# Patient Record
Sex: Male | Born: 1945
Health system: Southern US, Community
[De-identification: ages and names within clinical notes are randomized; demographics above are authoritative.]

## PROBLEM LIST (undated history)

## (undated) DIAGNOSIS — J189 Pneumonia, unspecified organism: Secondary | ICD-10-CM

## (undated) DIAGNOSIS — I219 Acute myocardial infarction, unspecified: Secondary | ICD-10-CM

## (undated) DIAGNOSIS — E785 Hyperlipidemia, unspecified: Secondary | ICD-10-CM

## (undated) DIAGNOSIS — I2510 Atherosclerotic heart disease of native coronary artery without angina pectoris: Secondary | ICD-10-CM

## (undated) DIAGNOSIS — I509 Heart failure, unspecified: Secondary | ICD-10-CM

## (undated) DIAGNOSIS — I255 Ischemic cardiomyopathy: Secondary | ICD-10-CM

## (undated) DIAGNOSIS — M12811 Other specific arthropathies, not elsewhere classified, right shoulder: Secondary | ICD-10-CM

## (undated) DIAGNOSIS — I10 Essential (primary) hypertension: Secondary | ICD-10-CM

## (undated) DIAGNOSIS — M75101 Unspecified rotator cuff tear or rupture of right shoulder, not specified as traumatic: Secondary | ICD-10-CM

## (undated) DIAGNOSIS — M1990 Unspecified osteoarthritis, unspecified site: Secondary | ICD-10-CM

## (undated) DIAGNOSIS — M109 Gout, unspecified: Secondary | ICD-10-CM

## (undated) DIAGNOSIS — R0602 Shortness of breath: Secondary | ICD-10-CM

## (undated) HISTORY — PX: CORONARY STENT PLACEMENT: SHX1402

## (undated) HISTORY — PX: NO PAST SURGERIES: SHX2092

---

## 1967-01-20 HISTORY — PX: CYSTECTOMY: SUR359

## 2000-01-20 HISTORY — PX: OTHER SURGICAL HISTORY: SHX169

## 2000-09-14 ENCOUNTER — Ambulatory Visit (HOSPITAL_COMMUNITY): Admission: RE | Admit: 2000-09-14 | Discharge: 2000-09-14 | Payer: Self-pay | Admitting: Internal Medicine

## 2001-06-30 ENCOUNTER — Ambulatory Visit (HOSPITAL_COMMUNITY): Admission: RE | Admit: 2001-06-30 | Discharge: 2001-06-30 | Payer: Self-pay | Admitting: Orthopaedic Surgery

## 2001-07-14 ENCOUNTER — Ambulatory Visit (HOSPITAL_COMMUNITY): Admission: RE | Admit: 2001-07-14 | Discharge: 2001-07-14 | Payer: Self-pay | Admitting: Orthopaedic Surgery

## 2004-10-27 ENCOUNTER — Ambulatory Visit (HOSPITAL_COMMUNITY): Admission: RE | Admit: 2004-10-27 | Discharge: 2004-10-27 | Payer: Self-pay | Admitting: Pulmonary Disease

## 2004-10-28 ENCOUNTER — Ambulatory Visit (HOSPITAL_COMMUNITY): Admission: RE | Admit: 2004-10-28 | Discharge: 2004-10-28 | Payer: Self-pay | Admitting: Pulmonary Disease

## 2005-07-31 ENCOUNTER — Encounter: Admission: RE | Admit: 2005-07-31 | Discharge: 2005-07-31 | Payer: Self-pay | Admitting: Orthopedic Surgery

## 2010-12-31 ENCOUNTER — Other Ambulatory Visit (HOSPITAL_COMMUNITY): Payer: Self-pay | Admitting: Pulmonary Disease

## 2010-12-31 DIAGNOSIS — I729 Aneurysm of unspecified site: Secondary | ICD-10-CM

## 2011-01-05 ENCOUNTER — Other Ambulatory Visit (HOSPITAL_COMMUNITY): Payer: Self-pay | Admitting: Pulmonary Disease

## 2011-01-05 ENCOUNTER — Ambulatory Visit (HOSPITAL_COMMUNITY)
Admission: RE | Admit: 2011-01-05 | Discharge: 2011-01-05 | Disposition: A | Discharge status: Home / Self Care | Payer: BC Managed Care – PPO | Source: Ambulatory Visit | Attending: Pulmonary Disease | Admitting: Pulmonary Disease

## 2011-01-05 DIAGNOSIS — I714 Abdominal aortic aneurysm, without rupture, unspecified: Secondary | ICD-10-CM | POA: Insufficient documentation

## 2011-01-05 DIAGNOSIS — I729 Aneurysm of unspecified site: Secondary | ICD-10-CM

## 2011-08-20 DIAGNOSIS — I219 Acute myocardial infarction, unspecified: Secondary | ICD-10-CM

## 2011-08-20 HISTORY — DX: Acute myocardial infarction, unspecified: I21.9

## 2011-08-23 ENCOUNTER — Encounter (HOSPITAL_COMMUNITY)
Admission: EM | Disposition: A | Discharge status: Home / Self Care | Payer: Self-pay | Source: Home / Self Care | Attending: Cardiovascular Disease

## 2011-08-23 ENCOUNTER — Encounter (HOSPITAL_COMMUNITY): Payer: Self-pay | Admitting: *Deleted

## 2011-08-23 ENCOUNTER — Other Ambulatory Visit: Payer: Self-pay

## 2011-08-23 ENCOUNTER — Inpatient Hospital Stay (HOSPITAL_COMMUNITY)
Admission: EM | Admit: 2011-08-23 | Discharge: 2011-08-27 | DRG: 550 | Disposition: A | Discharge status: Home / Self Care | Payer: BC Managed Care – PPO | Attending: Cardiovascular Disease | Admitting: Cardiovascular Disease

## 2011-08-23 ENCOUNTER — Emergency Department (HOSPITAL_COMMUNITY): Payer: BC Managed Care – PPO

## 2011-08-23 ENCOUNTER — Encounter (HOSPITAL_COMMUNITY): Payer: Self-pay

## 2011-08-23 ENCOUNTER — Ambulatory Visit (HOSPITAL_COMMUNITY): Admit: 2011-08-23 | Payer: Self-pay | Admitting: Cardiovascular Disease

## 2011-08-23 DIAGNOSIS — I2119 ST elevation (STEMI) myocardial infarction involving other coronary artery of inferior wall: Secondary | ICD-10-CM

## 2011-08-23 DIAGNOSIS — I10 Essential (primary) hypertension: Secondary | ICD-10-CM | POA: Diagnosis present

## 2011-08-23 DIAGNOSIS — I2589 Other forms of chronic ischemic heart disease: Secondary | ICD-10-CM | POA: Diagnosis present

## 2011-08-23 DIAGNOSIS — Z7982 Long term (current) use of aspirin: Secondary | ICD-10-CM

## 2011-08-23 DIAGNOSIS — I2510 Atherosclerotic heart disease of native coronary artery without angina pectoris: Secondary | ICD-10-CM

## 2011-08-23 DIAGNOSIS — I213 ST elevation (STEMI) myocardial infarction of unspecified site: Secondary | ICD-10-CM

## 2011-08-23 DIAGNOSIS — Z87891 Personal history of nicotine dependence: Secondary | ICD-10-CM

## 2011-08-23 DIAGNOSIS — M109 Gout, unspecified: Secondary | ICD-10-CM | POA: Diagnosis present

## 2011-08-23 DIAGNOSIS — Z955 Presence of coronary angioplasty implant and graft: Secondary | ICD-10-CM

## 2011-08-23 DIAGNOSIS — I252 Old myocardial infarction: Secondary | ICD-10-CM | POA: Diagnosis present

## 2011-08-23 DIAGNOSIS — I2109 ST elevation (STEMI) myocardial infarction involving other coronary artery of anterior wall: Principal | ICD-10-CM | POA: Diagnosis present

## 2011-08-23 DIAGNOSIS — Z79899 Other long term (current) drug therapy: Secondary | ICD-10-CM

## 2011-08-23 DIAGNOSIS — E785 Hyperlipidemia, unspecified: Secondary | ICD-10-CM | POA: Diagnosis present

## 2011-08-23 DIAGNOSIS — J189 Pneumonia, unspecified organism: Secondary | ICD-10-CM | POA: Diagnosis not present

## 2011-08-23 HISTORY — DX: Hyperlipidemia, unspecified: E78.5

## 2011-08-23 HISTORY — DX: Gout, unspecified: M10.9

## 2011-08-23 HISTORY — DX: Ischemic cardiomyopathy: I25.5

## 2011-08-23 HISTORY — PX: PERCUTANEOUS CORONARY STENT INTERVENTION (PCI-S): SHX5485

## 2011-08-23 HISTORY — DX: Atherosclerotic heart disease of native coronary artery without angina pectoris: I25.10

## 2011-08-23 HISTORY — DX: Essential (primary) hypertension: I10

## 2011-08-23 HISTORY — PX: LEFT HEART CATH: SHX5478

## 2011-08-23 HISTORY — DX: Pneumonia, unspecified organism: J18.9

## 2011-08-23 LAB — CARDIAC PANEL(CRET KIN+CKTOT+MB+TROPI)
CK, MB: 250 ng/mL (ref 0.3–4.0)
CK, MB: 244.2 ng/mL (ref 0.3–4.0)
Relative Index: 5.1 — ABNORMAL HIGH (ref 0.0–2.5)
Total CK: 5484 U/L — ABNORMAL HIGH (ref 7–232)
Total CK: 4829 U/L — ABNORMAL HIGH (ref 7–232)
Troponin I: >20 ng/mL

## 2011-08-23 LAB — CBC WITH DIFFERENTIAL/PLATELET
Basophils Absolute: 0 10*3/uL (ref 0.0–0.1)
Eosinophils Absolute: 0 10*3/uL (ref 0.0–0.7)
HCT: 45.2 % (ref 39.0–52.0)
Lymphs Abs: 0.9 10*3/uL (ref 0.7–4.0)
MCH: 32.1 pg (ref 26.0–34.0)
MCHC: 35.2 g/dL (ref 30.0–36.0)
MCV: 91.1 fL (ref 78.0–100.0)
Monocytes Absolute: 0.9 10*3/uL (ref 0.1–1.0)
Monocytes Relative: 7 % (ref 3–12)
Neutro Abs: 11.3 10*3/uL — ABNORMAL HIGH (ref 1.7–7.7)
Platelets: 143 10*3/uL — ABNORMAL LOW (ref 150–400)
RDW: 13.2 % (ref 11.5–15.5)
WBC: 13.1 10*3/uL — ABNORMAL HIGH (ref 4.0–10.5)

## 2011-08-23 LAB — COMPREHENSIVE METABOLIC PANEL WITH GFR
ALT: 88 U/L — ABNORMAL HIGH (ref 0–53)
AST: 354 U/L — ABNORMAL HIGH (ref 0–37)
Albumin: 3.7 g/dL (ref 3.5–5.2)
Alkaline Phosphatase: 77 U/L (ref 39–117)
BUN: 17 mg/dL (ref 6–23)
CO2: 23 meq/L (ref 19–32)
Calcium: 8.2 mg/dL — ABNORMAL LOW (ref 8.4–10.5)
Chloride: 104 meq/L (ref 96–112)
Creatinine, Ser: 0.87 mg/dL (ref 0.50–1.35)
GFR calc Af Amer: >90 mL/min
GFR calc non Af Amer: 89 mL/min — ABNORMAL LOW
Glucose, Bld: 171 mg/dL — ABNORMAL HIGH (ref 70–99)
Potassium: 3.9 meq/L (ref 3.5–5.1)
Sodium: 138 meq/L (ref 135–145)
Total Bilirubin: 0.7 mg/dL (ref 0.3–1.2)
Total Protein: 6.4 g/dL (ref 6.0–8.3)

## 2011-08-23 LAB — BASIC METABOLIC PANEL
BUN: 18 mg/dL (ref 6–23)
CO2: 25 mEq/L (ref 19–32)
Calcium: 9 mg/dL (ref 8.4–10.5)
Creatinine, Ser: 1.06 mg/dL (ref 0.50–1.35)
GFR calc non Af Amer: 72 mL/min — ABNORMAL LOW (ref >90)
Glucose, Bld: 123 mg/dL — ABNORMAL HIGH (ref 70–99)
Potassium: 3.7 mEq/L (ref 3.5–5.1)
Sodium: 138 mEq/L (ref 135–145)

## 2011-08-23 LAB — PROTIME-INR
INR: 1.32 (ref 0.00–1.49)
Prothrombin Time: 16.6 s — ABNORMAL HIGH (ref 11.6–15.2)

## 2011-08-23 LAB — CBC
HCT: 46 % (ref 39.0–52.0)
Hemoglobin: 16.5 g/dL (ref 13.0–17.0)
MCH: 32.8 pg (ref 26.0–34.0)
MCHC: 35.9 g/dL (ref 30.0–36.0)
MCV: 91.5 fL (ref 78.0–100.0)
Platelets: 153 10*3/uL (ref 150–400)
RBC: 5.03 MIL/uL (ref 4.22–5.81)
RDW: 13.2 % (ref 11.5–15.5)
WBC: 8 10*3/uL (ref 4.0–10.5)

## 2011-08-23 LAB — MRSA PCR SCREENING: MRSA by PCR: POSITIVE — AB

## 2011-08-23 LAB — HEMOGLOBIN A1C
Hgb A1c MFr Bld: 5.4 % (ref <5.7)
Mean Plasma Glucose: 108 mg/dL (ref <117)

## 2011-08-23 LAB — POCT I-STAT TROPONIN I

## 2011-08-23 LAB — TSH: TSH: 1.103 u[IU]/mL (ref 0.350–4.500)

## 2011-08-23 SURGERY — LEFT HEART CATH
Anesthesia: LOCAL

## 2011-08-23 MED ORDER — METOPROLOL TARTRATE 50 MG PO TABS
25.0000 mg | ORAL_TABLET | Freq: Once | ORAL | Status: AC
  Administered 2011-08-23: 25 mg via ORAL
  Filled 2011-08-23: qty 1

## 2011-08-23 MED ORDER — FENTANYL CITRATE 0.05 MG/ML IJ SOLN
INTRAMUSCULAR | Status: AC
  Filled 2011-08-23: qty 2

## 2011-08-23 MED ORDER — ATORVASTATIN CALCIUM 40 MG PO TABS: 40.0000 mg | ORAL_TABLET | Freq: Every day | ORAL | Status: DC

## 2011-08-23 MED ORDER — ALLOPURINOL 300 MG PO TABS
300.0000 mg | ORAL_TABLET | Freq: Every day | ORAL | Status: DC
  Administered 2011-08-23 – 2011-08-27 (×5): 300 mg via ORAL
  Filled 2011-08-23 (×5): qty 1

## 2011-08-23 MED ORDER — HEPARIN (PORCINE) IN NACL 2-0.9 UNIT/ML-% IJ SOLN
INTRAMUSCULAR | Status: AC
  Filled 2011-08-23: qty 2000

## 2011-08-23 MED ORDER — MIDAZOLAM HCL 2 MG/2ML IJ SOLN
INTRAMUSCULAR | Status: AC
  Filled 2011-08-23: qty 2

## 2011-08-23 MED ORDER — ONDANSETRON HCL 4 MG/2ML IJ SOLN: 4.0000 mg | Freq: Four times a day (QID) | INTRAMUSCULAR | Status: DC | PRN

## 2011-08-23 MED ORDER — LISINOPRIL 10 MG PO TABS
10.0000 mg | ORAL_TABLET | Freq: Every day | ORAL | Status: DC
  Administered 2011-08-23 – 2011-08-27 (×5): 10 mg via ORAL
  Filled 2011-08-23 (×5): qty 1

## 2011-08-23 MED ORDER — CARVEDILOL 3.125 MG PO TABS
3.1250 mg | ORAL_TABLET | Freq: Two times a day (BID) | ORAL | Status: DC
  Administered 2011-08-23 – 2011-08-27 (×8): 3.125 mg via ORAL
  Filled 2011-08-23 (×10): qty 1

## 2011-08-23 MED ORDER — MORPHINE SULFATE 2 MG/ML IJ SOLN
2.0000 mg | Freq: Once | INTRAMUSCULAR | Status: AC
  Administered 2011-08-23: 2 mg via INTRAVENOUS

## 2011-08-23 MED ORDER — CHLORHEXIDINE GLUCONATE CLOTH 2 % EX PADS
6.0000 | MEDICATED_PAD | Freq: Every day | CUTANEOUS | Status: DC
  Administered 2011-08-24 – 2011-08-27 (×4): 6 via TOPICAL

## 2011-08-23 MED ORDER — ASPIRIN 325 MG PO TABS: 325.0000 mg | ORAL_TABLET | Freq: Every day | ORAL | Status: DC

## 2011-08-23 MED ORDER — NITROGLYCERIN IN D5W 200-5 MCG/ML-% IV SOLN
5.0000 ug/min | Freq: Once | INTRAVENOUS | Status: AC
  Administered 2011-08-23: 05:00:00 via INTRAVENOUS

## 2011-08-23 MED ORDER — ACETAMINOPHEN 325 MG PO TABS: 650.0000 mg | ORAL_TABLET | ORAL | Status: DC | PRN

## 2011-08-23 MED ORDER — MORPHINE SULFATE 2 MG/ML IJ SOLN
INTRAMUSCULAR | Status: AC
  Filled 2011-08-23: qty 1

## 2011-08-23 MED ORDER — HEPARIN BOLUS VIA INFUSION
4000.0000 [IU] | Freq: Once | INTRAVENOUS | Status: AC
  Administered 2011-08-23: 4000 [IU] via INTRAVENOUS

## 2011-08-23 MED ORDER — ONDANSETRON HCL 4 MG/2ML IJ SOLN
INTRAMUSCULAR | Status: AC
  Administered 2011-08-23: 4 mg
  Filled 2011-08-23: qty 2

## 2011-08-23 MED ORDER — NITROGLYCERIN 0.4 MG SL SUBL
0.4000 mg | SUBLINGUAL_TABLET | SUBLINGUAL | Status: DC | PRN
  Administered 2011-08-23 (×2): 0.4 mg via SUBLINGUAL
  Filled 2011-08-23: qty 25

## 2011-08-23 MED ORDER — PRASUGREL HCL 10 MG PO TABS
10.0000 mg | ORAL_TABLET | Freq: Every day | ORAL | Status: DC
  Administered 2011-08-23 – 2011-08-27 (×5): 10 mg via ORAL
  Filled 2011-08-23 (×5): qty 1

## 2011-08-23 MED ORDER — SODIUM CHLORIDE 0.45 % IV SOLN
INTRAVENOUS | Status: AC
  Administered 2011-08-23: 12:00:00 via INTRAVENOUS

## 2011-08-23 MED ORDER — ASPIRIN 81 MG PO CHEW
324.0000 mg | CHEWABLE_TABLET | Freq: Once | ORAL | Status: AC
  Administered 2011-08-23: 324 mg via ORAL
  Filled 2011-08-23: qty 4

## 2011-08-23 MED ORDER — MORPHINE SULFATE 2 MG/ML IJ SOLN
INTRAMUSCULAR | Status: AC
  Administered 2011-08-23: 2 mg via INTRAVENOUS
  Filled 2011-08-23: qty 1

## 2011-08-23 MED ORDER — MUPIROCIN 2 % EX OINT
1.0000 "application " | TOPICAL_OINTMENT | Freq: Two times a day (BID) | CUTANEOUS | Status: DC
  Administered 2011-08-24 – 2011-08-26 (×7): 1 via NASAL
  Filled 2011-08-23: qty 22

## 2011-08-23 MED ORDER — PRASUGREL HCL 10 MG PO TABS
ORAL_TABLET | ORAL | Status: AC
  Administered 2011-08-23: 10 mg via ORAL
  Filled 2011-08-23: qty 6

## 2011-08-23 MED ORDER — LIDOCAINE HCL (PF) 1 % IJ SOLN
INTRAMUSCULAR | Status: AC
  Filled 2011-08-23: qty 30

## 2011-08-23 MED ORDER — ONDANSETRON HCL 4 MG/2ML IJ SOLN
INTRAMUSCULAR | Status: AC
  Administered 2011-08-23: 4 mg via INTRAVENOUS
  Filled 2011-08-23: qty 2

## 2011-08-23 MED ORDER — NITROGLYCERIN 0.4 MG SL SUBL: 0.4000 mg | SUBLINGUAL_TABLET | SUBLINGUAL | Status: DC | PRN

## 2011-08-23 MED ORDER — ACETAMINOPHEN 325 MG PO TABS
650.0000 mg | ORAL_TABLET | ORAL | Status: DC | PRN
  Administered 2011-08-23 – 2011-08-26 (×5): 650 mg via ORAL
  Filled 2011-08-23 (×5): qty 2

## 2011-08-23 MED ORDER — NITROGLYCERIN 0.2 MG/ML ON CALL CATH LAB
INTRAVENOUS | Status: AC
  Filled 2011-08-23: qty 1

## 2011-08-23 MED ORDER — NITROGLYCERIN IN D5W 200-5 MCG/ML-% IV SOLN
INTRAVENOUS | Status: AC
  Filled 2011-08-23: qty 250

## 2011-08-23 MED ORDER — NITROGLYCERIN 0.4 MG SL SUBL
SUBLINGUAL_TABLET | SUBLINGUAL | Status: AC
  Administered 2011-08-23: 0.4 mg via SUBLINGUAL
  Filled 2011-08-23: qty 25

## 2011-08-23 MED ORDER — CLOPIDOGREL BISULFATE 300 MG PO TABS
300.0000 mg | ORAL_TABLET | Freq: Once | ORAL | Status: AC
  Administered 2011-08-23: 300 mg via ORAL
  Filled 2011-08-23: qty 1

## 2011-08-23 MED ORDER — ATORVASTATIN CALCIUM 80 MG PO TABS
80.0000 mg | ORAL_TABLET | Freq: Every day | ORAL | Status: DC
Start: 2011-08-23 — End: 2011-08-24
  Administered 2011-08-23: 80 mg via ORAL
  Filled 2011-08-23 (×2): qty 1

## 2011-08-23 MED ORDER — HEPARIN (PORCINE) IN NACL 100-0.45 UNIT/ML-% IJ SOLN
10.0000 [IU]/kg/h | Freq: Once | INTRAMUSCULAR | Status: AC
  Administered 2011-08-23: 10 [IU]/kg/h via INTRAVENOUS
  Filled 2011-08-23: qty 250

## 2011-08-23 MED ORDER — BIVALIRUDIN 250 MG IV SOLR
INTRAVENOUS | Status: AC
  Filled 2011-08-23: qty 250

## 2011-08-23 MED ORDER — ASPIRIN 81 MG PO CHEW
81.0000 mg | CHEWABLE_TABLET | Freq: Every day | ORAL | Status: DC
  Administered 2011-08-23 – 2011-08-27 (×5): 81 mg via ORAL
  Filled 2011-08-23 (×4): qty 1

## 2011-08-23 MED ORDER — ONDANSETRON HCL 4 MG/2ML IJ SOLN
4.0000 mg | Freq: Four times a day (QID) | INTRAMUSCULAR | Status: DC | PRN
  Administered 2011-08-23: 4 mg via INTRAVENOUS
  Filled 2011-08-23: qty 2

## 2011-08-23 NOTE — ED Provider Notes (Addendum)
History     CSN: [id number]  Arrival date & time 08/23/11  0445   First MD Initiated Contact with Patient 08/23/11 [phone]      Chief Complaint  Patient presents with  . Chest Pain    (Consider location/radiation/quality/duration/timing/severity/associated sxs/prior treatment) HPI Austin Bright is a 66 y.o. male who presents to the Emergency Department complaining of chest pain that radiates to his neck then to his head that has been present for a month with exertion. With rest it would subside. In the last two weeks pain with minimal exertion. Tonight went to bed at 2300. Every two hours pain would come, upper chest with radiation to neck and head. Each time seemed more severe. Last ASA taken yesterday morning.   PCP Dr. Juanetta Gosling   Past Medical History  Diagnosis Date  . Gout     History reviewed. No pertinent past surgical history.  History reviewed. No pertinent family history.  History  Substance Use Topics  . Smoking status: Former Games developer  . Smokeless tobacco: Not on file  . Alcohol Use: Yes     occasional      Review of Systems  Constitutional: Negative for fever.       10 Systems reviewed and are negative for acute change except as noted in the HPI.  HENT: Negative for congestion.   Eyes: Negative for discharge and redness.  Respiratory: Negative for cough and shortness of breath.   Cardiovascular: Positive for chest pain.  Gastrointestinal: Negative for vomiting and abdominal pain.  Musculoskeletal: Negative for back pain.  Skin: Negative for rash.  Neurological: Negative for syncope, numbness and headaches.  Psychiatric/Behavioral:       No behavior change.    Allergies  Review of patient's allergies indicates no known allergies.  Home Medications   Current Outpatient Rx  Name Route Sig Dispense Refill  . ALLOPURINOL 300 MG PO TABS Oral Take 300 mg by mouth daily.    . ASPIRIN 325 MG PO TABS Oral Take 325 mg by mouth daily.      BP 165/107   Temp 98 F (36.7 C) (Oral)  Resp 18  Ht 5\' 10"  (1.778 m)  Wt 220 lb (99.791 kg)  BMI 31.57 kg/m2  SpO2 97%  Physical Exam  Nursing note and vitals reviewed. Constitutional: He is oriented to person, place, and time. He appears well-developed and well-nourished. No distress.       Awake, alert, nontoxic appearance.  HENT:  Head: Normocephalic and atraumatic.  Right Ear: External ear normal.  Left Ear: External ear normal.  Mouth/Throat: Oropharynx is clear and moist.  Eyes: Conjunctivae and EOM are normal. Pupils are equal, round, and reactive to light. Right eye exhibits no discharge. Left eye exhibits no discharge.  Neck: Normal range of motion. Neck supple. No JVD present.  Cardiovascular: Normal rate and normal heart sounds.   Pulmonary/Chest: Effort normal and breath sounds normal. He exhibits no tenderness.  Abdominal: Soft. Bowel sounds are normal. There is no tenderness. There is no rebound.  Musculoskeletal: Normal range of motion. He exhibits no tenderness.       Baseline ROM, no obvious new focal weakness.  Neurological: He is alert and oriented to person, place, and time.       Mental status and motor strength appears baseline for patient and situation.  Skin: No rash noted.  Psychiatric: He has a normal mood and affect.    ED Course  Procedures (including critical care time)  Results for  orders placed during the hospital encounter of 08/23/11  CBC      Component Value Range   WBC 8.0  4.0 - 10.5 K/uL   RBC 5.03  4.22 - 5.81 MIL/uL   Hemoglobin 16.5  13.0 - 17.0 g/dL   HCT 16.1  09.6 - 04.5 %   MCV 91.5  78.0 - 100.0 fL   MCH 32.8  26.0 - 34.0 pg   MCHC 35.9  30.0 - 36.0 g/dL   RDW 40.9  81.1 - 91.4 %   Platelets 153  150 - 400 K/uL  BASIC METABOLIC PANEL      Component Value Range   Sodium 138  135 - 145 mEq/L   Potassium 3.7  3.5 - 5.1 mEq/L   Chloride 101  96 - 112 mEq/L   CO2 25  19 - 32 mEq/L   Glucose, Bld 123 (*) 70 - 99 mg/dL   BUN 18  6 - 23  mg/dL   Creatinine, Ser 7.82  0.50 - 1.35 mg/dL   Calcium 9.0  8.4 - 95.6 mg/dL   GFR calc non Af Amer 72 (*) >90 mL/min   GFR calc Af Amer 84 (*) >90 mL/min  TROPONIN I      Component Value Range   Troponin I <0.30  <0.30 ng/mL  POCT I-STAT TROPONIN I      Component Value Range   Troponin i, poc 0.07  0.00 - 0.08 ng/mL   Comment 3             Chest Portable 1 View  08/23/2011  *RADIOLOGY REPORT*  Clinical Data: Upper chest pain radiating into the head.  PORTABLE CHEST - 1 VIEW  Comparison: None.  Findings: Mild cardiac enlargement.  Pulmonary vascularity is normal but there is interstitial change throughout the lungs which could represent fibrosis or edema.  No focal consolidation.  No blunting of costophrenic angles.  No pneumothorax.  Calcification of the aorta.  IMPRESSION: Diffuse interstitial process suggesting either chronic fibrosis or acute edema.  Mild cardiac enlargement.  Original Report Authenticated By: Marlon Pel, M.D.   Date: 08/23/2011 0447  Rate: 72  Rhythm: normal sinus rhythm  QRS Axis: normal  Intervals: normal  ST/T Wave abnormalities: ST elevations anteriorly  Conduction Disutrbances:left anterior fascicular block  Narrative Interpretation:   Old EKG Reviewed: none available  0504 Code STEMI called 0510  T/C toDr. Kirke Corin, cardiologist, case discussed, including:  HPI, pertinent PM/SHx, VS/PE, dx testing, ED course and treatment.  Agreeable to transfer directly to the cath lab.Marland Kitchen    MDM  Patient with exertional angina for a month developed at rest angina tonight. EKG with ST elevated in anterior leads. Initiated Code STEMI protocol. IV NTG, Heparin ordered, ASA given, metoprolol and plaxix ordered, morphine for pain. Spoke with Dr. Kirke Corin, cardiologist who will accept the patient in transfer to [facility].Pt stable in ED with no significant deterioration in condition. The patient appears reasonably stabilized for transfer considering the current resources, flow, and  capabilities available in the ED at this time, and I doubt any other [facility] requiring further screening and/or treatment in the ED prior to transfer.  MDM Reviewed: nursing note and vitals Interpretation: ECG and x-ray Total time providing critical care: 30 minutes. Consults: cardiology           Nicoletta Dress. Colon Branch, MD 08/23/11 2130  Nicoletta Dress. Colon Branch, MD 08/23/11 [phone]

## 2011-08-23 NOTE — ED Notes (Signed)
Report given to [facility]  

## 2011-08-23 NOTE — ED Notes (Signed)
Pt reporting pain in upper mid portion of chest into head.  Denies nausea or vomiting. Reports symptoms have been intermittent for about 1 week, worse tonight.

## 2011-08-23 NOTE — H&P (Signed)
Admit date: 08/23/2011 Referring Physician  Dr. Colon Branch Primary Cardiologist  None Chief complaint/reason for admission: Chest pain  HPI:  Austin Bright is a 66 y.o. male who presented to the Emergency Department complaining of chest pain that radiates to his neck then to his head that has been occuring intermittently for a month with exertion. With rest it would subside. In the last two weeks he has had  pain with minimal exertion. Tonight he went to bed at 2300. Every two hours pain would come, upper chest with radiation to neck and head. Each time seemed more severe.  The pain then became unremitting around 4am awakening him from sleep.   In the ER he was noted to have anterior ST elevation and is now transferred to [facility] for emergent cath.   PMH:    Past Medical History  Diagnosis Date   Hypertension   . Gout      PSH:   History reviewed. No pertinent past surgical history.  ALLERGIES:   Review of patient's allergies indicates no known allergies.  Prior to Admit Meds:   Prescriptions prior to admission  Medication Sig Dispense Refill  . allopurinol (ZYLOPRIM) 300 MG tablet Take 300 mg by mouth daily.      Marland Kitchen aspirin 325 MG tablet Take 325 mg by mouth daily.       Family HX:   History reviewed. No pertinent family history. Social HX:    History   Social History  . Marital Status: Divorced    Spouse Name: N/A    Number of Children: N/A  . Years of Education: N/A   Occupational History  . Not on file.   Social History Main Topics  . Smoking status: Former Games developer  . Smokeless tobacco: Not on file  . Alcohol Use: Yes     occasional  . Drug Use: No  . Sexually Active:    Other Topics Concern  . Not on file   Social History Narrative  . No narrative on file     ROS:  All 11 ROS were addressed and are negative except what is stated in the HPI  PHYSICAL EXAM Filed Vitals:   08/23/11 0544  BP: 146/97  Temp:   Resp:    General: Well developed, well nourished, in no  acute distress Head: Eyes PERRLA, No xanthomas.   Normal cephalic and atramatic  Lungs:   Clear bilaterally to auscultation and percussion. Heart:   HRRR S1 S2 Pulses are 2+ & equal.            No carotid bruit. No JVD.  No abdominal bruits. No femoral bruits. Abdomen: Bowel sounds are positive, abdomen soft and non-tender without masses  Extremities:   No clubbing, cyanosis or edema.  DP +1 Neuro: Alert and oriented X 3. Psych:  Good affect, responds appropriately   Labs:   Lab Results  Component Value Date   WBC 8.0 08/23/2011   HGB 16.5 08/23/2011   HCT 46.0 08/23/2011   MCV 91.5 08/23/2011   PLT 153 08/23/2011    Lab 08/23/11 0458  NA 138  K 3.7  CL 101  CO2 25  BUN 18  CREATININE 1.06  CALCIUM 9.0  PROT --  BILITOT --  ALKPHOS --  ALT --  AST --  GLUCOSE 123*   Lab Results  Component Value Date   TROPONINI <0.30 08/23/2011     Radiology:  *RADIOLOGY REPORT*  Clinical Data: Upper chest pain radiating into the head.  PORTABLE CHEST - 1 VIEW  Comparison: None.  Findings: Mild cardiac enlargement. Pulmonary vascularity is  normal but there is interstitial change throughout the lungs which  could represent fibrosis or edema. No focal consolidation. No  blunting of costophrenic angles. No pneumothorax. Calcification  of the aorta.  IMPRESSION:  Diffuse interstitial process suggesting either chronic fibrosis or  acute edema. Mild cardiac enlargement.  Original Report Authenticated By: Marlon Pel, M.D.   EKG:  NSR with 3mm ST elevation in V2-V4 and 1mm ST elevation in I and aVL   ASSESSMENT:  1.  Acute STEMI - anterior 2.  HTN 3.  Gout  PLAN:   1.  Admit to CCU 2.  Emergent cardiac Cath by Dr. Chinita Pester, MD  08/23/2011  6:21 AM

## 2011-08-23 NOTE — ED Notes (Signed)
[facility] to department to transport pt.  

## 2011-08-23 NOTE — CV Procedure (Signed)
Cardiac Catheterization Procedure Note  Name: Austin Bright MRN: [id number] DOB: 03-08-1945  Procedure: Left Heart Cath, Selective Coronary Angiography, LV angiography,  thrombectomy /PTCA/Stent of mid LAD with a drug-eluting stent.  Indication: Inferior ST elevation myocardial infarction. This is a 66 year old male with no previous cardiac history. He presented with chest pain that woke him up from sleep at 4:00 this morning. He was noted to have anterior ST elevation on his ECG and was transferred for emergent cardiac catheterization. He reports exertional chest discomfort over the last 10 days.   Medications:  Sedation:  1 mg IV Versed, 125 mcg IV Fentanyl  Contrast:  220 ml Omnipaque  Diagnostic Procedure Details: The right groin was prepped, draped, and anesthetized with 1% lidocaine. Using the modified Seldinger technique, a 6 French sheath was introduced into the right femoral artery. I elected to start with interventional procedure on started with the guiding catheter. After the PCI, right coronary angiography was performed with a JR 4 catheter and left ventricular angiography was performed with a pigtail catheter.  Catheter exchanges were performed over a wire.    PCI Procedure Note:   Weight-based bivalirudin was given for anticoagulation. Effient 60 mg was given. A 6 French XB 3.5 guide catheter was inserted.  A intuition coronary guidewire was used to cross the lesion.  Due to large thrombus noted, I started with thrombectomy with 2 passes performed. Large amount of thrombus was retrieved but there was still TIMI 0 flow. The lesion was predilated with a 2.5 x 12 balloon.  The lesion was then stented with a 3.5 x 15 Xience expedition stent.  The stent was postdilated with a  4.0 noncompliant balloon.  Following PCI, there was 0% residual stenosis and TIMI-2 flow due to slow flow which was suspected to be due to distal embolization. Thus, large amount of intracoronary adenosine was  given and repeated doses first through the guide. I then elected to use an over the wire balloon which was placed in the mid to distal LAD. Adenosine was given then through the balloon and repeated doses until the final flow improved. The patient continued to have residual anterior ST elevation which gradually improved.. Final angiography confirmed an excellent result but still with slightly sluggish flow. Femoral hemostasis was achieved with Perclose device.  The patient tolerated the PCI procedure well. There were no immediate procedural complications.  The patient was transferred to the post catheterization recovery area for further monitoring.   Procedural Findings:  Hemodynamics: AO:  124/82   mmHg LV:  123/19    mmHg LVEDP: 30  mmHg  Coronary angiography: Coronary dominance: Right   Left Main:  Normal in size with no significant disease.  Left Anterior Descending (LAD):  Large in size with 100% thrombotic occlusion in the midsegment before the first diagonal. Large thrombus is noted with TIMI 0 flow. The rest of the LAD has only minor irregularities.  1st diagonal (D1):  Normal in size and free of significant disease.  2nd diagonal (D2):  Medium size 50% ostial disease.  3rd diagonal (D3):  Very small in size.  Circumflex (LCx):  Large in size and nondominant. The vessel has minor irregularities.  1st obtuse marginal:  Medium in size without significant disease.  2nd obtuse marginal:  Normal in size with minor irregularities.  3rd obtuse marginal:  Large in size without significant disease.   Right Coronary Artery: Normal in size and dominant. There is 10% proximal stenosis. The rest of  the vessel has minor irregularities.  posterior descending artery: Normal in size and free of significant disease.  posterior lateral branch:  Overall small in size branches.  RV marginal is relatively large and free of significant disease.  Left ventriculography: Left ventricular systolic  function is moderately reduced , LVEF is estimated at 35 %, there is no significant mitral regurgitation . Severe mid to distal anterior wall, apical and distal inferior wall hypokinesis.   PCI Data: Vessel - mid LAD/Segment - 13 Percent Stenosis (pre)  100% TIMI-flow 1 Stent 3.5 x 15 Xience drug-eluting stent postdilated with a 4.0 noncompliant balloon Percent Stenosis (post) 0% TIMI-flow (post) 3  Final Conclusions:  1. Anterior ST elevation myocardial infarction due to thrombotic occlusion of the mid LAD with a large thrombus burden. No significant CAD otherwise. 2. Moderately reduced LV systolic function with moderately elevated LVEDP. 3. Successful thrombectomy, angioplasty and drug-eluting stent placement to the mid LAD. 4. Slow flow in the LAD likely due to distal embolization which was treated successfully with intracoronary adenosine. There was still residual ST elevation at the end of the case. However, the patient was chest pain-free at this point.  Recommendations:  Recommend dual antiplatelet therapy for at least one year. Initiate treatment for CAD and cardiomyopathy. Aggressive treatment of risk factors is recommended.  Lorine Bears MD, [facility] 08/23/2011, 7:44 AM

## 2011-08-24 ENCOUNTER — Other Ambulatory Visit: Payer: Self-pay

## 2011-08-24 ENCOUNTER — Inpatient Hospital Stay (HOSPITAL_COMMUNITY): Payer: BC Managed Care – PPO

## 2011-08-24 DIAGNOSIS — I219 Acute myocardial infarction, unspecified: Secondary | ICD-10-CM

## 2011-08-24 DIAGNOSIS — I252 Old myocardial infarction: Secondary | ICD-10-CM | POA: Diagnosis present

## 2011-08-24 DIAGNOSIS — E785 Hyperlipidemia, unspecified: Secondary | ICD-10-CM

## 2011-08-24 DIAGNOSIS — I2510 Atherosclerotic heart disease of native coronary artery without angina pectoris: Secondary | ICD-10-CM

## 2011-08-24 LAB — BASIC METABOLIC PANEL
BUN: 17 mg/dL (ref 6–23)
BUN: 17 mg/dL (ref 6–23)
Calcium: 8.5 mg/dL (ref 8.4–10.5)
Chloride: 104 mEq/L (ref 96–112)
Creatinine, Ser: 0.96 mg/dL (ref 0.50–1.35)
GFR calc Af Amer: 90 mL/min — ABNORMAL LOW (ref >90)
GFR calc non Af Amer: 86 mL/min — ABNORMAL LOW (ref >90)
Glucose, Bld: 126 mg/dL — ABNORMAL HIGH (ref 70–99)
Potassium: 3.9 mEq/L (ref 3.5–5.1)
Potassium: 4.7 mEq/L (ref 3.5–5.1)

## 2011-08-24 LAB — CBC
HCT: 43.4 % (ref 39.0–52.0)
Hemoglobin: 15.1 g/dL (ref 13.0–17.0)
Hemoglobin: 15.3 g/dL (ref 13.0–17.0)
MCH: 32.6 pg (ref 26.0–34.0)
MCHC: 35.2 g/dL (ref 30.0–36.0)
MCHC: 35.3 g/dL (ref 30.0–36.0)
RDW: 13.4 % (ref 11.5–15.5)
WBC: 11.5 10*3/uL — ABNORMAL HIGH (ref 4.0–10.5)

## 2011-08-24 LAB — CARDIAC PANEL(CRET KIN+CKTOT+MB+TROPI)
Relative Index: 4.7 — ABNORMAL HIGH (ref 0.0–2.5)
Total CK: 3575 U/L — ABNORMAL HIGH (ref 7–232)
Troponin I: >20 ng/mL (ref <0.30)

## 2011-08-24 LAB — LIPID PANEL
Cholesterol: 169 mg/dL (ref 0–200)
VLDL: 25 mg/dL (ref 0–40)

## 2011-08-24 MED ORDER — ROSUVASTATIN CALCIUM 40 MG PO TABS
40.0000 mg | ORAL_TABLET | Freq: Every day | ORAL | Status: DC
  Administered 2011-08-24 – 2011-08-27 (×4): 40 mg via ORAL
  Filled 2011-08-24 (×4): qty 1

## 2011-08-24 MED ORDER — NON FORMULARY: 40.0000 mg | Freq: Every morning | Status: DC

## 2011-08-24 MED FILL — Dextrose Inj 5%: INTRAVENOUS | Qty: 50 | Status: AC

## 2011-08-24 NOTE — Care Management Note (Signed)
    Page 1 of 1   08/24/2011     9:04:32 AM   CARE MANAGEMENT NOTE 08/24/2011  Patient:  Austin Bright, Austin Bright   Account Number:  [id number]  Date Initiated:  08/24/2011  Documentation initiated by:  Junius Creamer  Subjective/Objective Assessment:   adm w mi     Action/Plan:   lives w fam, pcp dr Kari Baars   Anticipated DC Date:     Anticipated DC Plan:  HOME/SELF CARE      DC Planning Services  CM consult      Choice offered to / List presented to:             Status of service:   Medicare Important Message given?   (If response is "NO", the following Medicare IM given date fields will be blank) Date Medicare IM given:   Date Additional Medicare IM given:    Discharge Disposition:    Per UR Regulation:  Reviewed for med. necessity/level of care/duration of stay  If discussed at Long Length of Stay Meetings, dates discussed:    Comments:  8/5 9a debbie Amay Mijangos rn,bsn [phone]

## 2011-08-24 NOTE — Progress Notes (Signed)
CARDIAC REHAB PHASE I   PRE:  Rate/Rhythm: 86SR  BP:  Supine: 110/71  Sitting:   Standing:    SaO2: 94%2L  MODE:  Ambulation: 350 ft   POST:  Rate/Rhythem: 114ST  BP:  Supine:   Sitting: 107/62  Standing:    SaO2: 96%RA 1010-1115 Pt walked 350 ft on RA with handheld asst. With steady gait. Denied chest pain. C/o feeling slightly lightheaded during walk. To recliner after walk. Call bell in reach. Education completed with pt. Permission given to refer to [place] Phase 2.  Austin Bright

## 2011-08-24 NOTE — Progress Notes (Signed)
*  PRELIMINARY RESULTS* Echocardiogram 2D Echocardiogram has been performed.  Austin Bright 08/24/2011, 9:48 AM

## 2011-08-24 NOTE — Progress Notes (Signed)
Subjective: Patient denies CP  No SOB  Just tired. Objective: Filed Vitals:   08/24/11 0500 08/24/11 0600 08/24/11 0700 08/24/11 0800  BP: 98/68 95/68 106/66 104/66  Pulse: 80 81 84 88  Temp:    99.9 F (37.7 C)  TempSrc:    Oral  Resp: 20 21 15 19   Height:      Weight:      SpO2: 94% 94% 94% 95%   Weight change:   Intake/Output Summary (Last 24 hours) at 08/24/11 0852 Last data filed at 08/24/11 0800  Gross per 24 hour  Intake   1292 ml  Output   1400 ml  Net   -108 ml    General: Alert, awake, oriented x3, in no acute distress Neck:  JVP is normal Heart: Regular rate and rhythm, without murmurs, rubs, gallops.  Lungs: Clear to auscultation.  No rales or wheezes. Exemities:  No edema.   Neuro: Grossly intact, nonfocal.  Tele:  SR Lab Results: Results for orders placed during the hospital encounter of 08/23/11 (from the past 24 hour(s))  CARDIAC PANEL(CRET KIN+CKTOT+MB+TROPI)     Status: Abnormal   Collection Time   08/23/11  9:13 AM      Component Value Range   Total CK 5484 (*) 7 - 232 U/L   CK, MB 250.0 (*) 0.3 - 4.0 ng/mL   Troponin I >20.00 (*) <0.30 ng/mL   Relative Index 4.6 (*) 0.0 - 2.5  PRO B NATRIURETIC PEPTIDE     Status: Abnormal   Collection Time   08/23/11  9:13 AM      Component Value Range   Pro B Natriuretic peptide (BNP) 372.1 (*) 0 - 125 pg/mL  PROTIME-INR     Status: Abnormal   Collection Time   08/23/11  9:14 AM      Component Value Range   Prothrombin Time 16.6 (*) 11.6 - 15.2 seconds   INR 1.32  0.00 - 1.49  APTT     Status: Abnormal   Collection Time   08/23/11  9:14 AM      Component Value Range   aPTT 47 (*) 24 - 37 seconds  CBC WITH DIFFERENTIAL     Status: Abnormal   Collection Time   08/23/11  9:14 AM      Component Value Range   WBC 13.1 (*) 4.0 - 10.5 K/uL   RBC 4.96  4.22 - 5.81 MIL/uL   Hemoglobin 15.9  13.0 - 17.0 g/dL   HCT 78.2  95.6 - 21.3 %   MCV 91.1  78.0 - 100.0 fL   MCH 32.1  26.0 - 34.0 pg   MCHC 35.2  30.0 - 36.0  g/dL   RDW 08.6  57.8 - 46.9 %   Platelets 143 (*) 150 - 400 K/uL   Neutrophils Relative 86 (*) 43 - 77 %   Lymphocytes Relative 7 (*) 12 - 46 %   Monocytes Relative 7  3 - 12 %   Eosinophils Relative 0  0 - 5 %   Basophils Relative 0  0 - 1 %   Neutro Abs 11.3 (*) 1.7 - 7.7 K/uL   Lymphs Abs 0.9  0.7 - 4.0 K/uL   Monocytes Absolute 0.9  0.1 - 1.0 K/uL   Eosinophils Absolute 0.0  0.0 - 0.7 K/uL   Basophils Absolute 0.0  0.0 - 0.1 K/uL   Smear Review MORPHOLOGY UNREMARKABLE    TSH     Status: Normal   Collection Time  08/23/11  9:14 AM      Component Value Range   TSH 1.103  0.350 - 4.500 uIU/mL  COMPREHENSIVE METABOLIC PANEL     Status: Abnormal   Collection Time   08/23/11  9:14 AM      Component Value Range   Sodium 138  135 - 145 mEq/L   Potassium 3.9  3.5 - 5.1 mEq/L   Chloride 104  96 - 112 mEq/L   CO2 23  19 - 32 mEq/L   Glucose, Bld 171 (*) 70 - 99 mg/dL   BUN 17  6 - 23 mg/dL   Creatinine, Ser 5.28  0.50 - 1.35 mg/dL   Calcium 8.2 (*) 8.4 - 10.5 mg/dL   Total Protein 6.4  6.0 - 8.3 g/dL   Albumin 3.7  3.5 - 5.2 g/dL   AST 413 (*) 0 - 37 U/L   ALT 88 (*) 0 - 53 U/L   Alkaline Phosphatase 77  39 - 117 U/L   Total Bilirubin 0.7  0.3 - 1.2 mg/dL   GFR calc non Af Amer 89 (*) >90 mL/min   GFR calc Af Amer >90  >90 mL/min  HEMOGLOBIN A1C     Status: Normal   Collection Time   08/23/11  9:14 AM      Component Value Range   Hemoglobin A1C 5.4  <5.7 %   Mean Plasma Glucose 108  <117 mg/dL  CARDIAC PANEL(CRET KIN+CKTOT+MB+TROPI)     Status: Abnormal   Collection Time   08/23/11  3:18 PM      Component Value Range   Total CK 4829 (*) 7 - 232 U/L   CK, MB 244.2 (*) 0.3 - 4.0 ng/mL   Troponin I >20.00 (*) <0.30 ng/mL   Relative Index 5.1 (*) 0.0 - 2.5  CARDIAC PANEL(CRET KIN+CKTOT+MB+TROPI)     Status: Abnormal   Collection Time   08/23/11  8:20 PM      Component Value Range   Total CK 3575 (*) 7 - 232 U/L   CK, MB 168.1 (*) 0.3 - 4.0 ng/mL   Troponin I >20.00 (*) <0.30  ng/mL   Relative Index 4.7 (*) 0.0 - 2.5  BASIC METABOLIC PANEL     Status: Abnormal   Collection Time   08/24/11  5:29 AM      Component Value Range   Sodium 137  135 - 145 mEq/L   Potassium 3.9  3.5 - 5.1 mEq/L   Chloride 102  96 - 112 mEq/L   CO2 25  19 - 32 mEq/L   Glucose, Bld 126 (*) 70 - 99 mg/dL   BUN 17  6 - 23 mg/dL   Creatinine, Ser 2.44  0.50 - 1.35 mg/dL   Calcium 8.5  8.4 - 01.0 mg/dL   GFR calc non Af Amer 86 (*) >90 mL/min   GFR calc Af Amer >90  >90 mL/min  CBC     Status: Abnormal   Collection Time   08/24/11  5:29 AM      Component Value Range   WBC 11.2 (*) 4.0 - 10.5 K/uL   RBC 4.63  4.22 - 5.81 MIL/uL   Hemoglobin 15.1  13.0 - 17.0 g/dL   HCT 27.2  53.6 - 64.4 %   MCV 92.7  78.0 - 100.0 fL   MCH 32.6  26.0 - 34.0 pg   MCHC 35.2  30.0 - 36.0 g/dL   RDW 03.4  74.2 - 59.5 %   Platelets  136 (*) 150 - 400 K/uL  LIPID PANEL     Status: Abnormal   Collection Time   08/24/11  5:29 AM      Component Value Range   Cholesterol 169  0 - 200 mg/dL   Triglycerides 098  <119 mg/dL   HDL 44  >14 mg/dL   Total CHOL/HDL Ratio 3.8     VLDL 25  0 - 40 mg/dL   LDL Cholesterol 782 (*) 0 - 99 mg/dL    Studies/Results: Dg Chest Port 1 View  08/24/2011  *RADIOLOGY REPORT*  Clinical Data: Evaluate for CHF, shortness of breath  PORTABLE CHEST - 1 VIEW  Comparison: 08/23/2011  Findings: Grossly unchanged enlarged cardiac silhouette and mediastinal contours with atherosclerotic calcification in the aortic arch.  Pulmonary vasculature remains indistinct with cephalization of flow.  Grossly unchanged bibasilar heterogeneous opacities.  There is persistent mild elevation of the right hemidiaphragm.  No definite pleural effusion or pneumothorax. Unchanged bones.  IMPRESSION: Grossly unchanged findings suggestive of mild pulmonary edema, though note, atypical infection may have a similar appearance. Further evaluation with a PA and lateral chest radiograph may be obtained as clinically  indicated.  Original Report Authenticated By: Waynard Reeds, M.D.   Chest Portable 1 View  08/23/2011  *RADIOLOGY REPORT*  Clinical Data: Upper chest pain radiating into the head.  PORTABLE CHEST - 1 VIEW  Comparison: None.  Findings: Mild cardiac enlargement.  Pulmonary vascularity is normal but there is interstitial change throughout the lungs which could represent fibrosis or edema.  No focal consolidation.  No blunting of costophrenic angles.  No pneumothorax.  Calcification of the aorta.  IMPRESSION: Diffuse interstitial process suggesting either chronic fibrosis or acute edema.  Mild cardiac enlargement.  Original Report Authenticated By: Marlon Pel, M.D.    Medications:Reviewed   Patient Active Hospital Problem List:  1.  STEMI  Patient s/p PTCA/DES to LAD  With minimal disease elsewhere.  Echo being done today for full baseline eval  LVEF was moderately down at cath.   Continue meds  I would not push dosing with current bp Continue ASA and Effient Consult cardiac rehab to eval Patient to sit at side of bed today.  Up tomorrow.  2.  HL  Crestor  Did not tolerate lipitor in past.   LOS: 1 day   Dietrich Pates 08/24/2011, 8:52 AM

## 2011-08-25 NOTE — Progress Notes (Signed)
Patient ID: MALICHI PALARDY Bright, male   DOB: 1945-11-09, 66 y.o.   MRN: [id number]   Patient Name: Austin Bright Date of Encounter: 08/25/2011    SUBJECTIVE Feeling well this morning. No chest pain or shortness of breath. Walked with cardiac rehabilitation yesterday. Says he can tolerate Crestor. CURRENT MEDS    . allopurinol  300 mg Oral Daily  . aspirin  81 mg Oral Daily  . carvedilol  3.125 mg Oral BID WC  . Chlorhexidine Gluconate Cloth  6 each Topical Q0600  . lisinopril  10 mg Oral Daily  . mupirocin ointment  1 application Nasal BID  . prasugrel  10 mg Oral Daily  . rosuvastatin  40 mg Oral Daily  . DISCONTD: atorvastatin  80 mg Oral q1800  . DISCONTD: NON FORMULARY 40 mg  40 mg Oral q morning - 10a    OBJECTIVE  Filed Vitals:   08/25/11 0300 08/25/11 0400 08/25/11 0500 08/25/11 0600  BP: 103/68 103/71 104/76 105/59  Pulse: 81 79 87 81  Temp:  98.4 F (36.9 C)    TempSrc:  Oral    Resp: 20 20 16 15   Height:      Weight:      SpO2: 91% 91% 91% 92%    Intake/Output Summary (Last 24 hours) at 08/25/11 0714 Last data filed at 08/25/11 0600  Gross per 24 hour  Intake    480 ml  Output   1350 ml  Net   -870 ml   Filed Weights   08/23/11 0452  Weight: 220 lb (99.791 kg)    PHYSICAL EXAM  General: Pleasant, NAD. Neuro: Alert and oriented X 3. Moves all extremities spontaneously. Psych: Normal affect. HEENT:  Normal  Neck: Supple without bruits or JVD. Lungs:  Resp regular and unlabored, CTA. Heart: RRR no s3, s4, or murmurs. Abdomen: Soft, non-tender, non-distended, BS + x 4.  Extremities: No clubbing, cyanosis or edema. DP/PT/Radials 2+ and equal bilaterally.  Accessory Clinical Findings  CBC  Basename 08/24/11 0908 08/24/11 0529 08/23/11 0914  WBC 11.5* 11.2* --  NEUTROABS -- -- 11.3*  HGB 15.3 15.1 --  HCT 43.4 42.9 --  MCV 93.1 92.7 --  PLT 127* 136* --   Basic Metabolic Panel  Basename 08/24/11 0908 08/24/11 0529  NA 139 137  K 4.7  3.9  CL 104 102  CO2 27 25  GLUCOSE 129* 126*  BUN 17 17  CREATININE 1.00 0.96  CALCIUM 8.7 8.5  MG -- --  PHOS -- --   Liver Function Tests  [facility] 08/23/11 0914  AST 354*  ALT 88*  ALKPHOS 77  BILITOT 0.7  PROT 6.4  ALBUMIN 3.7   No results found for this basename: LIPASE:2,AMYLASE:2 in the last 72 hours Cardiac Enzymes  Basename 08/23/11 2020 08/23/11 1518 08/23/11 0913  CKTOTAL 3575* 4829* 5484*  CKMB 168.1* 244.2* 250.0*  CKMBINDEX -- -- --  TROPONINI >20.00* >20.00* >20.00*   BNP No components found with this basename: POCBNP:3 D-Dimer No results found for this basename: DDIMER:2 in the last 72 hours Hemoglobin A1C  Basename 08/23/11 0914  HGBA1C 5.4   Fasting Lipid Panel  Basename 08/24/11 0529  CHOL 169  HDL 44  LDLCALC 100*  TRIG 126  CHOLHDL 3.8  LDLDIRECT --   Thyroid Function Tests  Basename 08/23/11 0914  TSH 1.103  T4TOTAL --  T3FREE --  THYROIDAB --    TELE  Normal sinus rhythm  ECG    Radiology/Studies  Dg Chest Port 1 View  08/24/2011  *RADIOLOGY REPORT*  Clinical Data: Evaluate for CHF, shortness of breath  PORTABLE CHEST - 1 VIEW  Comparison: 08/23/2011  Findings: Grossly unchanged enlarged cardiac silhouette and mediastinal contours with atherosclerotic calcification in the aortic arch.  Pulmonary vasculature remains indistinct with cephalization of flow.  Grossly unchanged bibasilar heterogeneous opacities.  There is persistent mild elevation of the right hemidiaphragm.  No definite pleural effusion or pneumothorax. Unchanged bones.  IMPRESSION: Grossly unchanged findings suggestive of mild pulmonary edema, though note, atypical infection may have a similar appearance. Further evaluation with a PA and lateral chest radiograph may be obtained as clinically indicated.  Original Report Authenticated By: Waynard Reeds, M.D.   Chest Portable 1 View  08/23/2011  *RADIOLOGY REPORT*  Clinical Data: Upper chest pain radiating into  the head.  PORTABLE CHEST - 1 VIEW  Comparison: None.  Findings: Mild cardiac enlargement.  Pulmonary vascularity is normal but there is interstitial change throughout the lungs which could represent fibrosis or edema.  No focal consolidation.  No blunting of costophrenic angles.  No pneumothorax.  Calcification of the aorta.  IMPRESSION: Diffuse interstitial process suggesting either chronic fibrosis or acute edema.  Mild cardiac enlargement.  Original Report Authenticated By: Marlon Pel, M.D.    ASSESSMENT AND PLAN  Active Problems:  STEMI (ST elevation myocardial infarction)  CAD (coronary artery disease)  Dyslipidemia    Mr. Beske is doing well status post anterior apical MI. Blood pressures soft so we'll keep meds where they are today. We'll transfer to the floor for increased rehabilitation prior to discharge. Echocardiogram shows an EF of 45% with anterior apical Chequita Mofield motion hypokinesia. All information shared with patient and all questions answered.  Signed, Valera Castle MD

## 2011-08-26 ENCOUNTER — Inpatient Hospital Stay (HOSPITAL_COMMUNITY): Payer: BC Managed Care – PPO

## 2011-08-26 LAB — CBC WITH DIFFERENTIAL/PLATELET
Basophils Absolute: 0 10*3/uL (ref 0.0–0.1)
Basophils Relative: 0 % (ref 0–1)
Eosinophils Absolute: 0 10*3/uL (ref 0.0–0.7)
HCT: 44.1 % (ref 39.0–52.0)
Hemoglobin: 15.2 g/dL (ref 13.0–17.0)
MCH: 32.2 pg (ref 26.0–34.0)
MCHC: 34.5 g/dL (ref 30.0–36.0)
Monocytes Absolute: 0.9 10*3/uL (ref 0.1–1.0)
Monocytes Relative: 8 % (ref 3–12)
Neutro Abs: 10.3 10*3/uL — ABNORMAL HIGH (ref 1.7–7.7)
Neutrophils Relative %: 85 % — ABNORMAL HIGH (ref 43–77)
RDW: 13.1 % (ref 11.5–15.5)

## 2011-08-26 MED ORDER — POTASSIUM CHLORIDE CRYS ER 20 MEQ PO TBCR
20.0000 meq | EXTENDED_RELEASE_TABLET | Freq: Once | ORAL | Status: AC
  Administered 2011-08-26: 20 meq via ORAL
  Filled 2011-08-26: qty 1

## 2011-08-26 MED ORDER — LEVOFLOXACIN 500 MG PO TABS
500.0000 mg | ORAL_TABLET | Freq: Every day | ORAL | Status: DC
  Administered 2011-08-26: 500 mg via ORAL
  Filled 2011-08-26 (×2): qty 1

## 2011-08-26 MED ORDER — FUROSEMIDE NICU IV SYRINGE 10 MG/ML
40.0000 mg | Freq: Once | INTRAMUSCULAR | Status: DC
  Filled 2011-08-26: qty 4

## 2011-08-26 MED ORDER — FUROSEMIDE 10 MG/ML IJ SOLN
40.0000 mg | Freq: Once | INTRAMUSCULAR | Status: AC
  Administered 2011-08-26: 40 mg via INTRAVENOUS
  Filled 2011-08-26: qty 4

## 2011-08-26 NOTE — Progress Notes (Signed)
Patient ID: Austin Bright, male   DOB: 08-16-1945, 65 y.o.   MRN: [id number]  Pt spiked fever, blood cx ordered.  Per RN, pt with no resp sx, so will hold off on CXR for now.

## 2011-08-26 NOTE — Progress Notes (Signed)
CARDIAC REHAB PHASE I   PRE:  Rate/Rhythm: 85 SR    BP: sitting 109/67    SaO2:   MODE:  Ambulation: 700 ft   POST:  Rate/Rhythm: 106 ST with occ PVC    BP: sitting 123/72     SaO2:   Tolerated well without c/o. All questions answered. Pt anxious to d/c and to walk more in hospital. [phone]  Austin Bright CES, ACSM

## 2011-08-26 NOTE — Progress Notes (Signed)
Patient ID: Austin KEIR Bright, male   DOB: Nov 07, 1945, 66 y.o.   MRN: [id number]   Patient Name: Austin Bright Date of Encounter: 08/26/2011    SUBJECTIVE  Patient spiked a temperature to 102.1 last night. Woke short of breath. Feels like he is developing bronchitis. No cough. Chest x-ray on admission showed a question of some early airspace disease. White blood cell count was mildly elevated. No CBC today. He was positive input about a liter yesterday.  CURRENT MEDS    . allopurinol  300 mg Oral Daily  . aspirin  81 mg Oral Daily  . carvedilol  3.125 mg Oral BID WC  . Chlorhexidine Gluconate Cloth  6 each Topical Q0600  . furosemide  40 mg Intravenous Once  . levofloxacin  500 mg Oral Daily  . lisinopril  10 mg Oral Daily  . mupirocin ointment  1 application Nasal BID  . potassium chloride  20 mEq Oral Once  . prasugrel  10 mg Oral Daily  . rosuvastatin  40 mg Oral Daily    OBJECTIVE  Filed Vitals:   08/26/11 0000 08/26/11 0400 08/26/11 0600 08/26/11 0800  BP: 120/78 122/80  125/78  Pulse: 96 95    Temp: 100.2 F (37.9 C) 102.1 F (38.9 C) 98.4 F (36.9 C) 99.1 F (37.3 C)  TempSrc: Oral Oral Oral Oral  Resp: 14 14    Height:      Weight:      SpO2: 95% 94%      Intake/Output Summary (Last 24 hours) at 08/26/11 0835 Last data filed at 08/26/11 0000  Gross per 24 hour  Intake    800 ml  Output      0 ml  Net    800 ml   Filed Weights   08/23/11 0452  Weight: 220 lb (99.791 kg)    PHYSICAL EXAM  General: Pleasant, NAD. Neuro: Alert and oriented X 3. Moves all extremities spontaneously. Psych: Normal affect. HEENT:  Normal  Neck: Supple without bruits or JVD. Lungs:  Resp regular and unlabored, CTA. Heart: RRR no s3, s4, or murmurs. Abdomen: Soft, non-tender, non-distended, BS + x 4.  Extremities: No clubbing, cyanosis or edema. DP/PT/Radials 2+ and equal bilaterally. Right groin is stable.  Accessory Clinical Findings  CBC  Basename 08/24/11  0908 08/24/11 0529 08/23/11 0914  WBC 11.5* 11.2* --  NEUTROABS -- -- 11.3*  HGB 15.3 15.1 --  HCT 43.4 42.9 --  MCV 93.1 92.7 --  PLT 127* 136* --   Basic Metabolic Panel  Basename 08/24/11 0908 08/24/11 0529  NA 139 137  K 4.7 3.9  CL 104 102  CO2 27 25  GLUCOSE 129* 126*  BUN 17 17  CREATININE 1.00 0.96  CALCIUM 8.7 8.5  MG -- --  PHOS -- --   Liver Function Tests  [facility] 08/23/11 0914  AST 354*  ALT 88*  ALKPHOS 77  BILITOT 0.7  PROT 6.4  ALBUMIN 3.7   No results found for this basename: LIPASE:2,AMYLASE:2 in the last 72 hours Cardiac Enzymes  Basename 08/23/11 2020 08/23/11 1518 08/23/11 0913  CKTOTAL 3575* 4829* 5484*  CKMB 168.1* 244.2* 250.0*  CKMBINDEX -- -- --  TROPONINI >20.00* >20.00* >20.00*   BNP No components found with this basename: POCBNP:3 D-Dimer No results found for this basename: DDIMER:2 in the last 72 hours Hemoglobin A1C  Basename 08/23/11 0914  HGBA1C 5.4   Fasting Lipid Panel  Basename 08/24/11 0529  CHOL 169  HDL 44  LDLCALC 100*  TRIG 126  CHOLHDL 3.8  LDLDIRECT --   Thyroid Function Tests  Basename 08/23/11 0914  TSH 1.103  T4TOTAL --  T3FREE --  THYROIDAB --    TELE  Normal sinus rhythm  ECG   Radiology/Studies  Dg Chest Port 1 View  08/24/2011  *RADIOLOGY REPORT*  Clinical Data: Evaluate for CHF, shortness of breath  PORTABLE CHEST - 1 VIEW  Comparison: 08/23/2011  Findings: Grossly unchanged enlarged cardiac silhouette and mediastinal contours with atherosclerotic calcification in the aortic arch.  Pulmonary vasculature remains indistinct with cephalization of flow.  Grossly unchanged bibasilar heterogeneous opacities.  There is persistent mild elevation of the right hemidiaphragm.  No definite pleural effusion or pneumothorax. Unchanged bones.  IMPRESSION: Grossly unchanged findings suggestive of mild pulmonary edema, though note, atypical infection may have a similar appearance. Further evaluation with  a PA and lateral chest radiograph may be obtained as clinically indicated.  Original Report Authenticated By: Waynard Reeds, M.D.   Chest Portable 1 View  08/23/2011  *RADIOLOGY REPORT*  Clinical Data: Upper chest pain radiating into the head.  PORTABLE CHEST - 1 VIEW  Comparison: None.  Findings: Mild cardiac enlargement.  Pulmonary vascularity is normal but there is interstitial change throughout the lungs which could represent fibrosis or edema.  No focal consolidation.  No blunting of costophrenic angles.  No pneumothorax.  Calcification of the aorta.  IMPRESSION: Diffuse interstitial process suggesting either chronic fibrosis or acute edema.  Mild cardiac enlargement.  Original Report Authenticated By: Marlon Pel, M.D.    ASSESSMENT AND PLAN   With his fever of that magnitude, admission chest x-ray showed possibility of early airspace disease, and suspicious he may have pneumonia. We will begin Levaquin, repeat chest x-ray, and blood culture has been drawn. I'll also give him 40 mg of IV Lasix since he may have a little bit volume overload. We'll check CBC and electrolytes in the morning. We'll supplement with potassium today. Continue cardiac rehabilitation step down and hopefully final discharge depending on the clinical circumstances and a day or 2. Discussed at length with patient. I can see him in [place] or close followup on the 20th.  Signed, Valera Castle MD

## 2011-08-26 NOTE — Progress Notes (Signed)
  CARDIAC REHAB PHASE I   PRE:  Rate/Rhythm: 90SR  BP:  Supine:   Sitting: 118/74  Standing:    SaO2: 94%RA  MODE:  Ambulation: 700 ft   POST:  Rate/Rhythem: 108ST  BP:  Supine:   Sitting: 130/78  Standing:    SaO2: 94-95%RA 1430-1455  Pt walked 700 ft on RA with steady gait. Tolerated well. Denied SOB. Stated he was SOB this am but after lasix he feels less SOB. To bed after walk.  Duanne Limerick

## 2011-08-27 ENCOUNTER — Encounter (HOSPITAL_COMMUNITY): Payer: Self-pay | Admitting: Physician Assistant

## 2011-08-27 DIAGNOSIS — J189 Pneumonia, unspecified organism: Secondary | ICD-10-CM | POA: Diagnosis not present

## 2011-08-27 LAB — BASIC METABOLIC PANEL
BUN: 20 mg/dL (ref 6–23)
Creatinine, Ser: 1.22 mg/dL (ref 0.50–1.35)
GFR calc Af Amer: 71 mL/min — ABNORMAL LOW (ref >90)
GFR calc non Af Amer: 61 mL/min — ABNORMAL LOW (ref >90)

## 2011-08-27 LAB — HEPATIC FUNCTION PANEL
Bilirubin, Direct: 0.1 mg/dL (ref 0.0–0.3)
Indirect Bilirubin: 0.7 mg/dL (ref 0.3–0.9)
Total Protein: 6.4 g/dL (ref 6.0–8.3)

## 2011-08-27 MED ORDER — LEVOFLOXACIN 750 MG PO TABS: 750.0000 mg | ORAL_TABLET | Freq: Every day | ORAL | Status: AC

## 2011-08-27 MED ORDER — PRASUGREL HCL 10 MG PO TABS: 10.0000 mg | ORAL_TABLET | Freq: Every day | ORAL | Status: DC

## 2011-08-27 MED ORDER — LISINOPRIL 10 MG PO TABS: 10.0000 mg | ORAL_TABLET | Freq: Every day | ORAL | Status: DC

## 2011-08-27 MED ORDER — LEVOFLOXACIN 750 MG PO TABS
750.0000 mg | ORAL_TABLET | Freq: Every day | ORAL | Status: DC
  Administered 2011-08-27: 750 mg via ORAL
  Filled 2011-08-27: qty 1

## 2011-08-27 MED ORDER — ACETAMINOPHEN 325 MG PO TABS: 325.0000 mg | ORAL_TABLET | ORAL | Status: DC | PRN

## 2011-08-27 MED ORDER — CARVEDILOL 3.125 MG PO TABS: 3.1250 mg | ORAL_TABLET | Freq: Two times a day (BID) | ORAL | Status: DC

## 2011-08-27 MED ORDER — ROSUVASTATIN CALCIUM 40 MG PO TABS: 40.0000 mg | ORAL_TABLET | Freq: Every day | ORAL | Status: DC

## 2011-08-27 MED ORDER — NITROGLYCERIN 0.4 MG SL SUBL: 0.4000 mg | SUBLINGUAL_TABLET | SUBLINGUAL | Status: DC | PRN

## 2011-08-27 NOTE — Discharge Summary (Signed)
Discharge Summary   Patient ID: Austin Bright MRN: [id number], DOB/AGE: 02-21-1945 66 y.o. Admit date: 08/23/2011 D/C date:     08/27/2011  Primary Cardiologist: Daleen Squibb [name])  Primary Discharge Diagnoses:  1. Newly diagnosed CAD  - anterior STEMI due to thrombotic occlusion of mid LAD s/p thrombectomy, PTCA, DES placement 08/23/11 2. Ischemic cardiomyopathy  - initial EF 35% by cath 08/23/11, then 40-45% by echo 08/24/11 3. Bilateral lower lobe pneumonia - to complete 10-day course of Levaquin 4. Dyslipidemia - will need OP lipids/LFTs since started on statin 5. HTN 6. Hyperglycemia - A1C 5.4  Secondary Discharge Diagnoses:  1. Gout  Hospital Course: 66 y/o M with hx of HTN, gout presented to the ER 08/23/11 complaining of chest pain intermittently for a month with exertion, relieved with rest. He went to bed the night prior to admission at 11pm but every few hours the pain would come on. It was eventually unremitting by 4 am so he came to the ER where he was noted to have anterior ST elevation. He was taken to the cath lab emergently where he was noted to have thrombotic occlusion of the mid LAD with a large thrombus burden, no significant CAD otherwise. Dr. Kirke Corin performed successful thrombectomy, angioplasty and drug-eluting stent placement to the mid LAD. There was slow flow in the LAD likely due to distal embolization which was treated successfully with intracoronary adenosine. There was still residual ST elevation at the end of the case. However, the patient was chest pain-free at that point. Recommendation was for DAPT and aggressive risk factor management. LV EF was 35% by cath with severe mid to distal anterior Austin Bright, apical and distal inferior Austin Bright hypokinesis. Post procedurally, the patient's activity was progressed. He was continued on ASA, Effient, and Crestor (note he did not tolerate Lipitor in the past). 2D echo was performed 08/24/11 showing some improvement in LV function with EF  40-45%. BP remained somewhat soft, limiting med titration. Overnight 8/6-8/7, his temperature was noted to be 102.1, in absence of other symptoms with otherwise normal vital signs. CXR demonstrated interval development of patchy bilateral lower lobe pneumonias. He was subsequently started on Levaquin. Blood cx were drawn. He was also given a dose of IV Lasix for some volume overload, which improved his SOB. Today he is feeling better. Dr. Daleen Squibb has seen and examined the patient and feels he is stable for discharge. He recommends a 10-day total course of Levaquin. He does not feel the patient requires daily Lasix for now.   Discharge Vitals: Blood pressure 121/78, pulse 80, temperature 99.2 F (37.3 C), temperature source Oral, resp. rate 18, height 5\' 10"  (1.778 m), weight 219 lb 9.6 oz (99.61 kg), SpO2 96.00%.  Labs: Lab Results  Component Value Date   WBC 12.1* 08/26/2011   HGB 15.2 08/26/2011   HCT 44.1 08/26/2011   MCV 93.4 08/26/2011   PLT 133* 08/26/2011     Lab 08/27/11 0550  NA 141  K 4.1  CL 104  CO2 27  BUN 20  CREATININE 1.22  CALCIUM 8.9  PROT 6.4  BILITOT 0.8  ALKPHOS 60  ALT 35  AST 47*  GLUCOSE 116*    Lab Results  Component Value Date   CHOL 169 08/24/2011   HDL 44 08/24/2011   LDLCALC 409* 08/24/2011   TRIG 126 08/24/2011     Diagnostic Studies/Procedures   1. Chest Port 1 View 08/26/2011  *RADIOLOGY REPORT*  Clinical Data: Fever.  PORTABLE CHEST -  1 VIEW  Comparison: 08/05 and 08/23/2011  Findings: The patient has patchy bilateral lower lobe pneumonias which have developed since the prior exam.  Heart size and vascularity are normal.  No definitive effusions.  No acute osseous abnormality.  IMPRESSION: Interval development of patchy bilateral lower lobe pneumonias.  Original Report Authenticated By: Gwynn Burly, M.D.   2. Chest Port 1 View 08/24/2011  *RADIOLOGY REPORT*  Clinical Data: Evaluate for CHF, shortness of breath  PORTABLE CHEST - 1 VIEW  Comparison: 08/23/2011   Findings: Grossly unchanged enlarged cardiac silhouette and mediastinal contours with atherosclerotic calcification in the aortic arch.  Pulmonary vasculature remains indistinct with cephalization of flow.  Grossly unchanged bibasilar heterogeneous opacities.  There is persistent mild elevation of the right hemidiaphragm.  No definite pleural effusion or pneumothorax. Unchanged bones.  IMPRESSION: Grossly unchanged findings suggestive of mild pulmonary edema, though note, atypical infection may have a similar appearance. Further evaluation with a PA and lateral chest radiograph may be obtained as clinically indicated.  Original Report Authenticated By: Waynard Reeds, M.D.   3. Chest Portable 1 View 08/23/2011  *RADIOLOGY REPORT*  Clinical Data: Upper chest pain radiating into the head.  PORTABLE CHEST - 1 VIEW  Comparison: None.  Findings: Mild cardiac enlargement.  Pulmonary vascularity is normal but there is interstitial change throughout the lungs which could represent fibrosis or edema.  No focal consolidation.  No blunting of costophrenic angles.  No pneumothorax.  Calcification of the aorta.  IMPRESSION: Diffuse interstitial process suggesting either chronic fibrosis or acute edema.  Mild cardiac enlargement.  Original Report Authenticated By: Marlon Pel, M.D.   4. 2D Echo 08/24/11 Study Conclusions - Left ventricle: The cavity size was normal. Systolic function was mildly to moderately reduced. The estimated ejection fraction was in the range of 40% to 45%. There is severe hypokinesis of the mid-distalanteroseptal and apical myocardium. Features are consistent with a pseudonormal left ventricular filling pattern, with concomitant abnormal relaxation and increased filling pressure (grade 2 diastolic dysfunction). Doppler parameters are consistent with high ventricular filling pressure. - Left atrium: The atrium was mildly dilated. - Right ventricle: Systolic pressure was moderately  to severely increased. - Pulmonary arteries: PA peak pressure: 47mm Hg (S  Discharge Medications   Medication List  As of 08/27/2011  1:16 PM   STOP taking these medications         aspirin EC 325 MG tablet      BILBERRY EXTRACT PO         TAKE these medications         acetaminophen 325 MG tablet   Commonly known as: TYLENOL   Take 1-2 tablets (325-650 mg total) by mouth every 4 (four) hours as needed for pain or fever.      allopurinol 300 MG tablet   Commonly known as: ZYLOPRIM   Take 300 mg by mouth daily.      aspirin EC 81 MG tablet   Take 81 mg by mouth daily.      carvedilol 3.125 MG tablet   Commonly known as: COREG   Take 1 tablet (3.125 mg total) by mouth 2 (two) times daily with a meal.      CINNAMON PO   Take 1 capsule by mouth daily.      levofloxacin 750 MG tablet   Commonly known as: LEVAQUIN   Take 1 tablet (750 mg total) by mouth daily.   Start taking on: 08/28/2011  lisinopril 10 MG tablet   Commonly known as: PRINIVIL,ZESTRIL   Take 1 tablet (10 mg total) by mouth daily.      nitroGLYCERIN 0.4 MG SL tablet   Commonly known as: NITROSTAT   Place 1 tablet (0.4 mg total) under the tongue every 5 (five) minutes x 3 doses as needed for chest pain.      prasugrel 10 MG Tabs   Commonly known as: EFFIENT   Take 1 tablet (10 mg total) by mouth daily.      PRESERVISION AREDS Tabs   Take 1 tablet by mouth daily.      rosuvastatin 40 MG tablet   Commonly known as: CRESTOR   Take 1 tablet (40 mg total) by mouth daily.      vitamin C 500 MG tablet   Commonly known as: ASCORBIC ACID   Take 500 mg by mouth daily.            Disposition   The patient will be discharged in stable condition to home. Discharge Orders    Future Appointments: Provider: Department: Dept Phone: Center:   09/08/2011 11:45 AM Gaylord Shih, MD [facility] [phone] [id number]     Future Orders Please Complete By Expires   Amb Referral to Cardiac  Rehabilitation      Diet - low sodium heart healthy      Discharge instructions      Comments:   Your heart cath and echocardiogram showed weakness of the heart muscle this admission. This may make you more susceptible to weight gain from fluid retention, which can lead to symptoms that we call heart failure.   For patients with this condition, we give them these special instructions:   1. Follow a low-salt diet and watch your fluid intake. In general, you should not be taking in more than 2 liters of fluid per day. Some patients are restricted to less than 1.5 liters. This includes sources of water in foods like soup. 2. Weigh yourself on the same scale at same time of day and keep a log. 3. Call your doctor: (Anytime you feel any of the following symptoms)  - 3-4 pound weight gain in 1-2 days or 2 pounds overnight  - Shortness of breath, with or without a dry hacking cough  - Swelling in the hands, feet or stomach  - If you have to sleep on extra pillows at night in order to breathe   Increase activity slowly      Comments:   No driving for 2 weeks. No lifting over 10 lbs for 4 weeks. No sexual activity for 4 weeks. You may not return to work until cleared by your cardiologist. Keep procedure site clean & dry. If you notice increased pain, swelling, bleeding or pus, call/return!  You may shower, but no soaking baths/hot tubs/pools for 1 week.     Follow-up Information    Follow up with [facility]. (Follow up with primary doctor in about 10 days for repeat chest x-ray to make sure pneumonia has resolved. Your blood sugar was also mildly elevated this admission but not quite yet to diabetes level - this needs to be followed.)       Follow up with Valera Castle, MD. [name] - 09/08/11 at 11:45am)    Contact information:   [address] [place] [address] [phone]             Duration of Discharge Encounter: Greater than 30 minutes including physician  and  PA time.  Signed, Dayna Dunn PA-C 08/27/2011, 1:16 PM     Jesse Sans. Daleen Squibb, MD, [facility] Pager:  [phone]

## 2011-08-27 NOTE — Progress Notes (Signed)
Patient ID: Austin Bright Bright, male   DOB: May 11, 1945, 66 y.o.   MRN: [id number]   Patient Name: Austin Bright Date of Encounter: 08/27/2011    SUBJECTIVE  Austin Bright is feeling better this morning. His chest x-ray shows bilateral  lower lobe pneumonia. His temperature spiked to a peak of 100.2 yesterday. He is currently afebrile. White count is 12,000.  And ambulating without significant shortness of breath. He denies orthopnea or PND. He has no productive cough.  CURRENT MEDS    . allopurinol  300 mg Oral Daily  . aspirin  81 mg Oral Daily  . carvedilol  3.125 mg Oral BID WC  . Chlorhexidine Gluconate Cloth  6 each Topical Q0600  . furosemide  40 mg Intravenous Once  . levofloxacin  500 mg Oral Daily  . lisinopril  10 mg Oral Daily  . mupirocin ointment  1 application Nasal BID  . potassium chloride  20 mEq Oral Once  . prasugrel  10 mg Oral Daily  . rosuvastatin  40 mg Oral Daily  . DISCONTD: furosemide  40 mg Intravenous Once    OBJECTIVE  Filed Vitals:   08/27/11 0300 08/27/11 0400 08/27/11 0500 08/27/11 0600  BP: 112/70 111/78 123/79 114/77  Pulse:      Temp:  98.9 F (37.2 C)    TempSrc:  Oral    Resp:      Height:      Weight:      SpO2: 95%       Intake/Output Summary (Last 24 hours) at 08/27/11 0724 Last data filed at 08/27/11 0300  Gross per 24 hour  Intake   1160 ml  Output   3200 ml  Net  -2040 ml   Filed Weights   08/23/11 0452 08/26/11 1302  Weight: 220 lb (99.791 kg) 219 lb 9.6 oz (99.61 kg)    PHYSICAL EXAM  General: Pleasant, NAD. Neuro: Alert and oriented X 3. Moves all extremities spontaneously. Psych: Normal affect. HEENT:  Normal  Neck: Supple without bruits or JVD. Lungs:  Resp regular and unlabored, bilateral lower airspace sounds Heart: RRR no s3, s4, or murmurs. Abdomen: Soft, non-tender, non-distended, BS + x 4.  Extremities: No clubbing, cyanosis or edema. DP/PT/Radials 2+ and equal bilaterally.  Accessory Clinical  Findings  CBC  Basename 08/26/11 0836 08/24/11 0908  WBC 12.1* 11.5*  NEUTROABS 10.3* --  HGB 15.2 15.3  HCT 44.1 43.4  MCV 93.4 93.1  PLT 133* 127*   Basic Metabolic Panel  Basename 08/24/11 0908  NA 139  K 4.7  CL 104  CO2 27  GLUCOSE 129*  BUN 17  CREATININE 1.00  CALCIUM 8.7  MG --  PHOS --   Liver Function Tests No results found for this basename: AST:2,ALT:2,ALKPHOS:2,BILITOT:2,PROT:2,ALBUMIN:2 in the last 72 hours No results found for this basename: LIPASE:2,AMYLASE:2 in the last 72 hours Cardiac Enzymes No results found for this basename: CKTOTAL:3,CKMB:3,CKMBINDEX:3,TROPONINI:3 in the last 72 hours BNP No components found with this basename: POCBNP:3 D-Dimer No results found for this basename: DDIMER:2 in the last 72 hours Hemoglobin A1C No results found for this basename: HGBA1C in the last 72 hours Fasting Lipid Panel No results found for this basename: CHOL,HDL,LDLCALC,TRIG,CHOLHDL,LDLDIRECT in the last 72 hours Thyroid Function Tests No results found for this basename: TSH,T4TOTAL,FREET3,T3FREE,THYROIDAB in the last 72 hours  TELE  Normal sinus rhythm  ECG    Radiology/Studies  Dg Chest Port 1 View  08/26/2011  *RADIOLOGY REPORT*  Clinical Data:  Fever.  PORTABLE CHEST - 1 VIEW  Comparison: 08/05 and 08/23/2011  Findings: The patient has patchy bilateral lower lobe pneumonias which have developed since the prior exam.  Heart size and vascularity are normal.  No definitive effusions.  No acute osseous abnormality.  IMPRESSION: Interval development of patchy bilateral lower lobe pneumonias.  Original Report Authenticated By: Gwynn Burly, M.D.   Dg Chest Port 1 View  08/24/2011  *RADIOLOGY REPORT*  Clinical Data: Evaluate for CHF, shortness of breath  PORTABLE CHEST - 1 VIEW  Comparison: 08/23/2011  Findings: Grossly unchanged enlarged cardiac silhouette and mediastinal contours with atherosclerotic calcification in the aortic arch.  Pulmonary  vasculature remains indistinct with cephalization of flow.  Grossly unchanged bibasilar heterogeneous opacities.  There is persistent mild elevation of the right hemidiaphragm.  No definite pleural effusion or pneumothorax. Unchanged bones.  IMPRESSION: Grossly unchanged findings suggestive of mild pulmonary edema, though note, atypical infection may have a similar appearance. Further evaluation with a PA and lateral chest radiograph may be obtained as clinically indicated.  Original Report Authenticated By: Waynard Reeds, M.D.   Chest Portable 1 View  08/23/2011  *RADIOLOGY REPORT*  Clinical Data: Upper chest pain radiating into the head.  PORTABLE CHEST - 1 VIEW  Comparison: None.  Findings: Mild cardiac enlargement.  Pulmonary vascularity is normal but there is interstitial change throughout the lungs which could represent fibrosis or edema.  No focal consolidation.  No blunting of costophrenic angles.  No pneumothorax.  Calcification of the aorta.  IMPRESSION: Diffuse interstitial process suggesting either chronic fibrosis or acute edema.  Mild cardiac enlargement.  Original Report Authenticated By: Marlon Pel, M.D.    ASSESSMENT AND PLAN  Active Problems:  STEMI (ST elevation myocardial infarction)  CAD (coronary artery disease)  Dyslipidemia    He is significantly improved this morning. He really wants to go home. I think it's safe with a 10 day course of Levaquin. I'll see him in close followup on the 20th in [place]. We'll not send him home on daily Lasix. No change in current medications. All questions answered.  Signed, Valera Castle MD

## 2011-09-01 LAB — CULTURE, BLOOD (ROUTINE X 2): Culture: NO GROWTH

## 2011-09-04 ENCOUNTER — Encounter: Payer: Self-pay | Admitting: Cardiology

## 2011-09-07 ENCOUNTER — Other Ambulatory Visit (HOSPITAL_COMMUNITY): Payer: Self-pay | Admitting: Pulmonary Disease

## 2011-09-07 ENCOUNTER — Ambulatory Visit (HOSPITAL_COMMUNITY)
Admission: RE | Admit: 2011-09-07 | Discharge: 2011-09-07 | Disposition: A | Discharge status: Home / Self Care | Payer: BC Managed Care – PPO | Source: Ambulatory Visit | Attending: Pulmonary Disease | Admitting: Pulmonary Disease

## 2011-09-07 DIAGNOSIS — J449 Chronic obstructive pulmonary disease, unspecified: Secondary | ICD-10-CM | POA: Insufficient documentation

## 2011-09-07 DIAGNOSIS — R0602 Shortness of breath: Secondary | ICD-10-CM | POA: Insufficient documentation

## 2011-09-07 DIAGNOSIS — J189 Pneumonia, unspecified organism: Secondary | ICD-10-CM | POA: Insufficient documentation

## 2011-09-07 DIAGNOSIS — J4489 Other specified chronic obstructive pulmonary disease: Secondary | ICD-10-CM | POA: Insufficient documentation

## 2011-09-08 ENCOUNTER — Encounter: Payer: Self-pay | Admitting: Cardiology

## 2011-09-08 ENCOUNTER — Ambulatory Visit (INDEPENDENT_AMBULATORY_CARE_PROVIDER_SITE_OTHER): Payer: BC Managed Care – PPO | Admitting: Cardiology

## 2011-09-08 VITALS — BP 118/80 | HR 62 | Ht 70.0 in | Wt 215.4 lb

## 2011-09-08 DIAGNOSIS — I2510 Atherosclerotic heart disease of native coronary artery without angina pectoris: Secondary | ICD-10-CM

## 2011-09-08 DIAGNOSIS — E785 Hyperlipidemia, unspecified: Secondary | ICD-10-CM

## 2011-09-08 NOTE — Assessment & Plan Note (Signed)
His systemic symptoms of not feeling well and fatigue may be due to Crestor. We'll hold for 2 weeks to see if he improves. If he does he'll let us know and we will start pravastatin 40 mg at bedtime.

## 2011-09-08 NOTE — Assessment & Plan Note (Signed)
He is doing well after his anterior septal MI. He will roll cardiac rehabilitation here in [place]. I've asked him not to work for another 3-4 weeks. 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 I'll see him back in 2 months.

## 2011-09-08 NOTE — Progress Notes (Signed)
HPI This Austin Bright comes in for post hospital followup after having an anterior septal MI. His ejection fraction in it being about 40-45%. Course was complicated by pneumonia. He had PCI.  Since discharge he has had no angina or chest discomfort. His breathing and cough has resolved. He is walking on the treadmill 18 minutes twice a day. He denies orthopnea, PND or edema.  His biggest complaint is just feeling very fatigued and tired. He has not tolerated Lipitor in the past.  Blood work yesterday showed a hemoglobin of 14.1 platelet count 310 white count is back to normal 7.9 with a normal differential, metabolic profile is normal. PSA was 2.38. This was done by Dr. Juanetta Gosling.  Past Medical History  Diagnosis Date  . Gout   . Hypertension   . CAD (coronary artery disease)     a. Diagnosed 08/2011 with anterior STEMI due to thrombotic occlusion of mid LAD s/p thrombectomy, PTCA, DES placement 08/23/11.  . Ischemic cardiomyopathy     a. Initial EF 35% by cath 08/23/11, improved to 40-45% by echo 08/25/11.  . Bilateral pneumonia     Diagnosed after STEMI 08/2011  . Dyslipidemia     Current Outpatient Prescriptions  Medication Sig Dispense Refill  . acetaminophen (TYLENOL) 325 MG tablet Take 1-2 tablets (325-650 mg total) by mouth every 4 (four) hours as needed for pain or fever.      Marland Kitchen allopurinol (ZYLOPRIM) 300 MG tablet Take 300 mg by mouth daily.      Allen Kell Lipoic Acid 200 MG CAPS Take 1 capsule by mouth daily.      Marland Kitchen aspirin EC 81 MG tablet Take 81 mg by mouth daily.      . carvedilol (COREG) 3.125 MG tablet Take 1 tablet (3.125 mg total) by mouth 2 (two) times daily with a meal.  60 tablet  6  . CINNAMON PO Take 1 capsule by mouth daily.      . Coenzyme Q10 (CO Q-10) 100 MG CAPS Take 1 capsule by mouth daily.      Chilton Si Tea 150 MG CAPS Take 1 capsule by mouth daily.      Marland Kitchen lisinopril (PRINIVIL,ZESTRIL) 10 MG tablet Take 1 tablet (10 mg total) by mouth daily.  30 tablet  6  . nitroGLYCERIN  (NITROSTAT) 0.4 MG SL tablet Place 1 tablet (0.4 mg total) under the tongue every 5 (five) minutes x 3 doses as needed for chest pain.  25 tablet  4  . prasugrel (EFFIENT) 10 MG TABS Take 1 tablet (10 mg total) by mouth daily.  30 tablet  6  . RESVERATROL 100 MG CAPS Take 1 capsule by mouth daily.      . Turmeric 500 MG CAPS Take 1 capsule by mouth daily.      . vitamin C (ASCORBIC ACID) 500 MG tablet Take 500 mg by mouth daily.        No Known Allergies  No family history on file.  History   Social History  . Marital Status: Divorced    Spouse Name: N/A    Number of Children: N/A  . Years of Education: N/A   Occupational History  . Not on file.   Social History Main Topics  . Smoking status: Former Smoker    Quit date: 08/23/1979  . Smokeless tobacco: Not on file  . Alcohol Use: 2.4 oz/week    2 Cans of beer, 2 Shots of liquor per week     occasional  .  Drug Use: No  . Sexually Active: Yes   Other Topics Concern  . Not on file   Social History Narrative  . No narrative on file    ROS ALL NEGATIVE EXCEPT THOSE NOTED IN HPI  PE  General Appearance: well developed, well nourished in no acute distress HEENT: symmetrical face, PERRLA, good dentition  Neck: no JVD, thyromegaly, or adenopathy, trachea midline Chest: symmetric without deformity Cardiac: PMI non-displaced, RRR, normal S1, S2, no gallop or murmur Lung: clear to ausculation and percussion Vascular: all pulses full without bruits  Abdominal: nondistended, nontender, good bowel sounds, no HSM, no bruits Extremities: no cyanosis, clubbing or edema, no sign of DVT, no varicosities  Skin: normal color, no rashes Neuro: alert and oriented x 3, non-focal Pysch: normal affect  EKG  BMET    Component Value Date/Time   NA 141 08/27/2011 0550   K 4.1 08/27/2011 0550   CL 104 08/27/2011 0550   CO2 27 08/27/2011 0550   GLUCOSE 116* 08/27/2011 0550   BUN 20 08/27/2011 0550   CREATININE 1.22 08/27/2011 0550   CALCIUM 8.9  08/27/2011 0550   GFRNONAA 61* 08/27/2011 0550   GFRAA 71* 08/27/2011 0550    Lipid Panel     Component Value Date/Time   CHOL 169 08/24/2011 0529   TRIG 126 08/24/2011 0529   HDL 44 08/24/2011 0529   CHOLHDL 3.8 08/24/2011 0529   VLDL 25 08/24/2011 0529   LDLCALC 100* 08/24/2011 0529    CBC    Component Value Date/Time   WBC 12.1* 08/26/2011 0836   RBC 4.72 08/26/2011 0836   HGB 15.2 08/26/2011 0836   HCT 44.1 08/26/2011 0836   PLT 133* 08/26/2011 0836   MCV 93.4 08/26/2011 0836   MCH 32.2 08/26/2011 0836   MCHC 34.5 08/26/2011 0836   RDW 13.1 08/26/2011 0836   LYMPHSABS 0.8 08/26/2011 0836   MONOABS 0.9 08/26/2011 0836   EOSABS 0.0 08/26/2011 0836   BASOSABS 0.0 08/26/2011 5621

## 2011-09-08 NOTE — Patient Instructions (Signed)
Your physician recommends that you schedule a follow-up appointment in: 2 month follow up  Your physician has recommended you make the following change in your medication:  1 - Hold Crestor for now and advise [place] if your symptoms subside in 1 month and Dr Daleen Squibb will revisit another therapy for you

## 2011-09-15 ENCOUNTER — Encounter (HOSPITAL_COMMUNITY): Payer: Self-pay | Admitting: *Deleted

## 2011-09-15 ENCOUNTER — Other Ambulatory Visit: Payer: Self-pay

## 2011-09-15 ENCOUNTER — Encounter (HOSPITAL_COMMUNITY)
Admission: EM | Disposition: A | Discharge status: Home / Self Care | Payer: Self-pay | Source: Home / Self Care | Attending: Cardiology

## 2011-09-15 ENCOUNTER — Inpatient Hospital Stay (HOSPITAL_COMMUNITY)
Admission: EM | Admit: 2011-09-15 | Discharge: 2011-09-16 | DRG: 544 | Disposition: A | Discharge status: Home / Self Care | Payer: BC Managed Care – PPO | Attending: Cardiology | Admitting: Cardiology

## 2011-09-15 ENCOUNTER — Emergency Department (HOSPITAL_COMMUNITY): Payer: BC Managed Care – PPO

## 2011-09-15 DIAGNOSIS — I5042 Chronic combined systolic (congestive) and diastolic (congestive) heart failure: Secondary | ICD-10-CM | POA: Diagnosis present

## 2011-09-15 DIAGNOSIS — I252 Old myocardial infarction: Secondary | ICD-10-CM

## 2011-09-15 DIAGNOSIS — I10 Essential (primary) hypertension: Secondary | ICD-10-CM | POA: Diagnosis present

## 2011-09-15 DIAGNOSIS — Z79899 Other long term (current) drug therapy: Secondary | ICD-10-CM

## 2011-09-15 DIAGNOSIS — I2510 Atherosclerotic heart disease of native coronary artery without angina pectoris: Secondary | ICD-10-CM

## 2011-09-15 DIAGNOSIS — Z7982 Long term (current) use of aspirin: Secondary | ICD-10-CM

## 2011-09-15 DIAGNOSIS — Z87891 Personal history of nicotine dependence: Secondary | ICD-10-CM

## 2011-09-15 DIAGNOSIS — Z8701 Personal history of pneumonia (recurrent): Secondary | ICD-10-CM

## 2011-09-15 DIAGNOSIS — I2589 Other forms of chronic ischemic heart disease: Secondary | ICD-10-CM | POA: Diagnosis present

## 2011-09-15 DIAGNOSIS — M129 Arthropathy, unspecified: Secondary | ICD-10-CM | POA: Diagnosis present

## 2011-09-15 DIAGNOSIS — J9600 Acute respiratory failure, unspecified whether with hypoxia or hypercapnia: Secondary | ICD-10-CM | POA: Diagnosis present

## 2011-09-15 DIAGNOSIS — I509 Heart failure, unspecified: Secondary | ICD-10-CM

## 2011-09-15 DIAGNOSIS — R079 Chest pain, unspecified: Secondary | ICD-10-CM

## 2011-09-15 DIAGNOSIS — Z9861 Coronary angioplasty status: Secondary | ICD-10-CM

## 2011-09-15 DIAGNOSIS — I5041 Acute combined systolic (congestive) and diastolic (congestive) heart failure: Secondary | ICD-10-CM

## 2011-09-15 DIAGNOSIS — I2109 ST elevation (STEMI) myocardial infarction involving other coronary artery of anterior wall: Secondary | ICD-10-CM | POA: Diagnosis present

## 2011-09-15 DIAGNOSIS — I200 Unstable angina: Secondary | ICD-10-CM

## 2011-09-15 DIAGNOSIS — M109 Gout, unspecified: Secondary | ICD-10-CM | POA: Diagnosis present

## 2011-09-15 DIAGNOSIS — I517 Cardiomegaly: Secondary | ICD-10-CM

## 2011-09-15 DIAGNOSIS — I5023 Acute on chronic systolic (congestive) heart failure: Principal | ICD-10-CM | POA: Diagnosis present

## 2011-09-15 DIAGNOSIS — E785 Hyperlipidemia, unspecified: Secondary | ICD-10-CM | POA: Diagnosis present

## 2011-09-15 HISTORY — DX: Heart failure, unspecified: I50.9

## 2011-09-15 HISTORY — DX: Shortness of breath: R06.02

## 2011-09-15 HISTORY — DX: Chronic combined systolic (congestive) and diastolic (congestive) heart failure: I50.42

## 2011-09-15 HISTORY — DX: Unspecified osteoarthritis, unspecified site: M19.90

## 2011-09-15 HISTORY — DX: Acute myocardial infarction, unspecified: I21.9

## 2011-09-15 HISTORY — PX: LEFT AND RIGHT HEART CATHETERIZATION WITH CORONARY ANGIOGRAM: SHX5449

## 2011-09-15 LAB — CARDIAC PANEL(CRET KIN+CKTOT+MB+TROPI)
CK, MB: 1.8 ng/mL (ref 0.3–4.0)
Relative Index: INVALID (ref 0.0–2.5)
Relative Index: INVALID (ref 0.0–2.5)
Total CK: 65 U/L (ref 7–232)
Troponin I: <0.3 ng/mL (ref <0.30)

## 2011-09-15 LAB — CBC
HCT: 40.3 % (ref 39.0–52.0)
Hemoglobin: 14 g/dL (ref 13.0–17.0)
RBC: 4.45 MIL/uL (ref 4.22–5.81)
WBC: 8.2 10*3/uL (ref 4.0–10.5)

## 2011-09-15 LAB — BASIC METABOLIC PANEL
CO2: 23 mEq/L (ref 19–32)
Chloride: 105 mEq/L (ref 96–112)
GFR calc non Af Amer: 70 mL/min — ABNORMAL LOW (ref >90)
Glucose, Bld: 100 mg/dL — ABNORMAL HIGH (ref 70–99)
Potassium: 3.8 mEq/L (ref 3.5–5.1)
Sodium: 138 mEq/L (ref 135–145)

## 2011-09-15 LAB — PRO B NATRIURETIC PEPTIDE
Pro B Natriuretic peptide (BNP): 1846 pg/mL — ABNORMAL HIGH (ref 0–125)
Pro B Natriuretic peptide (BNP): 1973 pg/mL — ABNORMAL HIGH (ref 0–125)

## 2011-09-15 LAB — HEPARIN LEVEL (UNFRACTIONATED): Heparin Unfractionated: 0.1 IU/mL — ABNORMAL LOW (ref 0.30–0.70)

## 2011-09-15 LAB — PROTIME-INR
INR: 1.05 (ref 0.00–1.49)
INR: 1.17 (ref 0.00–1.49)
Prothrombin Time: 13.9 seconds (ref 11.6–15.2)

## 2011-09-15 SURGERY — LEFT AND RIGHT HEART CATHETERIZATION WITH CORONARY ANGIOGRAM
Anesthesia: LOCAL

## 2011-09-15 MED ORDER — ASPIRIN 325 MG PO TABS
ORAL_TABLET | ORAL | Status: AC
  Filled 2011-09-15: qty 1

## 2011-09-15 MED ORDER — SODIUM CHLORIDE 0.9 % IV SOLN: 250.0000 mL | INTRAVENOUS | Status: DC | PRN

## 2011-09-15 MED ORDER — LISINOPRIL 10 MG PO TABS: 10.0000 mg | ORAL_TABLET | Freq: Every day | ORAL | Status: DC

## 2011-09-15 MED ORDER — VITAMIN C 500 MG PO TABS
500.0000 mg | ORAL_TABLET | Freq: Every day | ORAL | Status: DC
  Administered 2011-09-15 – 2011-09-16 (×2): 500 mg via ORAL
  Filled 2011-09-15 (×2): qty 1

## 2011-09-15 MED ORDER — DIAZEPAM 5 MG PO TABS
5.0000 mg | ORAL_TABLET | ORAL | Status: AC
  Administered 2011-09-15: 5 mg via ORAL

## 2011-09-15 MED ORDER — SODIUM CHLORIDE 0.9 % IJ SOLN: 3.0000 mL | INTRAMUSCULAR | Status: DC | PRN

## 2011-09-15 MED ORDER — ASPIRIN 325 MG PO TABS
325.0000 mg | ORAL_TABLET | Freq: Once | ORAL | Status: AC
  Administered 2011-09-15: 325 mg via ORAL

## 2011-09-15 MED ORDER — NITROGLYCERIN IN D5W 200-5 MCG/ML-% IV SOLN
5.0000 ug/min | Freq: Once | INTRAVENOUS | Status: AC
  Administered 2011-09-15: 5 ug/min via INTRAVENOUS
  Filled 2011-09-15: qty 250

## 2011-09-15 MED ORDER — HEPARIN (PORCINE) IN NACL 2-0.9 UNIT/ML-% IJ SOLN
INTRAMUSCULAR | Status: AC
  Filled 2011-09-15: qty 2000

## 2011-09-15 MED ORDER — DIAZEPAM 5 MG PO TABS: 5.0000 mg | ORAL_TABLET | ORAL | Status: DC

## 2011-09-15 MED ORDER — FUROSEMIDE 10 MG/ML IJ SOLN
40.0000 mg | Freq: Once | INTRAMUSCULAR | Status: AC
  Administered 2011-09-15: 40 mg via INTRAVENOUS
  Filled 2011-09-15: qty 4

## 2011-09-15 MED ORDER — HEPARIN BOLUS VIA INFUSION
2500.0000 [IU] | Freq: Once | INTRAVENOUS | Status: AC
  Administered 2011-09-15: 2500 [IU] via INTRAVENOUS
  Filled 2011-09-15: qty 2500

## 2011-09-15 MED ORDER — NITROGLYCERIN 0.2 MG/ML ON CALL CATH LAB
INTRAVENOUS | Status: AC
  Filled 2011-09-15: qty 1

## 2011-09-15 MED ORDER — CARVEDILOL 3.125 MG PO TABS: 3.1250 mg | ORAL_TABLET | Freq: Two times a day (BID) | ORAL | Status: DC

## 2011-09-15 MED ORDER — ONDANSETRON HCL 4 MG/2ML IJ SOLN: 4.0000 mg | Freq: Four times a day (QID) | INTRAMUSCULAR | Status: DC | PRN

## 2011-09-15 MED ORDER — FENTANYL CITRATE 0.05 MG/ML IJ SOLN
INTRAMUSCULAR | Status: AC
  Filled 2011-09-15: qty 2

## 2011-09-15 MED ORDER — LISINOPRIL 10 MG PO TABS
10.0000 mg | ORAL_TABLET | Freq: Every day | ORAL | Status: DC
  Administered 2011-09-15 – 2011-09-16 (×2): 10 mg via ORAL
  Filled 2011-09-15 (×2): qty 1

## 2011-09-15 MED ORDER — PRASUGREL HCL 10 MG PO TABS
10.0000 mg | ORAL_TABLET | Freq: Every day | ORAL | Status: DC
Start: 2011-09-15 — End: 2011-09-15

## 2011-09-15 MED ORDER — PRASUGREL HCL 10 MG PO TABS
10.0000 mg | ORAL_TABLET | Freq: Every day | ORAL | Status: DC
  Filled 2011-09-15: qty 1

## 2011-09-15 MED ORDER — MIDAZOLAM HCL 2 MG/2ML IJ SOLN
INTRAMUSCULAR | Status: AC
  Filled 2011-09-15: qty 2

## 2011-09-15 MED ORDER — HEPARIN (PORCINE) IN NACL 100-0.45 UNIT/ML-% IJ SOLN
1700.0000 [IU]/h | INTRAMUSCULAR | Status: DC
  Administered 2011-09-15: 25000 [IU] via INTRAVENOUS
  Filled 2011-09-15 (×3): qty 250

## 2011-09-15 MED ORDER — LIDOCAINE HCL (PF) 1 % IJ SOLN
INTRAMUSCULAR | Status: AC
  Filled 2011-09-15: qty 30

## 2011-09-15 MED ORDER — ACETAMINOPHEN 325 MG PO TABS: 650.0000 mg | ORAL_TABLET | ORAL | Status: DC | PRN

## 2011-09-15 MED ORDER — DIAZEPAM 5 MG PO TABS
5.0000 mg | ORAL_TABLET | ORAL | Status: DC
  Filled 2011-09-15: qty 1

## 2011-09-15 MED ORDER — PRASUGREL HCL 10 MG PO TABS
10.0000 mg | ORAL_TABLET | Freq: Every day | ORAL | Status: DC
  Administered 2011-09-15 – 2011-09-16 (×2): 10 mg via ORAL
  Filled 2011-09-15 (×2): qty 1

## 2011-09-15 MED ORDER — ASPIRIN EC 81 MG PO TBEC
81.0000 mg | DELAYED_RELEASE_TABLET | Freq: Every day | ORAL | Status: DC
Start: 2011-09-15 — End: 2011-09-15
  Filled 2011-09-15: qty 1

## 2011-09-15 MED ORDER — CARVEDILOL 3.125 MG PO TABS
3.1250 mg | ORAL_TABLET | Freq: Two times a day (BID) | ORAL | Status: DC
  Administered 2011-09-15 – 2011-09-16 (×2): 3.125 mg via ORAL
  Filled 2011-09-15 (×4): qty 1

## 2011-09-15 MED ORDER — SODIUM CHLORIDE 0.9 % IV SOLN
1.0000 mL/kg/h | INTRAVENOUS | Status: DC
  Administered 2011-09-15: 1 mL/kg/h via INTRAVENOUS

## 2011-09-15 MED ORDER — HEPARIN (PORCINE) IN NACL 100-0.45 UNIT/ML-% IJ SOLN
INTRAMUSCULAR | Status: AC
  Administered 2011-09-15: 25000 [IU] via INTRAVENOUS
  Filled 2011-09-15: qty 250

## 2011-09-15 MED ORDER — ALLOPURINOL 300 MG PO TABS
300.0000 mg | ORAL_TABLET | Freq: Every day | ORAL | Status: DC
  Administered 2011-09-15 – 2011-09-16 (×2): 300 mg via ORAL
  Filled 2011-09-15 (×2): qty 1

## 2011-09-15 MED ORDER — SODIUM CHLORIDE 0.9 % IV SOLN: 1.0000 mL/kg/h | INTRAVENOUS | Status: DC

## 2011-09-15 MED ORDER — ASPIRIN 81 MG PO CHEW: 324.0000 mg | CHEWABLE_TABLET | ORAL | Status: DC

## 2011-09-15 MED ORDER — SODIUM CHLORIDE 0.9 % IV SOLN
INTRAVENOUS | Status: AC
  Administered 2011-09-15: 21:00:00 via INTRAVENOUS

## 2011-09-15 MED ORDER — ASPIRIN EC 81 MG PO TBEC
81.0000 mg | DELAYED_RELEASE_TABLET | Freq: Every day | ORAL | Status: DC
  Filled 2011-09-15: qty 1

## 2011-09-15 MED ORDER — LISINOPRIL 10 MG PO TABS
10.0000 mg | ORAL_TABLET | Freq: Every day | ORAL | Status: DC
  Filled 2011-09-15: qty 1

## 2011-09-15 MED ORDER — SODIUM CHLORIDE 0.9 % IJ SOLN
3.0000 mL | Freq: Two times a day (BID) | INTRAMUSCULAR | Status: DC
  Administered 2011-09-15: 3 mL via INTRAVENOUS
  Administered 2011-09-15: 21:00:00 via INTRAVENOUS
  Administered 2011-09-16: 3 mL via INTRAVENOUS

## 2011-09-15 MED ORDER — CARVEDILOL 3.125 MG PO TABS
3.1250 mg | ORAL_TABLET | Freq: Two times a day (BID) | ORAL | Status: DC
  Administered 2011-09-15: 3.125 mg via ORAL
  Filled 2011-09-15 (×3): qty 1

## 2011-09-15 MED ORDER — ASPIRIN EC 81 MG PO TBEC
81.0000 mg | DELAYED_RELEASE_TABLET | Freq: Every day | ORAL | Status: DC
  Administered 2011-09-16: 81 mg via ORAL
  Filled 2011-09-15: qty 1

## 2011-09-15 MED ORDER — HEPARIN BOLUS VIA INFUSION
4000.0000 [IU] | Freq: Once | INTRAVENOUS | Status: AC
  Administered 2011-09-15: 4000 [IU] via INTRAVENOUS

## 2011-09-15 NOTE — Interval H&P Note (Signed)
History and Physical Interval Note:  09/15/2011 6:32 PM  Austin Bright  has presented today for surgery, with the diagnosis of cp  The various methods of treatment have been discussed with the patient and family. After consideration of risks, benefits and other options for treatment, the patient has consented to  Procedure(s) (LRB): LEFT AND RIGHT HEART CATHETERIZATION WITH CORONARY ANGIOGRAM (N/A) as a surgical intervention .  The patient's history has been reviewed, patient examined, no change in status, stable for surgery.  I have reviewed the patient's chart and labs.  Questions were answered to the patient's satisfaction.     [name]

## 2011-09-15 NOTE — Progress Notes (Signed)
ANTICOAGULATION CONSULT NOTE - Follow Up Consult  Pharmacy Consult for Heparin Indication: chest pain/ACS  No Known Allergies  Patient Measurements: Height: 5\' 10"  (177.8 cm) Weight: 215 lb (97.523 kg) IBW/kg (Calculated) : 73  Heparin Dosing Weight: 93.1kg  Vital Signs: Temp: 98.6 F (37 C) (08/27 0535) Temp src: Oral (08/27 0535) BP: 125/82 mmHg (08/27 0535) Pulse Rate: 68  (08/27 0535)  Labs:  Alvira Philips 09/15/11 0905 09/15/11 0327 09/15/11 0227  HGB -- -- 14.0  HCT -- -- 40.3  PLT -- -- 162  APTT -- -- --  LABPROT 15.1 13.9 --  INR 1.17 1.05 --  HEPARINUNFRC 0.10* -- --  CREATININE -- -- 1.09  CKTOTAL 90 -- --  CKMB 2.4 -- --  TROPONINI <0.30 -- <0.30    Estimated Creatinine Clearance: 80.2 ml/min (by C-G formula based on Cr of 1.09).   Medications:  Heparin 1350 units/hr  Assessment: 65yom on Heparin for r/o MI, CP. Heparin level (0.1) is subtherapeutic. No problems with IV infusion or line per RN. Noted plans for possible cath later today. - H/H and Plts wnl - No significant bleeding reported  Goal of Therapy:  Heparin level 0.3-0.7 units/ml Monitor platelets by anticoagulation protocol: Yes   Plan:  1. Heparin IV bolus 2500 units x 1 2. Increase Heparin drip to 1700 units/hr (17 ml/hr) 3. Check heparin level 6 hours after rate increase or follow-up post cath orders  Cleon Dew [phone] 09/15/2011,11:53 AM

## 2011-09-15 NOTE — Plan of Care (Signed)
Problem: Consults Goal: Tobacco Cessation referral if indicated Outcome: Completed/Met Date Met:  09/15/11 Pt non smoker, goal resolved   Problem: Phase I Progression Outcomes Goal: Anginal pain relieved Outcome: Progressing Pt a/o no c/o pain, pt on heparin gtt, pt awaiting cardiac cath, will continue to monitor Worthy Flank, RN  Goal: Voiding-avoid urinary catheter unless indicated Outcome: Completed/Met Date Met:  09/15/11 Pt a/o, independent, pt voids independently, no retention noted, pt has been NPO awaiting procedure, no c/o discomfort, will continue to monitor Worthy Flank, RN

## 2011-09-15 NOTE — Progress Notes (Signed)
Utilization Review Completed.Jamylah Marinaccio T8/27/2013   

## 2011-09-15 NOTE — ED Notes (Addendum)
Pt reporting tightness in chest, starting yesterday.  States it did subside, but began again tonight.  Pt denies pain at this time, but does have some SOB.  Reports having MI on 8/4.  Pt had stents placed after cath.   Pt denies actual pain, reports strong pressure only

## 2011-09-15 NOTE — H&P (Signed)
History and Physical   Patient ID: Austin Bright MRN: [id number], DOB/AGE: Jan 23, 1945 66 y.o. Date of Encounter: 09/15/2011  Primary Physician: Fredirick Maudlin, MD Primary Cardiologist: TW  Chief Complaint:  SOB, Chest pain  HPI: Pt had an anterior STEMI and was hospitalized 8/4-8/8, seen in office 8/20 and was complaining of fatigue. Crestor d/c'd and pt briefly improved. However, starting 8/24, developed chest tightness with exertion, 5/10, which would resolve with rest, no Rx tried. He noticed increasing DOE and a decreasing exercise tolerance. He developed orthopnea last 2 nights and last pm had PND. No LE edema. Some weight gain, about 3 pounds. [facility] and rec'd IV Lasix 40 mg. His respiratory status has remarkably improved. He is not currently having chest tightness.  Past Medical History  Diagnosis Date  . Gout   . Hypertension   . CAD (coronary artery disease)     a. Diagnosed 08/2011 with anterior STEMI due to thrombotic occlusion of mid LAD s/p thrombectomy, PTCA, DES placement 08/23/11.  . Ischemic cardiomyopathy     a. Initial EF 35% by cath 08/23/11, improved to 40-45% by echo 08/25/11.  . Bilateral pneumonia     Diagnosed after STEMI 08/2011  . Dyslipidemia   . Acute MI      Surgical History:  Past Surgical History  Procedure Date  . No past surgeries   . Coronary stent placement      I have reviewed the patient's current medications. Medication Sig  acetaminophen 325 MG tablet Take 1-2 tablets (325-650 mg total) by mouth every 4 (four) hours as needed for pain or fever.  allopurinol (ZYLOPRIM) 300 MG tablet Take 300 mg by mouth daily.  Alpha Lipoic Acid 200 MG CAPS Take 1 capsule by mouth daily.  aspirin EC 81 MG tablet Take 81 mg by mouth daily.  carvedilol (COREG) 3.125 MG tablet Take 1 tablet  by mouth 2 (two) times daily with a meal.  CINNAMON PO Take 1 capsule by mouth daily.  Coenzyme Q10  100 MG CAPS Take 1 capsule by mouth daily.  Green  Tea 150 MG CAPS Take 1 capsule by mouth daily.  lisinopril 10 MG tablet Take 1 tablet (10 mg total) by mouth daily.  nitroGLYCERIN  0.4 MG SL tablet Place 1 tablet (0.4 mg total) under the tongue every 5 (five) minutes x 3 doses as needed for chest pain.  prasugrel (EFFIENT) 10 MG TABS Take 1 tablet (10 mg total) by mouth daily.  RESVERATROL 100 MG CAPS Take 1 capsule by mouth daily.  Turmeric 500 MG CAPS Take 1 capsule by mouth daily.  vitamin C  500 MG tablet Take 500 mg by mouth daily.   Scheduled Meds:   . aspirin EC  81 mg Oral Daily  . aspirin  325 mg Oral Once  . carvedilol  3.125 mg Oral BID WC  . furosemide  40 mg Intravenous Once  . heparin      . heparin  4,000 Units Intravenous Once  . lisinopril  10 mg Oral Daily  . nitroGLYCERIN  5-200 mcg/min Intravenous Once  . prasugrel  10 mg Oral Daily  . sodium chloride  3 mL Intravenous Q12H   Continuous Infusions:   . heparin     PRN Meds:.sodium chloride, acetaminophen, ondansetron (ZOFRAN) IV, sodium chloride  Allergies: No Known Allergies   History   Social History  . Marital Status: Divorced    Spouse Name: N/A    Number  of Children: N/A  . 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 Smoking status: Former Smoker    Quit date: 08/23/1979  . Smokeless tobacco: Not on file  . Alcohol Use: 2.4 oz/week    2 Cans of beer, 2 Shots of liquor per week     occasional  . Drug Use: No  . Sexually Active: Yes   Other Topics Concern  . Not on file   Social History Narrative   Pt lives in [place], is out of work till [employer] 2013 after MI. He is an only child.   Family Status  Relation Status Death Age  . Father Deceased 38    AAA  . Mother Alive    Review of Systems: He has not been coughing since he recovered from the PNA he had with the MI. Until the last few days, he has not had increasing DOE and has been exercising regularly. He has not had fevers  or chills. He has had no bleeding issues. Full 14-point review of systems otherwise negative except as noted above.   Physical Exam: Blood pressure 125/82, pulse 68, temperature 98.6 F (37 C), temperature source Oral, resp. rate 18, height 5\' 10"  (1.778 m), weight 215 lb (97.523 kg), SpO2 96.00%. General: Well developed, well nourished, male in no acute distress. Head: Normocephalic, atraumatic, sclera non-icteric, no xanthomas, nares are without discharge. Dentition: good Neck: No carotid bruits. JVD mildly elevated at about 8 cm. No thyromegally Lungs: Good expansion bilaterally. without wheezes or rhonchi. Rales bases Heart: Regular rate and rhythm with S1 S2.  No S3 or S4.  No murmur, no rubs, or gallops appreciated. Abdomen: Soft, non-tender, non-distended with normoactive bowel sounds. No hepatomegaly. No rebound/guarding. No obvious abdominal masses. Msk:  Strength and tone appear normal for age. No joint deformities or effusions, no spine or costo-vertebral angle tenderness. Extremities: No clubbing or cyanosis. No edema.  Distal pedal pulses are 2+ in 4 extrem Neuro: Alert and oriented X 3. Moves all extremities spontaneously. No focal deficits noted. Psych:  Responds to questions appropriately with a normal affect. Skin: No rashes or lesions noted  Labs:   Lab Results  Component Value Date   WBC 8.2 09/15/2011   HGB 14.0 09/15/2011   HCT 40.3 09/15/2011   MCV 90.6 09/15/2011   PLT 162 09/15/2011    Basename 09/15/11 0327  INR 1.05    Lab 09/15/11 0227  NA 138  K 3.8  CL 105  CO2 23  BUN 17  CREATININE 1.09  CALCIUM 9.2  PROT --  BILITOT --  ALKPHOS --  ALT --  AST --  GLUCOSE 100*    Basename 09/15/11 0227  CKTOTAL --  CKMB --  TROPONINI <0.30    Pro B Natriuretic peptide (BNP)  Date/Time Value Range Status  09/15/2011  2:27 AM 1846.0* 0 - 125 pg/mL Final  08/23/2011  9:13 AM 372.1* 0 - 125 pg/mL Final    Radiology/Studies:  Dg Chest Portable 1  View 09/15/2011  *RADIOLOGY REPORT*  Clinical Data: Chest pain  PORTABLE CHEST - 1 VIEW  Comparison: 09/07/2011  Findings: Cardiomegaly.  Central vascular congestion.  Increased interstitial prominence and mild lung base opacities.  No pleural effusion or pneumothorax.  No acute osseous finding.  IMPRESSION: Cardiomegaly with increased interstitial prominence; interstitial edema (favored) versus atypical infection.   Original Report Authenticated By: Waneta Martins, M.D.    Cardiac  Cath:  08/23/2011 Left Main: Normal in size with no significant disease.  Left Anterior Descending (LAD): Large in size with 100% thrombotic occlusion in the midsegment before the first diagonal. Large thrombus is noted with TIMI 0 flow. The rest of the LAD has only minor irregularities.  1st diagonal (D1): Normal in size and free of significant disease.  2nd diagonal (D2): Medium size 50% ostial disease.  3rd diagonal (D3): Very small in size.  Circumflex (LCx): Large in size and nondominant. The vessel has minor irregularities.  1st obtuse marginal: Medium in size without significant disease.  2nd obtuse marginal: Normal in size with minor irregularities.  3rd obtuse marginal: Large in size without significant disease.  Right Coronary Artery: Normal in size and dominant. There is 10% proximal stenosis. The rest of the vessel has minor irregularities.  posterior descending artery: Normal in size and free of significant disease.  posterior lateral branch: Overall small in size branches.  RV marginal is relatively large and free of significant disease. Left ventriculography: LVEF is estimated at 35 %, there is no significant mitral regurgitation . Severe mid to distal anterior wall, apical and distal inferior wall hypokinesis.  PCI Data:  Vessel - mid LAD/Segment - 13  Percent Stenosis (pre) 100%  TIMI-flow 1  Stent 3.5 x 15 Xience drug-eluting stent postdilated with a 4.0 noncompliant balloon  Percent Stenosis  (post) 0%  TIMI-flow (post) 3  Echo: 08/24/2011 Study Conclusions - Left ventricle: The cavity size was normal. Systolic function was mildly to moderately reduced. The estimated ejection fraction was in the range of 40% to 45%. There is severe hypokinesis of the mid-distalanteroseptal and apical myocardium. Features are consistent with a pseudonormal left ventricular filling pattern, with concomitant abnormal relaxation and increased filling pressure (grade 2 diastolic dysfunction). Doppler parameters are consistent with high ventricular filling pressure. - Left atrium: The atrium was mildly dilated. - Right ventricle: Systolic pressure was moderately to severely increased. - Pulmonary arteries: PA peak pressure: 47mm Hg (S).   ECG:  15-Sep-2011 02:09:51  Normal sinus rhythm Left anterior fascicular block Anteroseptal infarct , age undetermined T wave abnormality, consider lateral ischemia Vent. rate 66 BPM PR interval 150 ms QRS duration 78 ms QT/QTc 448/469 ms P-R-T axes 17 -63 133  ASSESSMENT AND PLAN:  Principal Problem:  *Chest pain with high risk of acute coronary syndrome - Admit, r/o MI, MD to decide on cath. Angina with MI was severe HA but he is at increased risk of ISR.  Active Problems:  Systolic and diastolic CHF, acute - Rec'd Lasix 40 mg IV at [facility], May need further diuresis, consider Lasix 40 mg IV BID for about 24-48 hours.   CAD (coronary artery disease) - No other critical disease but is at elevated risk of ISR, no ST elevation so cont current Rx. MD advise on timing of cath.   Dyslipidemia - had muscle aches on statin. Go to pravastatin 20 mg and increase if tolerated.  SignedBjorn Loser Barrett PA-C 09/15/2011, 6:46 AM

## 2011-09-15 NOTE — Progress Notes (Signed)
*  PRELIMINARY RESULTS* Echocardiogram 2D Echocardiogram has been performed.  Austin Bright 09/15/2011, 2:12 PM

## 2011-09-15 NOTE — CV Procedure (Signed)
   Cardiac Catheterization Procedure Note  Name: Austin Bright MRN: [id number] DOB: Mar 31, 1945  Procedure: Right Heart Cath, Left Heart Cath, Selective Coronary Angiography, LV angiography  Indication: CHF post    Procedural Details: The right groin was prepped, draped, and anesthetized with 1% lidocaine. Using the modified Seldinger technique a 5 French sheath was placed in the right femoral artery and a 7 French sheath was placed in the right femoral vein. A Swan-Ganz catheter was used for the right heart catheterization. Standard protocol was followed for recording of right heart pressures and sampling of oxygen saturations. Fick cardiac output was calculated. Standard Judkins catheters were used for selective coronary angiography and left ventriculography. There were no immediate procedural complications. The patient was transferred to the post catheterization recovery area for further monitoring.  Procedural Findings: Hemodynamics RA 8 RV 44/8 PA 46/21 (32) PCWP 22 LV 137/32 AO 135/87 (109)  Oxygen saturations: PA 62% AO 95% SVC 61%  Cardiac Output (Fick) 4.57 L/min  Cardiac Index (Fick) 2.1 L/min/M2 Cardiac Output (thermo)  4.44 L/min Cardiac Index (Thermo)  2.06 L/min/M2   Coronary angiography: Coronary dominance: right  Left mainstem: No significant obstruction.  Left anterior descending (LAD): The vessel wraps the apex.  Just past the septal, there is a patent stent with TIMI 3 flow, and minimal narrowing.  It stops at the bifurcation of the diagonal and distal LAD.  There is eccentric narrowing of perhaps 40% just distal to the diagonal in the LAD.  The distal vessel wraps the apex.    Left circumflex (LCx): Provides a tiny ramus or OM1.  There is an OM2 which is small and has perhaps 30-40% narrowing.  The AV circ supplies a very large PLA that is free of significant disease.    Right coronary artery (RCA): moderate size vessel with mild luminal irregularity.  The PDA is free of significant disease.   Left ventriculography: Left ventricular systolic function is reduced.  The anterolateral apical segment is akinetic.  Estimate of the EF is 35-40%.     Final Conclusions:   1.  Continued patency of the LAD at the stent site. 2.  Elevated LVEDP 3.  Reduced EF with estimate 35-40%.    Recommendations:  1.  Probably low dose diuretics, continue ACE and titrate and beta blockers.   2.  Follow up EF.  Will recheck echo to exclude LV mural thrombus.     [name] 09/15/2011, 7:33 PM

## 2011-09-15 NOTE — Progress Notes (Signed)
ANTICOAGULATION CONSULT NOTE - Initial Consult  Pharmacy Consult for Heparin Indication: chest pain/ACS  No Known Allergies  Patient Measurements: Height: 5\' 10"  (177.8 cm) Weight: 214 lb (97.07 kg) IBW/kg (Calculated) : 73  Heparin Dosing Weight: 85  Vital Signs: Temp: 98 F (36.7 C) (08/27 0203) Temp src: Oral (08/27 0203) BP: 144/97 mmHg (08/27 0203) Pulse Rate: 72  (08/27 0203)  Labs:  Austin Bright 09/15/11 0227  HGB 14.0  HCT 40.3  PLT 162  APTT --  LABPROT --  INR --  HEPARINUNFRC --  CREATININE 1.09  CKTOTAL --  CKMB --  TROPONINI <0.30    Estimated Creatinine Clearance: 80 ml/min (by C-G formula based on Cr of 1.09).   Medical History: Past Medical History  Diagnosis Date  . Gout   . Hypertension   . CAD (coronary artery disease)     a. Diagnosed 08/2011 with anterior STEMI due to thrombotic occlusion of mid LAD s/p thrombectomy, PTCA, DES placement 08/23/11.  . Ischemic cardiomyopathy     a. Initial EF 35% by cath 08/23/11, improved to 40-45% by echo 08/25/11.  . Bilateral pneumonia     Diagnosed after STEMI 08/2011  . Dyslipidemia   . Acute MI     Medications:  Scheduled:    . aspirin  325 mg Oral Once  . furosemide  40 mg Intravenous Once  . heparin      . nitroGLYCERIN  5-200 mcg/min Intravenous Once    Assessment: Okay for Protocol  Goal of Therapy:  Heparin level 0.3-0.7 units/ml Monitor platelets by anticoagulation protocol: Yes   Plan:  Give 4000 units bolus x 1 Start heparin infusion at 1350 units/hr Check anti-Xa level in 6-8 hours and daily while on heparin Continue to monitor H&H and platelets  Austin Bright 09/15/2011,3:20 AM

## 2011-09-15 NOTE — Care Management Note (Signed)
  Page 1 of 1   09/15/2011     3:42:07 PM   CARE MANAGEMENT NOTE 09/15/2011  Patient:  Austin Bright, Austin Bright   Account Number:  [id number]  Date Initiated:  09/15/2011  Documentation initiated by:  [facility]  Subjective/Objective Assessment:   CHF, CAD     Action/Plan:   lives at home with wife, Oren Section   Anticipated DC Date:  09/17/2011   Anticipated DC Plan:  HOME/SELF CARE      DC Planning Services  CM consult      Choice offered to / List presented to:             Status of service:  In process, will continue to follow Medicare Important Message given?   (If response is "NO", the following Medicare IM given date fields will be blank) Date Medicare IM given:   Date Additional Medicare IM given:    Discharge Disposition:    Per UR Regulation:    If discussed at Long Length of Stay Meetings, dates discussed:    Comments:  09/15/2011 1530 Pt scheduled for cardiac cath today and was resting in the room. Gave pt a Living Well with Heart Failure booklet and encouraged him to review. States he does weigh himself daily and the MD has discussed HF with him. Explained NCM will allow him to rest and NCM will continue to follow up on any d/c needs. Isidoro Donning RN CCM Case Mgmt phone [phone]

## 2011-09-15 NOTE — ED Provider Notes (Signed)
History     CSN: [id number]  Arrival date & time 09/15/11  0159   First MD Initiated Contact with Patient 09/15/11 [phone]      Chief Complaint  Patient presents with  . Chest Pain    HPI The patient underwent stenting to his LAD 3 weeks ago after presentation to the emergency department with anterior ST elevation MI.  His discharge ejection fraction was 40-45% after an initial ejection fraction of 35%.  The patient now presents with 2-3 days of exertional shortness of breath and new orthopnea.  He also reports intermittent heaviness in his chest.  At time of my evaluation the patient is having current heaviness in his chest and some shortness of breath at rest.  He reports he had been walking on the treadmill up to 25 minutes however over the past several days his had and increasing shortness of breath while doing this.  He is on effient.    Past Medical History  Diagnosis Date  . Gout   . Hypertension   . CAD (coronary artery disease)     a. Diagnosed 08/2011 with anterior STEMI due to thrombotic occlusion of mid LAD s/p thrombectomy, PTCA, DES placement 08/23/11.  . Ischemic cardiomyopathy     a. Initial EF 35% by cath 08/23/11, improved to 40-45% by echo 08/25/11.  . Bilateral pneumonia     Diagnosed after STEMI 08/2011  . Dyslipidemia   . Acute MI     Past Surgical History  Procedure Date  . No past surgeries   . Coronary stent placement     History reviewed. No pertinent family history.  History  Substance Use Topics  . Smoking status: Former Smoker    Quit date: 08/23/1979  . Smokeless tobacco: Not on file  . Alcohol Use: 2.4 oz/week    2 Cans of beer, 2 Shots of liquor per week     occasional      Review of Systems  All other systems reviewed and are negative.    Allergies  Review of patient's allergies indicates no known allergies.  Home Medications   Current Outpatient Rx  Name Route Sig Dispense Refill  . ACETAMINOPHEN 325 MG PO TABS Oral Take 1-2  tablets (325-650 mg total) by mouth every 4 (four) hours as needed for pain or fever.    . ALLOPURINOL 300 MG PO TABS Oral Take 300 mg by mouth daily.    . ALPHA LIPOIC ACID 200 MG PO CAPS Oral Take 1 capsule by mouth daily.    . ASPIRIN EC 81 MG PO TBEC Oral Take 81 mg by mouth daily.    Marland Kitchen CARVEDILOL 3.125 MG PO TABS Oral Take 1 tablet (3.125 mg total) by mouth 2 (two) times daily with a meal. 60 tablet 6  . CINNAMON PO Oral Take 1 capsule by mouth daily.    . CO Q-10 100 MG PO CAPS Oral Take 1 capsule by mouth daily.    Marland Kitchen GREEN TEA 150 MG PO CAPS Oral Take 1 capsule by mouth daily.    Marland Kitchen LISINOPRIL 10 MG PO TABS Oral Take 1 tablet (10 mg total) by mouth daily. 30 tablet 6  . NITROGLYCERIN 0.4 MG SL SUBL Sublingual Place 1 tablet (0.4 mg total) under the tongue every 5 (five) minutes x 3 doses as needed for chest pain. 25 tablet 4  . PRASUGREL HCL 10 MG PO TABS Oral Take 1 tablet (10 mg total) by mouth daily. 30 tablet 6  . RESVERATROL  100 MG PO CAPS Oral Take 1 capsule by mouth daily.    . TURMERIC 500 MG PO CAPS Oral Take 1 capsule by mouth daily.    Marland Kitchen VITAMIN C 500 MG PO TABS Oral Take 500 mg by mouth daily.      BP 144/97  Pulse 72  Temp 98 F (36.7 C) (Oral)  Resp 18  Ht 5\' 10"  (1.778 m)  Wt 214 lb (97.07 kg)  BMI 30.71 kg/m2  SpO2 99%  Physical Exam  Nursing note and vitals reviewed. Constitutional: He is oriented to person, place, and time. He appears well-developed and well-nourished.  HENT:  Head: Normocephalic and atraumatic.  Eyes: EOM are normal.  Neck: Normal range of motion.  Cardiovascular: Normal rate, regular rhythm, normal heart sounds and intact distal pulses.   Pulmonary/Chest: Effort normal and breath sounds normal. No respiratory distress.  Abdominal: Soft. He exhibits no distension. There is no tenderness.  Musculoskeletal: Normal range of motion.  Neurological: He is alert and oriented to person, place, and time.  Skin: Skin is warm and dry.    Psychiatric: He has a normal mood and affect. Judgment normal.    ED Course  Procedures (including critical care time)   Date: 09/15/2011 0209  Rate: 64   Rhythm: normal sinus rhythm  QRS Axis: normal  Intervals: normal  ST/T Wave abnormalities: Anterior Q waves.  Anterolateral T wave inversion with nonspecific ST changes  Conduction Disutrbances: none  Narrative Interpretation:   Old EKG Reviewed: Lateral T waves appear new since prior EKG    Date: 09/15/2011 0238  Rate: 65  Rhythm: normal sinus rhythm  QRS Axis: normal  Intervals: normal  ST/T Wave abnormalities: Anterior Q waves.  Anterolateral T wave inversion with nonspecific ST changes  Conduction Disutrbances: none  Narrative Interpretation:   Old EKG Reviewed: Lateral T waves appear new since prior EKG      Labs Reviewed  BASIC METABOLIC PANEL - Abnormal; Notable for the following:    Glucose, Bld 100 (*)     GFR calc non Af Amer 70 (*)     GFR calc Af Amer 81 (*)     All other components within normal limits  PRO B NATRIURETIC PEPTIDE - Abnormal; Notable for the following:    Pro B Natriuretic peptide (BNP) 1846.0 (*)     All other components within normal limits  CBC  TROPONIN I   Dg Chest Portable 1 View  09/15/2011  *RADIOLOGY REPORT*  Clinical Data: Chest pain  PORTABLE CHEST - 1 VIEW  Comparison: 09/07/2011  Findings: Cardiomegaly.  Central vascular congestion.  Increased interstitial prominence and mild lung base opacities.  No pleural effusion or pneumothorax.  No acute osseous finding.  IMPRESSION: Cardiomegaly with increased interstitial prominence; interstitial edema (favored) versus atypical infection.   Original Report Authenticated By: Waneta Martins, M.D.     I personally reviewed the imaging tests through PACS system I reviewed available ER/hospitalization records thought the EMR   1. Unstable angina   2. CHF (congestive heart failure)       MDM  Patient has symptoms concerning  for unstable angina and congestive heart failure.  He has noted a BNP and interstitial edema on chest x-ray.  IV Lasix now.  His chest heaviness is completely resolved with nitroglycerin.  The patient be initiated on heparin at this time.  Aspirin given.  Repeat EKG as the patient's chest pain became worse demonstrates ongoing lateral T wave inversions without ST elevation.  The patient be transferred to [facility] we'll be is seen and evaluated by the cardiologist.  The patient is pain-free at this time.  Shortness of breath has resolved with nitroglycerin.        Lyanne Co, MD 09/15/11 [phone]

## 2011-09-15 NOTE — Consult Note (Signed)
See previous note which i can not addend  1) CHF 2) CP assoc with #1 3) s/p recent AMI with LAD stent and no significant residual disease  I am concerned with SOB and recurrent CP and new loss of lateral Rwaves that there could be progression of CAD,  The likelihood is low, but with recurrent hospitalization I think appropriae for certainty as well to proceed with cath  He understands alternatives and agrees to proceeding He remains volume overloaded and will continue diuresis but will wait until after cath, hpefully today  Will need to try again with statins later, and will defer to Dr Imagene Gurney  Have tried to contact his this am but he is unavailable

## 2011-09-16 LAB — BASIC METABOLIC PANEL
Chloride: 105 mEq/L (ref 96–112)
Creatinine, Ser: 1.09 mg/dL (ref 0.50–1.35)
GFR calc Af Amer: 81 mL/min — ABNORMAL LOW (ref >90)
GFR calc non Af Amer: 70 mL/min — ABNORMAL LOW (ref >90)
Potassium: 4 mEq/L (ref 3.5–5.1)

## 2011-09-16 LAB — POCT I-STAT 3, VENOUS BLOOD GAS (G3P V)
Acid-base deficit: 4 mmol/L — ABNORMAL HIGH (ref 0.0–2.0)
O2 Saturation: 61 %
O2 Saturation: 62 %
TCO2: 22 mmol/L (ref 0–100)
pO2, Ven: 32 mmHg (ref 30.0–45.0)

## 2011-09-16 LAB — POCT I-STAT 3, ART BLOOD GAS (G3+)
O2 Saturation: 94 %
TCO2: 25 mmol/L (ref 0–100)

## 2011-09-16 LAB — CBC
MCHC: 35.1 g/dL (ref 30.0–36.0)
Platelets: 135 10*3/uL — ABNORMAL LOW (ref 150–400)
RDW: 13 % (ref 11.5–15.5)
WBC: 6 10*3/uL (ref 4.0–10.5)

## 2011-09-16 MED ORDER — FUROSEMIDE 40 MG PO TABS
40.0000 mg | ORAL_TABLET | Freq: Every day | ORAL | Status: DC
  Administered 2011-09-16: 40 mg via ORAL
  Filled 2011-09-16: qty 1

## 2011-09-16 MED ORDER — FUROSEMIDE 20 MG PO TABS: 20.0000 mg | ORAL_TABLET | Freq: Every day | ORAL | Status: DC

## 2011-09-16 NOTE — Discharge Summary (Signed)
CARDIOLOGY DISCHARGE SUMMARY    Patient ID: Austin Bright,  MRN: [id number], DOB/AGE: 23-Dec-1945 66 y.o.  Admit date: 09/15/2011 Discharge date: 09/16/2011  Primary Care Physician: Austin Baars, MD Primary Cardiologist: Austin Castle, MD  Primary Discharge Diagnosis:  1. Acute on chronic systolic HF  Secondary Discharge Diagnoses:  1. Recently diagnosed ischemic CM, EF 40% (08/23/2011) 2. Recently diagnosed CAD, s/p anterior STEMI due to thrombotic occlusion of mid LAD s/p thrombectomy, PTCA, DES placement (08/23/11) 3. Recent pneumonia 4. HTN 5. Dyslipidemia 6. Gout  Procedures This Admission:  1. Right Heart Cath, Left Heart Cath, Selective Coronary Angiography, LV angiography 09/15/2011 Procedural Findings:  Hemodynamics  RA 8  RV 44/8  PA 46/21 (32)  PCWP 22  LV 137/32  AO 135/87 (109)  Oxygen saturations:  PA 62%  AO 95%  SVC 61%  Cardiac Output (Fick) 4.57 L/min  Cardiac Index (Fick) 2.1 L/min/M2  Cardiac Output (thermo) 4.44 L/min  Cardiac Index (Thermo) 2.06 L/min/M2  Coronary angiography:  Coronary dominance: right  Left mainstem: No significant obstruction.  Left anterior descending (LAD): The vessel wraps the apex. Just past the septal, there is a patent stent with TIMI 3 flow, and minimal narrowing. It stops at the bifurcation of the diagonal and distal LAD. There is eccentric narrowing of perhaps 40% just distal to the diagonal in the LAD. The distal vessel wraps the apex.  Left circumflex (LCx): Provides a tiny ramus or OM1. There is an OM2 which is small and has perhaps 30-40% narrowing. The AV circ supplies a very large PLA that is free of significant disease.  Right coronary artery (RCA): moderate size vessel with mild luminal irregularity. The PDA is free of significant disease.  Left ventriculography: Left ventricular systolic function is reduced. The anterolateral apical segment is akinetic. Estimate of the EF is 35-40%.  Final Conclusions:    1. Continued patency of the LAD at the stent site.  2. Elevated LVEDP  3. Reduced EF with estimate 35-40%.  Recommendations:  1. Probably low dose diuretics, continue ACE and titrate and beta blockers.  2. Follow up EF. Will recheck echo to exclude LV mural thrombus.   History and Hospital Course:  Austin Bright is a 66 year old gentleman with recently diagnosed CAD s/p recent anterior STEMI and was hospitalized earlier this month. He had been doing well post discharge until 09/12/2011 when he developed exertional chest tightness, SOB, orthopnea and PND. He presented to the [facility] ED and was found to have acute on chronic systolic HF. He was given IV Lasix with improvement in his respiratory status. He was transferred to [facility] for further evaluation. Given his exertional symptoms and known history of CAD s/p recent PCI, it was recommended he undergo definitive evaluation with a cardiac catheterization. He underwent left and right heart catheterization yesterday 09/15/2011 with results as outlined above. His stent was patent and the remainder of his CAD was unchanged. EF 35-40%. Continued medical therapy was recommended. This morning, Austin Bright feels much better. He is now on daily diuretic therapy. He remains hemodynamically stable. He has been seen, examined and deemed stable for discharge today by Austin Bright. He will follow-up in our [place] office in 1 week with BMET at that time. He will keep his follow-up with cardiac rehab and Austin Bright as previously scheduled.  Discharge Vitals: Blood pressure 122/78, pulse 69, temperature 97.8 F (36.6 C), temperature source Oral, resp. rate 18, height 5\' 10"  (1.778 m),  weight 213 lb 6.5 oz (96.8 kg), SpO2 97.00%.   Labs: Lab Results  Component Value Date   WBC 6.0 09/16/2011   HGB 14.1 09/16/2011   HCT 40.2 09/16/2011   MCV 90.7 09/16/2011   PLT 135* 09/16/2011     Lab 09/16/11 0800  NA 141  K 4.0  CL 105  CO2 22  BUN 17   CREATININE 1.09  CALCIUM 8.9  PROT --  BILITOT --  ALKPHOS --  ALT --  AST --  GLUCOSE 152*   Lab Results  Component Value Date   CKTOTAL 65 09/15/2011   CKMB 1.8 09/15/2011   TROPONINI <0.30 09/15/2011     Basename 09/15/11 0905  INR 1.17    Disposition:  The patient is being discharged in stable condition.  Follow-up: Follow-up Information    Follow up with Austin Bright on 09/23/2011. (At 2:00 PM for hospital follow-up)    Contact information:   Therapist, music office [address] [place] [address] [phone]       Follow up with Austin Castle, MD on 12/01/2011. (At 2:00 PM)    Contact information:   [facility] [place] [facility] [address] [place] [address] [phone]        Discharge Medications:  Medication List  As of 09/16/2011 11:41 AM   TAKE these medications         allopurinol 300 MG tablet   Commonly known as: ZYLOPRIM   Take 300 mg by mouth daily.      Alpha Lipoic Acid 200 MG Caps   Take 1 capsule by mouth daily.      aspirin EC 81 MG tablet   Take 81 mg by mouth daily.      carvedilol 3.125 MG tablet   Commonly known as: COREG   Take 3.125 mg by mouth 2 (two) times daily with a meal.      CINNAMON PO   Take 1 capsule by mouth daily.      Co Q-10 100 MG Caps   Take 1 capsule by mouth daily.      furosemide 20 MG tablet   Commonly known as: LASIX   Take 1 tablet (20 mg total) by mouth daily.      Green Tea 150 MG Caps   Take 1 capsule by mouth daily.      lisinopril 10 MG tablet   Commonly known as: PRINIVIL,ZESTRIL   Take 10 mg by mouth daily.      nitroGLYCERIN 0.4 MG SL tablet   Commonly known as: NITROSTAT   Place 0.4 mg under the tongue every 5 (five) minutes as needed. For chest pain.      prasugrel 10 MG Tabs   Commonly known as: EFFIENT   Take 10 mg by mouth daily.      RESVERATROL 100 MG Caps   Take 1 capsule by mouth daily.       Turmeric 500 MG Caps   Take 1 capsule by mouth daily.      vitamin C 500 MG tablet   Commonly known as: ASCORBIC ACID   Take 500 mg by mouth daily.          Duration of Discharge Encounter: Greater than 30 minutes including physician time.  Signed, Rick Duff, PA-C 09/16/2011, 11:41 AM

## 2011-09-16 NOTE — Progress Notes (Signed)
   Patient Name: Austin Bright      SUBJECTIVE: cath results noted; consistent with acute systolic CHF   Stent patent Feels much better  Past Medical History  Diagnosis Date  . Gout   . Hypertension   . CAD (coronary artery disease)     a. Diagnosed 08/2011 with anterior STEMI due to thrombotic occlusion of mid LAD s/p thrombectomy, PTCA, DES placement 08/23/11.  . Ischemic cardiomyopathy     a. Initial EF 35% by cath 08/23/11, improved to 40-45% by echo 08/25/11.  . Bilateral pneumonia     Diagnosed after STEMI 08/2011  . Dyslipidemia   . Acute MI   . Shortness of breath   . CHF (congestive heart failure)   . Arthritis     PHYSICAL EXAM Filed Vitals:   09/15/11 1458 09/15/11 1808 09/15/11 2108 09/16/11 0641  BP: 118/80  131/81 117/77  Pulse: 60 88 71 69  Temp: 98.3 F (36.8 C)  98.2 F (36.8 C) 97.8 F (36.6 C)  TempSrc: Oral  Oral Oral  Resp: 19  19 18   Height:      Weight:    213 lb 6.5 oz (96.8 kg)  SpO2: 98%  96% 97%   Well developed and nourished in no acute distress HENT normal Neck supple with JVP-8-9 Clear Regular rate and rhythm, no murmurs or gallops Abd-soft with active BS No Clubbing cyanosis edema Skin-warm and dry A & Oriented  Grossly normal sensory and motor function  TELEMETRY: Reviewed telemetry pt in nsr :    Intake/Output Summary (Last 24 hours) at 09/16/11 0819 Last data filed at 09/16/11 0752  Gross per 24 hour  Intake    483 ml  Output    775 ml  Net   -292 ml    LABS: Basic Metabolic Panel:  Lab 09/15/11 5784 09/15/11 0227  NA -- 138  K -- 3.8  CL -- 105  CO2 -- 23  GLUCOSE -- 100*  BUN -- 17  CREATININE -- 1.09  CALCIUM -- 9.2  MG 2.1 --  PHOS -- --   Cardiac Enzymes:  Basename 09/15/11 2050 09/15/11 0905 09/15/11 0227  CKTOTAL 65 90 --  CKMB 1.8 2.4 --  CKMBINDEX -- -- --  TROPONINI <0.30 <0.30 <0.30   CBC:  Lab 09/15/11 0227  WBC 8.2  NEUTROABS --  HGB 14.0  HCT 40.3  MCV 90.6  PLT 162    PROTIME:  Basename 09/15/11 0905 09/15/11 0327  LABPROT 15.1 13.9  INR 1.17 1.05    Fasting Lipid Panel: No results found for this basename: CHOL,HDL,LDLCALC,TRIG,CHOLHDL,LDLDIRECT in the last 72 hours Thyroid Function Tests:  Basename 09/15/11 0905  TSH 1.000  T4TOTAL --  T3FREE --  THYROIDAB --      ASSESSMENT AND PLAN:  Patient Active Hospital Problem List: Chest pain with high risk of acute coronary syndrome (09/15/2011)  CAD (coronary artery disease) (08/24/2011)   Systolic and diastolic CHF, acute (09/15/2011)  R/o for ACS/MI and cath consistent with acute resp failure 2/2 CHF w accompanying chest pain Will add diuretic lasix 20 at discharge  augment K intake F/u NP/PA Colon next week with bmet  Signed, Sherryl Manges MD  09/16/2011

## 2011-09-16 NOTE — Progress Notes (Signed)
All d/c instructions given and explained to pt.  Verbalized understanding.   Escorted off floor via w/c for d/c, wife with pt.

## 2011-09-23 ENCOUNTER — Encounter: Payer: Self-pay | Admitting: *Deleted

## 2011-09-23 ENCOUNTER — Encounter: Payer: Self-pay | Admitting: Adult Health

## 2011-09-23 ENCOUNTER — Ambulatory Visit (INDEPENDENT_AMBULATORY_CARE_PROVIDER_SITE_OTHER): Payer: BC Managed Care – PPO | Admitting: Adult Health

## 2011-09-23 VITALS — BP 100/60 | HR 64 | Ht 70.0 in | Wt 208.0 lb

## 2011-09-23 DIAGNOSIS — I5041 Acute combined systolic (congestive) and diastolic (congestive) heart failure: Secondary | ICD-10-CM

## 2011-09-23 DIAGNOSIS — I2510 Atherosclerotic heart disease of native coronary artery without angina pectoris: Secondary | ICD-10-CM

## 2011-09-23 DIAGNOSIS — I509 Heart failure, unspecified: Secondary | ICD-10-CM

## 2011-09-23 NOTE — Assessment & Plan Note (Addendum)
I do not find evidence of fluid overload at this assessment. He is very meticulous concerning how much sodium he is taking in, weighing daily, and increasing his activity slowly. He states he walks on a treadmill at 2.5 miles an hour for 25 minutes without fatigue. I will make no changes at this time, and have him followup with Dr. wall in November as already planned. He will need to have an echocardiogram completed at that appointment for reevaluation of LV function with hopeful improvement in fx. he will continue beta blocker, ACE inhibitor, and Lasix.

## 2011-09-23 NOTE — Assessment & Plan Note (Signed)
Repeat cardiac catheterization completed 2 weeks after initial PCI of the LAD did reveal patent stent. He was placed on diuretics with elevated LVEDP and an EF of 35-40%. We will continue him on his current medication regimen. He is advised to weigh himself daily. Continue tubal avoid salt intake. 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 He is advised not to lift anything greater than 10 pounds. He is also advised to keep followup appointment with cardiac rehabilitation. He will see Dr. Daleen Squibb in November, at a previously scheduled appointment.

## 2011-09-23 NOTE — Patient Instructions (Addendum)
Keep your already scheduled appointment with Dr. Daleen Squibb in November.

## 2011-09-23 NOTE — Progress Notes (Signed)
HPI: [name] is a 66 year old patient of Dr. Elijah Birk wall that we are following for ongoing assessment and treatment of CAD status post anterior septal MI with PCI placement due to thrombotic occlusion of the mid LAD on 05/23/2011. At that time his ejection fraction was found to be 35%. Unfortunately 2 weeks after this procedure the patient was readmitted for acute on chronic systolic CHF after dietary noncompliance concerning salt intake. Cardiac catheterization was repeated, both right and left heart cath revealing continued patency of the LAD at the stent site elevated LVEDP reduced EF of 35-40%. The patient was started on low-dose diuretic and placed on a low-sodium diet with instructions to weight daily.   The patient comes today anxious to return to work. He is very particular about what medications he is taking, what activity he wishes to do, and is very much determined to be in control of his health status and treatment. 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 He has not yet started cardiac rehabilitation. He is eating his sodium intake to less than 2000 mg a day. He actually keeps a count of how much sodium intake he is getting each day.    He continues to have some fatigue, but has not had any lower extremity edema, dyspnea on exertion, or chest pressure. He is medically compliant. No Known Allergies  Current Outpatient Prescriptions  Medication Sig Dispense Refill  . allopurinol (ZYLOPRIM) 300 MG tablet Take 300 mg by mouth daily.      Allen Kell Lipoic Acid 200 MG CAPS Take 1 capsule by mouth daily.      Marland Kitchen aspirin EC 81 MG tablet Take 81 mg by mouth daily.      . carvedilol (COREG) 3.125 MG tablet Take 3.125 mg by mouth 2 (two) times daily with a meal.      . CINNAMON PO Take 1 capsule by mouth daily.      . Coenzyme Q10 (CO Q-10) 100 MG CAPS Take 1 capsule by mouth daily.      . furosemide (LASIX) 20 MG tablet Take 1  tablet (20 mg total) by mouth daily.  30 tablet  6  . Green Tea 150 MG CAPS Take 1 capsule by mouth daily.      Marland Kitchen lisinopril (PRINIVIL,ZESTRIL) 10 MG tablet Take 10 mg by mouth daily.      . nitroGLYCERIN (NITROSTAT) 0.4 MG SL tablet Place 0.4 mg under the tongue every 5 (five) minutes as needed. For chest pain.      . prasugrel (EFFIENT) 10 MG TABS Take 10 mg by mouth daily.      Marland Kitchen RESVERATROL 100 MG CAPS Take 1 capsule by mouth daily.      . Turmeric 500 MG CAPS Take 1 capsule by mouth daily.      . vitamin C (ASCORBIC ACID) 500 MG tablet Take 500 mg by mouth daily.        Past Medical History  Diagnosis Date  . Gout   . Hypertension   . CAD (coronary artery disease)     a. Diagnosed 08/2011 with anterior STEMI due to thrombotic occlusion of mid LAD s/p thrombectomy, PTCA, DES placement 08/23/11.  . Ischemic cardiomyopathy     a. Initial EF 35% by cath 08/23/11, improved to 40-45% by echo 08/25/11.  . Bilateral pneumonia     Diagnosed after STEMI 08/2011  . Dyslipidemia   .  Acute MI   . Shortness of breath   . CHF (congestive heart failure)   . Arthritis     Past Surgical History  Procedure Date  . No past surgeries   . Coronary stent placement   . Cystectomy 1969  . Carpel tunnel 2002    AVW:[id number] of systems complete and found to be negative unless listed above PHYSICAL EXAM BP 100/60  Pulse 64  Ht 5\' 10"  (1.778 m)  Wt 208 lb (94.348 kg)  BMI 29.84 kg/m2  General: Well developed, well nourished, in no acute distress Head: Eyes PERRLA, No xanthomas.   Normal cephalic and atramatic  Lungs: Clear bilaterally to auscultation and percussion. Heart: HRRR S1 S2, distant, without MRG.  Pulses are 2+ & equal.            No carotid bruit. No JVD.  No abdominal bruits. No femoral bruits. Abdomen: Bowel sounds are positive, abdomen soft and non-tender without masses or                  Hernia's noted. Msk:  Back normal, normal gait. Normal strength and tone for age. Extremities: No  clubbing, cyanosis or edema.  DP +1 Neuro: Alert and oriented X 3. Psych:  Good affect, responds appropriately   ASSESSMENT AND PLAN

## 2011-09-24 ENCOUNTER — Encounter (HOSPITAL_COMMUNITY)
Admission: RE | Admit: 2011-09-24 | Discharge: 2011-09-24 | Disposition: A | Discharge status: Home / Self Care | Payer: BC Managed Care – PPO | Source: Ambulatory Visit | Attending: Cardiology | Admitting: Cardiology

## 2011-09-24 ENCOUNTER — Encounter (HOSPITAL_COMMUNITY): Payer: Self-pay

## 2011-09-24 DIAGNOSIS — Z5189 Encounter for other specified aftercare: Secondary | ICD-10-CM | POA: Insufficient documentation

## 2011-09-24 DIAGNOSIS — I2109 ST elevation (STEMI) myocardial infarction involving other coronary artery of anterior wall: Secondary | ICD-10-CM | POA: Insufficient documentation

## 2011-09-24 DIAGNOSIS — I2510 Atherosclerotic heart disease of native coronary artery without angina pectoris: Secondary | ICD-10-CM | POA: Insufficient documentation

## 2011-09-24 DIAGNOSIS — I10 Essential (primary) hypertension: Secondary | ICD-10-CM | POA: Insufficient documentation

## 2011-09-24 DIAGNOSIS — I509 Heart failure, unspecified: Secondary | ICD-10-CM | POA: Insufficient documentation

## 2011-09-24 NOTE — Patient Instructions (Signed)
Pt has finished orientation and is scheduled to start CR on 09/25/11 at 6:45. Pt has been instructed to arrive to class 15 minutes early for scheduled class. Pt has been instructed to wear comfortable clothing and shoes with rubber soles. Pt has been told to take their medications 1 hour prior to coming to class.  If the patient is not going to attend class, he/she has been instructed to call.

## 2011-09-24 NOTE — Progress Notes (Signed)
Patient was referred to Cardiac Rehab by Dr. Kirke Corin due to Myocardial infraction 410.90 and Stent placement V45.82. During orientation advised patient on arrival and appointment times what to wear, what to do before, during and after exercise. Reviewed attendance and class policy. Talked about inclement weather and class consultation policy. Pt is scheduled to start Cardiac Rehab on 09/25/11 at 6:45. Pt was advised to come to class 5 minutes before class starts. He was also given instructions on meeting with the dietician and attending the Family Structure classes. Pt is eager to get started.

## 2011-09-25 ENCOUNTER — Encounter (HOSPITAL_COMMUNITY)
Admission: RE | Admit: 2011-09-25 | Discharge: 2011-09-25 | Disposition: A | Discharge status: Home / Self Care | Payer: BC Managed Care – PPO | Source: Ambulatory Visit | Attending: Cardiology | Admitting: Cardiology

## 2011-09-28 ENCOUNTER — Encounter (HOSPITAL_COMMUNITY)
Admission: RE | Admit: 2011-09-28 | Discharge: 2011-09-28 | Disposition: A | Discharge status: Home / Self Care | Payer: BC Managed Care – PPO | Source: Ambulatory Visit | Attending: Cardiology | Admitting: Cardiology

## 2011-09-30 ENCOUNTER — Encounter (HOSPITAL_COMMUNITY): Payer: BC Managed Care – PPO

## 2011-10-02 ENCOUNTER — Encounter (HOSPITAL_COMMUNITY)
Admission: RE | Admit: 2011-10-02 | Discharge: 2011-10-02 | Disposition: A | Discharge status: Home / Self Care | Payer: BC Managed Care – PPO | Source: Ambulatory Visit | Attending: Cardiology | Admitting: Cardiology

## 2011-10-05 ENCOUNTER — Encounter (HOSPITAL_COMMUNITY)
Admission: RE | Admit: 2011-10-05 | Discharge: 2011-10-05 | Disposition: A | Discharge status: Home / Self Care | Payer: BC Managed Care – PPO | Source: Ambulatory Visit | Attending: Cardiology | Admitting: Cardiology

## 2011-10-07 ENCOUNTER — Encounter (HOSPITAL_COMMUNITY)
Admission: RE | Admit: 2011-10-07 | Discharge: 2011-10-07 | Disposition: A | Discharge status: Home / Self Care | Payer: BC Managed Care – PPO | Source: Ambulatory Visit | Attending: Cardiology | Admitting: Cardiology

## 2011-10-09 ENCOUNTER — Encounter (HOSPITAL_COMMUNITY)
Admission: RE | Admit: 2011-10-09 | Discharge: 2011-10-09 | Disposition: A | Discharge status: Home / Self Care | Payer: BC Managed Care – PPO | Source: Ambulatory Visit | Attending: Cardiology | Admitting: Cardiology

## 2011-10-12 ENCOUNTER — Encounter (HOSPITAL_COMMUNITY)
Admission: RE | Admit: 2011-10-12 | Discharge: 2011-10-12 | Disposition: A | Discharge status: Home / Self Care | Payer: BC Managed Care – PPO | Source: Ambulatory Visit | Attending: Cardiology | Admitting: Cardiology

## 2011-10-14 ENCOUNTER — Encounter (HOSPITAL_COMMUNITY)
Admission: RE | Admit: 2011-10-14 | Discharge: 2011-10-14 | Disposition: A | Discharge status: Home / Self Care | Payer: BC Managed Care – PPO | Source: Ambulatory Visit | Attending: Cardiology | Admitting: Cardiology

## 2011-10-16 ENCOUNTER — Encounter (HOSPITAL_COMMUNITY)
Admission: RE | Admit: 2011-10-16 | Discharge: 2011-10-16 | Disposition: A | Discharge status: Home / Self Care | Payer: BC Managed Care – PPO | Source: Ambulatory Visit | Attending: Cardiology | Admitting: Cardiology

## 2011-10-19 ENCOUNTER — Encounter (HOSPITAL_COMMUNITY)
Admission: RE | Admit: 2011-10-19 | Discharge: 2011-10-19 | Disposition: A | Discharge status: Home / Self Care | Payer: BC Managed Care – PPO | Source: Ambulatory Visit | Attending: Cardiology | Admitting: Cardiology

## 2011-10-19 ENCOUNTER — Other Ambulatory Visit (HOSPITAL_COMMUNITY): Payer: Self-pay | Admitting: Pulmonary Disease

## 2011-10-19 ENCOUNTER — Ambulatory Visit (HOSPITAL_COMMUNITY)
Admission: RE | Admit: 2011-10-19 | Discharge: 2011-10-19 | Disposition: A | Discharge status: Home / Self Care | Payer: BC Managed Care – PPO | Source: Ambulatory Visit | Attending: Pulmonary Disease | Admitting: Pulmonary Disease

## 2011-10-19 DIAGNOSIS — J449 Chronic obstructive pulmonary disease, unspecified: Secondary | ICD-10-CM | POA: Insufficient documentation

## 2011-10-19 DIAGNOSIS — J4489 Other specified chronic obstructive pulmonary disease: Secondary | ICD-10-CM | POA: Insufficient documentation

## 2011-10-21 ENCOUNTER — Encounter (HOSPITAL_COMMUNITY)
Admission: RE | Admit: 2011-10-21 | Discharge: 2011-10-21 | Disposition: A | Discharge status: Home / Self Care | Payer: Medicare Other | Source: Ambulatory Visit | Attending: Cardiology | Admitting: Cardiology

## 2011-10-21 DIAGNOSIS — I509 Heart failure, unspecified: Secondary | ICD-10-CM | POA: Insufficient documentation

## 2011-10-21 DIAGNOSIS — I10 Essential (primary) hypertension: Secondary | ICD-10-CM | POA: Insufficient documentation

## 2011-10-21 DIAGNOSIS — Z5189 Encounter for other specified aftercare: Secondary | ICD-10-CM | POA: Insufficient documentation

## 2011-10-21 DIAGNOSIS — I2109 ST elevation (STEMI) myocardial infarction involving other coronary artery of anterior wall: Secondary | ICD-10-CM | POA: Insufficient documentation

## 2011-10-21 DIAGNOSIS — I2510 Atherosclerotic heart disease of native coronary artery without angina pectoris: Secondary | ICD-10-CM | POA: Insufficient documentation

## 2011-10-23 ENCOUNTER — Encounter (HOSPITAL_COMMUNITY)
Admission: RE | Admit: 2011-10-23 | Discharge: 2011-10-23 | Disposition: A | Discharge status: Home / Self Care | Payer: Medicare Other | Source: Ambulatory Visit | Attending: Cardiology | Admitting: Cardiology

## 2011-10-26 ENCOUNTER — Encounter (HOSPITAL_COMMUNITY)
Admission: RE | Admit: 2011-10-26 | Discharge: 2011-10-26 | Disposition: A | Discharge status: Home / Self Care | Payer: Medicare Other | Source: Ambulatory Visit | Attending: Cardiology | Admitting: Cardiology

## 2011-10-28 ENCOUNTER — Encounter (HOSPITAL_COMMUNITY)
Admission: RE | Admit: 2011-10-28 | Discharge: 2011-10-28 | Disposition: A | Discharge status: Home / Self Care | Payer: Medicare Other | Source: Ambulatory Visit | Attending: Cardiology | Admitting: Cardiology

## 2011-10-30 ENCOUNTER — Encounter (HOSPITAL_COMMUNITY): Payer: Medicare Other

## 2011-11-02 ENCOUNTER — Encounter (HOSPITAL_COMMUNITY): Payer: Medicare Other

## 2011-11-04 ENCOUNTER — Encounter (HOSPITAL_COMMUNITY): Payer: Medicare Other

## 2011-11-06 ENCOUNTER — Encounter (HOSPITAL_COMMUNITY): Payer: Medicare Other

## 2011-11-09 ENCOUNTER — Encounter (HOSPITAL_COMMUNITY)
Admission: RE | Admit: 2011-11-09 | Discharge: 2011-11-09 | Disposition: A | Discharge status: Home / Self Care | Payer: Medicare Other | Source: Ambulatory Visit | Attending: Cardiology | Admitting: Cardiology

## 2011-11-11 ENCOUNTER — Ambulatory Visit (HOSPITAL_COMMUNITY)
Admission: RE | Admit: 2011-11-11 | Discharge: 2011-11-11 | Disposition: A | Discharge status: Home / Self Care | Payer: Medicare Other | Source: Ambulatory Visit | Attending: Pulmonary Disease | Admitting: Pulmonary Disease

## 2011-11-11 ENCOUNTER — Other Ambulatory Visit (HOSPITAL_COMMUNITY): Payer: Self-pay | Admitting: Pulmonary Disease

## 2011-11-11 ENCOUNTER — Encounter (HOSPITAL_COMMUNITY): Payer: Medicare Other

## 2011-11-11 DIAGNOSIS — R059 Cough, unspecified: Secondary | ICD-10-CM

## 2011-11-11 DIAGNOSIS — R05 Cough: Secondary | ICD-10-CM | POA: Insufficient documentation

## 2011-11-13 ENCOUNTER — Encounter (HOSPITAL_COMMUNITY): Payer: Medicare Other

## 2011-11-16 ENCOUNTER — Encounter (HOSPITAL_COMMUNITY): Payer: Medicare Other

## 2011-11-18 ENCOUNTER — Encounter (HOSPITAL_COMMUNITY): Payer: Medicare Other

## 2011-11-20 ENCOUNTER — Encounter (HOSPITAL_COMMUNITY): Payer: Medicare Other

## 2011-11-23 ENCOUNTER — Encounter (HOSPITAL_COMMUNITY)
Admission: RE | Admit: 2011-11-23 | Discharge: 2011-11-23 | Disposition: A | Discharge status: Home / Self Care | Payer: Medicare Other | Source: Ambulatory Visit | Attending: Cardiology | Admitting: Cardiology

## 2011-11-23 DIAGNOSIS — I2510 Atherosclerotic heart disease of native coronary artery without angina pectoris: Secondary | ICD-10-CM | POA: Insufficient documentation

## 2011-11-23 DIAGNOSIS — Z5189 Encounter for other specified aftercare: Secondary | ICD-10-CM | POA: Insufficient documentation

## 2011-11-23 DIAGNOSIS — I509 Heart failure, unspecified: Secondary | ICD-10-CM | POA: Insufficient documentation

## 2011-11-23 DIAGNOSIS — I2109 ST elevation (STEMI) myocardial infarction involving other coronary artery of anterior wall: Secondary | ICD-10-CM | POA: Insufficient documentation

## 2011-11-23 DIAGNOSIS — I10 Essential (primary) hypertension: Secondary | ICD-10-CM | POA: Insufficient documentation

## 2011-11-25 ENCOUNTER — Encounter (HOSPITAL_COMMUNITY)
Admission: RE | Admit: 2011-11-25 | Discharge: 2011-11-25 | Disposition: A | Discharge status: Home / Self Care | Payer: Medicare Other | Source: Ambulatory Visit | Attending: Cardiology | Admitting: Cardiology

## 2011-11-27 ENCOUNTER — Encounter (HOSPITAL_COMMUNITY)
Admission: RE | Admit: 2011-11-27 | Discharge: 2011-11-27 | Disposition: A | Discharge status: Home / Self Care | Payer: Medicare Other | Source: Ambulatory Visit | Attending: Cardiology | Admitting: Cardiology

## 2011-11-30 ENCOUNTER — Encounter (HOSPITAL_COMMUNITY): Payer: Medicare Other

## 2011-12-01 ENCOUNTER — Encounter: Payer: Self-pay | Admitting: Cardiology

## 2011-12-01 ENCOUNTER — Ambulatory Visit (INDEPENDENT_AMBULATORY_CARE_PROVIDER_SITE_OTHER): Payer: Medicare Other | Admitting: Cardiology

## 2011-12-01 VITALS — BP 100/69 | HR 66 | Ht 70.0 in | Wt 201.4 lb

## 2011-12-01 DIAGNOSIS — I509 Heart failure, unspecified: Secondary | ICD-10-CM

## 2011-12-01 DIAGNOSIS — E785 Hyperlipidemia, unspecified: Secondary | ICD-10-CM

## 2011-12-01 DIAGNOSIS — I5042 Chronic combined systolic (congestive) and diastolic (congestive) heart failure: Secondary | ICD-10-CM

## 2011-12-01 DIAGNOSIS — J189 Pneumonia, unspecified organism: Secondary | ICD-10-CM

## 2011-12-01 DIAGNOSIS — I252 Old myocardial infarction: Secondary | ICD-10-CM

## 2011-12-01 DIAGNOSIS — I2510 Atherosclerotic heart disease of native coronary artery without angina pectoris: Secondary | ICD-10-CM

## 2011-12-01 MED ORDER — FUROSEMIDE 20 MG PO TABS: 20.0000 mg | ORAL_TABLET | ORAL | Status: DC | PRN

## 2011-12-01 NOTE — Patient Instructions (Addendum)
Your physician recommends that you schedule a follow-up appointment in: 6 MONTHS  Your physician has requested that you have an echocardiogram. Echocardiography is a painless test that uses sound waves to create images of your heart. It provides your doctor with information about the size and shape of your heart and how well your heart's chambers and valves are working. This procedure takes approximately one hour. There are no restrictions for this procedure.  Your physician has recommended you make the following change in your medication:  1 - Change Lasix to 20 mg as needed if morning weight increases by 2 pounds

## 2011-12-01 NOTE — Assessment & Plan Note (Signed)
I am hopeful that he says significant LV recovery. I've arranged for echocardiogram as an outpatient. We'll call him with results. Continue low-dose lisinopril and carvedilol. I've made the Lasix when necessary for weight gain of 2 pounds or more. This may help his blood pressure and making him feel better. I will see him back in 6 months.

## 2011-12-01 NOTE — Assessment & Plan Note (Signed)
At goal on rosuvastatin and diet. Patient has lost 11 pounds. He is extremely motivated.

## 2011-12-01 NOTE — Assessment & Plan Note (Signed)
Stable. Continue secondary preventative therapy. 

## 2011-12-01 NOTE — Progress Notes (Signed)
HPI Austin Bright returns today for close followup of his history of coronary artery disease, anterior [place] Miyoshi infarct in August with PTCA and DES stent placement of LAD, congestive heart failure, now resolved.  He is having no angina or chest discomfort. He is in cardiac rehabilitation and really enjoying it. His blood pressure sometimes drops a little bit with exercise but is asymptomatic. His blood pressures averaging about 100 systolic. Heart rates in the 60s. He is doing extremely careful sodium taking in less than 2 g a day. He weighs himself daily. He is a model cardiac patient.  He is on low-dose Lasix 20 mg a day. He is on low-dose carvedilol and lisinopril.  His lipids were checked by primary care in September. Total cholesterol is 141, triglycerides 121, HDL 42 LDL 75 on rosuvastatin.  Past Medical History  Diagnosis Date  . Gout   . Hypertension   . CAD (coronary artery disease)     a. Diagnosed 08/2011 with anterior STEMI due to thrombotic occlusion of mid LAD s/p thrombectomy, PTCA, DES placement 08/23/11.  . Ischemic cardiomyopathy     a. Initial EF 35% by cath 08/23/11, improved to 40-45% by echo 08/25/11.  . Bilateral pneumonia     Diagnosed after STEMI 08/2011  . Dyslipidemia   . Acute MI   . Shortness of breath   . CHF (congestive heart failure)   . Arthritis     Current Outpatient Prescriptions  Medication Sig Dispense Refill  . allopurinol (ZYLOPRIM) 300 MG tablet Take 300 mg by mouth daily.      Allen Kell Lipoic Acid 200 MG CAPS Take 1 capsule by mouth daily.      Marland Kitchen aspirin EC 81 MG tablet Take 81 mg by mouth daily.      . carvedilol (COREG) 3.125 MG tablet Take 3.125 mg by mouth 2 (two) times daily with a meal.      . CINNAMON PO Take 1 capsule by mouth daily.      . Coenzyme Q10 (CO Q-10) 100 MG CAPS Take 1 capsule by mouth daily.      . furosemide (LASIX) 20 MG tablet Take 1 tablet (20 mg total) by mouth daily.  30 tablet  6  . Green Tea 150 MG CAPS Take 1 capsule by  mouth daily.      Marland Kitchen lisinopril (PRINIVIL,ZESTRIL) 10 MG tablet Take 10 mg by mouth daily.      . nitroGLYCERIN (NITROSTAT) 0.4 MG SL tablet Place 0.4 mg under the tongue every 5 (five) minutes as needed. For chest pain.      . prasugrel (EFFIENT) 10 MG TABS Take 10 mg by mouth daily.      Marland Kitchen RESVERATROL 100 MG CAPS Take 1 capsule by mouth daily.      . rosuvastatin (CRESTOR) 40 MG tablet Take 40 mg by mouth daily.      . Turmeric 500 MG CAPS Take 1 capsule by mouth daily.      . vitamin C (ASCORBIC ACID) 500 MG tablet Take 500 mg by mouth daily.        No Known Allergies  No family history on file.  History   Social History  . Marital Status: Divorced    Spouse Name: N/A    Number of Children: N/A  . 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 Smoking status: Former Smoker    Quit date: 08/23/1979  .  Smokeless tobacco: Former Neurosurgeon  . Alcohol Use: 2.4 oz/week    2 Cans of beer, 2 Shots of liquor per week     Comment: occasional  . Drug Use: No  . Sexually Active: Yes   Other Topics Concern  . Not on file   Social History Narrative   Pt lives in [place], is out of work till [employer] 2013 after MI. He is an only child.    ROS ALL NEGATIVE EXCEPT THOSE NOTED IN HPI  PE  General Appearance: well developed, well nourished in no acute distress HEENT: symmetrical face, PERRLA, good dentition  Neck: no JVD, thyromegaly, or adenopathy, trachea midline Chest: symmetric without deformity Cardiac: PMI non-displaced, RRR, normal S1, S2, no gallop or murmur Lung: clear to ausculation and percussion Vascular: all pulses full without bruits  Abdominal: nondistended, nontender, good bowel sounds, no HSM, no bruits Extremities: no cyanosis, clubbing or edema, no sign of DVT, no varicosities  Skin: normal color, no rashes Neuro: alert and oriented x 3, non-focal Pysch: normal affect  EKG  BMET    Component Value  Date/Time   NA 141 09/16/2011 0800   K 4.0 09/16/2011 0800   CL 105 09/16/2011 0800   CO2 22 09/16/2011 0800   GLUCOSE 152* 09/16/2011 0800   BUN 17 09/16/2011 0800   CREATININE 1.09 09/16/2011 0800   CALCIUM 8.9 09/16/2011 0800   GFRNONAA 70* 09/16/2011 0800   GFRAA 81* 09/16/2011 0800    Lipid Panel     Component Value Date/Time   CHOL 169 08/24/2011 0529   TRIG 126 08/24/2011 0529   HDL 44 08/24/2011 0529   CHOLHDL 3.8 08/24/2011 0529   VLDL 25 08/24/2011 0529   LDLCALC 100* 08/24/2011 0529    CBC    Component Value Date/Time   WBC 6.0 09/16/2011 0800   RBC 4.43 09/16/2011 0800   HGB 14.1 09/16/2011 0800   HCT 40.2 09/16/2011 0800   PLT 135* 09/16/2011 0800   MCV 90.7 09/16/2011 0800   MCH 31.8 09/16/2011 0800   MCHC 35.1 09/16/2011 0800   RDW 13.0 09/16/2011 0800   LYMPHSABS 0.8 08/26/2011 0836   MONOABS 0.9 08/26/2011 0836   EOSABS 0.0 08/26/2011 0836   BASOSABS 0.0 08/26/2011 0836

## 2011-12-02 ENCOUNTER — Encounter (HOSPITAL_COMMUNITY): Payer: Medicare Other

## 2011-12-04 ENCOUNTER — Ambulatory Visit (HOSPITAL_COMMUNITY)
Admission: RE | Admit: 2011-12-04 | Discharge: 2011-12-04 | Disposition: A | Discharge status: Home / Self Care | Payer: Medicare Other | Source: Ambulatory Visit | Attending: Cardiology | Admitting: Cardiology

## 2011-12-04 ENCOUNTER — Encounter (HOSPITAL_COMMUNITY)
Admission: RE | Admit: 2011-12-04 | Discharge: 2011-12-04 | Disposition: A | Discharge status: Home / Self Care | Payer: Medicare Other | Source: Ambulatory Visit | Attending: Cardiology | Admitting: Cardiology

## 2011-12-04 DIAGNOSIS — I517 Cardiomegaly: Secondary | ICD-10-CM | POA: Insufficient documentation

## 2011-12-04 DIAGNOSIS — I2510 Atherosclerotic heart disease of native coronary artery without angina pectoris: Secondary | ICD-10-CM | POA: Insufficient documentation

## 2011-12-04 DIAGNOSIS — J189 Pneumonia, unspecified organism: Secondary | ICD-10-CM

## 2011-12-04 DIAGNOSIS — E785 Hyperlipidemia, unspecified: Secondary | ICD-10-CM | POA: Insufficient documentation

## 2011-12-04 DIAGNOSIS — I509 Heart failure, unspecified: Secondary | ICD-10-CM | POA: Insufficient documentation

## 2011-12-04 DIAGNOSIS — I252 Old myocardial infarction: Secondary | ICD-10-CM | POA: Insufficient documentation

## 2011-12-04 NOTE — Progress Notes (Signed)
  Echocardiogram 2D Echocardiogram has been performed.  Austin Bright M 12/04/2011, 9:41 AM

## 2011-12-07 ENCOUNTER — Encounter (HOSPITAL_COMMUNITY): Payer: Medicare Other

## 2011-12-09 ENCOUNTER — Encounter (HOSPITAL_COMMUNITY)
Admission: RE | Admit: 2011-12-09 | Discharge: 2011-12-09 | Disposition: A | Discharge status: Home / Self Care | Payer: Medicare Other | Source: Ambulatory Visit | Attending: Cardiology | Admitting: Cardiology

## 2011-12-11 ENCOUNTER — Encounter (HOSPITAL_COMMUNITY)
Admission: RE | Admit: 2011-12-11 | Discharge: 2011-12-11 | Disposition: A | Discharge status: Home / Self Care | Payer: Medicare Other | Source: Ambulatory Visit | Attending: Cardiology | Admitting: Cardiology

## 2011-12-14 ENCOUNTER — Encounter (HOSPITAL_COMMUNITY)
Admission: RE | Admit: 2011-12-14 | Discharge: 2011-12-14 | Disposition: A | Discharge status: Home / Self Care | Payer: Medicare Other | Source: Ambulatory Visit | Attending: Cardiology | Admitting: Cardiology

## 2011-12-16 ENCOUNTER — Encounter: Payer: Self-pay | Admitting: Cardiology

## 2011-12-16 ENCOUNTER — Encounter (HOSPITAL_COMMUNITY)
Admission: RE | Admit: 2011-12-16 | Discharge: 2011-12-16 | Disposition: A | Discharge status: Home / Self Care | Payer: Medicare Other | Source: Ambulatory Visit | Attending: Cardiology | Admitting: Cardiology

## 2011-12-18 ENCOUNTER — Encounter (HOSPITAL_COMMUNITY): Payer: Medicare Other

## 2011-12-21 ENCOUNTER — Encounter (HOSPITAL_COMMUNITY): Payer: Medicare Other

## 2011-12-23 ENCOUNTER — Encounter (HOSPITAL_COMMUNITY): Payer: Medicare Other

## 2011-12-25 ENCOUNTER — Encounter (HOSPITAL_COMMUNITY)
Admission: RE | Admit: 2011-12-25 | Discharge: 2011-12-25 | Disposition: A | Discharge status: Home / Self Care | Payer: Medicare Other | Source: Ambulatory Visit | Attending: Cardiology | Admitting: Cardiology

## 2011-12-25 DIAGNOSIS — I509 Heart failure, unspecified: Secondary | ICD-10-CM | POA: Insufficient documentation

## 2011-12-25 DIAGNOSIS — I2109 ST elevation (STEMI) myocardial infarction involving other coronary artery of anterior wall: Secondary | ICD-10-CM | POA: Insufficient documentation

## 2011-12-25 DIAGNOSIS — Z5189 Encounter for other specified aftercare: Secondary | ICD-10-CM | POA: Insufficient documentation

## 2011-12-25 DIAGNOSIS — I10 Essential (primary) hypertension: Secondary | ICD-10-CM | POA: Insufficient documentation

## 2011-12-25 DIAGNOSIS — I2510 Atherosclerotic heart disease of native coronary artery without angina pectoris: Secondary | ICD-10-CM | POA: Insufficient documentation

## 2011-12-25 NOTE — Telephone Encounter (Signed)
Dr. Daleen Squibb said taking the Viagra would be fine as long as you have not been taking any nitroglycerin the same day. Mylo Red RN

## 2011-12-28 ENCOUNTER — Encounter (HOSPITAL_COMMUNITY)
Admission: RE | Admit: 2011-12-28 | Discharge: 2011-12-28 | Disposition: A | Discharge status: Home / Self Care | Payer: Medicare Other | Source: Ambulatory Visit | Attending: Cardiology | Admitting: Cardiology

## 2011-12-30 ENCOUNTER — Encounter (HOSPITAL_COMMUNITY)
Admission: RE | Admit: 2011-12-30 | Discharge: 2011-12-30 | Disposition: A | Discharge status: Home / Self Care | Payer: Medicare Other | Source: Ambulatory Visit | Attending: Cardiology | Admitting: Cardiology

## 2012-01-01 ENCOUNTER — Encounter (HOSPITAL_COMMUNITY): Payer: Medicare Other

## 2012-01-04 ENCOUNTER — Encounter (HOSPITAL_COMMUNITY)
Admission: RE | Admit: 2012-01-04 | Discharge: 2012-01-04 | Disposition: A | Discharge status: Home / Self Care | Payer: Medicare Other | Source: Ambulatory Visit | Attending: Cardiology | Admitting: Cardiology

## 2012-01-06 ENCOUNTER — Encounter (HOSPITAL_COMMUNITY): Payer: Medicare Other

## 2012-01-08 ENCOUNTER — Encounter (HOSPITAL_COMMUNITY)
Admission: RE | Admit: 2012-01-08 | Discharge: 2012-01-08 | Disposition: A | Discharge status: Home / Self Care | Payer: Medicare Other | Source: Ambulatory Visit | Attending: Cardiology | Admitting: Cardiology

## 2012-01-11 ENCOUNTER — Encounter (HOSPITAL_COMMUNITY)
Admission: RE | Admit: 2012-01-11 | Discharge: 2012-01-11 | Disposition: A | Discharge status: Home / Self Care | Payer: Medicare Other | Source: Ambulatory Visit | Attending: Cardiology | Admitting: Cardiology

## 2012-01-15 ENCOUNTER — Encounter (HOSPITAL_COMMUNITY)
Admission: RE | Admit: 2012-01-15 | Discharge: 2012-01-15 | Disposition: A | Discharge status: Home / Self Care | Payer: Medicare Other | Source: Ambulatory Visit | Attending: Cardiology | Admitting: Cardiology

## 2012-01-18 ENCOUNTER — Encounter (HOSPITAL_COMMUNITY): Payer: Medicare Other

## 2012-01-22 ENCOUNTER — Encounter (HOSPITAL_COMMUNITY)
Admission: RE | Admit: 2012-01-22 | Discharge: 2012-01-22 | Disposition: A | Discharge status: Home / Self Care | Payer: Medicare HMO | Source: Ambulatory Visit | Attending: Cardiology | Admitting: Cardiology

## 2012-01-22 DIAGNOSIS — I2510 Atherosclerotic heart disease of native coronary artery without angina pectoris: Secondary | ICD-10-CM | POA: Insufficient documentation

## 2012-01-22 DIAGNOSIS — I2109 ST elevation (STEMI) myocardial infarction involving other coronary artery of anterior wall: Secondary | ICD-10-CM | POA: Insufficient documentation

## 2012-01-22 DIAGNOSIS — I509 Heart failure, unspecified: Secondary | ICD-10-CM | POA: Insufficient documentation

## 2012-01-22 DIAGNOSIS — Z5189 Encounter for other specified aftercare: Secondary | ICD-10-CM | POA: Insufficient documentation

## 2012-01-22 DIAGNOSIS — I10 Essential (primary) hypertension: Secondary | ICD-10-CM | POA: Insufficient documentation

## 2012-01-25 ENCOUNTER — Encounter (HOSPITAL_COMMUNITY)
Admission: RE | Admit: 2012-01-25 | Discharge: 2012-01-25 | Disposition: A | Discharge status: Home / Self Care | Payer: Medicare HMO | Source: Ambulatory Visit | Attending: Cardiology | Admitting: Cardiology

## 2012-01-27 ENCOUNTER — Encounter (HOSPITAL_COMMUNITY)
Admission: RE | Admit: 2012-01-27 | Discharge: 2012-01-27 | Disposition: A | Discharge status: Home / Self Care | Payer: Medicare HMO | Source: Ambulatory Visit | Attending: Cardiology | Admitting: Cardiology

## 2012-01-29 ENCOUNTER — Encounter (HOSPITAL_COMMUNITY)
Admission: RE | Admit: 2012-01-29 | Discharge: 2012-01-29 | Disposition: A | Discharge status: Home / Self Care | Payer: Medicare HMO | Source: Ambulatory Visit | Attending: Cardiology | Admitting: Cardiology

## 2012-02-01 ENCOUNTER — Encounter (HOSPITAL_COMMUNITY)
Admission: RE | Admit: 2012-02-01 | Discharge: 2012-02-01 | Disposition: A | Discharge status: Home / Self Care | Payer: Medicare HMO | Source: Ambulatory Visit | Attending: Cardiology | Admitting: Cardiology

## 2012-02-03 ENCOUNTER — Encounter (HOSPITAL_COMMUNITY)
Admission: RE | Admit: 2012-02-03 | Discharge: 2012-02-03 | Disposition: A | Discharge status: Home / Self Care | Payer: Medicare HMO | Source: Ambulatory Visit | Attending: Cardiology | Admitting: Cardiology

## 2012-02-05 ENCOUNTER — Encounter (HOSPITAL_COMMUNITY): Payer: Medicare HMO

## 2012-02-08 ENCOUNTER — Encounter (HOSPITAL_COMMUNITY)
Admission: RE | Admit: 2012-02-08 | Discharge: 2012-02-08 | Disposition: A | Discharge status: Home / Self Care | Payer: Medicare HMO | Source: Ambulatory Visit | Attending: Cardiology | Admitting: Cardiology

## 2012-02-08 VITALS — BP 112/82 | HR 98 | Ht 70.0 in | Wt 209.7 lb

## 2012-02-08 DIAGNOSIS — I219 Acute myocardial infarction, unspecified: Secondary | ICD-10-CM

## 2012-02-08 DIAGNOSIS — Z9861 Coronary angioplasty status: Secondary | ICD-10-CM

## 2012-02-08 NOTE — Patient Instructions (Signed)
Pt has finished orientation and is scheduled to start CR on 09/25/11 at 6:45. Pt has been instructed to arrive to class 15 minutes early for scheduled class. Pt has been instructed to wear comfortable clothing and shoes with rubber soles. Pt has been told to take their medications 1 hour prior to coming to class.  If the patient is not going to attend class, he/she has been instructed to call. Patient completed 6 minute pre walk test.   Patient graduated from CR on 02/08/12 at 8 am. Pt was given all exit materials, current medication were reviewed. Patient expressed an understanding of home exercise prescription. He plans to continue at the [facility]. Patient stated that if he is not satisfied with the [facility] he will come back to our maintenance program.

## 2012-02-08 NOTE — Progress Notes (Signed)
Patient graduated from Cardiac Rehabilitation today on 02/08/12 after completing 36 sessions. He achieved LTG of 30 minutes of aerobic exercise at Max Met level of 4.2. All patients vitals are WNL. Patient has met with dietician. Discharge instruction has been reviewed in detail and patient stated an understanding of material given. Patient plans to exercise at home on his Treadmill. Cardiac Rehab staff will make f/u calls at 1 month, 6 months, and 1 year. Patient had no complaints of any abnormal S/S or pain on their exit visit. Patient completed 6 min pre/post walk test.

## 2012-02-10 NOTE — Progress Notes (Signed)
Cardiac Rehabilitation Program Outcomes Report   Orientation:  09/24/2011 18th Visit report: 11/27/2011 Graduate Date:  tbd Discharge Date:  tbd # of sessions completed: 18 DX Stent x 1 and MI  Cardiologist: Valera Castle Family MD:  Shaune Pollack Class Time:  06:45  A.  Exercise Program:  Tolerates exercise @ 3.10 METS for 15 minutes  B.  Mental Health:  Good mental attitude  C.  Education/Instruction/Skills  Accurately checks own pulse.  Rest:  77  Exercise: 130, Knows THR for exercise and Uses Perceived Exertion Scale and/or Dyspnea Scale  Uses Perceived Exertion Scale and/or Dyspnea Scale  D.  Nutrition/Weight Control/Body Composition:  Adherence to prescribed nutrition program: good    E.  Blood Lipids    Lab Results  Component Value Date   CHOL 169 08/24/2011   HDL 44 08/24/2011   LDLCALC 161* 08/24/2011   TRIG 126 08/24/2011   CHOLHDL 3.8 08/24/2011    F.  Lifestyle Changes:  Making positive lifestyle changes  G.  Symptoms noted with exercise:  Asymptomatic  Report Completed By:  Austin Bright. Austin Bivens RN   Comments:  This is patients Halfway report. He achieved a peak METS of 3.10. His resting HR was 77 and resting BP 100/70, His Peak HR was 130 and His peak BP was 150/80. A graduation report will follow.

## 2012-02-10 NOTE — Progress Notes (Signed)
Cardiac Rehabilitation Program Outcomes Report   Orientation:  09/24/2011 Graduate Date:  02/08/2012 Discharge Date:  02/08/2012 # of sessions completed: 36 DX: Stent X 1 and MI  Cardiologist: Valera Castle Family MD:  ED Benedetto Goad Time:  06:45  A.  Exercise Program:  Tolerates exercise @ 4.16 METS for 15 minutes, Walk Test Results:  Post: Post 6 minute walk test, Resting HR 68, BP 118/60, O2 98%, RPE 6, and RPD 6. 6 minute HR 114,  BP 130/88, O2 98%, RPE 7, RPD 8, Post HR 77, BP 108/60, O2  98%, RPE 6, and RPD 6. walked 1950 ft at 3.69 mph. and Discharged to home exercise program.  Anticipated compliance:  excellent  Functional capacity 10.53%, Muscular strength 9.38%,and flexibility 10.53%  B.  Mental Health:  Good mental attitude and Quality of Life (QOL)  changes:  Overall  13.17 %, Health/Functioning 19.50 %, Socioeconomics 3.85 %, Psych/Spiritual 18.80 %, Family -2.08 %    C.  Education/Instruction/Skills  Accurately checks own pulse.  Rest:  68  Exercise:  140, Knows THR for exercise, Uses Perceived Exertion Scale and/or Dyspnea Scale and Attended 13 education classes education score of 96%.  Uses Perceived Exertion Scale and/or Dyspnea Scale  D.  Nutrition/Weight Control/Body Composition:  Adherence to prescribed nutrition program: good , % Body Fat  25.8, Patient has lost -3.65 kg, Evidence of fat weight loss and Complimentary/Alt. Therapy cholesterol 169; triglycerides 126, HDL 44, and LDL 100.   E.  Blood Lipids    Lab Results  Component Value Date   CHOL 169 08/24/2011   HDL 44 08/24/2011   LDLCALC 161* 08/24/2011   TRIG 126 08/24/2011   CHOLHDL 3.8 08/24/2011    F.  Lifestyle Changes:  Making positive lifestyle changes and Not smoking:  Quit 01/20/1979  G.  Symptoms noted with exercise:  Asymptomatic  Report Completed By:  Lelon Huh. Raela Bohl RN   Comments:  This is patients Graduation report. He achieved a peak METS of 4.2. His resting HR was  68 and resting BP was 118/60, His peak HR was 140 and peak BP was 140/80. He has done very well in rehab. He made great improvement.

## 2012-02-10 NOTE — Progress Notes (Signed)
Cardiac Rehabilitation Program Outcomes Report   Orientation:  09/24/2011 1st week report: 10/02/2011 Graduate Date:  tbd Discharge Date:  tbd # of sessions completed: 3 DX: Stent X 1 and MI  Cardiologist: Valera Castle Family MD:  Shaune Pollack Class Time:  06:45  A.  Exercise Program:  Tolerates exercise @ 2.53 METS for 15 minutes and Walk Test Results:  Pre: 6 minute walk test Resting HR 67, BP 102/70, O2 98%, RPE 7, and RPD 7, 6 minute HR 91, BP 130/92, O2 98%, RPE 9, and RPD 9. Post HR 62, BP 106/58, O2 100%, RPE 7, and RPD 7. Patient walked 1600 ft at 3.0 mph.  B.  Mental Health:  Good mental attitude  C.  Education/Instruction/Skills  Accurately checks own pulse.  Rest:  73  Exercise: 119, Knows THR for exercise and Uses Perceived Exertion Scale and/or Dyspnea Scale  Uses Perceived Exertion Scale and/or Dyspnea Scale  D.  Nutrition/Weight Control/Body Composition:  Adherence to prescribed nutrition program: good    E.  Blood Lipids    Lab Results  Component Value Date   CHOL 169 08/24/2011   HDL 44 08/24/2011   LDLCALC 161* 08/24/2011   TRIG 126 08/24/2011   CHOLHDL 3.8 08/24/2011    F.  Lifestyle Changes:  Making positive lifestyle changes  G.  Symptoms noted with exercise:  Asymptomatic  Report Completed By:  Lelon Huh. Joselyne Spake RN   Comments:  This is patients 1 st week report. He achieved a peak METS of 2.53. His resting HR was 73 and BP was 102/50, His peak HR was 119 and peak BP was 110/68. A halfway report will follow on his 18 th visit.

## 2012-03-05 ENCOUNTER — Other Ambulatory Visit: Payer: Self-pay

## 2012-05-02 ENCOUNTER — Telehealth: Payer: Self-pay | Admitting: Cardiology

## 2012-05-02 MED ORDER — LISINOPRIL 10 MG PO TABS: 10.0000 mg | ORAL_TABLET | Freq: Every day | ORAL | Status: DC

## 2012-05-02 MED ORDER — ROSUVASTATIN CALCIUM 40 MG PO TABS: 40.0000 mg | ORAL_TABLET | Freq: Every day | ORAL | Status: DC

## 2012-05-02 NOTE — Telephone Encounter (Signed)
Request sent @ this time

## 2012-05-02 NOTE — Telephone Encounter (Signed)
lisinopril (PRINIVIL,ZESTRIL) 10 MG tablet rosuvastatin (CRESTOR) 40 MG tablet  Patient needs refills.

## 2012-05-31 ENCOUNTER — Encounter: Payer: Self-pay | Admitting: Cardiology

## 2012-05-31 ENCOUNTER — Ambulatory Visit (INDEPENDENT_AMBULATORY_CARE_PROVIDER_SITE_OTHER): Payer: Medicare HMO | Admitting: Cardiology

## 2012-05-31 VITALS — BP 134/78 | HR 61 | Ht 70.0 in | Wt 207.4 lb

## 2012-05-31 DIAGNOSIS — I5042 Chronic combined systolic (congestive) and diastolic (congestive) heart failure: Secondary | ICD-10-CM

## 2012-05-31 DIAGNOSIS — J189 Pneumonia, unspecified organism: Secondary | ICD-10-CM

## 2012-05-31 DIAGNOSIS — I2510 Atherosclerotic heart disease of native coronary artery without angina pectoris: Secondary | ICD-10-CM

## 2012-05-31 DIAGNOSIS — I509 Heart failure, unspecified: Secondary | ICD-10-CM

## 2012-05-31 DIAGNOSIS — I252 Old myocardial infarction: Secondary | ICD-10-CM

## 2012-05-31 NOTE — Progress Notes (Signed)
HPI Austin Bright returns today for evaluation and management his history of an anterior Austin Bright myocardial infarction in August of 2013. She felt to be due to thrombotic occlusion. He had DES stent placed. His initial ejection fraction was 35% by cath but returned to 55% by echocardiogram several months later.  He is very active working outdoors. He denies any chest pain consistent with angina. He denies any orthopnea, dyspnea on exertion, edema, palpitations or syncope. He is very compliant with his medications. Blood work is followed by Dr. Juanetta Gosling.  Past Medical History  Diagnosis Date  . Gout   . Hypertension   . CAD (coronary artery disease)     a. Diagnosed 08/2011 with anterior STEMI due to thrombotic occlusion of mid LAD s/p thrombectomy, PTCA, DES placement 08/23/11.  . Ischemic cardiomyopathy     a. Initial EF 35% by cath 08/23/11, improved to 40-45% by echo 08/25/11.  . Bilateral pneumonia     Diagnosed after STEMI 08/2011  . Dyslipidemia   . Acute MI   . Shortness of breath   . CHF (congestive heart failure)   . Arthritis     Current Outpatient Prescriptions  Medication Sig Dispense Refill  . allopurinol (ZYLOPRIM) 300 MG tablet Take 300 mg by mouth daily.      Allen Kell Lipoic Acid 200 MG CAPS Take 1 capsule by mouth daily.      Marland Kitchen aspirin EC 81 MG tablet Take 81 mg by mouth daily.      . carvedilol (COREG) 3.125 MG tablet Take 3.125 mg by mouth 2 (two) times daily with a meal.      . CINNAMON PO Take 1 capsule by mouth daily.      . Coenzyme Q10 (CO Q-10) 100 MG CAPS Take 1 capsule by mouth daily.      . furosemide (LASIX) 20 MG tablet Take 1 tablet (20 mg total) by mouth as needed (Take if weight increases by 2 pounds).  30 tablet  6  . Green Tea 150 MG CAPS Take 1 capsule by mouth daily.      Marland Kitchen lisinopril (PRINIVIL,ZESTRIL) 10 MG tablet Take 1 tablet (10 mg total) by mouth daily.  30 tablet  6  . nitroGLYCERIN (NITROSTAT) 0.4 MG SL tablet Place 0.4 mg under the tongue every 5 (five)  minutes as needed. For chest pain.      . prasugrel (EFFIENT) 10 MG TABS Take 10 mg by mouth daily.      Marland Kitchen RESVERATROL 100 MG CAPS Take 1 capsule by mouth daily.      . rosuvastatin (CRESTOR) 40 MG tablet Take 1 tablet (40 mg total) by mouth daily.  30 tablet  6  . Turmeric 500 MG CAPS Take 1 capsule by mouth daily.      . vitamin C (ASCORBIC ACID) 500 MG tablet Take 500 mg by mouth daily.       No current facility-administered medications for this visit.    No Known Allergies  History reviewed. No pertinent family history.  History   Social History  . Marital Status: Married    Spouse Name: N/A    Number of Children: N/A  . 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 Smoking status: Former Smoker    Quit date: 08/23/1979  . Smokeless tobacco: Former Neurosurgeon  . Alcohol Use: 2.4 oz/week    2 Cans of beer, 2 Shots of liquor per  week     Comment: occasional  . Drug Use: No  . Sexually Active: Yes   Other Topics Concern  . Not on file   Social History Narrative   Pt lives in [place], is out of work till [employer] 2013 after MI. He is an only child.    ROS ALL NEGATIVE EXCEPT THOSE NOTED IN HPI  PE  General Appearance: well developed, well nourished in no acute distress HEENT: symmetrical face, PERRLA, good dentition  Neck: no JVD, thyromegaly, or adenopathy, trachea midline Chest: symmetric without deformity Cardiac: PMI non-displaced, RRR, normal S1, S2, no gallop or murmur Lung: clear to ausculation and percussion Vascular: all pulses full without bruits  Abdominal: nondistended, nontender, good bowel sounds, no HSM, no bruits Extremities: no cyanosis, clubbing or edema, no sign of DVT, no varicosities  Skin: normal color, no rashes Neuro: alert and oriented x 3, non-focal Pysch: normal affect  EKG Normal sinus rhythm, left anterior fascicular block, old septal Aedin Jeansonne infarct. BMET    Component Value  Date/Time   NA 141 09/16/2011 0800   K 4.0 09/16/2011 0800   CL 105 09/16/2011 0800   CO2 22 09/16/2011 0800   GLUCOSE 152* 09/16/2011 0800   BUN 17 09/16/2011 0800   CREATININE 1.09 09/16/2011 0800   CALCIUM 8.9 09/16/2011 0800   GFRNONAA 70* 09/16/2011 0800   GFRAA 81* 09/16/2011 0800    Lipid Panel     Component Value Date/Time   CHOL 169 08/24/2011 0529   TRIG 126 08/24/2011 0529   HDL 44 08/24/2011 0529   CHOLHDL 3.8 08/24/2011 0529   VLDL 25 08/24/2011 0529   LDLCALC 100* 08/24/2011 0529    CBC    Component Value Date/Time   WBC 6.0 09/16/2011 0800   RBC 4.43 09/16/2011 0800   HGB 14.1 09/16/2011 0800   HCT 40.2 09/16/2011 0800   PLT 135* 09/16/2011 0800   MCV 90.7 09/16/2011 0800   MCH 31.8 09/16/2011 0800   MCHC 35.1 09/16/2011 0800   RDW 13.0 09/16/2011 0800   LYMPHSABS 0.8 08/26/2011 0836   MONOABS 0.9 08/26/2011 0836   EOSABS 0.0 08/26/2011 0836   BASOSABS 0.0 08/26/2011 0836

## 2012-05-31 NOTE — Patient Instructions (Addendum)
Your physician wants you to follow-up in: 1 year. You will receive a reminder letter in the mail two months in advance. If you don't receive a letter, please call our office to schedule the follow-up appointment.  

## 2012-05-31 NOTE — Assessment & Plan Note (Signed)
Stable. Continue secondary preventative therapy. Return the office in one year. Patient advised to get his blood work checked particularly once a year. He'll follow Dr. Juanetta Gosling.

## 2012-08-23 ENCOUNTER — Other Ambulatory Visit: Payer: Self-pay | Admitting: *Deleted

## 2012-08-23 MED ORDER — NITROGLYCERIN 0.4 MG SL SUBL: 0.4000 mg | SUBLINGUAL_TABLET | SUBLINGUAL | Status: DC | PRN

## 2012-08-23 NOTE — Telephone Encounter (Signed)
Per Lauren ok to fill.

## 2012-08-24 ENCOUNTER — Other Ambulatory Visit: Payer: Self-pay

## 2012-09-14 ENCOUNTER — Emergency Department (HOSPITAL_COMMUNITY)
Admission: EM | Admit: 2012-09-14 | Discharge: 2012-09-14 | Disposition: A | Discharge status: Home / Self Care | Payer: Medicare HMO | Attending: Emergency Medicine | Admitting: Emergency Medicine

## 2012-09-14 ENCOUNTER — Encounter (HOSPITAL_COMMUNITY): Payer: Self-pay | Admitting: Emergency Medicine

## 2012-09-14 DIAGNOSIS — I2510 Atherosclerotic heart disease of native coronary artery without angina pectoris: Secondary | ICD-10-CM | POA: Insufficient documentation

## 2012-09-14 DIAGNOSIS — Z8701 Personal history of pneumonia (recurrent): Secondary | ICD-10-CM | POA: Insufficient documentation

## 2012-09-14 DIAGNOSIS — Z8709 Personal history of other diseases of the respiratory system: Secondary | ICD-10-CM | POA: Insufficient documentation

## 2012-09-14 DIAGNOSIS — Z87891 Personal history of nicotine dependence: Secondary | ICD-10-CM | POA: Insufficient documentation

## 2012-09-14 DIAGNOSIS — Z7902 Long term (current) use of antithrombotics/antiplatelets: Secondary | ICD-10-CM | POA: Insufficient documentation

## 2012-09-14 DIAGNOSIS — Z7982 Long term (current) use of aspirin: Secondary | ICD-10-CM | POA: Insufficient documentation

## 2012-09-14 DIAGNOSIS — M109 Gout, unspecified: Secondary | ICD-10-CM | POA: Insufficient documentation

## 2012-09-14 DIAGNOSIS — Z8679 Personal history of other diseases of the circulatory system: Secondary | ICD-10-CM | POA: Insufficient documentation

## 2012-09-14 DIAGNOSIS — Z79899 Other long term (current) drug therapy: Secondary | ICD-10-CM | POA: Insufficient documentation

## 2012-09-14 DIAGNOSIS — I10 Essential (primary) hypertension: Secondary | ICD-10-CM | POA: Insufficient documentation

## 2012-09-14 DIAGNOSIS — Z9861 Coronary angioplasty status: Secondary | ICD-10-CM | POA: Insufficient documentation

## 2012-09-14 DIAGNOSIS — E785 Hyperlipidemia, unspecified: Secondary | ICD-10-CM | POA: Insufficient documentation

## 2012-09-14 DIAGNOSIS — R040 Epistaxis: Secondary | ICD-10-CM | POA: Insufficient documentation

## 2012-09-14 DIAGNOSIS — I509 Heart failure, unspecified: Secondary | ICD-10-CM | POA: Insufficient documentation

## 2012-09-14 DIAGNOSIS — I252 Old myocardial infarction: Secondary | ICD-10-CM | POA: Insufficient documentation

## 2012-09-14 MED ORDER — PANTOPRAZOLE SODIUM 40 MG IV SOLR
40.0000 mg | Freq: Once | INTRAVENOUS | Status: AC
  Administered 2012-09-14: 40 mg via INTRAVENOUS
  Filled 2012-09-14: qty 40

## 2012-09-14 MED ORDER — SODIUM CHLORIDE 0.9 % IV BOLUS (SEPSIS)
1000.0000 mL | Freq: Once | INTRAVENOUS | Status: AC
  Administered 2012-09-14: 1000 mL via INTRAVENOUS

## 2012-09-14 MED ORDER — ONDANSETRON HCL 4 MG/2ML IJ SOLN
4.0000 mg | Freq: Once | INTRAMUSCULAR | Status: AC
  Administered 2012-09-14: 4 mg via INTRAVENOUS
  Filled 2012-09-14: qty 2

## 2012-09-14 NOTE — ED Notes (Signed)
Pt reports nose bleed which began 1 hour ago. Currently actively bleeding. Pt reports taking Effient currently. No injury noted.

## 2012-09-14 NOTE — ED Notes (Signed)
Bleeding has decreased since packing was placed to left nare by Dr. Adriana Simas. nad noted. Resting in bed.

## 2012-09-14 NOTE — ED Provider Notes (Signed)
CSN: [id number]     Arrival date & time 09/14/12  0807 History  This chart was scribed for Donnetta Hutching, MD by Blanchard Kelch, ED Scribe. The patient was seen in room APA11/APA11. Patient's care was started at 8:19 AM.    Chief Complaint  Patient presents with  . Epistaxis   (Consider location/radiation/quality/duration/timing/severity/associated sxs/prior Treatment)  The history is provided by the patient. No language interpreter was used.   Level 5 caveat due to urgent need for intervention.  HPI Comments: Austin Bright is a 67 y.o. male who presents to the Emergency Department complaining of constant epistaxis in left nostril that began an hour ago at home.  Patient denies being prone to nosebleeds but his wife reports he gets small capillary bleeds related to weather changes. Patient tried "silver nitrate sticks"  at home with no relief. He is currently actively bleeding copiously.  Patient denies associated nausea. He has a past surgical history of coronary stent placement and takes Effient   Past Medical History  Diagnosis Date  . Gout   . Hypertension   . CAD (coronary artery disease)     a. Diagnosed 08/2011 with anterior STEMI due to thrombotic occlusion of mid LAD s/p thrombectomy, PTCA, DES placement 08/23/11.  . Ischemic cardiomyopathy     a. Initial EF 35% by cath 08/23/11, improved to 40-45% by echo 08/25/11.  . Bilateral pneumonia     Diagnosed after STEMI 08/2011  . Dyslipidemia   . Acute MI   . Shortness of breath   . CHF (congestive heart failure)   . Arthritis    Past Surgical History  Procedure Laterality Date  . No past surgeries    . Coronary stent placement    . Cystectomy  1969  . Carpel tunnel  2002   No family history on file. History  Substance Use Topics  . Smoking status: Former Smoker    Quit date: 08/23/1979  . Smokeless tobacco: Former Neurosurgeon  . Alcohol Use: 2.4 oz/week    2 Cans of beer, 2 Shots of liquor per week     Comment: occasional     Review of Systems  Unable to perform ROS: Acuity of condition     Allergies  Review of patient's allergies indicates no known allergies.  Home Medications   Current Outpatient Rx  Name  Route  Sig  Dispense  Refill  . allopurinol (ZYLOPRIM) 300 MG tablet   Oral   Take 300 mg by mouth daily.         Allen Kell Lipoic Acid 200 MG CAPS   Oral   Take 1 capsule by mouth daily.         Marland Kitchen aspirin EC 81 MG tablet   Oral   Take 81 mg by mouth daily.         . carvedilol (COREG) 3.125 MG tablet   Oral   Take 3.125 mg by mouth 2 (two) times daily with a meal.         . CINNAMON PO   Oral   Take 1 capsule by mouth daily.         . Coenzyme Q10 (CO Q-10) 100 MG CAPS   Oral   Take 1 capsule by mouth daily.         . furosemide (LASIX) 20 MG tablet   Oral   Take 1 tablet (20 mg total) by mouth as needed (Take if weight increases by 2 pounds).   30 tablet  6   . Green Tea 150 MG CAPS   Oral   Take 1 capsule by mouth daily.         Marland Kitchen lisinopril (PRINIVIL,ZESTRIL) 10 MG tablet   Oral   Take 1 tablet (10 mg total) by mouth daily.   30 tablet   6   . nitroGLYCERIN (NITROSTAT) 0.4 MG SL tablet   Sublingual   Place 1 tablet (0.4 mg total) under the tongue every 5 (five) minutes as needed. For chest pain.   25 tablet   3   . prasugrel (EFFIENT) 10 MG TABS   Oral   Take 10 mg by mouth daily.         Marland Kitchen RESVERATROL 100 MG CAPS   Oral   Take 1 capsule by mouth daily.         . rosuvastatin (CRESTOR) 40 MG tablet   Oral   Take 1 tablet (40 mg total) by mouth daily.   30 tablet   6   . Turmeric 500 MG CAPS   Oral   Take 1 capsule by mouth daily.         . vitamin C (ASCORBIC ACID) 500 MG tablet   Oral   Take 500 mg by mouth daily.          Triage Vitals: BP 142/91  Pulse 70  Temp(Src) 97.4 F (36.3 C) (Oral)  Resp 18  Ht 5\' 10"  (1.778 m)  Wt 205 lb (92.987 kg)  BMI 29.41 kg/m2  SpO2 98%  Physical Exam  Nursing note and vitals  reviewed. Constitutional: He is oriented to person, place, and time. He appears well-developed and well-nourished.  HENT:  Head: Normocephalic and atraumatic.  Nose: Epistaxis (left nostril) is observed.  Eyes: Conjunctivae and EOM are normal. Pupils are equal, round, and reactive to light.  Neck: Normal range of motion. Neck supple.  Cardiovascular: Normal rate, regular rhythm and normal heart sounds.   Pulmonary/Chest: Effort normal and breath sounds normal.  Abdominal: Soft. Bowel sounds are normal.  Musculoskeletal: Normal range of motion.  Neurological: He is alert and oriented to person, place, and time.  Skin: Skin is warm and dry.  Psychiatric: He has a normal mood and affect.    ED Course  EPISTAXIS MANAGEMENT Date/Time: 09/15/2012 8:30 AM Performed by: Donnetta Hutching Authorized by: Donnetta Hutching Consent: Verbal consent obtained. Risks and benefits: risks, benefits and alternatives were discussed Consent given by: patient Patient understanding: patient states understanding of the procedure being performed Patient consent: the patient's understanding of the procedure matches consent given Patient identity confirmed: verbally with patient Patient sedated: no Treatment site: left anterior Post-procedure assessment: bleeding stopped Treatment complexity: simple Patient tolerance: Patient tolerated the procedure well with no immediate complications. Comments: Meercel packing placed c good hemostasis   (including critical care time)  8:27 AM- Performed nasal packing. Bleeding is currently under control.  DIAGNOSTIC STUDIES: Oxygen Saturation is 98% on room air, normal by my interpretation.    COORDINATION OF CARE:  8:30 AM - Will order IV fluids and Zofran. Recommended missing work. Patient verbalizes understanding and agrees with treatment plan.    Labs Review Labs Reviewed - No data to display Imaging Review No results found.  MDM  No diagnosis found. Copious  bleeding from left nares.   Good hemostasis after Mercel packing applied.  Patient observed in emergency department for approximately 2 hours. IV fluids, IV Protonix, IV Zofran given for gastritis.   Referral to ENT for packing  removal.   I personally performed the services described in this documentation, which was scribed in my presence. The recorded information has been reviewed and is accurate.    Donnetta Hutching, MD 09/16/12 [id number]

## 2012-09-14 NOTE — ED Notes (Signed)
Pt sitting up in chair with nad noted. Nose bleed controlled at this time.

## 2012-11-24 ENCOUNTER — Other Ambulatory Visit: Payer: Self-pay

## 2012-12-09 ENCOUNTER — Telehealth: Payer: Self-pay | Admitting: *Deleted

## 2012-12-09 MED ORDER — ROSUVASTATIN CALCIUM 40 MG PO TABS: 40.0000 mg | ORAL_TABLET | Freq: Every day | ORAL | Status: DC

## 2012-12-09 NOTE — Telephone Encounter (Signed)
I called & spoke with pt about his Crestor. There had been some difficulty with faxed requests sent to our office about filling his Crestor. I have called [facility] & ordered pt refill.  Pt states that after this he may have his pcp fill his Crestor as he has had so much difficulty this tiime.  His pcp also checks monitors his lipids  Mylo Red RN

## 2012-12-09 NOTE — Telephone Encounter (Signed)
CRESTOR 40 MG [facility]. PT HAS BEEN OUT FOR A WEEK/TMJ

## 2013-01-02 ENCOUNTER — Other Ambulatory Visit: Payer: Self-pay | Admitting: Cardiology

## 2013-01-24 ENCOUNTER — Other Ambulatory Visit: Payer: Self-pay | Admitting: Cardiology

## 2013-05-24 ENCOUNTER — Other Ambulatory Visit: Payer: Self-pay | Admitting: Cardiology

## 2013-06-05 ENCOUNTER — Encounter: Payer: Self-pay | Admitting: Cardiology

## 2013-06-05 ENCOUNTER — Ambulatory Visit (INDEPENDENT_AMBULATORY_CARE_PROVIDER_SITE_OTHER): Payer: Medicare HMO | Admitting: Cardiology

## 2013-06-05 VITALS — BP 122/76 | HR 67 | Ht 70.0 in | Wt 215.0 lb

## 2013-06-05 DIAGNOSIS — E785 Hyperlipidemia, unspecified: Secondary | ICD-10-CM

## 2013-06-05 DIAGNOSIS — I519 Heart disease, unspecified: Secondary | ICD-10-CM

## 2013-06-05 DIAGNOSIS — I2510 Atherosclerotic heart disease of native coronary artery without angina pectoris: Secondary | ICD-10-CM

## 2013-06-05 DIAGNOSIS — I10 Essential (primary) hypertension: Secondary | ICD-10-CM

## 2013-06-05 DIAGNOSIS — I252 Old myocardial infarction: Secondary | ICD-10-CM

## 2013-06-05 DIAGNOSIS — I5189 Other ill-defined heart diseases: Secondary | ICD-10-CM

## 2013-06-05 NOTE — Progress Notes (Signed)
Clinical Summary [name] is a 68 y.o.male former patient of Dr Austin Bright, this is our first visit together. He is seen for the following medical problems.  1. CAD - Prior anterior wall MI in 08/2011, received DES. LVEF in setting of MI 35% - echo 11/2011 LVEF 50-55%, grade Bright diastolic dysfunction.  - denies any recent chest pain. No SOB or DOE. Exercises regularly without troubles - compliant with meds, he has remained effient since stent placement.  2. HTN - checks at home once weekly. Typically 130s/70s - compliant with meds  3. Hyperlipidemia - compliant with crestor - reports recent panel from Dr Austin Bright  4. Diastolic dysfunction - grade Bright diastolic dysfunction by most recent echo 11/2011 - no LE edema, no orthopnea - takes lasix prn as needed  5. Former tobacco - 2012 AAA US without aneurysm  Past Medical History  Diagnosis Date  . Gout   . Hypertension   . CAD (coronary artery disease)     a. Diagnosed 08/2011 with anterior STEMI due to thrombotic occlusion of mid LAD s/p thrombectomy, PTCA, DES placement 08/23/11.  . Ischemic cardiomyopathy     a. Initial EF 35% by cath 08/23/11, improved to 40-45% by echo 08/25/11.  . Bilateral pneumonia     Diagnosed after STEMI 08/2011  . Dyslipidemia   . Acute MI   . Shortness of breath   . CHF (congestive heart failure)   . Arthritis      No Known Allergies   Current Outpatient Prescriptions  Medication Sig Dispense Refill  . allopurinol (ZYLOPRIM) 300 MG tablet Take 300 mg by mouth daily.      Austin Bright. Alpha Lipoic Acid 200 MG CAPS Take 1 capsule by mouth daily.      Marland Kitchen. aspirin EC 81 MG tablet Take 81 mg by mouth daily.      . carvedilol (COREG) 3.125 MG tablet Take 3.125 mg by mouth 2 (two) times daily with a meal.      . CINNAMON PO Take 1 capsule by mouth daily.      . Coenzyme Q10 (CO Q-10) 100 MG CAPS Take 1 capsule by mouth daily.      . furosemide (LASIX) 20 MG tablet TAKE ONE TABLET BY MOUTH DAILY AS NEEDED. (TAKE  IF WEIGHT INCREASES BY 2 LBS.)  30 tablet  3  . Green Tea 150 MG CAPS Take 1 capsule by mouth daily.      Marland Kitchen. lisinopril (PRINIVIL,ZESTRIL) 10 MG tablet TAKE ONE TABLET BY MOUTH ONCE DAILY.  30 tablet  6  . NITROSTAT 0.4 MG SL tablet PLACE 1 TAB UNDER TONGUE EVERY 5 MIN IF NEEDED FOR CHEST PAIN. MAY USE 3 TIMES.NO RELIEF CALL 911.  25 tablet  3  . prasugrel (EFFIENT) 10 MG TABS Take 10 mg by mouth daily.      Marland Kitchen. RESVERATROL 100 MG CAPS Take 1 capsule by mouth daily.      . rosuvastatin (CRESTOR) 40 MG tablet Take 1 tablet (40 mg total) by mouth daily.  30 tablet  11  . Turmeric 500 MG CAPS Take 1 capsule by mouth daily.      . vitamin C (ASCORBIC ACID) 500 MG tablet Take 500 mg by mouth daily.       No current facility-administered medications for this visit.     Past Surgical History  Procedure Laterality Date  . No past surgeries    . Coronary stent placement    . Cystectomy  1969  . Carpel tunnel  2002     No Known Allergies    No family history on file.   Social History [name] reports that he quit smoking about 33 years ago. He has quit using smokeless tobacco. [name] reports that he drinks about 2.4 ounces of alcohol per week.   Review of Systems CONSTITUTIONAL: No weight loss, fever, chills, weakness or fatigue.  HEENT: Eyes: No visual loss, blurred vision, double vision or yellow sclerae.No hearing loss, sneezing, congestion, runny nose or sore throat.  SKIN: No rash or itching.  CARDIOVASCULAR: per HPI RESPIRATORY: No shortness of breath, cough or sputum.  GASTROINTESTINAL: No anorexia, nausea, vomiting or diarrhea. No abdominal pain or blood.  GENITOURINARY: No burning on urination, no polyuria NEUROLOGICAL: No headache, dizziness, syncope, paralysis, ataxia, numbness or tingling in the extremities. No change in bowel or bladder control.  MUSCULOSKELETAL: No muscle, back pain, joint pain or stiffness.  LYMPHATICS: No enlarged nodes. No history of  splenectomy.  PSYCHIATRIC: No history of depression or anxiety.  ENDOCRINOLOGIC: No reports of sweating, cold or heat intolerance. No polyuria or polydipsia.  Marland Kitchen.   Physical Examination p 67 bp 122/76 Wt 215 lbs BMI 31  Gen: resting comfortably, no acute distress HEENT: no scleral icterus, pupils equal round and reactive, no palptable cervical adenopathy,  CV: RRR, no m/r/g, no JVD, no carotid bruits Resp: Clear to auscultation bilaterally GI: abdomen is soft, non-tender, non-distended, normal bowel sounds, no hepatosplenomegaly MSK: extremities are warm, no edema.  Skin: warm, no rash Neuro:  no focal deficits Psych: appropriate affect   Diagnostic Studies 11/2011 Echo Study Conclusions  - Left ventricle: The cavity size was normal. Wall thickness was normal. Systolic function was normal. The estimated ejection fraction was in the range of 50% to 55%. There is hypokinesis of the mid-distalinferoseptal myocardium. There is hypokinesis of the distalanteroseptal myocardium. Features are consistent with a pseudonormal left ventricular filling pattern, with concomitant abnormal relaxation and increased filling pressure (grade 2 diastolic dysfunction). Doppler parameters are consistent with elevated ventricular end-diastolic filling pressure. - Aortic valve: Mildly calcified annulus. Trileaflet; mildly calcified leaflets. - Mitral valve: Trivial regurgitation. - Left atrium: The atrium was mildly dilated. - Right ventricle: The cavity size was mildly dilated. - Tricuspid valve: Trivial regurgitation. - Pulmonary arteries: PA peak pressure: 35mm Hg (S). - Pericardium, extracardiac: There was no pericardial effusion.     06/05/13 EKG: NSR, LAFB, old anterioseptal MI   Assessment and Plan  1. CAD - no current symptoms - has completed over a year of DAPT, no indication for continued use. Will stop effient, continue ASA  2. HTN - at goal, continue current meds  3.  Hyperlipidemia - will request most recent lipid panel from pcp  4. Diastolic dysfunction - euvolemic in clinic today, no significant symptoms - continue prn lasix  5. Former tobacco - negative [place] AAA in 2012      Antoine PocheJonathan F. Branch, M.D., F.A.C.C.

## 2013-06-05 NOTE — Patient Instructions (Addendum)
Your physician wants you to follow-up in: 1 year You will receive a reminder letter in the mail two months in advance. If you don't receive a letter, please call our office to schedule the follow-up appointment.  Your physician has recommended you make the following change in your medication:   STOP Effient    Thank you for choosing [facility] !

## 2013-06-09 ENCOUNTER — Encounter: Payer: Self-pay | Admitting: Cardiology

## 2013-06-18 IMAGING — CR DG CHEST 1V PORT
1 series · 1 of 1 positions shown · non-contrast
Comparison: 08/23/2011

CLINICAL DATA: Evaluate for CHF, shortness of breath

PORTABLE CHEST - 1 VIEW

[AP]
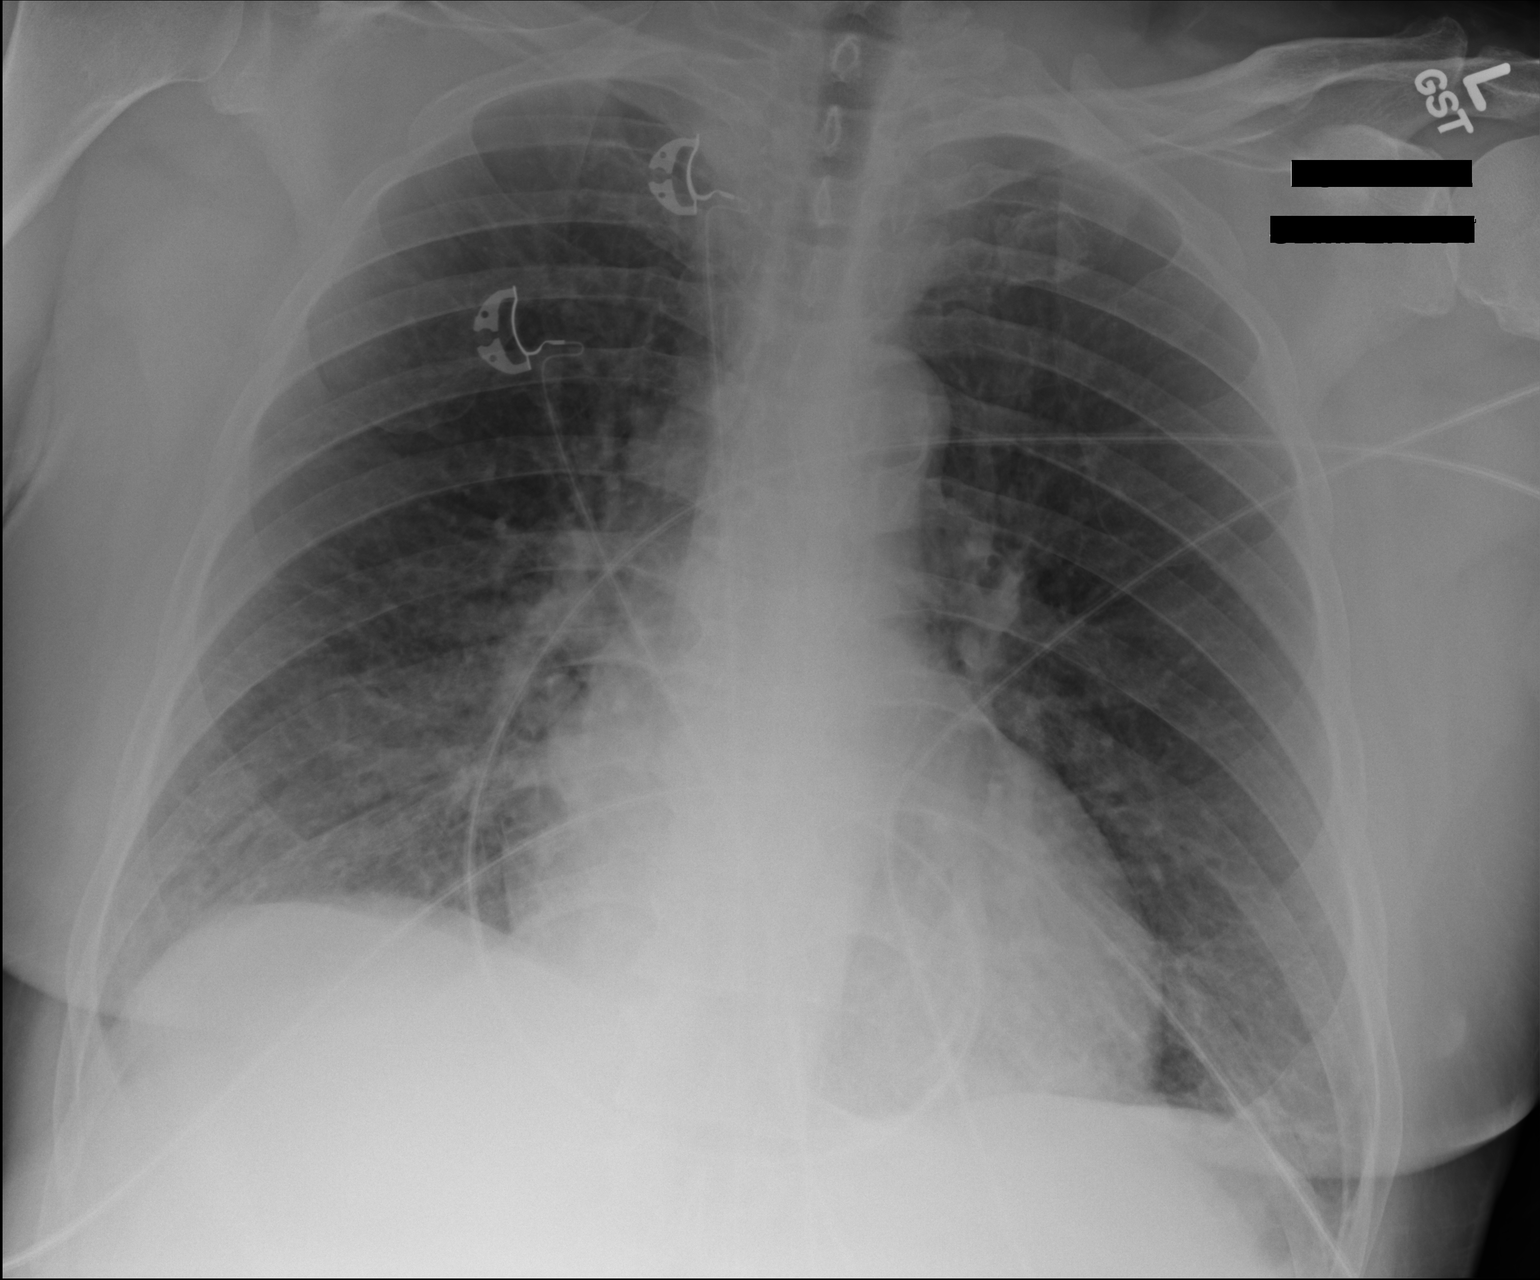

[1 of 1 positions shown; findings below may reference images not displayed]

FINDINGS: Grossly unchanged enlarged cardiac silhouette and
mediastinal contours with atherosclerotic calcification in the
aortic arch.  Pulmonary vasculature remains indistinct with
cephalization of flow.  Grossly unchanged bibasilar heterogeneous
opacities.  There is persistent mild elevation of the right
hemidiaphragm.  No definite pleural effusion or pneumothorax.
Unchanged bones.
IMPRESSION: Grossly unchanged findings suggestive of mild pulmonary edema,
though note, atypical infection may have a similar appearance.
Further evaluation with a PA and lateral chest radiograph may be
obtained as clinically indicated.

## 2013-06-20 IMAGING — CR DG CHEST 1V PORT
1 series · 1 of 1 positions shown · non-contrast
Comparison: [DATE] and 08/23/2011

CLINICAL DATA: Fever.

PORTABLE CHEST - 1 VIEW

[AP]
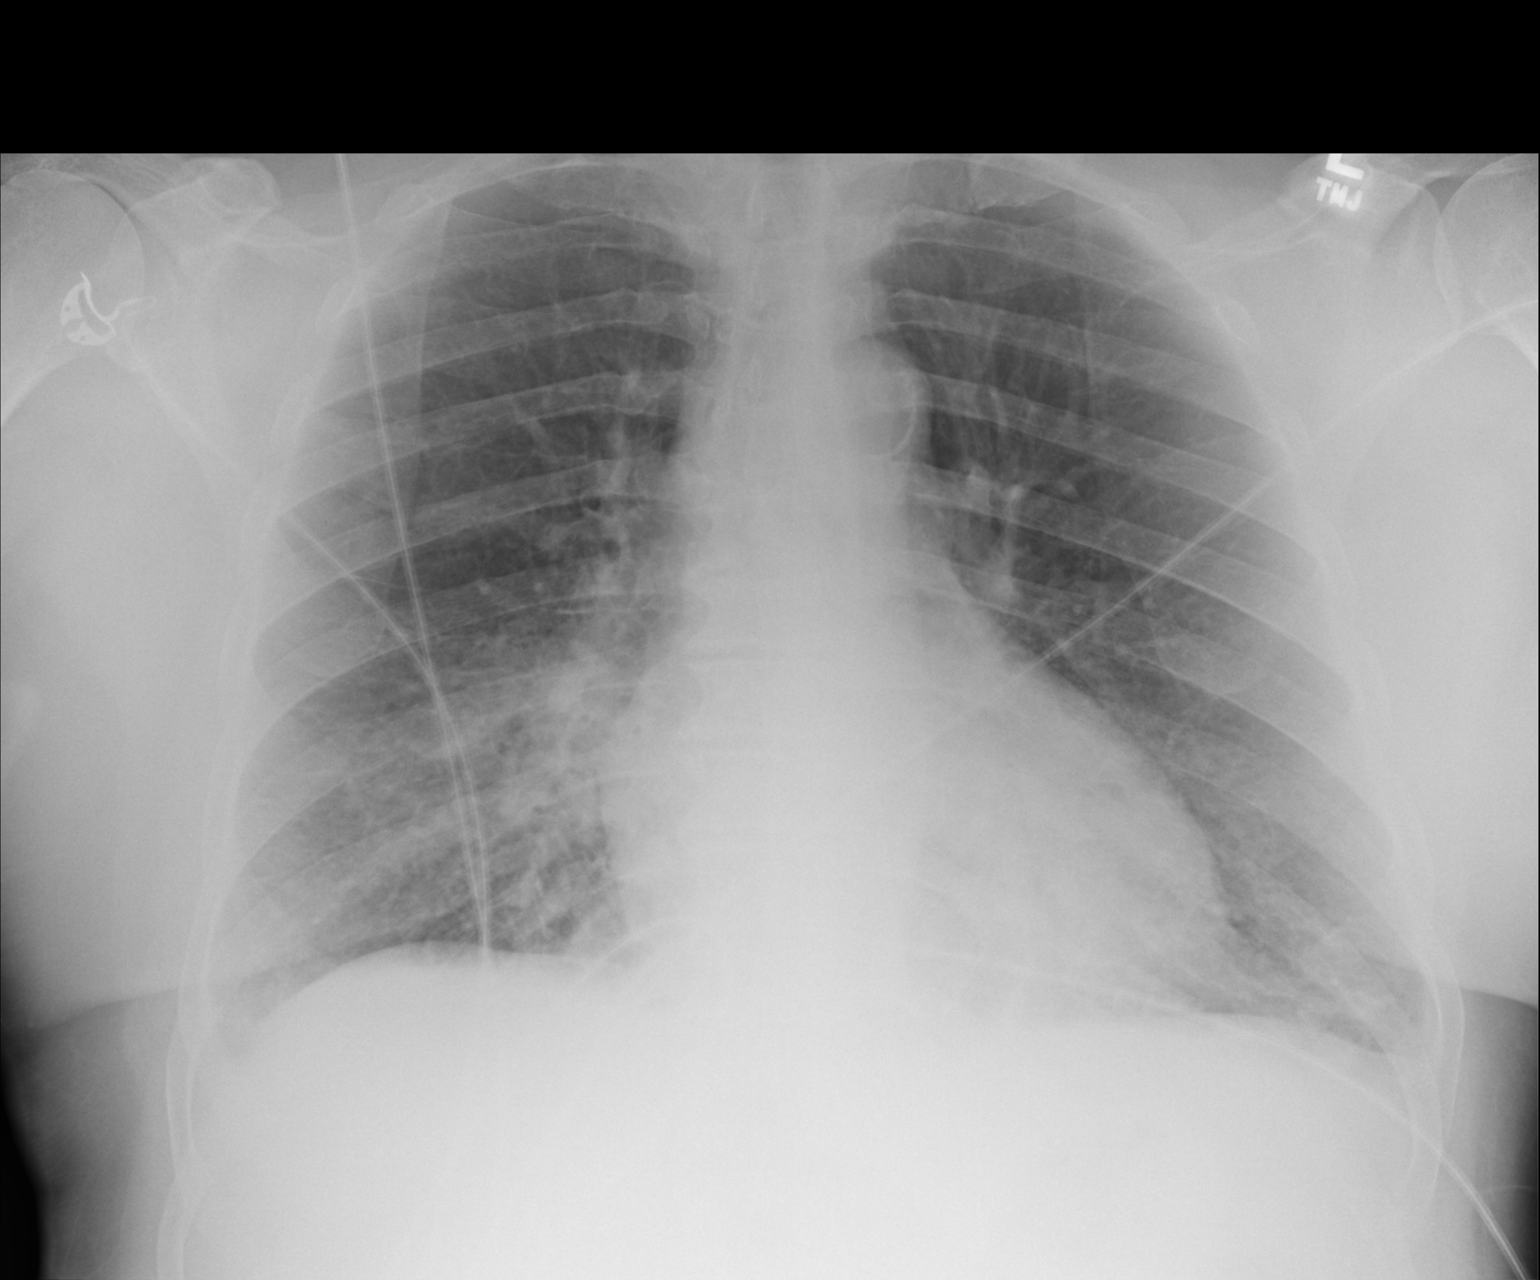

[1 of 1 positions shown; findings below may reference images not displayed]

FINDINGS: The patient has patchy bilateral lower lobe pneumonias
which have developed since the prior exam.  Heart size and
vascularity are normal.  No definitive effusions.

No acute osseous abnormality.
IMPRESSION: Interval development of patchy bilateral lower lobe pneumonias.

## 2013-07-14 ENCOUNTER — Telehealth: Payer: Self-pay | Admitting: *Deleted

## 2013-07-14 NOTE — Telephone Encounter (Signed)
Pt walked in with complaint of dry cough, sore throat and a little congestion. Pt stated lisinopril 3 weeks ago and they symptoms has been about them same time on going.

## 2013-07-14 NOTE — Telephone Encounter (Signed)
Called pt he states he started Lisinopril in 2013. So cough may not be bc of med. Made pt aware to see PCP for symptoms. Pt states he already has an appointment.

## 2013-07-30 ENCOUNTER — Other Ambulatory Visit: Payer: Self-pay | Admitting: Adult Health

## 2013-11-24 ENCOUNTER — Other Ambulatory Visit: Payer: Self-pay | Admitting: Cardiology

## 2013-12-28 ENCOUNTER — Encounter (HOSPITAL_COMMUNITY): Payer: Self-pay | Admitting: Cardiovascular Disease

## 2014-02-07 ENCOUNTER — Other Ambulatory Visit: Payer: Self-pay

## 2014-02-07 MED ORDER — FUROSEMIDE 20 MG PO TABS: ORAL_TABLET | ORAL | Status: DC

## 2014-04-04 ENCOUNTER — Other Ambulatory Visit: Payer: Self-pay | Admitting: Cardiology

## 2014-07-10 ENCOUNTER — Other Ambulatory Visit: Payer: Self-pay | Admitting: Cardiology

## 2014-07-10 DIAGNOSIS — I2510 Atherosclerotic heart disease of native coronary artery without angina pectoris: Secondary | ICD-10-CM | POA: Diagnosis not present

## 2014-07-10 DIAGNOSIS — A059 Bacterial foodborne intoxication, unspecified: Secondary | ICD-10-CM | POA: Diagnosis not present

## 2014-07-10 DIAGNOSIS — I10 Essential (primary) hypertension: Secondary | ICD-10-CM | POA: Diagnosis not present

## 2014-07-16 ENCOUNTER — Other Ambulatory Visit: Payer: Self-pay

## 2014-07-20 ENCOUNTER — Ambulatory Visit (INDEPENDENT_AMBULATORY_CARE_PROVIDER_SITE_OTHER): Payer: Medicare HMO | Admitting: Cardiology

## 2014-07-20 ENCOUNTER — Encounter: Payer: Self-pay | Admitting: Cardiology

## 2014-07-20 VITALS — BP 138/86 | HR 68 | Ht 70.0 in | Wt 218.0 lb

## 2014-07-20 DIAGNOSIS — I10 Essential (primary) hypertension: Secondary | ICD-10-CM | POA: Diagnosis not present

## 2014-07-20 DIAGNOSIS — I5189 Other ill-defined heart diseases: Secondary | ICD-10-CM

## 2014-07-20 DIAGNOSIS — I5040 Unspecified combined systolic (congestive) and diastolic (congestive) heart failure: Secondary | ICD-10-CM | POA: Diagnosis not present

## 2014-07-20 DIAGNOSIS — I2510 Atherosclerotic heart disease of native coronary artery without angina pectoris: Secondary | ICD-10-CM | POA: Diagnosis not present

## 2014-07-20 DIAGNOSIS — I519 Heart disease, unspecified: Secondary | ICD-10-CM | POA: Diagnosis not present

## 2014-07-20 DIAGNOSIS — E785 Hyperlipidemia, unspecified: Secondary | ICD-10-CM | POA: Diagnosis not present

## 2014-07-20 MED ORDER — NITROGLYCERIN 0.4 MG SL SUBL: SUBLINGUAL_TABLET | SUBLINGUAL | Status: DC

## 2014-07-20 NOTE — Progress Notes (Signed)
Clinical Summary Mr. Austin Austin is a 69 y.o.male seen today for follow up of the following medical problems.   1. CAD - Prior anterior wall MI in 08/2011, received DES. LVEF in setting of MI 35% - echo 11/2011 LVEF 50-55%, grade Austin Austin.   - no chest pain, no SOB or DOE - compliant with meds  2. HTN - checks at home once weekly. Typically 140s/80s - compliant with meds  3. Hyperlipidemia - compliant with crestor - 03/2013 TC 139 TG 58 HDL 50 LDL 77 - has labs upcoming 08/2014 with pcp  4. Diastolic Austin - grade Austin Austin by most recent echo 11/2011 - no LE edema, no orthopnea - takes lasix prn as needed  5. Former tobacco - 2012 AAA [place] without aneurysm Past Medical History  Diagnosis Date  . Gout   . Hypertension   . CAD (coronary artery disease)     a. Diagnosed 08/2011 with anterior STEMI due to thrombotic occlusion of mid LAD s/p thrombectomy, PTCA, DES placement 08/23/11.  . Ischemic cardiomyopathy     a. Initial EF 35% by cath 08/23/11, improved to 40-45% by echo 08/25/11.  . Bilateral pneumonia     Diagnosed after STEMI 08/2011  . Dyslipidemia   . Acute MI   . Shortness of breath   . CHF (congestive heart failure)   . Arthritis      No Known Allergies   Current Outpatient Prescriptions  Medication Sig Dispense Refill  . allopurinol (ZYLOPRIM) 300 MG tablet Take 300 mg by mouth daily.    Austin Austin Lipoic Acid 200 MG CAPS Take 1 capsule by mouth daily.    Marland Kitchen aspirin EC 81 MG tablet Take 81 mg by mouth daily.    . carvedilol (COREG) 3.125 MG tablet Take 3.125 mg by mouth 2 (two) times daily with a meal.    . CINNAMON PO Take 1 capsule by mouth daily.    . Coenzyme Q10 (CO Q-10) 100 MG CAPS Take 1 capsule by mouth daily.    . CRESTOR 40 MG tablet TAKE ONE TABLET BY MOUTH ONCE DAILY. 30 tablet 3  . furosemide (LASIX) 20 MG tablet TAKE ONE TABLET BY MOUTH DAILY AS NEEDED. (TAKE IF WEIGHT INCREASES BY 2 LBS.) 30 tablet 3  .  Green Tea 150 MG CAPS Take 1 capsule by mouth daily.    Marland Kitchen lisinopril (PRINIVIL,ZESTRIL) 10 MG tablet TAKE ONE TABLET BY MOUTH ONCE DAILY. 90 tablet 3  . NITROSTAT 0.4 MG SL tablet PLACE 1 TAB UNDER TONGUE EVERY 5 MIN IF NEEDED FOR CHEST PAIN. MAY USE 3 TIMES.NO RELIEF CALL 911. 25 tablet 3  . RESVERATROL 100 MG CAPS Take 1 capsule by mouth daily.    . Turmeric 500 MG CAPS Take 1 capsule by mouth daily.    . vitamin C (ASCORBIC ACID) 500 MG tablet Take 500 mg by mouth daily.     No current facility-administered medications for this visit.     Past Surgical History  Procedure Laterality Date  . No past surgeries    . Coronary stent placement    . Cystectomy  1969  . Carpel tunnel  2002  . Left heart cath N/A 08/23/2011    Procedure: LEFT HEART CATH;  Surgeon: Iran Ouch, MD;  Location: [facility] CATH LAB;  Service: Cardiovascular;  Laterality: N/A;  . Percutaneous coronary stent intervention (pci-s)  08/23/2011    Procedure: PERCUTANEOUS CORONARY STENT INTERVENTION (PCI-S);  Surgeon: Jerolyn Center  Argentina DonovanA Arida, MD;  Location: [facility] CATH LAB;  Service: Cardiovascular;;  . Left and right heart catheterization with coronary angiogram N/A 09/15/2011    Procedure: LEFT AND RIGHT HEART CATHETERIZATION WITH CORONARY ANGIOGRAM;  Surgeon: Herby Abrahamhomas D Stuckey, MD;  [facility];  Service: Cardiovascular;  Laterality: N/A;     No Known Allergies    No family history on file.   Social History Austin Austin reports that he quit smoking about 34 years ago. He has quit using smokeless tobacco. Austin Austin reports that he drinks about 2.4 oz of alcohol per week.   Review of Systems CONSTITUTIONAL: No weight loss, fever, chills, weakness or fatigue.  HEENT: Eyes: No visual loss, blurred vision, double vision or yellow sclerae.No hearing loss, sneezing, congestion, runny nose or sore throat.  SKIN: No rash or itching.  CARDIOVASCULAR: per HPI RESPIRATORY: No shortness of breath, cough or sputum.    GASTROINTESTINAL: No anorexia, nausea, vomiting or diarrhea. No abdominal pain or blood.  GENITOURINARY: No burning on urination, no polyuria NEUROLOGICAL: No headache, dizziness, syncope, paralysis, ataxia, numbness or tingling in the extremities. No change in bowel or bladder control.  MUSCULOSKELETAL: No muscle, back pain, joint pain or stiffness.  LYMPHATICS: No enlarged nodes. No history of splenectomy.  PSYCHIATRIC: No history of depression or anxiety.  ENDOCRINOLOGIC: No reports of sweating, cold or heat intolerance. No polyuria or polydipsia.  Marland Kitchen.   Physical Examination Filed Vitals:   07/20/14 1329  BP: 138/86  Pulse: 68   Filed Vitals:   07/20/14 1329  Height: 5\' 10"  (1.778 m)  Weight: 218 lb (98.884 kg)    Gen: resting comfortably, no acute distress HEENT: no scleral icterus, pupils equal round and reactive, no palptable cervical adenopathy,  CV: RRR, no m/r/g/, no JVD Resp: Clear to auscultation bilaterally GI: abdomen is soft, non-tender, non-distended, normal bowel sounds, no hepatosplenomegaly MSK: extremities are warm, no edema.  Skin: warm, no rash Neuro:  no focal deficits Psych: appropriate affect   Diagnostic Studies 11/2011 Echo Study Conclusions  - Left ventricle: The cavity size was normal. Wall thickness was normal. Systolic function was normal. The estimated ejection fraction was in the range of 50% to 55%. There is hypokinesis of the mid-distalinferoseptal myocardium. There is hypokinesis of the distalanteroseptal myocardium. Features are consistent with a pseudonormal left ventricular filling pattern, with concomitant abnormal relaxation and increased filling pressure (grade 2 diastolic Austin). Doppler parameters are consistent with elevated ventricular end-diastolic filling pressure. - Aortic valve: Mildly calcified annulus. Trileaflet; mildly calcified leaflets. - Mitral valve: Trivial regurgitation. - Left atrium: The atrium was  mildly dilated. - Right ventricle: The cavity size was mildly dilated. - Tricuspid valve: Trivial regurgitation. - Pulmonary arteries: PA peak pressure: 35mm Hg (S). - Pericardium, extracardiac: There was no pericardial effusion.    Assessment and Plan  1. CAD - no current symptoms - continue risk factor modification and secondary prevention  2. HTN - at goal, continue current meds  3. Hyperlipidemia - continue high dose statin - f/u lipid panel by pcp  4. Diastolic Austin - euvolemic in clinic today, no significant symptoms - continue prn lasix  5. Former tobacco - negative [place] AAA in 2012  F/u 1 year    Antoine PocheJonathan F. Shalom Mcguiness, M.D.

## 2014-07-20 NOTE — Patient Instructions (Signed)
Your physician wants you to follow-up in: 1 year with DrBranch You will receive a reminder letter in the mail two months in advance. If you don't receive a letter, please call our office to schedule the follow-up appointment.     Your physician recommends that you continue on your current medications as directed. Please refer to the Current Medication list given to you today.      Thank you for choosing [facility] !        

## 2014-07-24 ENCOUNTER — Other Ambulatory Visit: Payer: Self-pay | Admitting: Adult Health

## 2014-10-02 DIAGNOSIS — L821 Other seborrheic keratosis: Secondary | ICD-10-CM | POA: Diagnosis not present

## 2014-10-02 DIAGNOSIS — L720 Epidermal cyst: Secondary | ICD-10-CM | POA: Diagnosis not present

## 2014-12-10 DIAGNOSIS — Z1211 Encounter for screening for malignant neoplasm of colon: Secondary | ICD-10-CM | POA: Diagnosis not present

## 2014-12-10 DIAGNOSIS — Z0000 Encounter for general adult medical examination without abnormal findings: Secondary | ICD-10-CM | POA: Diagnosis not present

## 2014-12-11 DIAGNOSIS — I110 Hypertensive heart disease with heart failure: Secondary | ICD-10-CM | POA: Diagnosis not present

## 2014-12-11 DIAGNOSIS — M1990 Unspecified osteoarthritis, unspecified site: Secondary | ICD-10-CM | POA: Diagnosis not present

## 2014-12-11 DIAGNOSIS — E785 Hyperlipidemia, unspecified: Secondary | ICD-10-CM | POA: Diagnosis not present

## 2014-12-11 DIAGNOSIS — I2510 Atherosclerotic heart disease of native coronary artery without angina pectoris: Secondary | ICD-10-CM | POA: Diagnosis not present

## 2014-12-11 DIAGNOSIS — Z0000 Encounter for general adult medical examination without abnormal findings: Secondary | ICD-10-CM | POA: Diagnosis not present

## 2014-12-11 DIAGNOSIS — I509 Heart failure, unspecified: Secondary | ICD-10-CM | POA: Diagnosis not present

## 2014-12-11 DIAGNOSIS — Z125 Encounter for screening for malignant neoplasm of prostate: Secondary | ICD-10-CM | POA: Diagnosis not present

## 2014-12-21 ENCOUNTER — Other Ambulatory Visit: Payer: Self-pay | Admitting: Cardiology

## 2015-02-19 ENCOUNTER — Other Ambulatory Visit: Payer: Self-pay | Admitting: Cardiology

## 2015-03-06 ENCOUNTER — Other Ambulatory Visit: Payer: Self-pay

## 2015-03-06 MED ORDER — ROSUVASTATIN CALCIUM 40 MG PO TABS: 40.0000 mg | ORAL_TABLET | Freq: Every day | ORAL | Status: DC

## 2015-03-06 NOTE — Telephone Encounter (Signed)
Refill complete 

## 2015-08-08 ENCOUNTER — Ambulatory Visit (INDEPENDENT_AMBULATORY_CARE_PROVIDER_SITE_OTHER): Payer: Medicare Other | Admitting: Cardiology

## 2015-08-08 ENCOUNTER — Encounter: Payer: Self-pay | Admitting: Cardiology

## 2015-08-08 VITALS — BP 140/96 | HR 67 | Ht 70.0 in | Wt 202.0 lb

## 2015-08-08 DIAGNOSIS — I10 Essential (primary) hypertension: Secondary | ICD-10-CM

## 2015-08-08 DIAGNOSIS — E785 Hyperlipidemia, unspecified: Secondary | ICD-10-CM | POA: Diagnosis not present

## 2015-08-08 DIAGNOSIS — I519 Heart disease, unspecified: Secondary | ICD-10-CM | POA: Diagnosis not present

## 2015-08-08 DIAGNOSIS — I5189 Other ill-defined heart diseases: Secondary | ICD-10-CM

## 2015-08-08 DIAGNOSIS — I2510 Atherosclerotic heart disease of native coronary artery without angina pectoris: Secondary | ICD-10-CM | POA: Diagnosis not present

## 2015-08-08 MED ORDER — SILDENAFIL CITRATE 100 MG PO TABS: ORAL_TABLET | ORAL | Status: DC

## 2015-08-08 NOTE — Progress Notes (Addendum)
Clinical Summary [name] is a 70 y.o.male seen today for follow up of the following medical problems.   1. CAD - Prior anterior wall MI in 08/2011, received DES. LVEF in setting of MI 35% - echo 11/2011 LVEF 50-55%, grade Bright diastolic dysfunction.   - no chest pain, no SOB or DOE since last visit.  - compliant with meds  2. HTN -does  not check regularly at home - compliant with meds  3. Hyperlipidemia - compliant with crestor -upcoming labs with pcp.   4. Diastolic dysfunction - grade Bright diastolic dysfunction by most recent echo 11/2011 - no recent LE edema, no orthopnea - only takes lasix prn as needed  5. Former tobacco - 2012 AAA US without aneurysm    SH: wife recently passed May 2017, laryngeal cancer. . Works as Catering managerfire investigator.  Past Medical History  Diagnosis Date  . Gout   . Hypertension   . CAD (coronary artery disease)     a. Diagnosed 08/2011 with anterior STEMI due to thrombotic occlusion of mid LAD s/p thrombectomy, PTCA, DES placement 08/23/11.  . Ischemic cardiomyopathy     a. Initial EF 35% by cath 08/23/11, improved to 40-45% by echo 08/25/11.  . Bilateral pneumonia     Diagnosed after STEMI 08/2011  . Dyslipidemia   . Acute MI   . Shortness of breath   . CHF (congestive heart failure)   . Arthritis      No Known Allergies   Current Outpatient Prescriptions  Medication Sig Dispense Refill  . allopurinol (ZYLOPRIM) 300 MG tablet Take 300 mg by mouth daily.    Allen Kell. Alpha Lipoic Acid 200 MG CAPS Take 1 capsule by mouth daily.    Marland Kitchen. aspirin EC 81 MG tablet Take 81 mg by mouth daily.    . carvedilol (COREG) 3.125 MG tablet Take 3.125 mg by mouth 2 (two) times daily with a meal.    . CINNAMON PO Take 1 capsule by mouth daily.    . Coenzyme Q10 (CO Q-10) 100 MG CAPS Take 1 capsule by mouth daily.    . furosemide (LASIX) 20 MG tablet TAKE ONE TABLET BY MOUTH DAILY AS NEEDED. (TAKE IF WEIGHT INCREASES BY 2 LBS.) 30 tablet 3  . Green Tea 150 MG  CAPS Take 1 capsule by mouth daily.    Marland Kitchen. lisinopril (PRINIVIL,ZESTRIL) 10 MG tablet TAKE ONE TABLET BY MOUTH ONCE DAILY. 90 tablet 3  . nitroGLYCERIN (NITROSTAT) 0.4 MG SL tablet PLACE 1 TAB UNDER TONGUE EVERY 5 MIN IF NEEDED FOR CHEST PAIN. MAY USE 3 TIMES.NO RELIEF CALL 911. 25 tablet 3  . RESVERATROL 100 MG CAPS Take 1 capsule by mouth daily.    . rosuvastatin (CRESTOR) 40 MG tablet Take 1 tablet (40 mg total) by mouth daily. 90 tablet 3  . Turmeric 500 MG CAPS Take 1 capsule by mouth daily.    . vitamin C (ASCORBIC ACID) 500 MG tablet Take 500 mg by mouth daily.     No current facility-administered medications for this visit.     Past Surgical History  Procedure Laterality Date  . No past surgeries    . Coronary stent placement    . Cystectomy  1969  . Carpel tunnel  2002  . Left heart cath N/A 08/23/2011    Procedure: LEFT HEART CATH;  Surgeon: Iran OuchMuhammad A Arida, MD;  [facility];  Service: Cardiovascular;  Laterality: N/A;  . Percutaneous coronary stent intervention (pci-s)  08/23/2011    Procedure: PERCUTANEOUS CORONARY STENT INTERVENTION (PCI-S);  Surgeon: Iran Ouch, MD;  Location: [facility] CATH LAB;  Service: Cardiovascular;;  . Left and right heart catheterization with coronary angiogram N/A 09/15/2011    Procedure: LEFT AND RIGHT HEART CATHETERIZATION WITH CORONARY ANGIOGRAM;  Surgeon: Herby Abraham, MD;  Location[facility] C[facility] LAB;  Service: Cardiovascular;  Laterality: N/A;     No Known Allergies    No family history on file.   Social History [name] reports that he quit smoking about 35 years ago. He has quit using smokeless tobacco. [name] reports that he drinks about 2.4 oz of alcohol per week.   Review of Systems CONSTITUTIONAL: No weight loss, fever, chills, weakness or fatigue.  HEENT: Eyes: No visual loss, blurred vision, double vision or yellow sclerae.No hearing loss, sneezing, congestion, runny nose or sore throat.  SKIN: No rash or  itching.  CARDIOVASCULAR: per HPI RESPIRATORY: No shortness of breath, cough or sputum.  GASTROINTESTINAL: No anorexia, nausea, vomiting or diarrhea. No abdominal pain or blood.  GENITOURINARY: No burning on urination, no polyuria NEUROLOGICAL: No headache, dizziness, syncope, paralysis, ataxia, numbness or tingling in the extremities. No change in bowel or bladder control.  MUSCULOSKELETAL: No muscle, back pain, joint pain or stiffness.  LYMPHATICS: No enlarged nodes. No history of splenectomy.  PSYCHIATRIC: No history of depression or anxiety.  ENDOCRINOLOGIC: No reports of sweating, cold or heat intolerance. No polyuria or polydipsia.  Marland Kitchen   Physical Examination Filed Vitals:   08/08/15 1006  BP: 140/96  Pulse: 67   Filed Vitals:   08/08/15 1006  Height:  (1.778 m)  Weight: 202 lb (91.627 kg)    Gen: resting comfortably, no acute distress HEENT: no scleral icterus, pupils equal round and reactive, no palptable cervical adenopathy,  CV: RRR, no m/r/g, no jvd Resp: Clear to auscultation bilaterally GI: abdomen is soft, non-tender, non-distended, normal bowel sounds, no hepatosplenomegaly MSK: extremities are warm, no edema.  Skin: warm, no rash Neuro:  no focal deficits Psych: appropriate affect   Diagnostic Studies 11/2011 Echo Study Conclusions  - Left ventricle: The cavity size was normal. Wall thickness was normal. Systolic function was normal. The estimated ejection fraction was in the range of 50% to 55%. There is hypokinesis of the mid-distalinferoseptal myocardium. There is hypokinesis of the distalanteroseptal myocardium. Features are consistent with a pseudonormal left ventricular filling pattern, with concomitant abnormal relaxation and increased filling pressure (grade 2 diastolic dysfunction). Doppler parameters are consistent with elevated ventricular end-diastolic filling pressure. - Aortic valve: Mildly calcified annulus. Trileaflet;  mildly calcified leaflets. - Mitral valve: Trivial regurgitation. - Left atrium: The atrium was mildly dilated. - Right ventricle: The cavity size was mildly dilated. - Tricuspid valve: Trivial regurgitation. - Pulmonary arteries: PA peak pressure: 35mm Hg (S). - Pericardium, extracardiac: There was no pericardial effusion.    Assessment and Plan  1. CAD - no current symptoms. EKG in clnic shows SR, LAD with LAFB, anteroseptal Qwaves. No acute ischemic changes - we will continue risk factor modification and secondary prevention  2. HTN - mildly elevated. He will keep a home bp log and submit to pcp next week at his appointment.   3. Hyperlipidemia - continue high dose statin - f/u upcoming lipid panel by pcp  4. Diastolic dysfunction - euvolemic in clinic today, he has no significant symptoms - continue prn lasix  5. Former tobacco - negative [place] AAA in 2012   6. Erectile  dysfunction - given Rx for prn viagra. Instructed not to take with his SL NG   F/u 1 year.    Antoine Poche, M.D.

## 2015-08-08 NOTE — Patient Instructions (Signed)
Medication Instructions:  START VIAGRA 50 MG AS NEEDED  TAKE 1/2 TABLET 30 MINUTES PRIOR TO SEXUAL ACTIVITY  Labwork: NONE  Testing/Procedures: NONE  Follow-Up: Your physician wants you to follow-up in: 1 YEAR.  You will receive a reminder letter in the mail two months in advance. If you don't receive a letter, please call our office to schedule the follow-up appointment.   Any Other Special Instructions Will Be Listed Below (If Applicable).     If you need a refill on your cardiac medications before your next appointment, please call your pharmacy.

## 2015-08-31 ENCOUNTER — Other Ambulatory Visit: Payer: Self-pay | Admitting: Cardiology

## 2015-09-10 ENCOUNTER — Encounter: Payer: Self-pay | Admitting: Orthopaedic Surgery

## 2015-09-10 ENCOUNTER — Ambulatory Visit (INDEPENDENT_AMBULATORY_CARE_PROVIDER_SITE_OTHER): Payer: Medicare Other | Admitting: Orthopaedic Surgery

## 2015-09-10 VITALS — BP 142/94 | HR 71 | Ht 69.0 in | Wt 213.0 lb

## 2015-09-10 DIAGNOSIS — S76302A Unspecified injury of muscle, fascia and tendon of the posterior muscle group at thigh level, left thigh, initial encounter: Secondary | ICD-10-CM

## 2015-09-10 NOTE — Progress Notes (Signed)
Subjective: I hurt my left hamstring    Patient ID: Austin CampbellJerry D Kock Bright, male    DOB: 10-30-1945, 70 y.o.   MRN: [id number]  HPI He was playing golf about 8 days ago and started having some marked pain of the left medial posterior upper thigh.  He was unable to complete the game.  He had pain in the left medial hamstring area.  He iced it down, took Advil and felt better.  A couple of days later he tried playing again and had to stop by the sixth hole.  He continues to have pain when seating a while.  He can walk with no pain.  He has no new trauma.  He has taken Advil.  He uses ice. Both help.  He is concerned he still has pain.  He has not played any golf over the last few days.  He has no swelling or redness.   Review of Systems  HENT: Negative for congestion.   Respiratory: Negative for cough and shortness of breath.   Cardiovascular: Negative for chest pain and leg swelling.  Endocrine: Negative for cold intolerance.  Musculoskeletal: Positive for arthralgias and gait problem.  Allergic/Immunologic: Negative for environmental allergies.   Past Medical History:  Diagnosis Date  . Acute MI (HCC)   . Arthritis   . Bilateral pneumonia    Diagnosed after STEMI 08/2011  . CAD (coronary artery disease)    a. Diagnosed 08/2011 with anterior STEMI due to thrombotic occlusion of mid LAD s/p thrombectomy, PTCA, DES placement 08/23/11.  Marland Kitchen. CHF (congestive heart failure) (HCC)   . Dyslipidemia   . Gout   . Hypertension   . Ischemic cardiomyopathy    a. Initial EF 35% by cath 08/23/11, improved to 40-45% by echo 08/25/11.  Marland Kitchen. Shortness of breath     Past Surgical History:  Procedure Laterality Date  . carpel tunnel  2002  . CORONARY STENT PLACEMENT    . CYSTECTOMY  1969  . LEFT AND RIGHT HEART CATHETERIZATION WITH CORONARY ANGIOGRAM N/A 09/15/2011   Procedure: LEFT AND RIGHT HEART CATHETERIZATION WITH CORONARY ANGIOGRAM;  Surgeon: Herby Abrahamhomas D Stuckey, MD;  [facility];  Service:  Cardiovascular;  Laterality: N/A;  . LEFT HEART CATH N/A 08/23/2011   Procedure: LEFT HEART CATH;  Surgeon: Iran OuchMuhammad A Arida, MD;  Location: [facility] CATH LAB;  Service: Cardiovascular;  Laterality: N/A;  . NO PAST SURGERIES    . PERCUTANEOUS CORONARY STENT INTERVENTION (PCI-S)  08/23/2011   Procedure: PERCUTANEOUS CORONARY STENT INTERVENTION (PCI-S);  Surgeon: Iran OuchMuhammad A Arida, MD;  [facility];  Service: Cardiovascular;;    Current Outpatient Prescriptions on File Prior to Visit  Medication Sig Dispense Refill  . allopurinol (ZYLOPRIM) 300 MG tablet Take 300 mg by mouth daily.    Allen Kell. Alpha Lipoic Acid 200 MG CAPS Take 1 capsule by mouth daily.    Marland Kitchen. aspirin EC 81 MG tablet Take 81 mg by mouth daily.    . carvedilol (COREG) 3.125 MG tablet Take 3.125 mg by mouth 2 (two) times daily with a meal.    . CINNAMON PO Take 1 capsule by mouth daily.    . Coenzyme Q10 (CO Q-10) 100 MG CAPS Take 1 capsule by mouth daily.    . furosemide (LASIX) 20 MG tablet TAKE ONE TABLET BY MOUTH DAILY AS NEEDED. (TAKE IF WEIGHT INCREASES BY 2 LBS.) 30 tablet 3  . Green Tea 150 MG CAPS Take 1 capsule by mouth daily.    .Marland Kitchen  lisinopril (PRINIVIL,ZESTRIL) 10 MG tablet TAKE ONE TABLET BY MOUTH ONCE DAILY. 90 tablet 3  . nitroGLYCERIN (NITROSTAT) 0.4 MG SL tablet PLACE 1 TAB UNDER TONGUE EVERY 5 MIN IF NEEDED FOR CHEST PAIN. MAY USE 3 TIMES.NO RELIEF CALL 911. 25 tablet 3  . RESVERATROL 100 MG CAPS Take 1 capsule by mouth daily.    . rosuvastatin (CRESTOR) 40 MG tablet Take 1 tablet (40 mg total) by mouth daily. 90 tablet 3  . sildenafil (VIAGRA) 100 MG tablet TAKE 50 MG (1/2 TABLET) 30 MINUTES BEFORE SEXUAL ACTIVITY 4 tablet 11  . Turmeric 500 MG CAPS Take 1 capsule by mouth daily.    . vitamin C (ASCORBIC ACID) 500 MG tablet Take 500 mg by mouth daily.     No current facility-administered medications on file prior to visit.     Social History   Social History  . Marital status: Married    Spouse name: N/A  .  Number of children: N/A  . Years of education: N/A   Occupational History  . 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 Smoking status: Former Smoker    Quit date: 08/23/1979  . Smokeless tobacco: Former NeurosurgeonUser  . Alcohol use 2.4 oz/week    2 Cans of beer, 2 Shots of liquor per week     Comment: occasional  . Drug use: No  . Sexual activity: Yes   Other Topics Concern  . Not on file   Social History Narrative   [place], is out of work till [employer]-Sept 2013 after MI. He is an only child.    Heart disease runs in the family.  BP (!) 142/94   Pulse 71   Ht 5\' 9"  (1.753 m)   Wt 213 lb (96.6 kg)   BMI 31.45 kg/m      Objective:   Physical Exam  Constitutional: He is oriented to person, place, and time. He appears well-developed and well-nourished.  HENT:  Head: Normocephalic and atraumatic.  Eyes: Conjunctivae and EOM are normal. Pupils are equal, round, and reactive to light.  Neck: Normal range of motion. Neck supple.  Cardiovascular: Normal rate, regular rhythm and intact distal pulses.   Pulmonary/Chest: Effort normal.  Abdominal: Soft.  Musculoskeletal: He exhibits tenderness (Pain right medial hamstring distal third.  No redness, no bruising, no defect felt.  ROM of knee full but tender.  Left leg negative.  No limp.).  Neurological: He is alert and oriented to person, place, and time. He has normal reflexes. He displays normal reflexes. No cranial nerve deficit. He exhibits normal muscle tone. Coordination normal.  Skin: Skin is warm and dry.  Psychiatric: He has a normal mood and affect. His behavior is normal. Judgment and thought content normal.          Assessment & Plan:   Encounter Diagnosis  Name Primary?  . Hamstring injury, left, initial encounter Yes   I have told him this will take another two to three weeks to resolve.  A MRI could be done if pain persists.  I will see him in two weeks.  Use ice.  Hold off on golf.  He can walk  for exercises if he wants.  Call if any problem.  Precautions discussed.  Electronically Signed Darreld McleanWayne Laurice Kimmons, MD 8/22/20173:42 PM

## 2015-10-01 ENCOUNTER — Encounter: Payer: Self-pay | Admitting: Orthopaedic Surgery

## 2015-10-01 ENCOUNTER — Ambulatory Visit: Payer: Medicare Other | Admitting: Orthopaedic Surgery

## 2015-11-29 ENCOUNTER — Other Ambulatory Visit (HOSPITAL_COMMUNITY): Payer: Self-pay | Admitting: Pulmonary Disease

## 2015-11-29 DIAGNOSIS — R599 Enlarged lymph nodes, unspecified: Secondary | ICD-10-CM

## 2015-12-05 ENCOUNTER — Ambulatory Visit (HOSPITAL_COMMUNITY)
Admission: RE | Admit: 2015-12-05 | Discharge: 2015-12-05 | Disposition: A | Discharge status: Home / Self Care | Payer: Medicare Other | Source: Ambulatory Visit | Attending: Pulmonary Disease | Admitting: Pulmonary Disease

## 2015-12-05 DIAGNOSIS — I6523 Occlusion and stenosis of bilateral carotid arteries: Secondary | ICD-10-CM | POA: Insufficient documentation

## 2015-12-05 DIAGNOSIS — R599 Enlarged lymph nodes, unspecified: Secondary | ICD-10-CM

## 2015-12-05 DIAGNOSIS — R591 Generalized enlarged lymph nodes: Secondary | ICD-10-CM | POA: Insufficient documentation

## 2015-12-05 LAB — POCT I-STAT CREATININE: Creatinine, Ser: 1 mg/dL (ref 0.61–1.24)

## 2015-12-05 MED ORDER — IOPAMIDOL (ISOVUE-300) INJECTION 61%
75.0000 mL | Freq: Once | INTRAVENOUS | Status: AC | PRN
  Administered 2015-12-05: 75 mL via INTRAVENOUS

## 2015-12-31 DIAGNOSIS — R221 Localized swelling, mass and lump, neck: Secondary | ICD-10-CM | POA: Insufficient documentation

## 2016-03-16 ENCOUNTER — Ambulatory Visit (INDEPENDENT_AMBULATORY_CARE_PROVIDER_SITE_OTHER): Payer: 59 | Admitting: Psychology

## 2016-03-16 DIAGNOSIS — F4321 Adjustment disorder with depressed mood: Secondary | ICD-10-CM | POA: Diagnosis not present

## 2016-03-19 ENCOUNTER — Other Ambulatory Visit: Payer: Self-pay | Admitting: Cardiology

## 2016-04-13 ENCOUNTER — Other Ambulatory Visit (HOSPITAL_COMMUNITY): Payer: Self-pay | Admitting: Pulmonary Disease

## 2016-04-13 DIAGNOSIS — I10 Essential (primary) hypertension: Secondary | ICD-10-CM

## 2016-04-16 ENCOUNTER — Ambulatory Visit (INDEPENDENT_AMBULATORY_CARE_PROVIDER_SITE_OTHER): Payer: 59 | Admitting: Psychology

## 2016-04-16 DIAGNOSIS — F4321 Adjustment disorder with depressed mood: Secondary | ICD-10-CM | POA: Diagnosis not present

## 2016-04-22 ENCOUNTER — Ambulatory Visit (HOSPITAL_COMMUNITY)
Admission: RE | Admit: 2016-04-22 | Discharge: 2016-04-22 | Disposition: A | Discharge status: Home / Self Care | Payer: Medicare Other | Source: Ambulatory Visit | Attending: Pulmonary Disease | Admitting: Pulmonary Disease

## 2016-04-22 DIAGNOSIS — I10 Essential (primary) hypertension: Secondary | ICD-10-CM | POA: Insufficient documentation

## 2016-05-18 ENCOUNTER — Ambulatory Visit: Payer: 59 | Admitting: Psychology

## 2016-06-01 ENCOUNTER — Ambulatory Visit (INDEPENDENT_AMBULATORY_CARE_PROVIDER_SITE_OTHER): Payer: 59 | Admitting: Psychology

## 2016-06-01 DIAGNOSIS — F4321 Adjustment disorder with depressed mood: Secondary | ICD-10-CM

## 2016-07-08 ENCOUNTER — Ambulatory Visit (INDEPENDENT_AMBULATORY_CARE_PROVIDER_SITE_OTHER): Payer: 59 | Admitting: Psychology

## 2016-07-08 DIAGNOSIS — F4321 Adjustment disorder with depressed mood: Secondary | ICD-10-CM | POA: Diagnosis not present

## 2016-07-10 ENCOUNTER — Other Ambulatory Visit: Payer: Self-pay | Admitting: Cardiology

## 2016-08-07 ENCOUNTER — Ambulatory Visit (INDEPENDENT_AMBULATORY_CARE_PROVIDER_SITE_OTHER): Payer: 59 | Admitting: Psychology

## 2016-08-07 DIAGNOSIS — F4321 Adjustment disorder with depressed mood: Secondary | ICD-10-CM

## 2016-08-11 ENCOUNTER — Ambulatory Visit: Payer: Self-pay | Admitting: Cardiology

## 2016-08-17 ENCOUNTER — Encounter: Payer: Self-pay | Admitting: Cardiology

## 2016-08-17 ENCOUNTER — Ambulatory Visit (INDEPENDENT_AMBULATORY_CARE_PROVIDER_SITE_OTHER): Payer: Medicare Other | Admitting: Cardiology

## 2016-08-17 VITALS — BP 152/97 | HR 68 | Ht 70.0 in | Wt 204.0 lb

## 2016-08-17 DIAGNOSIS — I10 Essential (primary) hypertension: Secondary | ICD-10-CM | POA: Diagnosis not present

## 2016-08-17 DIAGNOSIS — I2510 Atherosclerotic heart disease of native coronary artery without angina pectoris: Secondary | ICD-10-CM | POA: Diagnosis not present

## 2016-08-17 DIAGNOSIS — E782 Mixed hyperlipidemia: Secondary | ICD-10-CM | POA: Diagnosis not present

## 2016-08-17 DIAGNOSIS — I519 Heart disease, unspecified: Secondary | ICD-10-CM | POA: Diagnosis not present

## 2016-08-17 DIAGNOSIS — I5189 Other ill-defined heart diseases: Secondary | ICD-10-CM

## 2016-08-17 MED ORDER — CHLORTHALIDONE 25 MG PO TABS: 12.5000 mg | ORAL_TABLET | Freq: Every day | ORAL | 3 refills | Status: DC

## 2016-08-17 NOTE — Patient Instructions (Signed)
Medication Instructions:  START CHLORTHALIDONE 12.5 MG DAILY ( 1/2 TABLET)   Labwork: 2 WEEKS  BMET MAGNESIUM      I WILL REQUEST A COPY OF LABS FROM DR. HAWKINS   Testing/Procedures: NONE  Follow-Up: Your physician wants you to follow-up in: 1 YEAR WITH DR. BRANCH.  You will receive a reminder letter in the mail two months in advance. If you don't receive a letter, please call our office to schedule the follow-up appointment.   Any Other Special Instructions Will Be Listed Below (If Applicable). PLEASE KEEP A BLOOD PRESSURE LOG FOR 2 WEEKS AND DROP IT OFF BY THE OFFICE FOR DR. BRANCH TO REVIEW.    If you need a refill on your cardiac medications before your next appointment, please call your pharmacy.

## 2016-08-17 NOTE — Progress Notes (Signed)
Clinical Summary Mr. Austin Austin Bright is a 71 y.o.male seen today for follow up of the following medical problems.   1. CAD - Prior anterior wall MI in 08/2011, received DES. LVEF in setting of MI 35% - echo 11/2011 LVEF 50-55%, grade Austin Bright diastolic dysfunction.   - no chest pain, no SOB or DOE since last visit.  - compliant with meds  2. HTN -does  not check regularly at home - he remains compliant with meds - reports recent elevations in his home blood pressures, SBP up to 150s to 160s  3. Hyperlipidemia - compliant with crestor -he states he has upcoming labs with pcp.   4. Diastolic dysfunction - grade Austin Bright diastolic dysfunction by most recent echo 11/2011  - only takes lasix prn as needed. Denies any recent SOB/DOE, no LE edema  5. Former tobacco - 2012 AAA US without aneurysm    SH: wife recently passed May 2017, laryngeal cancer. . Works as Catering managerfire investigator   Past Medical History:  Diagnosis Date  . Acute MI   . Arthritis   . Bilateral pneumonia    Diagnosed after STEMI 08/2011  . CAD (coronary artery disease)    a. Diagnosed 08/2011 with anterior STEMI due to thrombotic occlusion of mid LAD s/p thrombectomy, PTCA, DES placement 08/23/11.  Marland Kitchen. CHF (congestive heart failure) (HCC)   . Dyslipidemia   . Gout   . Hypertension   . Ischemic cardiomyopathy    a. Initial EF 35% by cath 08/23/11, improved to 40-45% by echo 08/25/11.  Marland Kitchen. Shortness of breath      No Known Allergies   Current Outpatient Prescriptions  Medication Sig Dispense Refill  . allopurinol (ZYLOPRIM) 300 MG tablet Take 300 mg by mouth daily.    Austin Austin Bright. Alpha Lipoic Acid 200 MG CAPS Take 1 capsule by mouth daily.    Marland Kitchen. aspirin EC 81 MG tablet Take 81 mg by mouth daily.    . carvedilol (COREG) 3.125 MG tablet Take 3.125 mg by mouth 2 (two) times daily with a meal.    . CINNAMON PO Take 1 capsule by mouth daily.    . Coenzyme Q10 (CO Q-10) 100 MG CAPS Take 1 capsule by mouth daily.    . furosemide  (LASIX) 20 MG tablet TAKE ONE TABLET BY MOUTH DAILY AS NEEDED. (TAKE IF WEIGHT INCREASES BY 2 LBS.) 30 tablet 3  . Green Tea 150 MG CAPS Take 1 capsule by mouth daily.    Marland Kitchen. lisinopril (PRINIVIL,ZESTRIL) 10 MG tablet TAKE ONE TABLET BY MOUTH ONCE DAILY. 90 tablet 3  . nitroGLYCERIN (NITROSTAT) 0.4 MG SL tablet PLACE 1 TAB UNDER TONGUE EVERY 5 MIN IF NEEDED FOR CHEST PAIN. MAY USE 3 TIMES.NO RELIEF CALL 911. 25 tablet 3  . RESVERATROL 100 MG CAPS Take 1 capsule by mouth daily.    . rosuvastatin (CRESTOR) 40 MG tablet TAKE ONE TABLET BY MOUTH ONCE DAILY. 90 tablet 3  . sildenafil (VIAGRA) 100 MG tablet TAKE 50 MG (1/2 TABLET) 30 MINUTES BEFORE SEXUAL ACTIVITY 4 tablet 11  . Turmeric 500 MG CAPS Take 1 capsule by mouth daily.    . vitamin C (ASCORBIC ACID) 500 MG tablet Take 500 mg by mouth daily.     No current facility-administered medications for this visit.      Past Surgical History:  Procedure Laterality Date  . carpel tunnel  2002  . CORONARY STENT PLACEMENT    . CYSTECTOMY  1969  . LEFT AND RIGHT  HEART CATHETERIZATION WITH CORONARY ANGIOGRAM N/A 09/15/2011   Procedure: LEFT AND RIGHT HEART CATHETERIZATION WITH CORONARY ANGIOGRAM;  Surgeon: Herby Abraham, MD;  Location: [facility] C[facility] LAB;  Service: Cardiovascular;  Laterality: N/A;  . LEFT HEART CATH N/A 08/23/2011   Procedure: LEFT HEART CATH;  Surgeon: Iran Ouch, MD;  Location: [facility] CATH LAB;  Service: Cardiovascular;  Laterality: N/A;  . NO PAST SURGERIES    . PERCUTANEOUS CORONARY STENT INTERVENTION (PCI-S)  08/23/2011   Procedure: PERCUTANEOUS CORONARY STENT INTERVENTION (PCI-S);  Surgeon: Iran Ouch, MD;  Location: [facility] CATH LAB;  Service: Cardiovascular;;     No Known Allergies    No family history on file.   Social History Mr. Austin Austin Bright reports that he quit smoking about 37 years ago. He has quit using smokeless tobacco. Mr. Austin Austin Bright reports that he drinks about 2.4 oz of alcohol per week .   Review of  Systems CONSTITUTIONAL: No weight loss, fever, chills, weakness or fatigue.  HEENT: Eyes: No visual loss, blurred vision, double vision or yellow sclerae.No hearing loss, sneezing, congestion, runny nose or sore throat.  SKIN: No rash or itching.  CARDIOVASCULAR: per hpi RESPIRATORY: No shortness of breath, cough or sputum.  GASTROINTESTINAL: No anorexia, nausea, vomiting or diarrhea. No abdominal pain or blood.  GENITOURINARY: No burning on urination, no polyuria NEUROLOGICAL: No headache, dizziness, syncope, paralysis, ataxia, numbness or tingling in the extremities. No change in bowel or bladder control.  MUSCULOSKELETAL: No muscle, back pain, joint pain or stiffness.  LYMPHATICS: No enlarged nodes. No history of splenectomy.  PSYCHIATRIC: No history of depression or anxiety.  ENDOCRINOLOGIC: No reports of sweating, cold or heat intolerance. No polyuria or polydipsia.  Marland Kitchen   Physical Examination Vitals:   08/17/16 1430  BP: (!) 152/97  Pulse: 68   Vitals:   08/17/16 1430  Weight: 204 lb (92.5 kg)  Height: 5\' 10"  (1.778 m)    Gen: resting comfortably, no acute distress HEENT: no scleral icterus, pupils equal round and reactive, no palptable cervical adenopathy,  CV: RRR, no mr/g, no jvd Resp: Clear to auscultation bilaterally GI: abdomen is soft, non-tender, non-distended, normal bowel sounds, no hepatosplenomegaly MSK: extremities are warm, no edema.  Skin: warm, no rash Neuro:  no focal deficits Psych: appropriate affect   Diagnostic Studies 11/2011 Echo Study Conclusions  - Left ventricle: The cavity size was normal. Wall thickness was normal. Systolic function was normal. The estimated ejection fraction was in the range of 50% to 55%. There is hypokinesis of the mid-distalinferoseptal myocardium. There is hypokinesis of the distalanteroseptal myocardium. Features are consistent with a pseudonormal left ventricular filling pattern, with concomitant  abnormal relaxation and increased filling pressure (grade 2 diastolic dysfunction). Doppler parameters are consistent with elevated ventricular end-diastolic filling pressure. - Aortic valve: Mildly calcified annulus. Trileaflet; mildly calcified leaflets. - Mitral valve: Trivial regurgitation. - Left atrium: The atrium was mildly dilated. - Right ventricle: The cavity size was mildly dilated. - Tricuspid valve: Trivial regurgitation. - Pulmonary arteries: PA peak pressure: 35mm Hg (S). - Pericardium, extracardiac: There was no pericardial effusion.     Assessment and Plan  1. CAD - no current symptoms. Continue current meds  2. HTN - recent issues with elevated bp's - we will start chlorthalinde 12.5mg  daily, check BMET in 2 weeks. He will reports bp's in 2 weeks.   3. Hyperlipidemia - continue high dose statin - request labs from pcp  4. Diastolic dysfunction - no recent issues, continue to  monitor     F/u 1 year. May need further titration of bp meds after he reports his bp's in 2 weeks      Antoine PocheJonathan F. Branch, M.D.

## 2016-09-02 ENCOUNTER — Other Ambulatory Visit: Payer: Self-pay | Admitting: Cardiology

## 2016-09-02 LAB — BASIC METABOLIC PANEL
BUN: 22 mg/dL (ref 7–25)
CALCIUM: 9.3 mg/dL (ref 8.6–10.3)
CO2: 28 mmol/L (ref 20–32)
Chloride: 101 mmol/L (ref 98–110)
Creat: 0.97 mg/dL (ref 0.70–1.25)
Glucose, Bld: 94 mg/dL (ref 65–99)
Potassium: 3.9 mmol/L (ref 3.5–5.3)
SODIUM: 140 mmol/L (ref 135–146)

## 2016-09-02 LAB — MAGNESIUM: MAGNESIUM: 2.1 mg/dL (ref 1.5–2.5)

## 2016-09-08 ENCOUNTER — Ambulatory Visit (INDEPENDENT_AMBULATORY_CARE_PROVIDER_SITE_OTHER): Payer: Medicare Other | Admitting: Psychology

## 2016-09-08 DIAGNOSIS — F4321 Adjustment disorder with depressed mood: Secondary | ICD-10-CM | POA: Diagnosis not present

## 2016-09-11 ENCOUNTER — Other Ambulatory Visit: Payer: Self-pay | Admitting: Cardiology

## 2016-10-06 ENCOUNTER — Ambulatory Visit (INDEPENDENT_AMBULATORY_CARE_PROVIDER_SITE_OTHER): Payer: Medicare Other | Admitting: Psychology

## 2016-10-06 DIAGNOSIS — F4321 Adjustment disorder with depressed mood: Secondary | ICD-10-CM

## 2016-11-10 ENCOUNTER — Ambulatory Visit: Payer: Medicare Other | Admitting: Psychology

## 2016-11-24 ENCOUNTER — Encounter: Payer: Self-pay | Admitting: General Practice

## 2016-12-15 ENCOUNTER — Telehealth: Payer: Self-pay

## 2016-12-15 NOTE — Telephone Encounter (Signed)
Pt said to call his cell number instead. [phone]

## 2016-12-15 NOTE — Telephone Encounter (Signed)
Pt said he received a triage letter to call DS. No GI problems, no blood thinners, no heart attacks. Please call [phone]

## 2016-12-15 NOTE — Telephone Encounter (Signed)
Noted  

## 2016-12-15 NOTE — Telephone Encounter (Signed)
See additional note: call [phone] instead.

## 2016-12-17 ENCOUNTER — Ambulatory Visit: Payer: Medicare Other | Admitting: Psychology

## 2016-12-24 NOTE — Telephone Encounter (Signed)
LMOM to call.

## 2017-01-04 ENCOUNTER — Telehealth: Payer: Self-pay

## 2017-01-04 NOTE — Telephone Encounter (Signed)
Gastroenterology Pre-Procedure Review  Request Date: 01/04/2017 Requesting Physician: Dr. Juanetta GoslingHawkins  PATIENT REVIEW QUESTIONS: The patient responded to the following health history questions as indicated:    Pt said his last colonoscopy was over 15 years ago by Dr. Karilyn Cotaehman  1. Diabetes Melitis: no 2. Joint replacements in the past 12 months: no 3. Major health problems in the past 3 months: no 4. Has an artificial valve or MVP: no 5. Has a defibrillator: no 6. Has been advised in past to take antibiotics in advance of a procedure like teeth cleaning: no 7. Family history of colon cancer: No 8. Alcohol Use:  YES   PT drinks some about 3 times a week/ either 2 oz of whiskey or 3 beers at the time 9. History of sleep apnea: no  10. History of coronary artery or other vascular stents placed within the last 12 months: no 11. History of any prior anesthesia complications: no    MEDICATIONS & ALLERGIES:    Patient reports the following regarding taking any blood thinners:   Plavix? no Aspirin? YES Coumadin? no Brilinta? no Xarelto? no Eliquis? no Pradaxa? no Savaysa? no Effient? no  Patient confirms/reports the following medications:  Current Outpatient Medications  Medication Sig Dispense Refill  . allopurinol (ZYLOPRIM) 300 MG tablet Take 300 mg by mouth daily.    Allen Kell. Alpha Lipoic Acid 200 MG CAPS Take 1 capsule by mouth daily.    Marland Kitchen. aspirin EC 81 MG tablet Take 81 mg by mouth daily.    . carvedilol (COREG) 25 MG tablet Take 25 mg by mouth 2 (two) times daily with a meal.    . chlorthalidone (HYGROTON) 25 MG tablet Take 25 mg by mouth daily. Takes one half tablet daily    . CINNAMON PO Take 1 capsule by mouth daily.    . Coenzyme Q10 (CO Q-10) 100 MG CAPS Take 1 capsule by mouth daily.    . furosemide (LASIX) 20 MG tablet TAKE ONE TABLET BY MOUTH DAILY AS NEEDED. (TAKE IF WEIGHT INCREASES BY 2 LBS.) 30 tablet 3  . Green Tea 150 MG CAPS Take 1 capsule by mouth daily.    Marland Kitchen. lisinopril  (PRINIVIL,ZESTRIL) 40 MG tablet Take 40 mg by mouth daily.    Marland Kitchen. RESVERATROL 100 MG CAPS Take 1 capsule by mouth daily.    . rosuvastatin (CRESTOR) 40 MG tablet TAKE ONE TABLET BY MOUTH ONCE DAILY. 90 tablet 3  . Turmeric 500 MG CAPS Take 1 capsule by mouth daily.    . vitamin C (ASCORBIC ACID) 500 MG tablet Take 500 mg by mouth daily.    . nitroGLYCERIN (NITROSTAT) 0.4 MG SL tablet PLACE 1 TAB UNDER TONGUE EVERY 5 MIN IF NEEDED FOR CHEST PAIN. MAY USE 3 TIMES.NO RELIEF CALL 911. (Patient not taking: Reported on 01/04/2017) 25 tablet 0  . sildenafil (VIAGRA) 100 MG tablet TAKE 1/2 TABLET BY MOUTH 30 MINUTES BEFORE SEXUAL ACTIVITY. 4 tablet 0   No current facility-administered medications for this visit.     Patient confirms/reports the following allergies:  No Known Allergies  No orders of the defined types were placed in this encounter.   AUTHORIZATION INFORMATION Primary Insurance:   ID #:  Group #:  Pre-Cert / Auth required:  Pre-Cert / Auth #:   Secondary Insurance:   ID #: ,  Group #:  Pre-Cert / Auth required:  Pre-Cert / Auth #:   SCHEDULE INFORMATION: Procedure has been scheduled as follows:  Date:  Time:  Location:   This Gastroenterology Pre-Precedure Review Form is being routed to the following provider(s): Jonette EvaSandi Fields, MD

## 2017-01-04 NOTE — Telephone Encounter (Signed)
See triage

## 2017-01-06 NOTE — Telephone Encounter (Signed)
WIth alcohol use may be best served with Propofol

## 2017-01-11 NOTE — Telephone Encounter (Signed)
Pt is scheduled for an OV with Wynne DustEric Gill, NP on 03/09/2017 at 8:30 Am.

## 2017-01-21 NOTE — Telephone Encounter (Addendum)
Pt called and said he stopped drinking alcohol on New Year's Day.  He is scheduled for an OV on 03/09/2017.   He would like to know how long he would need to abstain in order to avoid an OV and be scheduled in regular ENDO for the colonoscopy.  Please advise!

## 2017-01-22 NOTE — Telephone Encounter (Signed)
It's great he has stopped, but the chronic use is what made the decision to use Propofol.

## 2017-01-25 NOTE — Telephone Encounter (Signed)
Pt is aware to keep appt on 03/09/2017.

## 2017-01-26 ENCOUNTER — Ambulatory Visit: Payer: Medicare Other | Admitting: Psychology

## 2017-01-26 DIAGNOSIS — F4321 Adjustment disorder with depressed mood: Secondary | ICD-10-CM | POA: Diagnosis not present

## 2017-03-02 ENCOUNTER — Ambulatory Visit: Payer: Medicare Other | Admitting: Psychology

## 2017-03-09 ENCOUNTER — Encounter: Payer: Self-pay | Admitting: Nurse Practitioner

## 2017-03-09 ENCOUNTER — Telehealth: Payer: Self-pay

## 2017-03-09 ENCOUNTER — Ambulatory Visit: Payer: Medicare Other | Admitting: Nurse Practitioner

## 2017-03-09 ENCOUNTER — Other Ambulatory Visit: Payer: Self-pay

## 2017-03-09 DIAGNOSIS — Z9889 Other specified postprocedural states: Secondary | ICD-10-CM

## 2017-03-09 DIAGNOSIS — Z1211 Encounter for screening for malignant neoplasm of colon: Secondary | ICD-10-CM

## 2017-03-09 MED ORDER — PEG 3350-KCL-NA BICARB-NACL 420 G PO SOLR: 4000.0000 mL | ORAL | 0 refills | Status: DC

## 2017-03-09 NOTE — Telephone Encounter (Signed)
Called and informed pt of pre-op appt 04/02/17 at 9:00am. Letter mailed.

## 2017-03-09 NOTE — Progress Notes (Signed)
Referring Provider: Kari Baars, MD Primary Care Physician:  Kari Baars, MD Primary GI:  Dr. Jena Gauss  Chief Complaint  Patient presents with  . Colonoscopy    consult    HPI:   Austin Bright is a 72 y.o. male who presents referral from primary care for colonoscopy.  His phone triage was deferred to office visit due to possible increased risk in need of augmented sedation given chronic alcohol use.  Per the patient his last colonoscopy was over 15 years ago with Dr. Karilyn Cota.  This report is not available in our system.  PCP note dated 10/29/2016 was reviewed.  Apparently the patient had an MI 3 years prior.  He does not appear to be on any anticoagulants other than daily aspirin 81 mg.  Today he states he's doing well. Last colonoscopy was just after age 88, found 1 hemorrhoid (per the patient). Denies abdominal pain, N/V, hematochezia, melena, fever, chills, unintentional weight loss, sudden changes in bowel habits. Denies chest pain, dyspnea, dizziness, lightheadedness, syncope, near syncope. Denies any other upper or lower GI symptoms.  MI August 2013. Denies anticoagulation other than ASA 81 mg.  Past Medical History:  Diagnosis Date  . Acute MI (HCC)   . Arthritis   . Bilateral pneumonia    Diagnosed after STEMI 08/2011  . CAD (coronary artery disease)    a. Diagnosed 08/2011 with anterior STEMI due to thrombotic occlusion of mid LAD s/p thrombectomy, PTCA, DES placement 08/23/11.  Marland Kitchen CHF (congestive heart failure) (HCC)   . Dyslipidemia   . Gout   . Hypertension   . Ischemic cardiomyopathy    a. Initial EF 35% by cath 08/23/11, improved to 40-45% by echo 08/25/11.  Marland Kitchen Shortness of breath     Past Surgical History:  Procedure Laterality Date  . carpel tunnel  2002  . CORONARY STENT PLACEMENT    . CYSTECTOMY  1969  . LEFT AND RIGHT HEART CATHETERIZATION WITH CORONARY ANGIOGRAM N/A 09/15/2011   Procedure: LEFT AND RIGHT HEART CATHETERIZATION WITH CORONARY ANGIOGRAM;   Surgeon: Herby Abraham, MD;  [facility] C[facility] LAB;  Service: Cardiovascular;  Laterality: N/A;  . LEFT HEART CATH N/A 08/23/2011   Procedure: LEFT HEART CATH;  Surgeon: Iran Ouch, MD;  Location: [facility] CATH LAB;  Service: Cardiovascular;  Laterality: N/A;  . NO PAST SURGERIES    . PERCUTANEOUS CORONARY STENT INTERVENTION (PCI-S)  08/23/2011   Procedure: PERCUTANEOUS CORONARY STENT INTERVENTION (PCI-S);  Surgeon: Iran Ouch, MD;  Location: [facility] CATH LAB;  Service: Cardiovascular;;    Current Outpatient Medications  Medication Sig Dispense Refill  . allopurinol (ZYLOPRIM) 300 MG tablet Take 300 mg by mouth daily.    Allen Kell Lipoic Acid 200 MG CAPS Take 1 capsule by mouth daily.    Marland Kitchen aspirin EC 81 MG tablet Take 81 mg by mouth daily.    . carvedilol (COREG) 25 MG tablet Take 12.5 mg by mouth 2 (two) times daily with a meal.     . chlorthalidone (HYGROTON) 25 MG tablet Take 25 mg by mouth daily. Takes one half tablet daily    . CINNAMON PO Take 1 capsule by mouth daily.    . Coenzyme Q10 (CO Q-10) 100 MG CAPS Take 1 capsule by mouth daily.    . furosemide (LASIX) 20 MG tablet TAKE ONE TABLET BY MOUTH DAILY AS NEEDED. (TAKE IF WEIGHT INCREASES BY 2 LBS.) 30 tablet 3  . Green Tea 150 MG CAPS Take  1 capsule by mouth daily.    Marland Kitchen lisinopril (PRINIVIL,ZESTRIL) 40 MG tablet Take 40 mg by mouth daily.    . nitroGLYCERIN (NITROSTAT) 0.4 MG SL tablet PLACE 1 TAB UNDER TONGUE EVERY 5 MIN IF NEEDED FOR CHEST PAIN. MAY USE 3 TIMES.NO RELIEF CALL 911. 25 tablet 0  . RESVERATROL 100 MG CAPS Take 1 capsule by mouth daily.    . rosuvastatin (CRESTOR) 40 MG tablet TAKE ONE TABLET BY MOUTH ONCE DAILY. 90 tablet 3  . sildenafil (VIAGRA) 100 MG tablet TAKE 1/2 TABLET BY MOUTH 30 MINUTES BEFORE SEXUAL ACTIVITY. 4 tablet 0  . Turmeric 500 MG CAPS Take 1 capsule by mouth daily.    . vitamin C (ASCORBIC ACID) 500 MG tablet Take 500 mg by mouth daily.     No current facility-administered medications for this  visit.     Allergies as of 03/09/2017  . (No Known Allergies)    Family History  Problem Relation Age of Onset  . Colon cancer Neg Hx     Social History   Socioeconomic History  . Marital status: Widowed    Spouse name: None  . Number of children: None  . Years of education: None  . Highest education level: None  Social Needs  . Financial resource strain: None  . Food insecurity - worry: None  . Food insecurity - inability: None  . Transportation needs - medical: None  . Transportation needs - non-medical: None  Occupational History  . Occupation: Child psychotherapist  Tobacco Use  . Smoking status: Former Smoker    Last attempt to quit: 08/23/1979    Years since quitting: 37.5  . Smokeless tobacco: Former Engineer, water and Sexual Activity  . Alcohol use: Yes    Alcohol/week: 2.4 oz    Types: 2 Cans of beer, 2 Shots of liquor per week    Comment: Weekends up to 3 times a week; 2-3 drinks per sitting.  . Drug use: No  . Sexual activity: Yes  Other Topics Concern  . None  Social History Narrative   Pt lives in [place], is out of work till [employer] 2013 after MI. He is an only child.    Review of Systems: General: Negative for anorexia, weight loss, fever, chills, fatigue, weakness. ENT: Negative for hoarseness, difficulty swallowing. CV: Negative for chest pain, angina, palpitations, peripheral edema.  Respiratory: Negative for dyspnea at rest, cough, sputum, wheezing.  GI: See history of present illness. MS: Negative for joint pain, low back pain.  Derm: Negative for rash or itching.  Endo: Negative for unusual weight change.  Heme: Negative for bruising or bleeding. Allergy: Negative for rash or hives.   Physical Exam: BP 123/83   Pulse 67   Temp 99 F (37.2 C) (Oral)   Ht 5\' 10"  (1.778 m)   Wt 215 lb 3.2 oz (97.6 kg)   BMI 30.88 kg/m  General:   Alert and oriented. Pleasant and cooperative. Well-nourished and well-developed.  Eyes:  Without icterus,  sclera clear and conjunctiva pink.  Ears:  Normal auditory acuity. Cardiovascular:  S1, S2 present without murmurs appreciated. Extremities without clubbing or edema. Respiratory:  Clear to auscultation bilaterally. No wheezes, rales, or rhonchi. No distress.  Gastrointestinal:  +BS, soft, non-tender and non-distended. No HSM noted. No guarding or rebound. No masses appreciated.  Rectal:  Deferred  Musculoskalatal:  Symmetrical without gross deformities. Neurologic:  Alert and oriented x4;  grossly normal neurologically. Psych:  Alert and cooperative. Normal mood and  affect. Heme/Lymph/Immune: No excessive bruising noted.    03/09/2017 8:44 AM   Disclaimer: This note was dictated with voice recognition software. Similar sounding words can inadvertently be transcribed and may not be corrected upon review.

## 2017-03-09 NOTE — Assessment & Plan Note (Signed)
His colonoscopy approximately 15 years ago.  He is currently overdue.  He was brought into the office due to chronic alcohol use.  However, it does not sound like he abuses alcohol.  He has typically 2-3 drinks per sitting, 2-3 times a week.  Last colonoscopy was normal, per the patient.  He was brought into the office due to likely need for augmented sedation.  At this time we will proceed with colonoscopy on propofol/MAC.  He is generally asymptomatic from a GI standpoint.  Proceed with TCS on propofol/MAC with Dr. Gala Romney in near future: the risks, benefits, and alternatives have been discussed with the patient in detail. The patient states understanding and desires to proceed.  Patient is not on any anticoagulants, anxiolytics, chronic pain medications, or antidepressants.  He does drink alcohol as per above.  No recreational drugs.  We will plan for the procedure on propofol/MAC to promote adequate sedation.

## 2017-03-09 NOTE — Progress Notes (Signed)
cc'd to pcp 

## 2017-03-09 NOTE — Patient Instructions (Signed)
1. We will schedule your procedure for you. 2. Call if you have any questions or concerns

## 2017-03-19 LAB — HM COLONOSCOPY

## 2017-03-29 NOTE — Patient Instructions (Signed)
Austin CoeJerry D Bigbee II  03/29/2017     @PREFPERIOPPHARMACY @   Your procedure is scheduled on  04/08/2017 .  Report to [facility] at  930   A.M.  Call this number if you have problems the morning of surgery:  [phone]   Remember:  Do not eat food or drink liquids after midnight.  Take these medicines the morning of surgery with A SIP OF WATER  Allopurinol, coreg, lisinopril.   Do not wear jewelry, make-up or nail polish.  Do not wear lotions, powders, or perfumes, or deodorant.  Do not shave 48 hours prior to surgery.  Men may shave face and neck.  Do not bring valuables to the hospital.  [facility] not [facility] any belongings or valuables.  Contacts, dentures or bridgework may not be worn into surgery.  Leave your suitcase in the car.  After surgery it may be brought to your room.  For patients admitted to the hospital, discharge time will be determined by your treatment team.  Patients discharged the day of surgery will not be allowed to drive home.   Name and phone number of your driver:   family Special instructions:  Follow the diet and prep instructions given to you by Dr Charna Busmanoruk's office.  Please read over the following fact sheets that you were given. Anesthesia Post-op Instructions and Care and Recovery After Surgery       Colonoscopy, Adult A colonoscopy is an exam to look at the large intestine. It is done to check for problems, such as:  Lumps (tumors).  Growths (polyps).  Swelling (inflammation).  Bleeding.  What happens before the procedure? Eating and drinking Follow instructions from your doctor about eating and drinking. These instructions may include:  A few days before the procedure - follow a low-fiber diet. ? Avoid nuts. ? Avoid seeds. ? Avoid dried fruit. ? Avoid raw fruits. ? Avoid vegetables.  1-3 days before the procedure - follow a clear liquid diet. Avoid liquids that have red or purple dye. Drink only  clear liquids, such as: ? Clear broth or bouillon. ? Black coffee or tea. ? Clear juice. ? Clear soft drinks or sports drinks. ? Gelatin dessert. ? Popsicles.  On the day of the procedure - do not eat or drink anything during the 2 hours before the procedure.  Bowel prep If you were prescribed an oral bowel prep:  Take it as told by your doctor. Starting the day before your procedure, you will need to drink a lot of liquid. The liquid will cause you to poop (have bowel movements) until your poop is almost clear or light green.  If your skin or butt gets irritated from diarrhea, you may: ? Wipe the area with wipes that have medicine in them, such as adult wet wipes with aloe and vitamin E. ? Put something on your skin that soothes the area, such as petroleum jelly.  If you throw up (vomit) while drinking the bowel prep, take a break for up to 60 minutes. Then begin the bowel prep again. If you keep throwing up and you cannot take the bowel prep without throwing up, call your doctor.  General instructions  Ask your doctor about changing or stopping your normal medicines. This is important if you take diabetes medicines or blood thinners.  Plan to have someone take you home from the hospital or clinic. What happens during the procedure?  An IV  tube may be put into one of your veins.  You will be given medicine to help you relax (sedative).  To reduce your risk of infection: ? Your doctors will wash their hands. ? Your anal area will be washed with soap.  You will be asked to lie on your side with your knees bent.  Your doctor will get a long, thin, flexible tube ready. The tube will have a camera and a light on the end.  The tube will be put into your anus.  The tube will be gently put into your large intestine.  Air will be delivered into your large intestine to keep it open. You may feel some pressure or cramping.  The camera will be used to take photos.  A small tissue  sample may be removed from your body to be looked at under a microscope (biopsy). If any possible problems are found, the tissue will be sent to a lab for testing.  If small growths are found, your doctor may remove them and have them checked for cancer.  The tube that was put into your anus will be slowly removed. The procedure may vary among doctors and hospitals. What happens after the procedure?  Your doctor will check on you often until the medicines you were given have worn off.  Do not drive for 24 hours after the procedure.  You may have a small amount of blood in your poop.  You may pass gas.  You may have mild cramps or bloating in your belly (abdomen).  It is up to you to get the results of your procedure. Ask your doctor, or the department performing the procedure, when your results will be ready. This information is not intended to replace advice given to you by your health care provider. Make sure you discuss any questions you have with your health care provider. Document Released: 02/07/2010 Document Revised: 11/06/2015 Document Reviewed: 03/19/2015 Elsevier Interactive Patient Education  2017 [employer]  Colonoscopy, Adult, Care After This sheet gives you information about how to care for yourself after your procedure. Your health care provider may also give you more specific instructions. If you have problems or questions, contact your health care provider. What can I expect after the procedure? After the procedure, it is common to have:  A small amount of blood in your stool for 24 hours after the procedure.  Some gas.  Mild abdominal cramping or bloating.  Follow these instructions at home: General instructions   For the first 24 hours after the procedure: ? Do not drive or use machinery. ? Do not sign important documents. ? Do not drink alcohol. ? Do your regular daily activities at a slower pace than normal. ? Eat soft, easy-to-digest foods. ? Rest  often.  Take over-the-counter or prescription medicines only as told by your health care provider.  It is up to you to get the results of your procedure. Ask your health care provider, or the department performing the procedure, when your results will be ready. Relieving cramping and bloating  Try walking around when you have cramps or feel bloated.  Apply heat to your abdomen as told by your health care provider. Use a heat source that your health care provider recommends, such as a moist heat pack or a heating pad. ? Place a towel between your skin and the heat source. ? Leave the heat on for 20-30 minutes. ? Remove the heat if your skin turns bright red. This is especially important  if you are unable to feel pain, heat, or cold. You may have a greater risk of getting burned. Eating and drinking  Drink enough fluid to keep your urine clear or pale yellow.  Resume your normal diet as instructed by your health care provider. Avoid heavy or fried foods that are hard to digest.  Avoid drinking alcohol for as long as instructed by your health care provider. Contact a health care provider if:  You have blood in your stool 2-3 days after the procedure. Get help right away if:  You have more than a small spotting of blood in your stool.  You pass large blood clots in your stool.  Your abdomen is swollen.  You have nausea or vomiting.  You have a fever.  You have increasing abdominal pain that is not relieved with medicine. This information is not intended to replace advice given to you by your health care provider. Make sure you discuss any questions you have with your health care provider. Document Released: 08/20/2003 Document Revised: 09/30/2015 Document Reviewed: 03/19/2015 Elsevier Interactive Patient Education  2018 [place] Anesthesia is a term that refers to techniques, procedures, and medicines that help a person stay safe and comfortable  during a medical procedure. Monitored anesthesia care, or sedation, is one type of anesthesia. Your anesthesia specialist may recommend sedation if you will be having a procedure that does not require you to be unconscious, such as:  Cataract surgery.  A dental procedure.  A biopsy.  A colonoscopy.  During the procedure, you may receive a medicine to help you relax (sedative). There are three levels of sedation:  Mild sedation. At this level, you may feel awake and relaxed. You will be able to follow directions.  Moderate sedation. At this level, you will be sleepy. You may not remember the procedure.  Deep sedation. At this level, you will be asleep. You will not remember the procedure.  The more medicine you are given, the deeper your level of sedation will be. Depending on how you respond to the procedure, the anesthesia specialist may change your level of sedation or the type of anesthesia to fit your needs. An anesthesia specialist will monitor you closely during the procedure. Let your health care provider know about:  Any allergies you have.  All medicines you are taking, including vitamins, herbs, eye drops, creams, and over-the-counter medicines.  Any use of steroids (by mouth or as a cream).  Any problems you or family members have had with sedatives and anesthetic medicines.  Any blood disorders you have.  Any surgeries you have had.  Any medical conditions you have, such as sleep apnea.  Whether you are pregnant or may be pregnant.  Any use of cigarettes, alcohol, or street drugs. What are the risks? Generally, this is a safe procedure. However, problems may occur, including:  Getting too much medicine (oversedation).  Nausea.  Allergic reaction to medicines.  Trouble breathing. If this happens, a breathing tube may be used to help with breathing. It will be removed when you are awake and breathing on your own.  Heart trouble.  Lung trouble.  Before  the procedure Staying hydrated Follow instructions from your health care provider about hydration, which may include:  Up to 2 hours before the procedure - you may continue to drink clear liquids, such as water, clear fruit juice, black coffee, and plain tea.  Eating and drinking restrictions Follow instructions from your health care provider about eating  and drinking, which may include:  8 hours before the procedure - stop eating heavy meals or foods such as meat, fried foods, or fatty foods.  6 hours before the procedure - stop eating light meals or foods, such as toast or cereal.  6 hours before the procedure - stop drinking milk or drinks that contain milk.  2 hours before the procedure - stop drinking clear liquids.  Medicines Ask your health care provider about:  Changing or stopping your regular medicines. This is especially important if you are taking diabetes medicines or blood thinners.  Taking medicines such as aspirin and ibuprofen. These medicines can thin your blood. Do not take these medicines before your procedure if your health care provider instructs you not to.  Tests and exams  You will have a physical exam.  You may have blood tests done to show: ? How well your kidneys and liver are working. ? How well your blood can clot.  General instructions  Plan to have someone take you home from the hospital or clinic.  If you will be going home right after the procedure, plan to have someone with you for 24 hours.  What happens during the procedure?  Your blood pressure, heart rate, breathing, level of pain and overall condition will be monitored.  An IV tube will be inserted into one of your veins.  Your anesthesia specialist will give you medicines as needed to keep you comfortable during the procedure. This may mean changing the level of sedation.  The procedure will be performed. After the procedure  Your blood pressure, heart rate, breathing rate, and  blood oxygen level will be monitored until the medicines you were given have worn off.  Do not drive for 24 hours if you received a sedative.  You may: ? Feel sleepy, clumsy, or nauseous. ? Feel forgetful about what happened after the procedure. ? Have a sore throat if you had a breathing tube during the procedure. ? Vomit. This information is not intended to replace advice given to you by your health care provider. Make sure you discuss any questions you have with your health care provider. Document Released: 10/01/2004 Document Revised: 06/14/2015 Document Reviewed: 04/28/2015 Elsevier Interactive Patient Education  2018 [place], Care After These instructions provide you with information about caring for yourself after your procedure. Your health care provider may also give you more specific instructions. Your treatment has been planned according to current medical practices, but problems sometimes occur. Call your health care provider if you have any problems or questions after your procedure. What can I expect after the procedure? After your procedure, it is common to:  Feel sleepy for several hours.  Feel clumsy and have poor balance for several hours.  Feel forgetful about what happened after the procedure.  Have poor judgment for several hours.  Feel nauseous or vomit.  Have a sore throat if you had a breathing tube during the procedure.  Follow these instructions at home: For at least 24 hours after the procedure:   Do not: ? Participate in activities in which you could fall or become injured. ? Drive. ? Use heavy machinery. ? Drink alcohol. ? Take sleeping pills or medicines that cause drowsiness. ? Make important decisions or sign legal documents. ? Take care of children on your own.  Rest. Eating and drinking  Follow the diet that is recommended by your health care provider.  If you vomit, drink water, juice, or soup when  you  can drink without vomiting.  Make sure you have little or no nausea before eating solid foods. General instructions  Have a responsible adult stay with you until you are awake and alert.  Take over-the-counter and prescription medicines only as told by your health care provider.  If you smoke, do not smoke without supervision.  Keep all follow-up visits as told by your health care provider. This is important. Contact a health care provider if:  You keep feeling nauseous or you keep vomiting.  You feel light-headed.  You develop a rash.  You have a fever. Get help right away if:  You have trouble breathing. This information is not intended to replace advice given to you by your health care provider. Make sure you discuss any questions you have with your health care provider. Document Released: 04/28/2015 Document Revised: 08/28/2015 Document Reviewed: 04/28/2015 Elsevier Interactive Patient Education  [employer].

## 2017-04-02 ENCOUNTER — Other Ambulatory Visit: Payer: Self-pay

## 2017-04-02 ENCOUNTER — Encounter (HOSPITAL_COMMUNITY): Payer: Self-pay

## 2017-04-02 ENCOUNTER — Encounter (HOSPITAL_COMMUNITY)
Admission: RE | Admit: 2017-04-02 | Discharge: 2017-04-02 | Disposition: A | Discharge status: Home / Self Care | Payer: Medicare Other | Source: Ambulatory Visit | Attending: Internal Medicine | Admitting: Internal Medicine

## 2017-04-02 DIAGNOSIS — Z01812 Encounter for preprocedural laboratory examination: Secondary | ICD-10-CM | POA: Diagnosis present

## 2017-04-02 LAB — BASIC METABOLIC PANEL
Anion gap: 13 (ref 5–15)
BUN: 23 mg/dL — AB (ref 6–20)
CALCIUM: 9.1 mg/dL (ref 8.9–10.3)
CO2: 24 mmol/L (ref 22–32)
CREATININE: 1.08 mg/dL (ref 0.61–1.24)
Chloride: 99 mmol/L — ABNORMAL LOW (ref 101–111)
GFR calc Af Amer: >60 mL/min (ref >60)
GLUCOSE: 110 mg/dL — AB (ref 65–99)
POTASSIUM: 3.9 mmol/L (ref 3.5–5.1)
SODIUM: 136 mmol/L (ref 135–145)

## 2017-04-02 LAB — SURGICAL PCR SCREEN
MRSA, PCR: NEGATIVE
Staphylococcus aureus: NEGATIVE

## 2017-04-02 LAB — CBC
HCT: 43.1 % (ref 39.0–52.0)
Hemoglobin: 14.5 g/dL (ref 13.0–17.0)
MCH: 32 pg (ref 26.0–34.0)
MCHC: 33.6 g/dL (ref 30.0–36.0)
MCV: 95.1 fL (ref 78.0–100.0)
PLATELETS: 163 10*3/uL (ref 150–400)
RBC: 4.53 MIL/uL (ref 4.22–5.81)
RDW: 13.3 % (ref 11.5–15.5)
WBC: 7.2 10*3/uL (ref 4.0–10.5)

## 2017-04-05 ENCOUNTER — Ambulatory Visit: Payer: Medicare Other | Admitting: Psychology

## 2017-04-08 ENCOUNTER — Ambulatory Visit (HOSPITAL_COMMUNITY): Payer: Medicare Other | Admitting: Anesthesiology

## 2017-04-08 ENCOUNTER — Ambulatory Visit (HOSPITAL_COMMUNITY)
Admission: RE | Admit: 2017-04-08 | Discharge: 2017-04-08 | Disposition: A | Discharge status: Home / Self Care | Payer: Medicare Other | Source: Ambulatory Visit | Attending: Internal Medicine | Admitting: Internal Medicine

## 2017-04-08 ENCOUNTER — Encounter (HOSPITAL_COMMUNITY)
Admission: RE | Disposition: A | Discharge status: Home / Self Care | Payer: Self-pay | Source: Ambulatory Visit | Attending: Internal Medicine

## 2017-04-08 ENCOUNTER — Encounter (HOSPITAL_COMMUNITY): Payer: Self-pay | Admitting: *Deleted

## 2017-04-08 DIAGNOSIS — K5730 Diverticulosis of large intestine without perforation or abscess without bleeding: Secondary | ICD-10-CM | POA: Insufficient documentation

## 2017-04-08 DIAGNOSIS — I509 Heart failure, unspecified: Secondary | ICD-10-CM | POA: Insufficient documentation

## 2017-04-08 DIAGNOSIS — Z1212 Encounter for screening for malignant neoplasm of rectum: Secondary | ICD-10-CM | POA: Diagnosis not present

## 2017-04-08 DIAGNOSIS — Z7982 Long term (current) use of aspirin: Secondary | ICD-10-CM | POA: Diagnosis not present

## 2017-04-08 DIAGNOSIS — E785 Hyperlipidemia, unspecified: Secondary | ICD-10-CM | POA: Diagnosis not present

## 2017-04-08 DIAGNOSIS — Z87891 Personal history of nicotine dependence: Secondary | ICD-10-CM | POA: Insufficient documentation

## 2017-04-08 DIAGNOSIS — K640 First degree hemorrhoids: Secondary | ICD-10-CM | POA: Insufficient documentation

## 2017-04-08 DIAGNOSIS — I252 Old myocardial infarction: Secondary | ICD-10-CM | POA: Insufficient documentation

## 2017-04-08 DIAGNOSIS — Z79899 Other long term (current) drug therapy: Secondary | ICD-10-CM | POA: Diagnosis not present

## 2017-04-08 DIAGNOSIS — Z955 Presence of coronary angioplasty implant and graft: Secondary | ICD-10-CM | POA: Diagnosis not present

## 2017-04-08 DIAGNOSIS — I2510 Atherosclerotic heart disease of native coronary artery without angina pectoris: Secondary | ICD-10-CM | POA: Insufficient documentation

## 2017-04-08 DIAGNOSIS — Z1211 Encounter for screening for malignant neoplasm of colon: Secondary | ICD-10-CM | POA: Diagnosis present

## 2017-04-08 DIAGNOSIS — I110 Hypertensive heart disease with heart failure: Secondary | ICD-10-CM | POA: Diagnosis not present

## 2017-04-08 DIAGNOSIS — M109 Gout, unspecified: Secondary | ICD-10-CM | POA: Diagnosis not present

## 2017-04-08 HISTORY — PX: COLONOSCOPY WITH PROPOFOL: SHX5780

## 2017-04-08 SURGERY — COLONOSCOPY WITH PROPOFOL
Anesthesia: Monitor Anesthesia Care

## 2017-04-08 MED ORDER — MIDAZOLAM HCL 2 MG/2ML IJ SOLN
1.0000 mg | INTRAMUSCULAR | Status: AC
  Administered 2017-04-08: 2 mg via INTRAVENOUS

## 2017-04-08 MED ORDER — MIDAZOLAM HCL 2 MG/2ML IJ SOLN
INTRAMUSCULAR | Status: AC
  Filled 2017-04-08: qty 2

## 2017-04-08 MED ORDER — CHLORHEXIDINE GLUCONATE CLOTH 2 % EX PADS: 6.0000 | MEDICATED_PAD | Freq: Once | CUTANEOUS | Status: DC

## 2017-04-08 MED ORDER — FENTANYL CITRATE (PF) 100 MCG/2ML IJ SOLN
INTRAMUSCULAR | Status: AC
  Filled 2017-04-08: qty 2

## 2017-04-08 MED ORDER — FENTANYL CITRATE (PF) 100 MCG/2ML IJ SOLN
25.0000 ug | Freq: Once | INTRAMUSCULAR | Status: AC
  Administered 2017-04-08: 25 ug via INTRAVENOUS

## 2017-04-08 MED ORDER — LACTATED RINGERS IV SOLN
INTRAVENOUS | Status: DC
  Administered 2017-04-08: 1000 mL via INTRAVENOUS

## 2017-04-08 MED ORDER — PROPOFOL 500 MG/50ML IV EMUL
INTRAVENOUS | Status: DC | PRN
  Administered 2017-04-08: 150 ug/kg/min via INTRAVENOUS

## 2017-04-08 NOTE — Op Note (Signed)
[facility] Patient Name: Austin Bright Procedure Date: 04/08/2017 11:12 AM MRN: [id number] Date of Birth: [birth year] Attending MD: Gennette Pacobert Michael Ahmoni Edge , MD CSN: [id number] Age: 72 Admit Type: Outpatient Procedure:                Colonoscopy Indications:              Screening for colorectal malignant neoplasm Providers:                Gennette Pacobert Michael Ninette Cotta, MD, Judee ClaraPamela Shreve, RN, Dyann Ruddleonya                            Wilson Referring MD:              Medicines:                Propofol per Anesthesia Complications:            No immediate complications. Estimated Blood Loss:     Estimated blood loss: none. Procedure:                Pre-Anesthesia Assessment:                           - Prior to the procedure, a History and Physical                            was performed, and patient medications and                            allergies were reviewed. The patient's tolerance of                            previous anesthesia was also reviewed. The risks                            and benefits of the procedure and the sedation                            options and risks were discussed with the patient.                            All questions were answered, and informed consent                            was obtained. Prior Anticoagulants: The patient has                            taken no previous anticoagulant or antiplatelet                            agents. ASA Grade Assessment: II - A patient with                            mild systemic disease. After reviewing the risks  and benefits, the patient was deemed in                            satisfactory condition to undergo the procedure.                           After obtaining informed consent, the colonoscope                            was passed under direct vision. Throughout the                            procedure, the patient's blood pressure, pulse, and                            oxygen  saturations were monitored continuously. The                            EC-[id number] ([id number]) scope was introduced through                            the and advanced to the the cecum, identified by                            appendiceal orifice and ileocecal valve. The                            ileocecal valve, appendiceal orifice, and rectum                            were photographed. The patient tolerated the                            procedure well. The quality of the bowel                            preparation was adequate. Scope In: 11:39:15 AM Scope Out: 11:52:55 AM Scope Withdrawal Time: 0 hours 9 minutes 24 seconds  Total Procedure Duration: 0 hours 13 minutes 40 seconds  Findings:      The perianal and digital rectal examinations were normal.      Scattered medium-mouthed diverticula were found in the sigmoid colon and       descending colon.      The exam was otherwise without abnormality on direct and retroflexion       views.      Non-bleeding internal hemorrhoids were found during retroflexion. The       hemorrhoids were moderate, medium-sized and Grade I (internal       hemorrhoids that do not prolapse). Impression:               - Diverticulosis in the sigmoid colon and in the                            descending colon.                           -  The examination was otherwise normal on direct                            and retroflexion views.                           - Non-bleeding internal hemorrhoids.                           - No specimens collected. Moderate Sedation:      Moderate (conscious) sedation was personally administered by an       anesthesia professional. The following parameters were monitored: oxygen       saturation, heart rate, blood pressure, respiratory rate, EKG, adequacy       of pulmonary ventilation, and response to care. Total physician       intraservice time was 17 minutes. Recommendation:           - Patient has a contact number  available for                            emergencies. The signs and symptoms of potential                            delayed complications were discussed with the                            patient. Return to normal activities tomorrow.                            Written discharge instructions were provided to the                            patient.                           - Resume previous diet.                           - Continue present medications.                           - No repeat colonoscopy due to age.                           - Return to GI clinic PRN. Procedure Code(s):        --- Professional ---                           [phone], Colonoscopy, flexible; diagnostic, including                            collection of specimen(s) by brushing or washing,                            when performed (separate procedure) Diagnosis Code(s):        --- Professional ---  Z12.11, Encounter for screening for malignant                            neoplasm of colon                           K64.0, First degree hemorrhoids                           K57.30, Diverticulosis of large intestine without                            perforation or abscess without bleeding CPT copyright 2016 American [employer]. All rights reserved. The codes documented in this report are preliminary and upon coder review may  be revised to meet current compliance requirements. Gerrit Friends. Markian Glockner, MD Gennette Pac, MD 04/08/2017 11:57:57 AM This report has been signed electronically. Number of Addenda: 0

## 2017-04-08 NOTE — Discharge Instructions (Signed)
Colonoscopy Discharge Instructions  Read the instructions outlined below and refer to this sheet in the next few weeks. These discharge instructions provide you with general information on caring for yourself after you leave the hospital. Your doctor may also give you specific instructions. While your treatment has been planned according to the most current medical practices available, unavoidable complications occasionally occur. If you have any problems or questions after discharge, call Dr. Gala Romney at [phone]. ACTIVITY  You may resume your regular activity, but move at a slower pace for the next 24 hours.   Take frequent rest periods for the next 24 hours.   Walking will help get rid of the air and reduce the bloated feeling in your belly (abdomen).   No driving for 24 hours (because of the medicine (anesthesia) used during the test).    Do not sign any important legal documents or operate any machinery for 24 hours (because of the anesthesia used during the test).  NUTRITION  Drink plenty of fluids.   You may resume your normal diet as instructed by your doctor.   Begin with a light meal and progress to your normal diet. Heavy or fried foods are harder to digest and may make you feel sick to your stomach (nauseated).   Avoid alcoholic beverages for 24 hours or as instructed.  MEDICATIONS  You may resume your normal medications unless your doctor tells you otherwise.  WHAT YOU CAN EXPECT TODAY  Some feelings of bloating in the abdomen.   Passage of more gas than usual.   Spotting of blood in your stool or on the toilet paper.  IF YOU HAD POLYPS REMOVED DURING THE COLONOSCOPY:  No aspirin products for 7 days or as instructed.   No alcohol for 7 days or as instructed.   Eat a soft diet for the next 24 hours.  FINDING OUT THE RESULTS OF YOUR TEST Not all test results are available during your visit. If your test results are not back during the visit, make an appointment  with your caregiver to find out the results. Do not assume everything is normal if you have not heard from your caregiver or the medical facility. It is important for you to follow up on all of your test results.  SEEK IMMEDIATE MEDICAL ATTENTION IF:  You have more than a spotting of blood in your stool.   Your belly is swollen (abdominal distention).   You are nauseated or vomiting.   You have a temperature over 101.   You have abdominal pain or discomfort that is severe or gets worse throughout the day.    Diverticulosis Diverticulosis is a condition that develops when small pouches (diverticula) form in the wall of the large intestine (colon). The colon is where water is absorbed and stool is formed. The pouches form when the inside layer of the colon pushes through weak spots in the outer layers of the colon. You may have a few pouches or many of them. What are the causes? The cause of this condition is not known. What increases the risk? The following factors may make you more likely to develop this condition:  Being older than age 35. Your risk for this condition increases with age. Diverticulosis is rare among people younger than age 9. By age 47, many people have it.  Eating a low-fiber diet.  Having frequent constipation.  Being overweight.  Not getting enough exercise.  Smoking.  Taking over-the-counter pain medicines, like aspirin and ibuprofen.  Having  a family history of diverticulosis.  What are the signs or symptoms? In most people, there are no symptoms of this condition. If you do have symptoms, they may include:  Bloating.  Cramps in the abdomen.  Constipation or diarrhea.  Pain in the lower left side of the abdomen.  How is this diagnosed? This condition is most often diagnosed during an exam for other colon problems. Because diverticulosis usually has no symptoms, it often cannot be diagnosed independently. This condition may be diagnosed  by:  Using a flexible scope to examine the colon (colonoscopy).  Taking an X-ray of the colon after dye has been put into the colon (barium enema).  Doing a CT scan.  How is this treated? You may not need treatment for this condition if you have never developed an infection related to diverticulosis. If you have had an infection before, treatment may include:  Eating a high-fiber diet. This may include eating more fruits, vegetables, and grains.  Taking a fiber supplement.  Taking a live bacteria supplement (probiotic).  Taking medicine to relax your colon.  Taking antibiotic medicines.  Follow these instructions at home:  Drink 6-8 glasses of water or more each day to prevent constipation.  Try not to strain when you have a bowel movement.  If you have had an infection before: ? Eat more fiber as directed by your health care provider or your diet and nutrition specialist (dietitian). ? Take a fiber supplement or probiotic, if your health care provider approves.  Take over-the-counter and prescription medicines only as told by your health care provider.  If you were prescribed an antibiotic, take it as told by your health care provider. Do not stop taking the antibiotic even if you start to feel better.  Keep all follow-up visits as told by your health care provider. This is important. Contact a health care provider if:  You have pain in your abdomen.  You have bloating.  You have cramps.  You have not had a bowel movement in 3 days. Get help right away if:  Your pain gets worse.  Your bloating becomes very bad.  You have a fever or chills, and your symptoms suddenly get worse.  You vomit.  You have bowel movements that are bloody or black.  You have bleeding from your rectum. Summary  Diverticulosis is a condition that develops when small pouches (diverticula) form in the wall of the large intestine (colon).  You may have a few pouches or many of  them.  This condition is most often diagnosed during an exam for other colon problems.  If you have had an infection related to diverticulosis, treatment may include increasing the fiber in your diet, taking supplements, or taking medicines. This information is not intended to replace advice given to you by your health care provider. Make sure you discuss any questions you have with your health care provider. Document Released: 10/03/2003 Document Revised: 11/25/2015 Document Reviewed: 11/25/2015 Elsevier Interactive Patient Education  2017 [employer]   Diverticulosis information provided   I do not recommend a future colonoscopy unless new symptoms develop

## 2017-04-08 NOTE — H&P (Signed)
@LOGO @   Primary Care Physician:  Kari Baars, MD Primary Gastroenterologist:  Dr. Jena Gauss  Pre-Procedure History & Physical: HPI:  Austin Bright is a 72 y.o. male is here for a screening colonoscopy. Negative colonoscopy reportedly 15+ years ago. No family history of colon cancer. No  GI symptoms currently.  Past Medical History:  Diagnosis Date  . Acute MI (HCC) 08/2011  . Arthritis   . Bilateral pneumonia    Diagnosed after STEMI 08/2011  . CAD (coronary artery disease)    a. Diagnosed 08/2011 with anterior STEMI due to thrombotic occlusion of mid LAD s/p thrombectomy, PTCA, DES placement 08/23/11.  Marland Kitchen CHF (congestive heart failure) (HCC)   . Dyslipidemia   . Gout   . Hypertension   . Ischemic cardiomyopathy    a. Initial EF 35% by cath 08/23/11, improved to 40-45% by echo 08/25/11.  Marland Kitchen Shortness of breath     Past Surgical History:  Procedure Laterality Date  . carpel tunnel Bilateral 2002  . CORONARY STENT PLACEMENT    . CYSTECTOMY  1969   pilonidal cyst  . LEFT AND RIGHT HEART CATHETERIZATION WITH CORONARY ANGIOGRAM N/A 09/15/2011   Procedure: LEFT AND RIGHT HEART CATHETERIZATION WITH CORONARY ANGIOGRAM;  Surgeon: Herby Abraham, MD;  Location[facility] CATH LAB;  Service: Cardiovascular;  Laterality: N/A;  . LEFT HEART CATH N/A 08/23/2011   Procedure: LEFT HEART CATH;  Surgeon: Iran Ouch, MD;  Location: [facility] CATH LAB;  Service: Cardiovascular;  Laterality: N/A;  . NO PAST SURGERIES    . PERCUTANEOUS CORONARY STENT INTERVENTION (PCI-S)  08/23/2011   Procedure: PERCUTANEOUS CORONARY STENT INTERVENTION (PCI-S);  Surgeon: Iran Ouch, MD;  Location: [facility] CATH LAB;  Service: Cardiovascular;;    Prior to Admission medications   Medication Sig Start Date End Date Taking? Authorizing Provider  allopurinol (ZYLOPRIM) 300 MG tablet Take 300 mg by mouth daily.   Yes [provider]  Alpha Lipoic Acid 200 MG CAPS Take 200 mg by mouth daily.    Yes [provider]   aspirin EC 81 MG tablet Take 81 mg by mouth daily.   Yes [provider]  carvedilol (COREG) 6.25 MG tablet Take 6.25 mg by mouth 2 (two) times daily with a meal.    Yes [provider]  chlorthalidone (HYGROTON) 25 MG tablet Take 25 mg by mouth daily.    Yes [provider]  CINNAMON PO Take 1 capsule by mouth daily.   Yes [provider]  Coenzyme Q10 (CO Q-10) 100 MG CAPS Take 100 mg by mouth daily.    Yes [provider]  furosemide (LASIX) 20 MG tablet TAKE ONE TABLET BY MOUTH DAILY AS NEEDED. (TAKE IF WEIGHT INCREASES BY 2 LBS.) Patient taking differently: Take 20 mg by mouth daily as needed for fluid.  02/07/14  Yes BranchDorothe Pea, MD  Green Tea 150 MG CAPS Take 150 mg by mouth daily.    Yes [provider]  lisinopril (PRINIVIL,ZESTRIL) 40 MG tablet Take 40 mg by mouth daily.   Yes [provider]  Multiple Vitamins-Minerals (CENTRUM SILVER 50+MEN PO) Take 1 tablet by mouth daily.   Yes [provider]  polyethylene glycol-electrolytes (TRILYTE) 420 g solution Take 4,000 mLs by mouth as directed. 03/09/17  Yes Rourk, Gerrit Friends, MD  RESVERATROL 100 MG CAPS Take 100 mg by mouth daily.    Yes [provider]  rosuvastatin (CRESTOR) 40 MG tablet TAKE ONE TABLET BY MOUTH ONCE  DAILY. Patient taking differently: TAKE 40 MG BY MOUTH ONCE DAILY. 07/10/16  Yes Antoine PocheBranch, Jonathan F, MD  Turmeric 500 MG CAPS Take 500 mg by mouth daily.    Yes [provider]  vitamin C (ASCORBIC ACID) 500 MG tablet Take 500 mg by mouth daily.   Yes [provider]  nitroGLYCERIN (NITROSTAT) 0.4 MG SL tablet PLACE 1 TAB UNDER TONGUE EVERY 5 MIN IF NEEDED FOR CHEST PAIN. MAY USE 3 TIMES.NO RELIEF CALL 911. Patient taking differently: PLACE 0.4 MG UNDER TONGUE EVERY 5 MIN IF NEEDED FOR CHEST PAIN. MAY USE 3 TIMES.NO RELIEF CALL 911. 09/14/16   Antoine PocheBranch, Jonathan F, MD  sildenafil (VIAGRA) 100 MG tablet TAKE 1/2 TABLET BY MOUTH  30 MINUTES BEFORE SEXUAL ACTIVITY. Patient taking differently: TAKE 50 MG BY MOUTH 30 MINUTES BEFORE SEXUAL ACTIVITY. 09/14/16   Antoine PocheBranch, Jonathan F, MD    Allergies as of 03/09/2017  . (No Known Allergies)    Family History  Problem Relation Age of Onset  . Colon cancer Neg Hx     Social History   Socioeconomic History  . Marital status: Widowed    Spouse name: Not on file  . Number of children: Not on file  . Years of education: Not on file  . Highest education level: Not on file  Occupational History  . Occupation: Control and instrumentation engineerire Inspector  Social Needs  . Financial resource strain: Not on file  . Food insecurity:    Worry: Not on file    Inability: Not on file  . Transportation needs:    Medical: Not on file    Non-medical: Not on file  Tobacco Use  . Smoking status: Former Smoker    Packs/day: 1.00    Years: 25.00    Pack years: 25.00    Types: Cigarettes    Last attempt to quit: 08/23/1979    Years since quitting: 37.6  . Smokeless tobacco: Never Used  Substance and Sexual Activity  . Alcohol use: Yes    Alcohol/week: 2.4 oz    Types: 2 Cans of beer, 2 Shots of liquor per week    Comment: Weekends up to 3 times a week; 2-3 drinks per sitting.  . Drug use: No  . Sexual activity: Yes  Lifestyle  . Physical activity:    Days per week: Not on file    Minutes per session: Not on file  . Stress: Not on file  Relationships  . Social connections:    Talks on phone: Not on file    Gets together: Not on file    Attends religious service: Not on file    Active member of club or organization: Not on file    Attends meetings of clubs or organizations: Not on file    Relationship status: Not on file  . Intimate partner violence:    Fear of current or ex partner: Not on file    Emotionally abused: Not on file    Physically abused: Not on file    Forced sexual activity: Not on file  Other Topics Concern  . Not on file  Social History Narrative   [place] lives in [place], is  out of work till [employer]-Sept 2013 after MI. He is an only child.    Review of Systems: See HPI, otherwise negative ROS  Physical Exam: BP 128/84   Temp 98 F (36.7 C) (Oral)   SpO2 97%  General:   Alert,  Well-developed, well-nourished, pleasant and cooperative in NAD Lungs:  Clear throughout to auscultation.   No wheezes, crackles, or rhonchi. No acute distress. Heart:  Regular rate and rhythm; no murmurs, clicks, rubs,  or gallops. Abdomen:  Soft, nontender and nondistended. No masses, hepatosplenomegaly or hernias noted. Normal bowel sounds, without guarding, and without rebound.    Impression/Plan: Austin Bright is now here to undergo a screening colonoscopy.  Average risk  Screening examination.  Risks, benefits, limitations, imponderables and alternatives regarding colonoscopy have been reviewed with the patient. Questions have been answered. All parties agreeable.     Notice:  This dictation was prepared with Dragon dictation along with smaller phrase technology. Any transcriptional errors that result from this process are unintentional and may not be corrected upon review.

## 2017-04-08 NOTE — Transfer of Care (Signed)
Immediate Anesthesia Transfer of Care Note  Patient: Austin Bright  Procedure(s) Performed: COLONOSCOPY WITH PROPOFOL (N/A )  Patient Location: PACU  Anesthesia Type:MAC  Level of Consciousness: awake, alert , oriented and patient cooperative  Airway & Oxygen Therapy: Patient Spontanous Breathing  Post-op Assessment: Report given to RN and Post -op Vital signs reviewed and stable  Post vital signs: Reviewed and stable  Last Vitals:  Vitals Value Taken Time  BP    Temp    Pulse 68 04/08/2017 12:01 PM  Resp 15 04/08/2017 12:01 PM  SpO2 94 % 04/08/2017 12:01 PM  Vitals shown include unvalidated device data.  Last Pain:  Vitals:   04/08/17 1014  TempSrc: Oral      Patients Stated Pain Goal: 8 (67/28/97 9150)  Complications: No apparent anesthesia complications

## 2017-04-08 NOTE — Anesthesia Postprocedure Evaluation (Signed)
Anesthesia Post Note  Patient: Austin Bright  Procedure(s) Performed: COLONOSCOPY WITH PROPOFOL (N/A )  Patient location during evaluation: PACU Anesthesia Type: MAC Level of consciousness: awake and alert, oriented and patient cooperative Pain management: pain level controlled Vital Signs Assessment: post-procedure vital signs reviewed and stable Respiratory status: respiratory function stable and spontaneous breathing Cardiovascular status: stable Postop Assessment: no apparent nausea or vomiting Anesthetic complications: no     Last Vitals:  Vitals Value Taken Time  BP    Temp    Pulse 68 04/08/2017 12:01 PM  Resp 15 04/08/2017 12:01 PM  SpO2 94 % 04/08/2017 12:01 PM  Vitals shown include unvalidated device data.  Last Pain:  Vitals:   04/08/17 1014  TempSrc: Oral                 Jarissa Sheriff A

## 2017-04-08 NOTE — Anesthesia Preprocedure Evaluation (Signed)
Anesthesia Evaluation  Patient identified by MRN, date of birth, ID band Patient awake    Reviewed: Allergy & Precautions, NPO status , Patient's Chart, lab work & pertinent test results  Airway Mallampati: I  TM Distance: >3 FB Neck ROM: Full    Dental  (+) Teeth Intact   Pulmonary shortness of breath and with exertion, former smoker,    breath sounds clear to auscultation       Cardiovascular hypertension, + CAD, + Past MI, + Cardiac Stents and +CHF ( LVEF 40%)   Rhythm:Regular Rate:Normal     Neuro/Psych    GI/Hepatic negative GI ROS, Neg liver ROS,   Endo/Other  negative endocrine ROS  Renal/GU negative Renal ROS     Musculoskeletal   Abdominal   Peds  Hematology negative hematology ROS (+)   Anesthesia Other Findings   Reproductive/Obstetrics                             Anesthesia Physical Anesthesia Plan  ASA: III  Anesthesia Plan: MAC   Post-op Pain Management:    Induction: Intravenous  PONV Risk Score and Plan:   Airway Management Planned: Simple Face Mask  Additional Equipment:   Intra-op Plan:   Post-operative Plan:   Informed Consent: I have reviewed the patients History and Physical, chart, labs and discussed the procedure including the risks, benefits and alternatives for the proposed anesthesia with the patient or authorized representative who has indicated his/her understanding and acceptance.     Plan Discussed with:   Anesthesia Plan Comments:         Anesthesia Quick Evaluation

## 2017-04-08 NOTE — Anesthesia Procedure Notes (Signed)
Procedure Name: MAC Date/Time: 04/08/2017 11:33 AM Performed by: Andree Elk Amy A, CRNA Pre-anesthesia Checklist: Patient identified, Emergency Drugs available, Suction available, Patient being monitored and Timeout performed Oxygen Delivery Method: Simple face mask

## 2017-04-13 ENCOUNTER — Encounter (HOSPITAL_COMMUNITY): Payer: Self-pay | Admitting: Internal Medicine

## 2017-05-14 ENCOUNTER — Ambulatory Visit: Payer: Medicare Other | Admitting: Psychology

## 2017-06-30 ENCOUNTER — Ambulatory Visit: Payer: Medicare Other | Admitting: Psychology

## 2017-08-13 ENCOUNTER — Other Ambulatory Visit: Payer: Self-pay | Admitting: Cardiology

## 2017-08-13 ENCOUNTER — Ambulatory Visit: Payer: Medicare Other | Admitting: Psychology

## 2017-08-20 ENCOUNTER — Ambulatory Visit: Payer: Medicare Other | Admitting: Cardiology

## 2017-08-20 ENCOUNTER — Encounter: Payer: Self-pay | Admitting: Cardiology

## 2017-08-20 ENCOUNTER — Encounter: Payer: Self-pay | Admitting: *Deleted

## 2017-08-20 VITALS — BP 108/70 | HR 67 | Ht 70.0 in | Wt 215.0 lb

## 2017-08-20 DIAGNOSIS — I10 Essential (primary) hypertension: Secondary | ICD-10-CM

## 2017-08-20 DIAGNOSIS — I2510 Atherosclerotic heart disease of native coronary artery without angina pectoris: Secondary | ICD-10-CM | POA: Diagnosis not present

## 2017-08-20 DIAGNOSIS — E782 Mixed hyperlipidemia: Secondary | ICD-10-CM

## 2017-08-20 DIAGNOSIS — I5189 Other ill-defined heart diseases: Secondary | ICD-10-CM | POA: Diagnosis not present

## 2017-08-20 NOTE — Progress Notes (Signed)
Clinical Summary [name] is a 72 y.o.male seen today for follow up of the following medical problems.   1. CAD - Prior anterior wall MI in 08/2011, received DES. LVEF in setting of MI 35% - echo 11/2011 LVEF 50-55%, grade II diastolic dysfunction.    - walks over a mild several days a week, toelrates well - no recent symptoms - compliant with meds  2. HTN  - home bp's 110s/70s  3. Hyperlipidemia - compliant with statin   4. Diastolic dysfunction - grade II diastolic dysfunction by most recent echo 11/2011  - no recent edmea.   5. Former tobacco - 2012 AAA [place] without aneurysm    SH: wife  passed May 2017, laryngeal cancer. . Works as Automotive engineer.     Past Medical History:  Diagnosis Date  . Acute MI (HCC) 08/2011  . Arthritis   . Bilateral pneumonia    Diagnosed after STEMI 08/2011  . CAD (coronary artery disease)    a. Diagnosed 08/2011 with anterior STEMI due to thrombotic occlusion of mid LAD s/p thrombectomy, PTCA, DES placement 08/23/11.  Marland Kitchen CHF (congestive heart failure) (HCC)   . Dyslipidemia   . Gout   . Hypertension   . Ischemic cardiomyopathy    a. Initial EF 35% by cath 08/23/11, improved to 40-45% by echo 08/25/11.  Marland Kitchen Shortness of breath      No Known Allergies   Current Outpatient Medications  Medication Sig Dispense Refill  . allopurinol (ZYLOPRIM) 300 MG tablet Take 300 mg by mouth daily.    Allen Kell Lipoic Acid 200 MG CAPS Take 200 mg by mouth daily.     Marland Kitchen aspirin EC 81 MG tablet Take 81 mg by mouth daily.    . carvedilol (COREG) 6.25 MG tablet Take 6.25 mg by mouth 2 (two) times daily with a meal.     . chlorthalidone (HYGROTON) 25 MG tablet Take 25 mg by mouth daily.     . chlorthalidone (HYGROTON) 25 MG tablet TAKE 1/2 TABLET BY MOUTH ONCE DAILY. 45 tablet 6  . CINNAMON PO Take 1 capsule by mouth daily.    . Coenzyme Q10 (CO Q-10) 100 MG CAPS Take 100 mg by mouth daily.     . furosemide (LASIX) 20 MG  tablet TAKE ONE TABLET BY MOUTH DAILY AS NEEDED. (TAKE IF WEIGHT INCREASES BY 2 LBS.) (Patient taking differently: Take 20 mg by mouth daily as needed for fluid. ) 30 tablet 3  . Green Tea 150 MG CAPS Take 150 mg by mouth daily.     Marland Kitchen lisinopril (PRINIVIL,ZESTRIL) 40 MG tablet Take 40 mg by mouth daily.    . Multiple Vitamins-Minerals (CENTRUM SILVER 50+MEN PO) Take 1 tablet by mouth daily.    . nitroGLYCERIN (NITROSTAT) 0.4 MG SL tablet PLACE 1 TAB UNDER TONGUE EVERY 5 MIN IF NEEDED FOR CHEST PAIN. MAY USE 3 TIMES.NO RELIEF CALL 911. (Patient taking differently: PLACE 0.4 MG UNDER TONGUE EVERY 5 MIN IF NEEDED FOR CHEST PAIN. MAY USE 3 TIMES.NO RELIEF CALL 911.) 25 tablet 0  . polyethylene glycol-electrolytes (TRILYTE) 420 g solution Take 4,000 mLs by mouth as directed. 4000 mL 0  . RESVERATROL 100 MG CAPS Take 100 mg by mouth daily.     . rosuvastatin (CRESTOR) 40 MG tablet TAKE ONE TABLET BY MOUTH ONCE DAILY. (Patient taking differently: TAKE 40 MG BY MOUTH ONCE DAILY.) 90 tablet 3  . sildenafil (VIAGRA) 100 MG tablet TAKE 1/2 TABLET  BY MOUTH 30 MINUTES BEFORE SEXUAL ACTIVITY. (Patient taking differently: TAKE 50 MG BY MOUTH 30 MINUTES BEFORE SEXUAL ACTIVITY.) 4 tablet 0  . Turmeric 500 MG CAPS Take 500 mg by mouth daily.     . vitamin C (ASCORBIC ACID) 500 MG tablet Take 500 mg by mouth daily.     No current facility-administered medications for this visit.      Past Surgical History:  Procedure Laterality Date  . carpel tunnel Bilateral 2002  . COLONOSCOPY WITH PROPOFOL N/A 04/08/2017   Procedure: COLONOSCOPY WITH PROPOFOL;  Surgeon: Corbin Adeourk, Robert M, MD;  [facility];  Service: Endoscopy;  Laterality: N/A;  11:15am  . CORONARY STENT PLACEMENT    . CYSTECTOMY  1969   pilonidal cyst  . LEFT AND RIGHT HEART CATHETERIZATION WITH CORONARY ANGIOGRAM N/A 09/15/2011   Procedure: LEFT AND RIGHT HEART CATHETERIZATION WITH CORONARY ANGIOGRAM;  Surgeon: Herby Abrahamhomas D Stuckey, MD;  [facility];  Service: Cardiovascular;  Laterality: N/A;  . LEFT HEART CATH N/A 08/23/2011   Procedure: LEFT HEART CATH;  Surgeon: Iran OuchMuhammad A Arida, MD;  Location: [facility] CATH LAB;  Service: Cardiovascular;  Laterality: N/A;  . NO PAST SURGERIES    . PERCUTANEOUS CORONARY STENT INTERVENTION (PCI-S)  08/23/2011   Procedure: PERCUTANEOUS CORONARY STENT INTERVENTION (PCI-S);  Surgeon: Iran OuchMuhammad A Arida, MD;  [facility];  Service: Cardiovascular;;     No Known Allergies    Family History  Problem Relation Age of Onset  . Colon cancer Neg Hx      Social History [name] reports that he quit smoking about 38 years ago. His smoking use included cigarettes. He has a 25.00 pack-year smoking history. He has never used smokeless tobacco. [name] reports that he drinks about 2.4 oz of alcohol per week.   Review of Systems CONSTITUTIONAL: No weight loss, fever, chills, weakness or fatigue.  HEENT: Eyes: No visual loss, blurred vision, double vision or yellow sclerae.No hearing loss, sneezing, congestion, runny nose or sore throat.  SKIN: No rash or itching.  CARDIOVASCULAR: per hpi RESPIRATORY: No shortness of breath, cough or sputum.  GASTROINTESTINAL: No anorexia, nausea, vomiting or diarrhea. No abdominal pain or blood.  GENITOURINARY: No burning on urination, no polyuria NEUROLOGICAL: No headache, dizziness, syncope, paralysis, ataxia, numbness or tingling in the extremities. No change in bowel or bladder control.  MUSCULOSKELETAL: No muscle, back pain, joint pain or stiffness.  LYMPHATICS: No enlarged nodes. No history of splenectomy.  PSYCHIATRIC: No history of depression or anxiety.  ENDOCRINOLOGIC: No reports of sweating, cold or heat intolerance. No polyuria or polydipsia.  Marland Kitchen.   Physical Examination Vitals:   08/20/17 0853  BP: 108/70  Pulse: 67  SpO2: 96%   Vitals:   08/20/17 0853  Weight: 215 lb (97.5 kg)  Height: 5\' 10"  (1.778 m)    Gen: resting comfortably,  no acute distress HEENT: no scleral icterus, pupils equal round and reactive, no palptable cervical adenopathy,  CV: RRR, no /mr/g, no jvd Resp: Clear to auscultation bilaterally GI: abdomen is soft, non-tender, non-distended, normal bowel sounds, no hepatosplenomegaly MSK: extremities are warm, no edema.  Skin: warm, no rash Neuro:  no focal deficits Psych: appropriate affect   Diagnostic Studies 11/2011 Echo Study Conclusions  - Left ventricle: The cavity size was normal. Wall thickness was normal. Systolic function was normal. The estimated ejection fraction was in the range of 50% to 55%. There is hypokinesis of the mid-distalinferoseptal myocardium. There is hypokinesis of  the distalanteroseptal myocardium. Features are consistent with a pseudonormal left ventricular filling pattern, with concomitant abnormal relaxation and increased filling pressure (grade 2 diastolic dysfunction). Doppler parameters are consistent with elevated ventricular end-diastolic filling pressure. - Aortic valve: Mildly calcified annulus. Trileaflet; mildly calcified leaflets. - Mitral valve: Trivial regurgitation. - Left atrium: The atrium was mildly dilated. - Right ventricle: The cavity size was mildly dilated. - Tricuspid valve: Trivial regurgitation. - Pulmonary arteries: PA peak pressure: 35mm Hg (S). - Pericardium, extracardiac: There was no pericardial effusion.         Assessment and Plan  1. CAD - no recent symptoms, conitnue current meds - EKG today shows SR, no ischemic changes   2. HTN - bp at goal, continue current meds  3. Hyperlipidemia - continue statin, request labs from pcp  4. Diastolic dysfunction - no recent edema, sob or doe - continue to monitor.        Antoine Poche, M.D.

## 2017-08-20 NOTE — Patient Instructions (Signed)
Medication Instructions:  Your physician recommends that you continue on your current medications as directed. Please refer to the Current Medication list given to you today.   Labwork: NONE   Testing/Procedures: NONE   Follow-Up: Your physician wants you to follow-up in: 1 Year. You will receive a reminder letter in the mail two months in advance. If you don't receive a letter, please call our office to schedule the follow-up appointment.   Any Other Special Instructions Will Be Listed Below (If Applicable).     If you need a refill on your cardiac medications before your next appointment, please call your pharmacy.  Thank you for choosing [facility]!   

## 2017-08-28 ENCOUNTER — Encounter: Payer: Self-pay | Admitting: Cardiology

## 2017-09-16 ENCOUNTER — Other Ambulatory Visit: Payer: Self-pay | Admitting: Cardiology

## 2017-09-28 ENCOUNTER — Ambulatory Visit: Payer: Medicare Other | Admitting: Psychology

## 2017-09-28 ENCOUNTER — Ambulatory Visit: Payer: Self-pay | Admitting: Psychology

## 2017-10-11 ENCOUNTER — Other Ambulatory Visit: Payer: Self-pay | Admitting: Cardiology

## 2017-11-11 ENCOUNTER — Ambulatory Visit: Payer: Medicare Other | Admitting: Psychology

## 2017-12-28 ENCOUNTER — Ambulatory Visit: Payer: Medicare Other | Admitting: Psychology

## 2018-01-20 ENCOUNTER — Other Ambulatory Visit: Payer: Self-pay | Admitting: Cardiology

## 2018-02-07 ENCOUNTER — Ambulatory Visit: Payer: Medicare Other | Admitting: Psychology

## 2018-03-17 ENCOUNTER — Ambulatory Visit: Payer: Medicare Other | Admitting: Psychology

## 2018-06-15 ENCOUNTER — Other Ambulatory Visit: Payer: Self-pay | Admitting: Cardiology

## 2018-06-15 MED ORDER — FUROSEMIDE 20 MG PO TABS: ORAL_TABLET | ORAL | 3 refills | Status: DC

## 2018-06-15 NOTE — Telephone Encounter (Signed)
°* °  1. Which medications need to be refilled? (please list name of each medication and dose if known) LASIX) 20 MG tablet    2. Which pharmacy/location (including street and city if local pharmacy) is medication to be sent to? [facility]   3. Do they need a 30 day or 90 day supply?

## 2018-06-15 NOTE — Telephone Encounter (Signed)
refilled 

## 2018-08-25 ENCOUNTER — Encounter: Payer: Self-pay | Admitting: Cardiology

## 2018-08-25 ENCOUNTER — Other Ambulatory Visit: Payer: Self-pay

## 2018-08-25 ENCOUNTER — Ambulatory Visit (INDEPENDENT_AMBULATORY_CARE_PROVIDER_SITE_OTHER): Payer: Medicare Other | Admitting: Cardiology

## 2018-08-25 VITALS — BP 131/81 | HR 64 | Temp 97.4°F | Ht 70.0 in | Wt 195.0 lb

## 2018-08-25 DIAGNOSIS — I2510 Atherosclerotic heart disease of native coronary artery without angina pectoris: Secondary | ICD-10-CM

## 2018-08-25 DIAGNOSIS — I10 Essential (primary) hypertension: Secondary | ICD-10-CM | POA: Diagnosis not present

## 2018-08-25 DIAGNOSIS — E782 Mixed hyperlipidemia: Secondary | ICD-10-CM

## 2018-08-25 DIAGNOSIS — I5189 Other ill-defined heart diseases: Secondary | ICD-10-CM

## 2018-08-25 MED ORDER — CHLORTHALIDONE 25 MG PO TABS: 12.5000 mg | ORAL_TABLET | Freq: Every day | ORAL | 6 refills | Status: DC

## 2018-08-25 NOTE — Progress Notes (Signed)
Clinical Summary Austin Bright is a 73 y.o.male seen today for follow up of the following medical problems.   1. CAD - Prior anterior wall MI in 08/2011, received DES. LVEF in setting of MI 35% - echo 11/2011 LVEF 50-55%, grade II diastolic dysfunction.     - no recent chest pain. No SOB/DOE - walks daily from 3/4 of a mile to 2.5 mile - compliant with meds.   2. HTN  - home bp's are 120-140s/60-80s.  - he has tried stopping his chlrothalidone at home to see if could do without it   3. Hyperlipidemia - compliant with statin - 03/2017 TC 148 TG 111 HDL 48 LDL 78  4. Diastolic dysfunction - grade II diastolic dysfunction by most recent echo 11/2011  -takes prn lasix about once a week.   19. Former tobacco - 2012 AAA [place] without aneurysm    SH: wife  passed May 2017, laryngeal cancer.  Works as Glass blower/designer.    Past Medical History:  Diagnosis Date  . Acute MI ([place]) 08/2011  . Arthritis   . Bilateral pneumonia    Diagnosed after STEMI 08/2011  . CAD (coronary artery disease)    a. Diagnosed 08/2011 with anterior STEMI due to thrombotic occlusion of mid LAD s/p thrombectomy, PTCA, DES placement 08/23/11.  Marland Kitchen CHF (congestive heart failure) ([place])   . Dyslipidemia   . Gout   . Hypertension   . Ischemic cardiomyopathy    a. Initial EF 35% by cath 08/23/11, improved to 40-45% by echo 08/25/11.  Marland Kitchen Shortness of breath      No Known Allergies   Current Outpatient Medications  Medication Sig Dispense Refill  . allopurinol (ZYLOPRIM) 300 MG tablet Take 300 mg by mouth daily.    Austin Bright Lipoic Acid 200 MG CAPS Take 200 mg by mouth daily.     Marland Kitchen aspirin EC 81 MG tablet Take 81 mg by mouth daily.    . carvedilol (COREG) 6.25 MG tablet Take 6.25 mg by mouth 2 (two) times daily with a meal.     . chlorthalidone (HYGROTON) 25 MG tablet TAKE 1/2 TABLET BY MOUTH ONCE DAILY. 45 tablet 6  . CINNAMON PO Take 1 capsule by mouth daily.    . Coenzyme  Q10 (CO Q-10) 100 MG CAPS Take 100 mg by mouth daily.     . furosemide (LASIX) 20 MG tablet TAKE ONE TABLET BY MOUTH DAILY AS NEEDED. (TAKE IF WEIGHT INCREASES BY 2 LBS.) 30 tablet 3  . Green Tea 150 MG CAPS Take 150 mg by mouth daily.     Marland Kitchen lisinopril (PRINIVIL,ZESTRIL) 40 MG tablet Take 40 mg by mouth daily.    . Multiple Vitamins-Minerals (CENTRUM SILVER 50+MEN PO) Take 1 tablet by mouth daily.    . nitroGLYCERIN (NITROSTAT) 0.4 MG SL tablet PLACE 1 TAB UNDER TONGUE EVERY 5 MIN IF NEEDED FOR CHEST PAIN. MAY USE 3 TIMES.NO RELIEF CALL 911. 25 tablet 0  . RESVERATROL 100 MG CAPS Take 100 mg by mouth daily.     . rosuvastatin (CRESTOR) 40 MG tablet TAKE ONE TABLET BY MOUTH ONCE DAILY. 90 tablet 3  . sildenafil (VIAGRA) 100 MG tablet TAKE 1/2 TABLET BY MOUTH 30 MINUTES BEFORE SEXUAL ACTIVITY. (Patient taking differently: TAKE 50 MG BY MOUTH 30 MINUTES BEFORE SEXUAL ACTIVITY.) 4 tablet 0  . Turmeric 500 MG CAPS Take 500 mg by mouth daily.     . vitamin C (ASCORBIC ACID) 500 MG  tablet Take 500 mg by mouth daily.     No current facility-administered medications for this visit.      Past Surgical History:  Procedure Laterality Date  . carpel tunnel Bilateral 2002  . COLONOSCOPY WITH PROPOFOL N/A 04/08/2017   Procedure: COLONOSCOPY WITH PROPOFOL;  Surgeon: Corbin Adeourk, Robert M, MD;  [facility];  Service: Endoscopy;  Laterality: N/A;  11:15am  . CORONARY STENT PLACEMENT    . CYSTECTOMY  1969   pilonidal cyst  . LEFT AND RIGHT HEART CATHETERIZATION WITH CORONARY ANGIOGRAM N/A 09/15/2011   Procedure: LEFT AND RIGHT HEART CATHETERIZATION WITH CORONARY ANGIOGRAM;  Surgeon: Herby Abrahamhomas D Stuckey, MD;  [facility];  Service: Cardiovascular;  Laterality: N/A;  . LEFT HEART CATH N/A 08/23/2011   Procedure: LEFT HEART CATH;  Surgeon: Iran OuchMuhammad A Arida, MD;  Location: [facility] CATH [facility];  Service: Cardiovascular;  Laterality: N/A;  . NO PAST SURGERIES    . PERCUTANEOUS CORONARY STENT INTERVENTION  (PCI-S)  08/23/2011   Procedure: PERCUTANEOUS CORONARY STENT INTERVENTION (PCI-S);  Surgeon: Iran OuchMuhammad A Arida, MD;  [facility];  Service: Cardiovascular;;     No Known Allergies    Family History  Problem Relation Age of Onset  . Colon cancer Neg Hx      Social History Austin Bright reports that he quit smoking about 39 years ago. His smoking use included cigarettes. He has a 25.00 pack-year smoking history. He has never used smokeless tobacco. Austin Bright reports current alcohol use of about 4.0 standard drinks of alcohol per week.   Review of Systems CONSTITUTIONAL: No weight loss, fever, chills, weakness or fatigue.  HEENT: Eyes: No visual loss, blurred vision, double vision or yellow sclerae.No hearing loss, sneezing, congestion, runny nose or sore throat.  SKIN: No rash or itching.  CARDIOVASCULAR: per hpi RESPIRATORY: No shortness of breath, cough or sputum.  GASTROINTESTINAL: No anorexia, nausea, vomiting or diarrhea. No abdominal pain or blood.  GENITOURINARY: No burning on urination, no polyuria NEUROLOGICAL: No headache, dizziness, syncope, paralysis, ataxia, numbness or tingling in the extremities. No change in bowel or bladder control.  MUSCULOSKELETAL: No muscle, back pain, joint pain or stiffness.  LYMPHATICS: No enlarged nodes. No history of splenectomy.  PSYCHIATRIC: No history of depression or anxiety.  ENDOCRINOLOGIC: No reports of sweating, cold or heat intolerance. No polyuria or polydipsia.  Marland Kitchen.   Physical Examination Today's Vitals   08/25/18 0830  BP: 131/81  Pulse: 64  Temp: (!) 97.4 F (36.3 C)  TempSrc: Temporal  SpO2: 97%  Weight: 195 lb (88.5 kg)  Height: 5\' 10"  (1.778 m)   Body mass index is 27.98 kg/m.  Gen: resting comfortably, no acute distress HEENT: no scleral icterus, pupils equal round and reactive, no palptable cervical adenopathy,  CV: RRR, no m/r/,g no jvd Resp: Clear to auscultation bilaterally GI: abdomen is soft,  non-tender, non-distended, normal bowel sounds, no hepatosplenomegaly MSK: extremities are warm, no edema.  Skin: warm, no rash Neuro:  no focal deficits Psych: appropriate affect   Diagnostic Studies  11/2011 Echo Study Conclusions  - Left ventricle: The cavity size was normal. Wall thickness was normal. Systolic function was normal. The estimated ejection fraction was in the range of 50% to 55%. There is hypokinesis of the mid-distalinferoseptal myocardium. There is hypokinesis of the distalanteroseptal myocardium. Features are consistent with a pseudonormal left ventricular filling pattern, with concomitant abnormal relaxation and increased filling pressure (grade 2 diastolic dysfunction). Doppler parameters are consistent with elevated ventricular end-diastolic  filling pressure. - Aortic valve: Mildly calcified annulus. Trileaflet; mildly calcified leaflets. - Mitral valve: Trivial regurgitation. - Left atrium: The atrium was mildly dilated. - Right ventricle: The cavity size was mildly dilated. - Tricuspid valve: Trivial regurgitation. - Pulmonary arteries: PA peak pressure: 35mm Hg (S). - Pericardium, extracardiac: There was no pericardial effusion.   Assessment and Plan  1. CAD - denies any symptoms, compliant with meds - EKG today shows SR, anterior Qwaves, no acute ischemic changes   2. HTN - above goal at home, restart his chlorthalidone 12.5mg  daily  3. Hyperlipidemia - continue statin. F/u labs from pcp   4. Diastolic dysfunction - doing well, continue prn lasix  F/u 1 year    Antoine PocheJonathan F. Branch, M.D.

## 2018-08-25 NOTE — Patient Instructions (Signed)

## 2018-09-12 ENCOUNTER — Other Ambulatory Visit: Payer: Self-pay

## 2018-09-12 DIAGNOSIS — Z20822 Contact with and (suspected) exposure to covid-19: Secondary | ICD-10-CM

## 2018-09-13 LAB — NOVEL CORONAVIRUS, NAA: SARS-CoV-2, NAA: NOT DETECTED

## 2018-10-21 ENCOUNTER — Other Ambulatory Visit (HOSPITAL_COMMUNITY)
Admission: RE | Admit: 2018-10-21 | Discharge: 2018-10-21 | Disposition: A | Discharge status: Home / Self Care | Payer: Medicare Other | Source: Ambulatory Visit | Attending: Pulmonary Disease | Admitting: Pulmonary Disease

## 2018-10-21 DIAGNOSIS — Z634 Disappearance and death of family member: Secondary | ICD-10-CM | POA: Insufficient documentation

## 2018-10-21 DIAGNOSIS — E785 Hyperlipidemia, unspecified: Secondary | ICD-10-CM | POA: Diagnosis present

## 2018-10-21 DIAGNOSIS — I110 Hypertensive heart disease with heart failure: Secondary | ICD-10-CM | POA: Diagnosis not present

## 2018-10-21 DIAGNOSIS — M1990 Unspecified osteoarthritis, unspecified site: Secondary | ICD-10-CM | POA: Insufficient documentation

## 2018-10-21 DIAGNOSIS — R739 Hyperglycemia, unspecified: Secondary | ICD-10-CM | POA: Insufficient documentation

## 2018-10-21 DIAGNOSIS — I2510 Atherosclerotic heart disease of native coronary artery without angina pectoris: Secondary | ICD-10-CM | POA: Insufficient documentation

## 2018-10-21 DIAGNOSIS — I509 Heart failure, unspecified: Secondary | ICD-10-CM | POA: Diagnosis not present

## 2018-10-21 LAB — COMPREHENSIVE METABOLIC PANEL
ALT: 19 U/L (ref 0–44)
AST: 23 U/L (ref 15–41)
Albumin: 4.5 g/dL (ref 3.5–5.0)
Alkaline Phosphatase: 76 U/L (ref 38–126)
Anion gap: 12 (ref 5–15)
BUN: 25 mg/dL — ABNORMAL HIGH (ref 8–23)
CO2: 28 mmol/L (ref 22–32)
Calcium: 9.1 mg/dL (ref 8.9–10.3)
Chloride: 96 mmol/L — ABNORMAL LOW (ref 98–111)
Creatinine, Ser: 1.04 mg/dL (ref 0.61–1.24)
GFR calc Af Amer: >60 mL/min (ref >60)
GFR calc non Af Amer: >60 mL/min (ref >60)
Glucose, Bld: 108 mg/dL — ABNORMAL HIGH (ref 70–99)
Potassium: 3.8 mmol/L (ref 3.5–5.1)
Sodium: 136 mmol/L (ref 135–145)
Total Bilirubin: 0.7 mg/dL (ref 0.3–1.2)
Total Protein: 7.7 g/dL (ref 6.5–8.1)

## 2018-10-21 LAB — URINALYSIS, ROUTINE W REFLEX MICROSCOPIC
Bilirubin Urine: NEGATIVE
Glucose, UA: NEGATIVE mg/dL
Hgb urine dipstick: NEGATIVE
Ketones, ur: NEGATIVE mg/dL
Leukocytes,Ua: NEGATIVE
Nitrite: NEGATIVE
Protein, ur: NEGATIVE mg/dL
Specific Gravity, Urine: 1.015 (ref 1.005–1.030)
pH: 6 (ref 5.0–8.0)

## 2018-10-21 LAB — LIPID PANEL
Cholesterol: 147 mg/dL (ref 0–200)
HDL: 55 mg/dL (ref >40)
LDL Cholesterol: 75 mg/dL (ref 0–99)
Total CHOL/HDL Ratio: 2.7 RATIO
Triglycerides: 86 mg/dL (ref <150)
VLDL: 17 mg/dL (ref 0–40)

## 2018-10-21 LAB — CBC
HCT: 49.4 % (ref 39.0–52.0)
Hemoglobin: 16.7 g/dL (ref 13.0–17.0)
MCH: 32.2 pg (ref 26.0–34.0)
MCHC: 33.8 g/dL (ref 30.0–36.0)
MCV: 95.2 fL (ref 80.0–100.0)
Platelets: 164 10*3/uL (ref 150–400)
RBC: 5.19 MIL/uL (ref 4.22–5.81)
RDW: 13.5 % (ref 11.5–15.5)
WBC: 7.2 10*3/uL (ref 4.0–10.5)
nRBC: 0 % (ref 0.0–0.2)

## 2018-10-21 LAB — TSH: TSH: 1.184 u[IU]/mL (ref 0.350–4.500)

## 2018-10-26 LAB — CULTURE, BLOOD (ROUTINE X 2)
Culture: NO GROWTH
Culture: NO GROWTH

## 2018-11-16 ENCOUNTER — Other Ambulatory Visit: Payer: Self-pay

## 2018-11-16 ENCOUNTER — Encounter: Payer: Self-pay | Admitting: Family Medicine

## 2018-11-16 ENCOUNTER — Ambulatory Visit: Payer: Medicare Other | Admitting: Family Medicine

## 2018-11-16 VITALS — BP 109/74 | HR 77 | Temp 98.9°F | Ht 70.0 in | Wt 197.8 lb

## 2018-11-16 DIAGNOSIS — M1000 Idiopathic gout, unspecified site: Secondary | ICD-10-CM | POA: Diagnosis not present

## 2018-11-16 DIAGNOSIS — I2510 Atherosclerotic heart disease of native coronary artery without angina pectoris: Secondary | ICD-10-CM | POA: Diagnosis not present

## 2018-11-16 DIAGNOSIS — E785 Hyperlipidemia, unspecified: Secondary | ICD-10-CM

## 2018-11-16 DIAGNOSIS — I5042 Chronic combined systolic (congestive) and diastolic (congestive) heart failure: Secondary | ICD-10-CM | POA: Diagnosis not present

## 2018-11-16 NOTE — Patient Instructions (Signed)
Trial of 1/4-25mg  chlorthalidone-take blood pressure-first morning or any symptoms

## 2018-11-16 NOTE — Progress Notes (Signed)
New Patient Office Visit  Subjective:  Patient ID: Austin Bright, male    DOB: 04-Feb-1945  Age: 73 y.o. MRN: [id number]  CC:  Chief Complaint  Patient presents with  . Establish Care  . Hypertension    blood pressure now running to low    HPI EDMUNDO Bright Bright presents for  CAD - Prior anterior wall MI in 08/2011, received DES. LVEF in setting of MI 35% - echo 11/2011 LVEF 50-55%, grade Bright diastolic dysfunction.  - no recent chest pain. No SOB/DOE - walks daily from 3/4 of a mile to 2.5 mile - compliant with meds-lisinopril 20(previously 40mg ), carvediolol 6.25-(1/2) chlorthalidone 12.5mg  daily-8/20 Lasix-taken prn for weight gain-nutrisystem weight loss system-weight loss 24 Pt keeping log of bp at home-110/60 or below  Hyperlipidemia - compliant with statin - 03/2017 TC 148 TG 111 HDL 48 LDL 78   Diastolic dysfunction - grade Bright diastolic dysfunction by most recent echo 11/2011  -takes prn lasix about once a week.   5. Former tobacco - 2012 AAA [place] without aneurysm Past Medical History:  Diagnosis Date  . Acute MI ([place]) 08/2011  . Arthritis   . Bilateral pneumonia    Diagnosed after STEMI 08/2011  . CAD (coronary artery disease)    a. Diagnosed 08/2011 with anterior STEMI due to thrombotic occlusion of mid LAD s/p thrombectomy, PTCA, DES placement 08/23/11.  Marland Kitchen CHF (congestive heart failure) ([place])   . Dyslipidemia   . Gout   . Hypertension   . Ischemic cardiomyopathy    a. Initial EF 35% by cath 08/23/11, improved to 40-45% by echo 08/25/11.  Marland Kitchen Shortness of breath     Past Surgical History:  Procedure Laterality Date  . carpel tunnel Bilateral 2002  . COLONOSCOPY WITH PROPOFOL N/A 04/08/2017   Procedure: COLONOSCOPY WITH PROPOFOL;  Surgeon: Daneil Dolin, MD;  [facility];  Service: Endoscopy;  Laterality: N/A;  11:15am  . CORONARY STENT PLACEMENT    . CYSTECTOMY  1969   pilonidal cyst  . LEFT AND RIGHT HEART CATHETERIZATION WITH CORONARY  ANGIOGRAM N/A 09/15/2011   Procedure: LEFT AND RIGHT HEART CATHETERIZATION WITH CORONARY ANGIOGRAM;  Surgeon: Hillary Bow, MD;  [facility] CATH LAB;  Service: Cardiovascular;  Laterality: N/A;  . LEFT HEART CATH N/A 08/23/2011   Procedure: LEFT HEART CATH;  Surgeon: Wellington Hampshire, MD;  Location: [place] CATH LAB;  Service: Cardiovascular;  Laterality: N/A;  . NO PAST SURGERIES    . PERCUTANEOUS CORONARY STENT INTERVENTION (PCI-S)  08/23/2011   Procedure: PERCUTANEOUS CORONARY STENT INTERVENTION (PCI-S);  Surgeon: Wellington Hampshire, MD;  Location: [facility] CATH LAB;  Service: Cardiovascular;;    Family History  Problem Relation Age of Onset  . Hypertension Father   . Stroke Mother   . Colon cancer Neg Hx     Social History   Socioeconomic History  . Marital status: Widowed    Spouse name: Not on file  . Number of children: Not on file  . Years of education: Not on file  . Highest education level: Not on file  Occupational History  . Occupation: Product manager  . Financial resource strain: Not on file  . Food insecurity    Worry: Not on file    Inability: Not on file  . Transportation needs    Medical: Not on file    Non-medical: Not on file  Tobacco Use  . Smoking status: Former Smoker  Packs/day: 1.00    Years: 25.00    Pack years: 25.00    Types: Cigarettes    Quit date: 08/23/1979    Years since quitting: 39.2  . Smokeless tobacco: Never Used  Substance and Sexual Activity  . Alcohol use: Yes    Alcohol/week: 4.0 standard drinks    Types: 2 Cans of beer, 2 Shots of liquor per week    Comment: Weekends up to 3 times a week; 2-3 drinks per sitting.  . Drug use: No  . Sexual activity: Yes  Lifestyle  . Physical activity    Days per week: Not on file    Minutes per session: Not on file  . Stress: Not on file  Relationships  . Social Musicianconnections    Talks on phone: Not on file    Gets together: Not on file    Attends religious service: Not on file     Active member of club or organization: Not on file    Attends meetings of clubs or organizations: Not on file    Relationship status: Not on file  . Intimate partner violence    Fear of current or ex partner: Not on file    Emotionally abused: Not on file    Physically abused: Not on file    Forced sexual activity: Not on file  Other Topics Concern  . Not on file  Social History Narrative   [place], is out of work till [employer] 2013 after MI. He is an only child.    ROS Review of Systems  Constitutional: Positive for fever.  HENT: Positive for hearing loss.        Right ear-hearing loss  Eyes:       Glasses  Respiratory: Negative.   Cardiovascular: Negative for chest pain.       CAD-no cp asa  Gastrointestinal: Negative.   Endocrine: Negative.   Musculoskeletal:       Gout-no recent attack  Skin:       Pre-Ca -forehead-Hedley Derm  Neurological: Negative.   Hematological: Negative.   Psychiatric/Behavioral: Negative.     Objective:   Today's Vitals: BP 109/74 (BP Location: Left Arm, Patient Position: Sitting, Cuff Size: Normal)   Pulse 77   Temp 98.9 F (37.2 C) (Oral)   Ht 5\' 10"  (1.778 m)   Wt 197 lb 12.8 oz (89.7 kg)   SpO2 98%   BMI 28.38 kg/m   Physical Exam Constitutional:      Appearance: Normal appearance.  HENT:     Head: Normocephalic and atraumatic.     Right Ear: Tympanic membrane normal.     Left Ear: Tympanic membrane normal.     Nose: Nose normal.  Eyes:     Conjunctiva/sclera: Conjunctivae normal.  Neck:     Musculoskeletal: Normal range of motion and neck supple.  Cardiovascular:     Rate and Rhythm: Normal rate and regular rhythm.     Pulses: Normal pulses.     Heart sounds: Normal heart sounds.  Pulmonary:     Effort: Pulmonary effort is normal.     Breath sounds: Normal breath sounds.  Neurological:     Mental Status: He is alert and oriented to person, place, and time.  Psychiatric:        Mood and Affect:  Mood normal.        Behavior: Behavior normal.     Assessment & Plan:    Outpatient Encounter Medications as of 11/16/2018  Medication  Sig  . allopurinol (ZYLOPRIM) 300 MG tablet Take 300 mg by mouth daily.  Allen Kell Lipoic Acid 200 MG CAPS Take 200 mg by mouth daily.   Marland Kitchen aspirin EC 81 MG tablet Take 81 mg by mouth daily.  . carvedilol (COREG) 6.25 MG tablet Take 6.25 mg by mouth 2 (two) times daily with a meal.   . chlorthalidone (HYGROTON) 25 MG tablet Take 0.5 tablets (12.5 mg total) by mouth daily.  Marland Kitchen CINNAMON PO Take 1 capsule by mouth daily.  . Coenzyme Q10 (CO Q-10) 100 MG CAPS Take 100 mg by mouth daily.   . furosemide (LASIX) 20 MG tablet TAKE ONE TABLET BY MOUTH DAILY AS NEEDED. (TAKE IF WEIGHT INCREASES BY 2 LBS.)  . Green Tea 150 MG CAPS Take 150 mg by mouth daily.   Marland Kitchen lisinopril (PRINIVIL,ZESTRIL) 40 MG tablet Take 40 mg by mouth daily.  . Multiple Vitamins-Minerals (CENTRUM SILVER 50+MEN PO) Take 1 tablet by mouth daily.  . nitroGLYCERIN (NITROSTAT) 0.4 MG SL tablet PLACE 1 TAB UNDER TONGUE EVERY 5 MIN IF NEEDED FOR CHEST PAIN. MAY USE 3 TIMES.NO RELIEF CALL 911.  Marland Kitchen RESVERATROL 100 MG CAPS Take 100 mg by mouth daily.   . rosuvastatin (CRESTOR) 40 MG tablet TAKE ONE TABLET BY MOUTH ONCE DAILY.  . sildenafil (VIAGRA) 100 MG tablet TAKE 1/2 TABLET BY MOUTH 30 MINUTES BEFORE SEXUAL ACTIVITY. (Patient taking differently: TAKE 50 MG BY MOUTH 30 MINUTES BEFORE SEXUAL ACTIVITY.)  . Turmeric 500 MG CAPS Take 500 mg by mouth daily.   . vitamin C (ASCORBIC ACID) 500 MG tablet Take 500 mg by mouth daily.   No facility-administered encounter medications on file as of 11/16/2018.   1. Coronary artery disease involving native coronary artery of native heart without angina pectoris asa  2. Dyslipidemia lipitor-daily  3. Chronic combined systolic and diastolic congestive heart failure (HCC) No recent lasix Lisinopril-now taking 20mg , carvediolol-12.5mg , chlorthalidone12.5-1l2 dose-pt  with low blood pressure since chlorthalidone added and weight loss 4. Idiopathic gout, unspecified chronicity, unspecified site Allopurinol-no recent  Gout attack  Follow-up:  6 months  , MD

## 2018-11-30 ENCOUNTER — Other Ambulatory Visit: Payer: Self-pay | Admitting: Cardiology

## 2019-01-16 ENCOUNTER — Other Ambulatory Visit: Payer: Self-pay

## 2019-01-16 MED ORDER — SILDENAFIL CITRATE 100 MG PO TABS: 100.0000 mg | ORAL_TABLET | ORAL | 0 refills | Status: DC | PRN

## 2019-02-08 ENCOUNTER — Telehealth (INDEPENDENT_AMBULATORY_CARE_PROVIDER_SITE_OTHER): Payer: Self-pay

## 2019-02-08 ENCOUNTER — Telehealth: Payer: Self-pay | Admitting: Family Medicine

## 2019-02-08 DIAGNOSIS — I5042 Chronic combined systolic (congestive) and diastolic (congestive) heart failure: Secondary | ICD-10-CM

## 2019-02-08 MED ORDER — CARVEDILOL 6.25 MG PO TABS: 6.2500 mg | ORAL_TABLET | Freq: Two times a day (BID) | ORAL | 0 refills | Status: DC

## 2019-02-08 NOTE — Telephone Encounter (Signed)
Done

## 2019-02-08 NOTE — Telephone Encounter (Signed)
Austin Bright, CMA  

## 2019-02-08 NOTE — Telephone Encounter (Signed)
Patient is needing a refill on the following medication.   carvedilol (COREG) 6.25 MG tablet    [facility] - [place], [place] - [address] Phone:  [phone]  Fax:  [phone]

## 2019-02-14 ENCOUNTER — Other Ambulatory Visit: Payer: Self-pay | Admitting: Cardiology

## 2019-03-01 ENCOUNTER — Other Ambulatory Visit: Payer: Self-pay | Admitting: Family Medicine

## 2019-03-16 ENCOUNTER — Other Ambulatory Visit: Payer: Self-pay | Admitting: Cardiology

## 2019-03-24 ENCOUNTER — Other Ambulatory Visit (INDEPENDENT_AMBULATORY_CARE_PROVIDER_SITE_OTHER): Payer: Self-pay | Admitting: Family Medicine

## 2019-03-24 DIAGNOSIS — I5042 Chronic combined systolic (congestive) and diastolic (congestive) heart failure: Secondary | ICD-10-CM

## 2019-03-29 ENCOUNTER — Other Ambulatory Visit: Payer: Self-pay | Admitting: Family Medicine

## 2019-04-18 ENCOUNTER — Other Ambulatory Visit: Payer: Self-pay | Admitting: Cardiology

## 2019-04-28 ENCOUNTER — Other Ambulatory Visit: Payer: Self-pay | Admitting: Family Medicine

## 2019-05-17 ENCOUNTER — Ambulatory Visit (INDEPENDENT_AMBULATORY_CARE_PROVIDER_SITE_OTHER): Payer: Medicare PPO | Admitting: Family Medicine

## 2019-05-17 ENCOUNTER — Other Ambulatory Visit: Payer: Self-pay

## 2019-05-17 VITALS — BP 140/80 | HR 68 | Temp 97.7°F | Ht 70.0 in | Wt 203.4 lb

## 2019-05-17 DIAGNOSIS — I10 Essential (primary) hypertension: Secondary | ICD-10-CM

## 2019-05-17 DIAGNOSIS — M1000 Idiopathic gout, unspecified site: Secondary | ICD-10-CM

## 2019-05-17 DIAGNOSIS — R7309 Other abnormal glucose: Secondary | ICD-10-CM | POA: Diagnosis not present

## 2019-05-17 DIAGNOSIS — I2510 Atherosclerotic heart disease of native coronary artery without angina pectoris: Secondary | ICD-10-CM

## 2019-05-17 DIAGNOSIS — E785 Hyperlipidemia, unspecified: Secondary | ICD-10-CM | POA: Diagnosis not present

## 2019-05-17 MED ORDER — CARVEDILOL 3.125 MG PO TABS: 3.1250 mg | ORAL_TABLET | Freq: Two times a day (BID) | ORAL | 1 refills | Status: DC

## 2019-05-17 MED ORDER — ROSUVASTATIN CALCIUM 40 MG PO TABS: 40.0000 mg | ORAL_TABLET | Freq: Every day | ORAL | 1 refills | Status: DC

## 2019-05-17 MED ORDER — LISINOPRIL 20 MG PO TABS: 20.0000 mg | ORAL_TABLET | Freq: Every day | ORAL | 1 refills | Status: DC

## 2019-05-17 NOTE — Progress Notes (Signed)
Established Patient Office Visit  Subjective:  Patient ID: Austin Bright, male    DOB: June 21, 1945  Age: 74 y.o. MRN: [id number]  CC:  Chief Complaint  Patient presents with  . Follow-up    6 month f/u   HTN/Hyperlipidemia  HPI Austin Bright presents for HTN/CAD/Hyperlipidemia/CHF No concerns with CP/SOB /LE edema -MI in the past with stent placement-sees cardio-decrease in meds due to hypotension-1/2 coreg and 1/2 chlorthalidone. No recent gout flares. Pt with no recent use of Lasix.  Past Medical History:  Diagnosis Date  . Acute MI ([facility]) 08/2011  . Arthritis   . Bilateral pneumonia    Diagnosed after STEMI 08/2011  . CAD (coronary artery disease)    a. Diagnosed 08/2011 with anterior STEMI due to thrombotic occlusion of mid LAD s/p thrombectomy, PTCA, DES placement 08/23/11.  Marland Kitchen CHF (congestive heart failure) ([facility])   . Dyslipidemia   . Gout   . Hypertension   . Ischemic cardiomyopathy    a. Initial EF 35% by cath 08/23/11, improved to 40-45% by echo 08/25/11.  Marland Kitchen Shortness of breath     Past Surgical History:  Procedure Laterality Date  . carpel tunnel Bilateral 2002  . COLONOSCOPY WITH PROPOFOL N/A 04/08/2017   Procedure: COLONOSCOPY WITH PROPOFOL;  Surgeon: Corbin Ade, MD;  [facility]: [facility];  Service: Endoscopy;  Laterality: N/A;  11:15am  . CORONARY STENT PLACEMENT    . CYSTECTOMY  1969   pilonidal cyst  . LEFT AND RIGHT HEART CATHETERIZATION WITH CORONARY ANGIOGRAM N/A 09/15/2011   Procedure: LEFT AND RIGHT HEART CATHETERIZATION WITH CORONARY ANGIOGRAM;  Surgeon: Herby Abraham, MD;  Location: [facility] CATH LAB;  Service: Cardiovascular;  Laterality: N/A;  . LEFT HEART CATH N/A 08/23/2011   Procedure: LEFT HEART CATH;  Surgeon: Iran Ouch, MD;  Location: [facility] CATH LAB;  Service: Cardiovascular;  Laterality: N/A;  . NO PAST SURGERIES    . PERCUTANEOUS CORONARY STENT INTERVENTION (PCI-S)  08/23/2011   Procedure: PERCUTANEOUS CORONARY STENT INTERVENTION  (PCI-S);  Surgeon: Iran Ouch, MD;  Location: [facility] CATH LAB;  Service: Cardiovascular;;    Family History  Problem Relation Age of Onset  . Hypertension Father   . Stroke Mother   . Colon cancer Neg Hx     Social History   Socioeconomic History  . Marital status: Widowed    Spouse name: Not on file  . Number of children: Not on file  . Years of education: Not on file  . Highest education level: Not on file  Occupational History  . Occupation: Child psychotherapist  Tobacco Use  . Smoking status: Former Smoker    Packs/day: 1.00    Years: 25.00    Pack years: 25.00    Types: Cigarettes    Quit date: 08/23/1979    Years since quitting: 39.7  . Smokeless tobacco: Never Used  Substance and Sexual Activity  . Alcohol use: Yes    Alcohol/week: 4.0 standard drinks    Types: 2 Cans of beer, 2 Shots of liquor per week    Comment: Weekends up to 3 times a week; 2-3 drinks per sitting.  . Drug use: No  . Sexual activity: Yes  Other Topics Concern  . Not on file  Social History Narrative   Pt lives in [place], is out of work till [employer] 2013 after MI. He is an only child.   Social Determinants of Health   Financial Resource Strain:   . Difficulty  of Paying Living Expenses:   Food Insecurity:   . Worried About Programme researcher, broadcasting/film/video in the Last Year:   . Barista in the Last Year:   Transportation Needs:   . Freight forwarder (Medical):   Marland Kitchen Lack of Transportation (Non-Medical):   Physical Activity:   . Days of Exercise per Week:   . Minutes of Exercise per Session:   Stress:   . Feeling of Stress :   Social Connections:   . Frequency of Communication with Friends and Family:   . Frequency of Social Gatherings with Friends and Family:   . Attends Religious Services:   . Active Member of Clubs or Organizations:   . Attends Banker Meetings:   Marland Kitchen Marital Status:   Intimate Partner Violence:   . Fear of Current or Ex-Partner:   . Emotionally  Abused:   Marland Kitchen Physically Abused:   . Sexually Abused:     Outpatient Medications Prior to Visit  Medication Sig Dispense Refill  . allopurinol (ZYLOPRIM) 300 MG tablet TAKE 1 TABLET BY MOUTH ONCE DAILY FOR GOUT. 30 tablet 0  . Alpha Lipoic Acid 200 MG CAPS Take 200 mg by mouth daily.     Marland Kitchen aspirin EC 81 MG tablet Take 81 mg by mouth daily.    . carvedilol (COREG) 6.25 MG tablet TAKE (1) TABLET BY MOUTH TWICE DAILY WITH MEALS. 30 tablet 5  . chlorthalidone (HYGROTON) 25 MG tablet Take 0.5 tablets (12.5 mg total) by mouth daily. 45 tablet 6  . CINNAMON PO Take 1 capsule by mouth daily.    . Coenzyme Q10 (CO Q-10) 100 MG CAPS Take 100 mg by mouth daily.     . furosemide (LASIX) 20 MG tablet TAKE ONE TABLET BY MOUTH DAILY AS NEEDED. (TAKE IF WEIGHT INCREASES BY 2 LBS.) 30 tablet 3  . Green Tea 150 MG CAPS Take 150 mg by mouth daily.     Marland Kitchen lisinopril (PRINIVIL,ZESTRIL) 40 MG tablet Take 40 mg by mouth daily.    . Multiple Vitamins-Minerals (CENTRUM SILVER 50+MEN PO) Take 1 tablet by mouth daily.    . nitroGLYCERIN (NITROSTAT) 0.4 MG SL tablet PLACE 1 TAB UNDER TONGUE EVERY 5 MIN IF NEEDED FOR CHEST PAIN. MAY USE 3 TIMES.NO RELIEF CALL 911. 25 tablet 0  . RESVERATROL 100 MG CAPS Take 100 mg by mouth daily.     . rosuvastatin (CRESTOR) 40 MG tablet TAKE ONE TABLET BY MOUTH ONCE DAILY. 90 tablet 3  . sildenafil (VIAGRA) 100 MG tablet TAKE (1) TABLET BY MOUTH AS NEEDED FOR ERECTILE DYSFUNCTION. 4 tablet 0  . Turmeric 500 MG CAPS Take 500 mg by mouth daily.     . vitamin C (ASCORBIC ACID) 500 MG tablet Take 500 mg by mouth daily.     No facility-administered medications prior to visit.    No Known Allergies  ROS Review of Systems  Constitutional: Negative.   Eyes: Negative.   Respiratory: Negative.   Cardiovascular: Negative.   Gastrointestinal: Negative.   Endocrine: Negative.   Genitourinary: Negative.   Musculoskeletal: Negative.   Skin:       appt with derm  Allergic/Immunologic:  Negative.   Neurological: Negative.   Psychiatric/Behavioral: Negative.       Objective:    Physical Exam  Constitutional: He is oriented to person, place, and time. He appears well-developed and well-nourished.  HENT:  Head: Normocephalic and atraumatic.  Eyes: Conjunctivae are normal.  Cardiovascular: Normal rate, regular  rhythm, normal heart sounds and intact distal pulses.  Pulmonary/Chest: Effort normal and breath sounds normal.  Neurological: He is alert and oriented to person, place, and time.  Psychiatric: He has a normal mood and affect. His behavior is normal.    BP 140/80 (BP Location: Right Arm, Patient Position: Sitting, Cuff Size: Large)   Pulse 68   Temp 97.7 F (36.5 C) (Temporal)   Ht 5\' 10"  (1.778 m)   Wt 203 lb 6.4 oz (92.3 kg)   SpO2 97%   BMI 29.18 kg/m  Wt Readings from Last 3 Encounters:  05/17/19 203 lb 6.4 oz (92.3 kg)  11/16/18 197 lb 12.8 oz (89.7 kg)  08/25/18 195 lb (88.5 kg)     Health Maintenance Due  Topic Date Due  . Hepatitis C Screening  Never done  . COVID-19 Vaccine (1) Never done    Lab Results  Component Value Date   TSH 1.184 10/21/2018   Lab Results  Component Value Date   WBC 7.2 10/21/2018   HGB 16.7 10/21/2018   HCT 49.4 10/21/2018   MCV 95.2 10/21/2018   PLT 164 10/21/2018   Lab Results  Component Value Date   NA 136 10/21/2018   K 3.8 10/21/2018   CO2 28 10/21/2018   GLUCOSE 108 (H) 10/21/2018   BUN 25 (H) 10/21/2018   CREATININE 1.04 10/21/2018   BILITOT 0.7 10/21/2018   ALKPHOS 76 10/21/2018   AST 23 10/21/2018   ALT 19 10/21/2018   PROT 7.7 10/21/2018   ALBUMIN 4.5 10/21/2018   CALCIUM 9.1 10/21/2018   ANIONGAP 12 10/21/2018   Lab Results  Component Value Date   CHOL 147 10/21/2018   Lab Results  Component Value Date   HDL 55 10/21/2018   Lab Results  Component Value Date   LDLCALC 75 10/21/2018   Lab Results  Component Value Date   TRIG 86 10/21/2018   Lab Results  Component Value  Date   CHOLHDL 2.7 10/21/2018   Lab Results  Component Value Date   HGBA1C 5.4 08/23/2011      Assessment & Plan:  1. Dyslipidemia CoQ10, green tea, cinnamon  2. Coronary artery disease involving native coronary artery of native heart without angina pectoris - Basic metabolic panel No CP, no LE edema-no need for lasix recently 3. Idiopathic gout, unspecified chronicity, unspecified site Allopurinol-no flares 4. Elevated glucose Noted on blood work-no h/o DM - Hemoglobin A1c  5. Essential hypertension Decrease in dose due to hypotension - Basic metabolic panel  Follow-up: 6 months-primary care, derm, cardiology   Naiyah Klostermann Hannah Beat, MD

## 2019-05-17 NOTE — Patient Instructions (Addendum)
  labwork  If you have lab work done today you will be contacted with your lab results within the next 2 weeks.  If you have not heard from [place] then please contact us. The fastest way to get your results is to register for My Chart.   IF you received an x-ray today, you will receive an invoice from [facility]. Please contact [facility] at [phone] with questions or concerns regarding your invoice.   IF you received labwork today, you will receive an invoice from [facility]. Please contact [facility] at [phone] with questions or concerns regarding your invoice.   Our billing staff will not be able to assist you with questions regarding bills from these companies.  You will be contacted with the lab results as soon as they are available. The fastest way to get your results is to activate your My Chart account. Instructions are located on the last page of this paperwork. If you have not heard from [place] regarding the results in 2 weeks, please contact this office.

## 2019-05-18 ENCOUNTER — Encounter: Payer: Self-pay | Admitting: Emergency Medicine

## 2019-05-18 LAB — HEMOGLOBIN A1C
Hgb A1c MFr Bld: 5.2 % of total Hgb (ref <5.7)
Mean Plasma Glucose: 103 (calc)
eAG (mmol/L): 5.7 (calc)

## 2019-05-18 LAB — BASIC METABOLIC PANEL
BUN: 22 mg/dL (ref 7–25)
CO2: 29 mmol/L (ref 20–32)
Calcium: 9.3 mg/dL (ref 8.6–10.3)
Chloride: 104 mmol/L (ref 98–110)
Creat: 0.98 mg/dL (ref 0.70–1.18)
Glucose, Bld: 103 mg/dL (ref 65–139)
Potassium: 3.9 mmol/L (ref 3.5–5.3)
Sodium: 141 mmol/L (ref 135–146)

## 2019-05-18 NOTE — Progress Notes (Signed)
Lab letter mailed. 

## 2019-05-31 ENCOUNTER — Other Ambulatory Visit: Payer: Self-pay

## 2019-05-31 ENCOUNTER — Ambulatory Visit (INDEPENDENT_AMBULATORY_CARE_PROVIDER_SITE_OTHER): Payer: Medicare PPO | Admitting: Internal Medicine

## 2019-05-31 ENCOUNTER — Encounter (INDEPENDENT_AMBULATORY_CARE_PROVIDER_SITE_OTHER): Payer: Self-pay | Admitting: Internal Medicine

## 2019-05-31 VITALS — BP 126/82 | HR 71 | Temp 97.9°F | Resp 19 | Ht 70.0 in | Wt 203.8 lb

## 2019-05-31 DIAGNOSIS — I5042 Chronic combined systolic (congestive) and diastolic (congestive) heart failure: Secondary | ICD-10-CM | POA: Diagnosis not present

## 2019-05-31 DIAGNOSIS — M1000 Idiopathic gout, unspecified site: Secondary | ICD-10-CM | POA: Diagnosis not present

## 2019-05-31 DIAGNOSIS — N5201 Erectile dysfunction due to arterial insufficiency: Secondary | ICD-10-CM

## 2019-05-31 DIAGNOSIS — E785 Hyperlipidemia, unspecified: Secondary | ICD-10-CM | POA: Diagnosis not present

## 2019-05-31 DIAGNOSIS — I2510 Atherosclerotic heart disease of native coronary artery without angina pectoris: Secondary | ICD-10-CM | POA: Diagnosis not present

## 2019-05-31 DIAGNOSIS — Z125 Encounter for screening for malignant neoplasm of prostate: Secondary | ICD-10-CM

## 2019-05-31 NOTE — Progress Notes (Signed)
Metrics: Intervention Frequency ACO  Documented Smoking Status Yearly  Screened one or more times in 24 months  Cessation Counseling or  Active cessation medication Past 24 months  Past 24 months   Guideline developer: UpToDate (See UpToDate for funding source) Date Released: 2014       Wellness Office Visit  Subjective:  Patient ID: Austin Bright, male    DOB: 03/08/1945  Age: 74 y.o. MRN: [id number]  CC: This 74 year old very pleasant man comes in to establish care.  His previous physician is leaving the practice.  He used to be a patient of Dr. Kari Baars, who is now retired. HPI  He has a history of coronary artery disease, having had a myocardial infarction in 2013 but he has done extremely well since this time.  He had 1 episode of congestive heart failure according to his history and his ejection fraction has improved over time.  He sees cardiology on a regular basis. His main troubles appear to be erectile dysfunction at the present time.  He has a girlfriend of the last 3 years and this apparently is becoming an issue with the relationship. He walks on the treadmill 6 days a week, tries to keep active and tries to eat healthy. Past Medical History:  Diagnosis Date  . Acute MI (HCC) 08/2011  . Arthritis   . Bilateral pneumonia    Diagnosed after STEMI 08/2011  . CAD (coronary artery disease)    a. Diagnosed 08/2011 with anterior STEMI due to thrombotic occlusion of mid LAD s/p thrombectomy, PTCA, DES placement 08/23/11.  Marland Kitchen CHF (congestive heart failure) (HCC)   . Dyslipidemia   . Gout   . Hypertension   . Ischemic cardiomyopathy    a. Initial EF 35% by cath 08/23/11, improved to 40-45% by echo 08/25/11.  Marland Kitchen Shortness of breath       Family History  Problem Relation Age of Onset  . Hypertension Father   . Stroke Mother   . 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   Social History   Tobacco Use  . Smoking status: Former Smoker    Packs/day: 1.00    Years: 25.00    Pack years: 25.00    Types: Cigarettes    Quit date: 08/23/1979    Years since quitting: 39.7  . Smokeless tobacco: Never Used  Substance Use Topics  . Alcohol use: Yes    Alcohol/week: 4.0 standard drinks    Types: 2 Cans of beer, 2 Shots of liquor per week    Comment: Weekends up to 3 times a week; 2-3 drinks per sitting.    Current Meds  Medication Sig  . allopurinol (ZYLOPRIM) 300 MG tablet TAKE 1 TABLET BY MOUTH ONCE DAILY FOR GOUT.  Marland Kitchen Alpha Lipoic Acid 200 MG CAPS Take 200 mg by mouth daily.   Marland Kitchen aspirin EC 81 MG tablet Take 81 mg by mouth daily.  . carvedilol (COREG) 3.125 MG tablet Take 1 tablet (3.125 mg total) by mouth 2 (two) times daily with a meal.  . chlorthalidone (HYGROTON) 25 MG tablet Take 0.5 tablets (12.5 mg total) by mouth daily.  Marland Kitchen CINNAMON PO Take 1 capsule by mouth daily.  . Coenzyme Q10 (CO Q-10) 100 MG CAPS Take 100 mg by mouth daily.   . furosemide (LASIX) 20 MG tablet TAKE ONE TABLET  BY MOUTH DAILY AS NEEDED. (TAKE IF WEIGHT INCREASES BY 2 LBS.)  . Green Tea 150 MG CAPS Take 150 mg by mouth daily.   Marland Kitchen lisinopril (ZESTRIL) 20 MG tablet Take 1 tablet (20 mg total) by mouth daily.  . Multiple Vitamins-Minerals (CENTRUM SILVER 50+MEN PO) Take 1 tablet by mouth daily.  . nitroGLYCERIN (NITROSTAT) 0.4 MG SL tablet PLACE 1 TAB UNDER TONGUE EVERY 5 MIN IF NEEDED FOR CHEST PAIN. MAY USE 3 TIMES.NO RELIEF CALL 911.  Marland Kitchen RESVERATROL 100 MG CAPS Take 100 mg by mouth daily.   . rosuvastatin (CRESTOR) 40 MG tablet Take 1 tablet (40 mg total) by mouth daily.  . sildenafil (VIAGRA) 100 MG tablet TAKE (1) TABLET BY MOUTH AS NEEDED FOR ERECTILE DYSFUNCTION.  . Turmeric 500 MG CAPS Take 500 mg by mouth daily.   . vitamin C (ASCORBIC ACID) 500 MG tablet Take 500 mg by mouth daily.       Objective:    Today's Vitals: BP 126/82 (BP Location: Right Arm, Patient Position: Sitting, Cuff Size: Normal)   Pulse 71   Temp 97.9 F (36.6 C) (Temporal)   Resp 19   Ht 5\' 10"  (1.778 m)   Wt 203 lb 12.8 oz (92.4 kg) Comment: 198lb w/o clothes  SpO2 97%   BMI 29.24 kg/m  Vitals with BMI 05/31/2019 05/17/2019 11/16/2018  Height 5\' 10"  5\' 10"  5\' 10"   Weight 203 lbs 13 oz 203 lbs 6 oz 197 lbs 13 oz  BMI 29.24 90.24 09.73  Systolic 532 992 426  Diastolic 82 80 74  Pulse 71 68 77     Physical Exam  He looks systemically well.  Blood pressure is good for his age.  BMI indicates that he is overweight.  Alert and orientated without any focal neurological signs.     Assessment   1. Coronary artery disease involving native coronary artery of native heart without angina pectoris   2. Chronic combined systolic and diastolic congestive heart failure ([place])   3. Dyslipidemia   4. Idiopathic gout, unspecified chronicity, unspecified site   5. Erectile dysfunction due to arterial insufficiency   6. Special screening for malignant neoplasm of prostate       Tests ordered Orders Placed This Encounter  Procedures  . Cardiopulmonary exercise test  . PSA  . Testosterone Total,Free,Bio, Males     Plan: 1. Blood tests are ordered for testosterone and PSA. 2. I would like to see his exercise tolerance in view of his coronary artery disease and previous history of congestive heart failure and I will order a VO2 max test on him. 3. I will see him in about a month's time with all these results available and we can discuss further. 4. Today I spent 45 minutes with this patient reviewing his medical records and discussing with him his concerns.  We also discussed the philosophy of our practice here.   No orders of the defined types were placed in this encounter.   Doree Albee, MD

## 2019-06-01 LAB — PSA: PSA: 2.1 ng/mL (ref <=4.0)

## 2019-06-01 LAB — TESTOSTERONE TOTAL,FREE,BIO, MALES
Albumin: 4.4 g/dL (ref 3.6–5.1)
Sex Hormone Binding: 41 nmol/L (ref 22–77)
Testosterone, Bioavailable: 83.8 ng/dL (ref 15.0–150.0)
Testosterone, Free: 41.6 pg/mL (ref 6.0–73.0)
Testosterone: 382 ng/dL (ref 250–827)

## 2019-06-02 ENCOUNTER — Telehealth (HOSPITAL_COMMUNITY): Payer: Self-pay | Admitting: *Deleted

## 2019-06-02 NOTE — Telephone Encounter (Signed)
Contacted patient per order for Cardiopulmonary Exercise Test. Patient unavailable, no answer or voicemail available. Patient can call [phone] to schedule CPX.  Will continue to try to schedule patient.   Lesia Hausen, MS, ACSM-RCEP Clinical Exercise Physiologist

## 2019-06-02 NOTE — Telephone Encounter (Signed)
Erroneous entry

## 2019-06-07 ENCOUNTER — Other Ambulatory Visit: Payer: Self-pay | Admitting: Family Medicine

## 2019-06-07 ENCOUNTER — Other Ambulatory Visit: Payer: Self-pay | Admitting: Cardiology

## 2019-06-07 ENCOUNTER — Other Ambulatory Visit (INDEPENDENT_AMBULATORY_CARE_PROVIDER_SITE_OTHER): Payer: Self-pay | Admitting: Internal Medicine

## 2019-06-27 ENCOUNTER — Ambulatory Visit (INDEPENDENT_AMBULATORY_CARE_PROVIDER_SITE_OTHER): Payer: Medicare PPO | Admitting: Internal Medicine

## 2019-07-07 ENCOUNTER — Other Ambulatory Visit: Payer: Self-pay | Admitting: Cardiology

## 2019-07-28 ENCOUNTER — Other Ambulatory Visit: Payer: Self-pay

## 2019-07-28 ENCOUNTER — Other Ambulatory Visit (HOSPITAL_COMMUNITY)
Admission: RE | Admit: 2019-07-28 | Discharge: 2019-07-28 | Disposition: A | Discharge status: Home / Self Care | Payer: Medicare PPO | Source: Ambulatory Visit | Attending: Internal Medicine | Admitting: Internal Medicine

## 2019-07-28 DIAGNOSIS — Z20822 Contact with and (suspected) exposure to covid-19: Secondary | ICD-10-CM | POA: Insufficient documentation

## 2019-07-28 DIAGNOSIS — Z01812 Encounter for preprocedural laboratory examination: Secondary | ICD-10-CM | POA: Insufficient documentation

## 2019-07-28 LAB — SARS CORONAVIRUS 2 (TAT 6-24 HRS): SARS Coronavirus 2: NEGATIVE

## 2019-07-31 ENCOUNTER — Other Ambulatory Visit: Payer: Self-pay

## 2019-07-31 ENCOUNTER — Other Ambulatory Visit (HOSPITAL_COMMUNITY): Payer: Self-pay | Admitting: *Deleted

## 2019-07-31 ENCOUNTER — Ambulatory Visit (HOSPITAL_COMMUNITY): Payer: Medicare PPO | Attending: Internal Medicine

## 2019-07-31 DIAGNOSIS — I5042 Chronic combined systolic (congestive) and diastolic (congestive) heart failure: Secondary | ICD-10-CM | POA: Diagnosis not present

## 2019-07-31 DIAGNOSIS — I2510 Atherosclerotic heart disease of native coronary artery without angina pectoris: Secondary | ICD-10-CM | POA: Insufficient documentation

## 2019-08-09 ENCOUNTER — Other Ambulatory Visit: Payer: Self-pay

## 2019-08-09 ENCOUNTER — Encounter (INDEPENDENT_AMBULATORY_CARE_PROVIDER_SITE_OTHER): Payer: Self-pay | Admitting: Internal Medicine

## 2019-08-09 ENCOUNTER — Ambulatory Visit (INDEPENDENT_AMBULATORY_CARE_PROVIDER_SITE_OTHER): Payer: Medicare PPO | Admitting: Internal Medicine

## 2019-08-09 VITALS — BP 118/66 | HR 63 | Temp 97.6°F | Resp 18 | Ht 70.0 in | Wt 209.4 lb

## 2019-08-09 DIAGNOSIS — N5201 Erectile dysfunction due to arterial insufficiency: Secondary | ICD-10-CM | POA: Diagnosis not present

## 2019-08-09 DIAGNOSIS — I2510 Atherosclerotic heart disease of native coronary artery without angina pectoris: Secondary | ICD-10-CM

## 2019-08-09 MED ORDER — TESTOSTERONE 20 % CREA: 100.0000 mg | TOPICAL_CREAM | Freq: Two times a day (BID) | 0 refills | Status: DC

## 2019-08-09 NOTE — Patient Instructions (Addendum)
[facility] Dietary Recommendations for Weight Loss What to Avoid . Avoid added sugars o Often added sugar can be found in processed foods such as many condiments, dry cereals, cakes, cookies, chips, crisps, crackers, candies, sweetened drinks, etc.  o Read labels and AVOID/DECREASE use of foods with the following in their ingredient list: Sugar, fructose, high fructose corn syrup, sucrose, glucose, maltose, dextrose, molasses, cane sugar, brown sugar, any type of syrup, agave nectar, etc.   . Avoid snacking in between meals . Avoid foods made with flour o If you are going to eat food made with flour, choose those made with whole-grains; and, minimize your consumption as much as is tolerable . Avoid processed foods o These foods are generally stocked in the middle of the grocery store. Focus on shopping on the perimeter of the grocery.  . Avoid Meat  o We recommend following a plant-based diet at [name]. Thus, we recommend avoiding meat as a general rule. Consider eating beans, legumes, eggs, and/or dairy products for regular protein sources o If you plan on eating meat limit to 4 ounces of meat at a time and choose lean options such as Fish, chicken, turkey. Avoid red meat intake such as pork and/or steak What to Include . Vegetables o GREEN LEAFY VEGETABLES: Kale, spinach, mustard greens, collard greens, cabbage, broccoli, etc. o OTHER: Asparagus, cauliflower, eggplant, carrots, peas, Brussel sprouts, tomatoes, bell peppers, zucchini, beets, cucumbers, etc. . Grains, seeds, and legumes o Beans: kidney beans, black eyed peas, garbanzo beans, black beans, pinto beans, etc. o Whole, unrefined grains: brown rice, barley, bulgur, oatmeal, etc. . Healthy fats  o Avoid highly processed fats such as vegetable oil o Examples of healthy fats: avocado, olives, virgin olive oil, dark chocolate (?72% Cocoa), nuts (peanuts, almonds, walnuts, cashews, pecans, etc.) . None to Low  Intake of Animal Sources of Protein o Meat sources: chicken, turkey, salmon, tuna. Limit to 4 ounces of meat at one time. o Consider limiting dairy sources, but when choosing dairy focus on: PLAIN Greek yogurt, cottage cheese, high-protein milk . Fruit o Choose berries  When to Eat . Intermittent Fasting: o Choosing not to eat for a specific time period, but DO FOCUS ON HYDRATION when fasting o Multiple Techniques: - Time Restricted Eating: eat 3 meals in a day, each meal lasting no more than 60 minutes, no snacks between meals - 16-18 hour fast: fast for 16 to 18 hours up to 7 days a week. Often suggested to start with 2-3 nonconsecutive days per week.  . Remember the time you sleep is counted as fasting.  . Examples of eating schedule: Fast from 7:00pm-11:00am. Eat between 11:00am-7:00pm.  - 24-hour fast: fast for 24 hours up to every other day. Often suggested to start with 1 day per week . Remember the time you sleep is counted as fasting . Examples of eating schedule:  o Eating day: eat 2-3 meals on your eating day. If doing 2 meals, each meal should last no more than 90 minutes. If doing 3 meals, each meal should last no more than 60 minutes. Finish last meal by 7:00pm. o Fasting day: Fast until 7:00pm.  o IF YOU FEEL UNWELL FOR ANY REASON/IN ANY WAY WHEN FASTING, STOP FASTING BY EATING A NUTRITIOUS SNACK OR LIGHT MEAL o ALWAYS FOCUS ON HYDRATION DURING FASTS - Acceptable Hydration sources: water, broths, tea/coffee (black tea/coffee is best but using a small amount of whole-fat dairy products in coffee/tea is acceptable).  -   Poor Hydration Sources: anything with sugar or artificial sweeteners added to it  These recommendations have been developed for patients that are actively receiving medical care from either Dr. Karilyn Cota or Jiles Prows, DNP, NP-C at [facility]. These recommendations are developed for patients with specific medical conditions and are not meant to be  distributed or used by others that are not actively receiving care from either provider listed above at [facility]. It is not appropriate to participate in the above eating plans without proper medical supervision.   Reference: Lawrence Santiago. The obesity code. [place]; 2016.    Exercise prescription:   1.  Aerobic training.  5 to 10 minutes warm up. 15 minutes maintaining heart rate at 120/min. 5-minute cool down.   2.  High intensity interval training.  5 to 10 minutes warm up. 30 seconds at heart rate of 135/min. 1 to 2-minute recovery, maintaining heart rate at or above 120/min. Repeat the above 3 further times. 5-minute cool down. As you become fitter, increase the intervals by 15 seconds each time and eventually achieve 1 minute intervals.  I would recommend that you do cardiovascular exercise above 3 times a week.  Of course, as we discussed, combined strength training together with this.

## 2019-08-09 NOTE — Progress Notes (Signed)
Metrics: Intervention Frequency ACO  Documented Smoking Status Yearly  Screened one or more times in 24 months  Cessation Counseling or  Active cessation medication Past 24 months  Past 24 months   Guideline developer: UpToDate (See UpToDate for funding source) Date Released: 2014       Wellness Office Visit  Subjective:  Patient ID: Austin Bright, male    DOB: 17-Jun-1945  Age: 74 y.o. MRN: [id number]  CC: This man comes in for follow-up to discuss his blood work and VO2 max testing. HPI His testosterone levels are suboptimal.  PSA is normal.  VO2 max is normal for his age and his RER 1.1 was achieved at a heart rate of 135/min.  Past Medical History:  Diagnosis Date  . Acute MI (HCC) 08/2011  . Arthritis   . Bilateral pneumonia    Diagnosed after STEMI 08/2011  . CAD (coronary artery disease)    a. Diagnosed 08/2011 with anterior STEMI due to thrombotic occlusion of mid LAD s/p thrombectomy, PTCA, DES placement 08/23/11.  Marland Kitchen CHF (congestive heart failure) (HCC)   . Dyslipidemia   . Gout   . Hypertension   . Ischemic cardiomyopathy    a. Initial EF 35% by cath 08/23/11, improved to 40-45% by echo 08/25/11.  Marland Kitchen Shortness of breath    Past Surgical History:  Procedure Laterality Date  . carpel tunnel Bilateral 2002  . COLONOSCOPY WITH PROPOFOL N/A 04/08/2017   Procedure: COLONOSCOPY WITH PROPOFOL;  Surgeon: Corbin Ade, MD;  [facility];  Service: Endoscopy;  Laterality: N/A;  11:15am  . CORONARY STENT PLACEMENT    . CYSTECTOMY  1969   pilonidal cyst  . LEFT AND RIGHT HEART CATHETERIZATION WITH CORONARY ANGIOGRAM N/A 09/15/2011   Procedure: LEFT AND RIGHT HEART CATHETERIZATION WITH CORONARY ANGIOGRAM;  Surgeon: Herby Abraham, MD;  [facility] C[facility] LAB;  Service: Cardiovascular;  Laterality: N/A;  . LEFT HEART CATH N/A 08/23/2011   Procedure: LEFT HEART CATH;  Surgeon: Iran Ouch, MD;  Location: [facility] CATH LAB;  Service: Cardiovascular;  Laterality: N/A;  . NO  PAST SURGERIES    . PERCUTANEOUS CORONARY STENT INTERVENTION (PCI-S)  08/23/2011   Procedure: PERCUTANEOUS CORONARY STENT INTERVENTION (PCI-S);  Surgeon: Iran Ouch, MD;  Location: [facility] CATH LAB;  Service: Cardiovascular;;     Family History  Problem Relation Age of Onset  . Hypertension Father   . Stroke Mother   . 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   Social History   Tobacco Use  . Smoking status: Former Smoker    Packs/day: 1.00    Years: 25.00    Pack years: 25.00    Types: Cigarettes    Quit date: 08/23/1979    Years since quitting: 39.9  . Smokeless tobacco: Never Used  Substance Use Topics  . Alcohol use: Yes    Alcohol/week: 4.0 standard drinks    Types: 2 Cans of beer, 2 Shots of liquor per week    Comment: Weekends up to 3 times a week; 2-3 drinks per sitting.    Current Meds  Medication Sig  . allopurinol (ZYLOPRIM) 300 MG tablet TAKE 1 TABLET BY MOUTH ONCE DAILY FOR GOUT.  Marland Kitchen Alpha Lipoic Acid 200 MG CAPS Take 200 mg by mouth daily.   Marland Kitchen aspirin EC 81 MG tablet Take 81  mg by mouth daily.  . carvedilol (COREG) 3.125 MG tablet Take 1 tablet (3.125 mg total) by mouth 2 (two) times daily with a meal.  . chlorthalidone (HYGROTON) 25 MG tablet Take 0.5 tablets (12.5 mg total) by mouth daily.  Marland Kitchen CINNAMON PO Take 1 capsule by mouth daily.  . Coenzyme Q10 (CO Q-10) 100 MG CAPS Take 100 mg by mouth daily.   . furosemide (LASIX) 20 MG tablet TAKE ONE TABLET BY MOUTH DAILY AS NEEDED. (TAKE IF WEIGHT INCREASES BY 2 LBS.)  . Green Tea 150 MG CAPS Take 150 mg by mouth daily.   Marland Kitchen lisinopril (ZESTRIL) 20 MG tablet Take 1 tablet (20 mg total) by mouth daily.  . Multiple Vitamins-Minerals (CENTRUM SILVER 50+MEN PO) Take 1 tablet by mouth daily.  . nitroGLYCERIN (NITROSTAT) 0.4 MG SL tablet PLACE 1 TAB UNDER TONGUE  EVERY 5 MIN IF NEEDED FOR CHEST PAIN. MAY USE 3 TIMES.NO RELIEF CALL 911.  Marland Kitchen RESVERATROL 100 MG CAPS Take 100 mg by mouth daily.   . rosuvastatin (CRESTOR) 40 MG tablet Take 1 tablet (40 mg total) by mouth daily.  . sildenafil (VIAGRA) 100 MG tablet TAKE (1) TABLET BY MOUTH AS NEEDED FOR ERECTILE DYSFUNCTION.  . Turmeric 500 MG CAPS Take 500 mg by mouth daily.   . vitamin C (ASCORBIC ACID) 500 MG tablet Take 500 mg by mouth daily.      Depression screen [facility] 2/9 05/17/2019 09/24/2011  Decreased Interest 0 0  Down, Depressed, Hopeless 0 0  PHQ - 2 Score 0 0     Objective:   Today's Vitals: BP 118/66 (BP Location: Right Arm, Patient Position: Sitting, Cuff Size: Normal)   Pulse 63   Temp 97.6 F (36.4 C) (Temporal)   Resp 18   Ht 5\' 10"  (1.778 m)   Wt 209 lb 6.4 oz (95 kg) Comment: 205lb for home.  SpO2 97% Comment: n95 mask  BMI 30.05 kg/m  Vitals with BMI 08/09/2019 05/31/2019 05/17/2019  Height 5\' 10"  5\' 10"  5\' 10"   Weight 209 lbs 6 oz 203 lbs 13 oz 203 lbs 6 oz  BMI 30.05 29.24 29.18  Systolic 118 126 05/19/2019  Diastolic 66 82 80  Pulse 63 71 68     Physical Exam  He looks systemically well.  Remains obese.  No new physical findings.  Blood pressure is excellent.     Assessment   1. Coronary artery disease involving native coronary artery of native heart without angina pectoris   2. Erectile dysfunction due to arterial insufficiency       Tests ordered No orders of the defined types were placed in this encounter.    Plan: 1. I discussed his results in detail. 2. I discussed testosterone therapy in detail, FDA warnings, benefits and side effects and mode of administration.  He is agreeable to the plan and I have called in a prescription for testosterone cream to apothecary 100 mg applied to the scrotal skin twice a day. 3. We also discussed his VO2 max in detail and we discussed aerobic training as well as interval training and I will send him a prescription  for this. 4. Follow-up in about 2 months to see how he is doing.  Today I spent 40 minutes with this patient going over all his results and discussing testosterone therapy and exercise prescription.   Meds ordered this encounter  Medications  . Testosterone 20 % CREA    Sig: Apply 100 mg topically 2 (two) times  daily.    Dispense:  100 g    Refill:  0    Sandra Tellefsen Normajean Glasgow, MD

## 2019-08-10 ENCOUNTER — Other Ambulatory Visit: Payer: Self-pay | Admitting: Family Medicine

## 2019-09-06 ENCOUNTER — Other Ambulatory Visit (INDEPENDENT_AMBULATORY_CARE_PROVIDER_SITE_OTHER): Payer: Self-pay | Admitting: Internal Medicine

## 2019-09-06 ENCOUNTER — Other Ambulatory Visit: Payer: Self-pay | Admitting: Family Medicine

## 2019-09-07 ENCOUNTER — Other Ambulatory Visit: Payer: Self-pay

## 2019-09-07 ENCOUNTER — Encounter: Payer: Self-pay | Admitting: Student

## 2019-09-07 ENCOUNTER — Ambulatory Visit: Payer: Medicare PPO | Admitting: Student

## 2019-09-07 VITALS — BP 114/66 | HR 76 | Ht 70.0 in | Wt 219.6 lb

## 2019-09-07 DIAGNOSIS — E785 Hyperlipidemia, unspecified: Secondary | ICD-10-CM

## 2019-09-07 DIAGNOSIS — I2510 Atherosclerotic heart disease of native coronary artery without angina pectoris: Secondary | ICD-10-CM | POA: Diagnosis not present

## 2019-09-07 DIAGNOSIS — I10 Essential (primary) hypertension: Secondary | ICD-10-CM | POA: Diagnosis not present

## 2019-09-07 MED ORDER — METOPROLOL SUCCINATE ER 25 MG PO TB24
12.5000 mg | ORAL_TABLET | Freq: Every day | ORAL | 3 refills | Status: DC
Start: 2019-09-07 — End: 2019-11-21

## 2019-09-07 NOTE — Progress Notes (Signed)
Cardiology Office Note    Date:  09/07/2019   ID:  Austin Bright, DOB 1945/02/15, MRN [id number]  PCP:  Austin Singer, MD  Cardiologist: Austin Rich, MD    Chief Complaint  Patient presents with  . Follow-up    Annual Visit    History of Present Illness:    Austin Bright is a 74 y.o. male with past medical history of CAD (s/p DES to mid-LAD in 08/2011), HTN, HLD and prior tobacco use who presents to the office today for annual follow-up.  He was last examined by Dr. Wyline Bright in 08/2018 and denied any recent chest pain or dyspnea on exertion at that time. He was walking up to 2.5 miles each day without anginal symptoms. BP was above goal and he was restarted on Chlorthalidone 12.5 mg daily. He did undergo a cardiopulmonary exercise test and 07/2019 which showed normal functional capacity and no indication for acute cardiopulmonary abnormalities. Resting spirometry was suggestive of possibly mild obstructive lung disease.  In talking with the patient today, he reports overall doing well from a cardiac perspective since his last visit. He either walks outside or runs on the treadmill 5 days of the week and denies any anginal symptoms with this. No recent orthopnea, PND, lower extremity edema or palpitations. He does report issues with ED ever since being on Coreg.   Past Medical History:  Diagnosis Date  . Acute MI (HCC) 08/2011  . Arthritis   . Bilateral pneumonia    Diagnosed after STEMI 08/2011  . CAD (coronary artery disease)    a. Diagnosed 08/2011 with anterior STEMI due to thrombotic occlusion of mid LAD s/p thrombectomy, PTCA, DES placement 08/23/11.  Marland Kitchen CHF (congestive heart failure) (HCC)   . Dyslipidemia   . Gout   . Hypertension   . Ischemic cardiomyopathy    a. Initial EF 35% by cath 08/23/11, improved to 40-45% by echo 08/25/11.  Marland Kitchen Shortness of breath     Past Surgical History:  Procedure Laterality Date  . carpel tunnel Bilateral 2002  . COLONOSCOPY  WITH PROPOFOL N/A 04/08/2017   Procedure: COLONOSCOPY WITH PROPOFOL;  Surgeon: Corbin Ade, MD;  [facility];  Service: Endoscopy;  Laterality: N/A;  11:15am  . CORONARY STENT PLACEMENT    . CYSTECTOMY  1969   pilonidal cyst  . LEFT AND RIGHT HEART CATHETERIZATION WITH CORONARY ANGIOGRAM N/A 09/15/2011   Procedure: LEFT AND RIGHT HEART CATHETERIZATION WITH CORONARY ANGIOGRAM;  Surgeon: Herby Abraham, MD;  Location: [facility] CATH LAB;  Service: Cardiovascular;  Laterality: N/A;  . LEFT HEART CATH N/A 08/23/2011   Procedure: LEFT HEART CATH;  Surgeon: Iran Ouch, MD;  Location: [facility] CATH LAB;  Service: Cardiovascular;  Laterality: N/A;  . NO PAST SURGERIES    . PERCUTANEOUS CORONARY STENT INTERVENTION (PCI-S)  08/23/2011   Procedure: PERCUTANEOUS CORONARY STENT INTERVENTION (PCI-S);  Surgeon: Iran Ouch, MD;  Location: [facility] CATH LAB;  Service: Cardiovascular;;    Current Medications: Outpatient Medications Prior to Visit  Medication Sig Dispense Refill  . allopurinol (ZYLOPRIM) 300 MG tablet TAKE 1 TABLET BY MOUTH ONCE DAILY FOR GOUT. 90 tablet 1  . Alpha Lipoic Acid 200 MG CAPS Take 200 mg by mouth daily.     Marland Kitchen aspirin EC 81 MG tablet Take 81 mg by mouth daily.    . chlorthalidone (HYGROTON) 25 MG tablet Take 0.5 tablets (12.5 mg total) by mouth daily. 45 tablet 6  .  CINNAMON PO Take 1 capsule by mouth daily.    . Coenzyme Q10 (CO Q-10) 100 MG CAPS Take 100 mg by mouth daily.     Chilton Si Tea 150 MG CAPS Take 150 mg by mouth daily.     Marland Kitchen lisinopril (ZESTRIL) 20 MG tablet Take 1 tablet (20 mg total) by mouth daily. 90 tablet 1  . Multiple Vitamins-Minerals (CENTRUM SILVER 50+MEN PO) Take 1 tablet by mouth daily.    . nitroGLYCERIN (NITROSTAT) 0.4 MG SL tablet PLACE 1 TAB UNDER TONGUE EVERY 5 MIN IF NEEDED FOR CHEST PAIN. MAY USE 3 TIMES.NO RELIEF CALL 911. 25 tablet 0  . RESVERATROL 100 MG CAPS Take 100 mg by mouth daily.     . rosuvastatin (CRESTOR) 40 MG tablet Take 1 tablet  (40 mg total) by mouth daily. 90 tablet 1  . sildenafil (VIAGRA) 100 MG tablet TAKE (1) TABLET BY MOUTH AS NEEDED FOR ERECTILE DYSFUNCTION. 4 tablet 3  . Testosterone 20 % CREA Apply 100 mg topically 2 (two) times daily. 100 g 0  . Turmeric 500 MG CAPS Take 500 mg by mouth daily.     . vitamin C (ASCORBIC ACID) 500 MG tablet Take 500 mg by mouth daily.    . carvedilol (COREG) 3.125 MG tablet Take 1 tablet (3.125 mg total) by mouth 2 (two) times daily with a meal. 180 tablet 1  . furosemide (LASIX) 20 MG tablet TAKE ONE TABLET BY MOUTH DAILY AS NEEDED. (TAKE IF WEIGHT INCREASES BY 2 LBS.) 30 tablet 3   No facility-administered medications prior to visit.     Allergies:   Patient has no known allergies.   Social History   Socioeconomic History  . Marital status: Widowed    Spouse name: Not on file  . Number of children: Not on file  . Years of education: Not on file  . Highest education level: Not on file  Occupational History  . Occupation: Child psychotherapist  Tobacco Use  . Smoking status: Former Smoker    Packs/day: 1.00    Years: 25.00    Pack years: 25.00    Types: Cigarettes    Quit date: 08/23/1979    Years since quitting: 40.0  . Smokeless tobacco: Never Used  Vaping Use  . Vaping Use: Never used  Substance and Sexual Activity  . Alcohol use: Yes    Alcohol/week: 4.0 standard drinks    Types: 2 Cans of beer, 2 Shots of liquor per week    Comment: Weekends up to 3 times a week; 2-3 drinks per sitting.  . Drug use: No  . Sexual activity: Yes  Other Topics Concern  . 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   Social Determinants of Health   Financial Resource Strain:   . Difficulty of Paying Living Expenses: Not on file  Food Insecurity:   . Worried About Programme researcher, broadcasting/film/video in the Last Year: Not on file  . Ran Out of Food in the Last Year:  Not on file  Transportation Needs:   . Lack of Transportation (Medical): Not on file  . Lack of Transportation (Non-Medical): Not on file  Physical Activity:   . Days of Exercise per Week: Not on file  . Minutes of Exercise per Session: Not on file  Stress:   . Feeling of Stress : Not on file  Social Connections:   .  Frequency of Communication with Friends and Family: Not on file  . Frequency of Social Gatherings with Friends and Family: Not on file  . Attends Religious Services: Not on file  . Active Member of Clubs or Organizations: Not on file  . Attends BankerClub or Organization Meetings: Not on file  . Marital Status: Not on file     Family History:  The patient's family history includes Hypertension in his father; Stroke in his mother.   Review of Systems:   Please see the history of present illness.     General:  No chills, fever, night sweats or weight changes.  Cardiovascular:  No chest pain, dyspnea on exertion, edema, orthopnea, palpitations, paroxysmal nocturnal dyspnea. Positive for ED.  Dermatological: No rash, lesions/masses Respiratory: No cough, dyspnea Urologic: No hematuria, dysuria Abdominal:   No nausea, vomiting, diarrhea, bright red blood per rectum, melena, or hematemesis Neurologic:  No visual changes, wkns, changes in mental status. All other systems reviewed and are otherwise negative except as noted above.   Physical Exam:    VS:  BP 114/66   Pulse 76   Ht 5\' 10"  (1.778 m)   Wt 219 lb 9.6 oz (99.6 kg)   SpO2 97%   BMI 31.51 kg/m    General: Well developed, well nourished,male appearing in no acute distress. Head: Normocephalic, atraumatic. Neck: No carotid bruits. JVD not elevated.  Lungs: Respirations regular and unlabored, without wheezes or rales.  Heart: Regular rate and rhythm. No S3 or S4.  No murmur, no rubs, or gallops appreciated. Abdomen: Appears non-distended. No obvious abdominal masses. Msk:  Strength and tone appear normal for age.  No obvious joint deformities or effusions. Extremities: No clubbing or cyanosis. No edema.  Distal pedal pulses are 2+ bilaterally. Neuro: Alert and oriented X 3. Moves all extremities spontaneously. No focal deficits noted. Psych:  Responds to questions appropriately with a normal affect. Skin: No rashes or lesions noted  Wt Readings from Last 3 Encounters:  09/07/19 219 lb 9.6 oz (99.6 kg)  08/09/19 209 lb 6.4 oz (95 kg)  05/31/19 203 lb 12.8 oz (92.4 kg)     Studies/Labs Reviewed:   EKG:  EKG is ordered today.  The ekg ordered today demonstrates NSR, HR 81 with LAFB. No acute ST abnormalities when compared to prior tracings.   Recent Labs: 10/21/2018: ALT 19; Hemoglobin 16.7; Platelets 164; TSH 1.184 05/17/2019: BUN 22; Creat 0.98; Potassium 3.9; Sodium 141   Lipid Panel    Component Value Date/Time   CHOL 147 10/21/2018 0836   TRIG 86 10/21/2018 0836   HDL 55 10/21/2018 0836   CHOLHDL 2.7 10/21/2018 0836   VLDL 17 10/21/2018 0836   LDLCALC 75 10/21/2018 0836    Additional studies/ records that were reviewed today include:   Echocardiogram: 2013 Study Conclusions   - Left ventricle: The cavity size was normal. Wall thickness  was normal. Systolic function was normal. The estimated  ejection fraction was in the range of 50% to 55%. There is  hypokinesis of the mid-distalinferoseptal myocardium.  There is hypokinesis of the distalanteroseptal myocardium.  Features are consistent with a pseudonormal left  ventricular filling pattern, with concomitant abnormal  relaxation and increased filling pressure (grade 2  diastolic dysfunction). Doppler parameters are consistent  with elevated ventricular end-diastolic filling pressure.  - Aortic valve: Mildly calcified annulus. Trileaflet; mildly  calcified leaflets.  - Mitral valve: Trivial regurgitation.  - Left atrium: The atrium was mildly dilated.  - Right ventricle: The  cavity size was mildly dilated.    - Tricuspid valve: Trivial regurgitation.  - Pulmonary arteries: PA peak pressure: 95mm Hg (S).  - Pericardium, extracardiac: There was no pericardial  effusion.    Cardiopulmonary Exercise Test: 07/2019 Conclusion: Exercise teting with gas exchange demonstrates normal functional capacity when compared to matched sedentary norms. There is no indication for cardiopulmonary abnormality. Patient's anaerobic threshold occurred at 119bpm and can be utilized in interval training to improve cardiovascular fitness  Agree with above. Normal functional capacity. No significant cardiopulmonary limitation seen. Resting spirometry suggestive of mild obstructive lung disease. Suspect patient's body habitus is contributing to his exercise intolerance.  Assessment:    1. Coronary artery disease involving native coronary artery of native heart without angina pectoris   2. Essential hypertension   3. Hyperlipidemia LDL goal <70      Plan:   In order of problems listed above:  1. CAD - He is s/p DES to mid-LAD in 08/2011 and he did have an ICM at the time of his event but his EF has normalized in the interim. Recent cardiopulmonary exercise test in 07/2019 showed normal functional capacity and no indication for acute cardiopulmonary abnormalities and he continues to exercise several days each week without anginal symptoms.  - Continue ASA 81mg  daily and Crestor 40mg  daily. Given his issues with ED, will try switching Coreg to Toprol-XL 12.5mg  daily to see if this helps with his symptoms.   2. HTN - BP is well-controlled at 114/66 during today's visit. Continue Lisinopril 20mg  daily and Chlorthalidone 12.5mg  daily. Will switch Coreg to Toprol-XL as outlined above.   3. HLD - FLP in 10/2018 showed total cholesterol of 147, HDL 55 and LDL 75. Close to goal of LDL less than 70. Continue Crestor 40mg  daily.    Medication Adjustments/Labs and Tests Ordered: Current medicines are reviewed at length with  the patient today.  Concerns regarding medicines are outlined above.  Medication changes, Labs and Tests ordered today are listed in the Patient Instructions below. Patient Instructions  Medication Instructions:   STOP COREG.  START TOPROL-XL 12.5mg  DAILY.   Labwork:  NONE  Testing/Procedures:  NONE  Follow-Up:  With Dr. in 1 year.   Any Other Special Instructions Will Be Listed Below (If Applicable).     If you need a refill on your cardiac medications before your next appointment, please call your pharmacy.      Signed, , PA-C  09/07/2019 7:39 PM    [facility] [address] [place], [place] 09/09/2019 Phone: [phone] Fax: [phone]

## 2019-09-07 NOTE — Patient Instructions (Addendum)
Medication Instructions:   STOP COREG.  START TOPROL-XL 12.5mg  DAILY.   Labwork:  NONE  Testing/Procedures:  NONE  Follow-Up:  With Dr. Wyline Mood in 1 year.   Any Other Special Instructions Will Be Listed Below (If Applicable).     If you need a refill on your cardiac medications before your next appointment, please call your pharmacy.

## 2019-10-12 ENCOUNTER — Ambulatory Visit (INDEPENDENT_AMBULATORY_CARE_PROVIDER_SITE_OTHER): Payer: Medicare PPO | Admitting: Internal Medicine

## 2019-10-12 ENCOUNTER — Encounter (INDEPENDENT_AMBULATORY_CARE_PROVIDER_SITE_OTHER): Payer: Self-pay | Admitting: Internal Medicine

## 2019-10-12 ENCOUNTER — Other Ambulatory Visit: Payer: Self-pay

## 2019-10-12 VITALS — BP 138/76 | HR 90 | Temp 97.6°F | Resp 18 | Ht 70.0 in | Wt 213.7 lb

## 2019-10-12 DIAGNOSIS — I2510 Atherosclerotic heart disease of native coronary artery without angina pectoris: Secondary | ICD-10-CM | POA: Diagnosis not present

## 2019-10-12 DIAGNOSIS — R5381 Other malaise: Secondary | ICD-10-CM

## 2019-10-12 DIAGNOSIS — N5201 Erectile dysfunction due to arterial insufficiency: Secondary | ICD-10-CM | POA: Diagnosis not present

## 2019-10-12 DIAGNOSIS — R5383 Other fatigue: Secondary | ICD-10-CM | POA: Diagnosis not present

## 2019-10-12 DIAGNOSIS — E785 Hyperlipidemia, unspecified: Secondary | ICD-10-CM

## 2019-10-12 MED ORDER — TADALAFIL 10 MG PO TABS: 10.0000 mg | ORAL_TABLET | ORAL | 3 refills | Status: DC | PRN

## 2019-10-12 NOTE — Progress Notes (Signed)
Metrics: Intervention Frequency ACO  Documented Smoking Status Yearly  Screened one or more times in 24 months  Cessation Counseling or  Active cessation medication Past 24 months  Past 24 months   Guideline developer: UpToDate (See UpToDate for funding source) Date Released: 2014       Wellness Office Visit  Subjective:  Patient ID: Austin Bright, male    DOB: 05-Nov-1945  Age: 74 y.o. MRN: [id number]  CC: This man comes in for follow-up of testosterone therapy and erectile dysfunction. HPI  He feels that the testosterone cream has helped his erectile dysfunction, more energy, improved libido but he does notice that his sleep is not as good as it was previously.  He does take the second dose of the testosterone cream late at night. He tells me that although his erections are better, they do not last very long and he wonders whether there is anything else that can be done.  Viagra does tend to help obtain erections but not to maintain erections. He is now also on a ketogenic diet which seems to be helping him lose weight. He is trying to exercise more. Past Medical History:  Diagnosis Date  . Acute MI (HCC) 08/2011  . Arthritis   . Bilateral pneumonia    Diagnosed after STEMI 08/2011  . CAD (coronary artery disease)    a. Diagnosed 08/2011 with anterior STEMI due to thrombotic occlusion of mid LAD s/p thrombectomy, PTCA, DES placement 08/23/11.  Marland Kitchen CHF (congestive heart failure) (HCC)   . Dyslipidemia   . Gout   . Hypertension   . Ischemic cardiomyopathy    a. Initial EF 35% by cath 08/23/11, improved to 40-45% by echo 08/25/11.  Marland Kitchen Shortness of breath    Past Surgical History:  Procedure Laterality Date  . carpel tunnel Bilateral 2002  . COLONOSCOPY WITH PROPOFOL N/A 04/08/2017   Procedure: COLONOSCOPY WITH PROPOFOL;  Surgeon: Corbin Ade, MD;  [facility];  Service: Endoscopy;  Laterality: N/A;  11:15am  . CORONARY STENT PLACEMENT    . CYSTECTOMY  1969    pilonidal cyst  . LEFT AND RIGHT HEART CATHETERIZATION WITH CORONARY ANGIOGRAM N/A 09/15/2011   Procedure: LEFT AND RIGHT HEART CATHETERIZATION WITH CORONARY ANGIOGRAM;  Surgeon: Herby Abraham, MD;  Location[facility] CATH LAB;  Service: Cardiovascular;  Laterality: N/A;  . LEFT HEART CATH N/A 08/23/2011   Procedure: LEFT HEART CATH;  Surgeon: Iran Ouch, MD;  Location: [facility] CATH LAB;  Service: Cardiovascular;  Laterality: N/A;  . NO PAST SURGERIES    . PERCUTANEOUS CORONARY STENT INTERVENTION (PCI-S)  08/23/2011   Procedure: PERCUTANEOUS CORONARY STENT INTERVENTION (PCI-S);  Surgeon: Iran Ouch, MD;  Location: [facility] CATH LAB;  Service: Cardiovascular;;     Family History  Problem Relation Age of Onset  . Hypertension Father   . Stroke Mother   . 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   Social History   Tobacco Use  . Smoking status: Former Smoker    Packs/day: 1.00    Years: 25.00    Pack years: 25.00    Types: Cigarettes    Quit date: 08/23/1979    Years since quitting: 40.1  . Smokeless tobacco: Never Used  Substance Use Topics  . Alcohol use: Yes    Alcohol/week: 4.0 standard drinks  Types: 2 Cans of beer, 2 Shots of liquor per week    Comment: Weekends up to 3 times a week; 2-3 drinks per sitting.    Current Meds  Medication Sig  . allopurinol (ZYLOPRIM) 300 MG tablet TAKE 1 TABLET BY MOUTH ONCE DAILY FOR GOUT.  Marland Kitchen Alpha Lipoic Acid 200 MG CAPS Take 200 mg by mouth daily.   Marland Kitchen aspirin EC 81 MG tablet Take 81 mg by mouth daily.  . chlorthalidone (HYGROTON) 25 MG tablet Take 0.5 tablets (12.5 mg total) by mouth daily.  Marland Kitchen CINNAMON PO Take 1 capsule by mouth daily.  . Coenzyme Q10 (CO Q-10) 100 MG CAPS Take 100 mg by mouth daily.   Chilton Si Tea 150 MG CAPS Take 150 mg by mouth daily.   Marland Kitchen lisinopril (ZESTRIL) 20  MG tablet Take 1 tablet (20 mg total) by mouth daily.  . metoprolol succinate (TOPROL XL) 25 MG 24 hr tablet Take 0.5 tablets (12.5 mg total) by mouth daily.  . Multiple Vitamins-Minerals (CENTRUM SILVER 50+MEN PO) Take 1 tablet by mouth daily.  . nitroGLYCERIN (NITROSTAT) 0.4 MG SL tablet PLACE 1 TAB UNDER TONGUE EVERY 5 MIN IF NEEDED FOR CHEST PAIN. MAY USE 3 TIMES.NO RELIEF CALL 911.  Marland Kitchen RESVERATROL 100 MG CAPS Take 100 mg by mouth daily.   . rosuvastatin (CRESTOR) 40 MG tablet Take 1 tablet (40 mg total) by mouth daily.  . sildenafil (VIAGRA) 100 MG tablet TAKE (1) TABLET BY MOUTH AS NEEDED FOR ERECTILE DYSFUNCTION.  Marland Kitchen Testosterone 20 % CREA Apply 100 mg topically 2 (two) times daily.  . Turmeric 500 MG CAPS Take 500 mg by mouth daily.   . vitamin C (ASCORBIC ACID) 500 MG tablet Take 500 mg by mouth daily.      Depression screen [facility] 2/9 05/17/2019 09/24/2011  Decreased Interest 0 0  Down, Depressed, Hopeless 0 0  PHQ - 2 Score 0 0     Objective:   Today's Vitals: BP 138/76 (BP Location: Right Arm, Patient Position: Sitting, Cuff Size: Normal)   Pulse 90   Temp 97.6 F (36.4 C) (Temporal)   Resp 18   Ht 5\' 10"  (1.778 m)   Wt 213 lb 11.2 oz (96.9 kg) Comment: 207 at home  SpO2 98%   BMI 30.66 kg/m  Vitals with BMI 10/12/2019 09/07/2019 08/09/2019  Height 5\' 10"  5\' 10"  5\' 10"   Weight 213 lbs 11 oz 219 lbs 10 oz 209 lbs 6 oz  BMI 30.66 31.51 30.05  Systolic 138 114 08/11/2019  Diastolic 76 66 66  Pulse 90 76 63     Physical Exam   He looks systemically well.  He has lost 6 pounds since last time I saw him.  Blood pressure is acceptable.    Assessment   1. Erectile dysfunction due to arterial insufficiency   2. Dyslipidemia   3. Coronary artery disease involving native coronary artery of native heart without angina pectoris   4. Malaise and fatigue       Tests ordered Orders Placed This Encounter  Procedures  . Testosterone Total,Free,Bio, Males  . T3, free  . T4  .  TSH  . Ambulatory referral to Urology     Plan: 1. We will check testosterone levels but have told the patient that we can certainly increase the dose for optimal effects. 2. We will check thyroid function also and see if he has optimal T3 levels.  If he does not, I think this would be another  area to improve his overall condition. 3. As far as his erectile dysfunction is concerned, I think he clearly has a vascular issue but I am going to try to switch him to Cialis in addition to testosterone therapy and also will send him to urology for any further evaluation that might give [place] any other options. 4. I will see him in follow-up in a couple of months to see how he is doing.  Today I spent 40 minutes with this patient discussing his overall condition and answering all his questions.   Meds ordered this encounter  Medications  . tadalafil (CIALIS) 10 MG tablet    Sig: Take 1 tablet (10 mg total) by mouth every other day as needed for erectile dysfunction.    Dispense:  20 tablet    Refill:  3    Angila Wombles Normajean Glasgow, MD

## 2019-10-13 LAB — TESTOSTERONE TOTAL,FREE,BIO, MALES
Albumin: 4.5 g/dL (ref 3.6–5.1)
Sex Hormone Binding: 38 nmol/L (ref 22–77)
Testosterone, Bioavailable: 372.9 ng/dL — ABNORMAL HIGH (ref 15.0–150.0)
Testosterone, Free: 181.3 pg/mL — ABNORMAL HIGH (ref 6.0–73.0)
Testosterone: 1219 ng/dL — ABNORMAL HIGH (ref 250–827)

## 2019-10-13 LAB — T4: T4, Total: 7.1 ug/dL (ref 4.9–10.5)

## 2019-10-13 LAB — T3, FREE: T3, Free: 3.5 pg/mL (ref 2.3–4.2)

## 2019-10-13 LAB — TSH: TSH: 0.88 mIU/L (ref 0.40–4.50)

## 2019-10-23 NOTE — Progress Notes (Signed)
Please call this patient and let him know what I wrote on my chart, he appears not to read it.  He needs to increase testosterone cream so that he is taking 3 clicks twice a day.  Thanks.

## 2019-10-23 NOTE — Progress Notes (Signed)
Pt did see the results on my chart and he is doing the 3 clicks as directed. Pt stated he will make sure he notified you he read and confirmed to mychart.

## 2019-11-04 ENCOUNTER — Other Ambulatory Visit: Payer: Self-pay | Admitting: Cardiology

## 2019-11-06 ENCOUNTER — Other Ambulatory Visit (INDEPENDENT_AMBULATORY_CARE_PROVIDER_SITE_OTHER): Payer: Self-pay

## 2019-11-07 MED ORDER — TESTOSTERONE 20 % CREA
150.0000 mg | TOPICAL_CREAM | Freq: Two times a day (BID) | 0 refills | Status: DC
Start: 2019-11-07 — End: 2020-05-08

## 2019-11-13 ENCOUNTER — Other Ambulatory Visit: Payer: Self-pay | Admitting: Cardiology

## 2019-11-14 ENCOUNTER — Other Ambulatory Visit: Payer: Medicare PPO

## 2019-11-14 ENCOUNTER — Other Ambulatory Visit: Payer: Self-pay

## 2019-11-14 DIAGNOSIS — Z20822 Contact with and (suspected) exposure to covid-19: Secondary | ICD-10-CM | POA: Diagnosis not present

## 2019-11-15 LAB — NOVEL CORONAVIRUS, NAA: SARS-CoV-2, NAA: NOT DETECTED

## 2019-11-15 LAB — SARS-COV-2, NAA 2 DAY TAT

## 2019-11-21 ENCOUNTER — Other Ambulatory Visit (INDEPENDENT_AMBULATORY_CARE_PROVIDER_SITE_OTHER): Payer: Self-pay | Admitting: Internal Medicine

## 2019-11-21 ENCOUNTER — Other Ambulatory Visit: Payer: Self-pay | Admitting: Student

## 2019-11-21 ENCOUNTER — Telehealth (INDEPENDENT_AMBULATORY_CARE_PROVIDER_SITE_OTHER): Payer: Self-pay | Admitting: Internal Medicine

## 2019-11-21 MED ORDER — LISINOPRIL 20 MG PO TABS
20.0000 mg | ORAL_TABLET | Freq: Every day | ORAL | 1 refills | Status: DC
Start: 2019-11-21 — End: 2020-05-14

## 2019-11-21 MED ORDER — METOPROLOL SUCCINATE ER 25 MG PO TB24: 25.0000 mg | ORAL_TABLET | Freq: Every day | ORAL | 3 refills | Status: DC

## 2019-11-22 ENCOUNTER — Other Ambulatory Visit: Payer: Self-pay

## 2019-11-22 ENCOUNTER — Ambulatory Visit (INDEPENDENT_AMBULATORY_CARE_PROVIDER_SITE_OTHER): Payer: Medicare PPO | Admitting: Urology

## 2019-11-22 ENCOUNTER — Telehealth: Payer: Self-pay

## 2019-11-22 ENCOUNTER — Encounter: Payer: Self-pay | Admitting: Urology

## 2019-11-22 VITALS — BP 117/73 | HR 85 | Temp 99.7°F | Ht 70.0 in | Wt 213.7 lb

## 2019-11-22 DIAGNOSIS — N5201 Erectile dysfunction due to arterial insufficiency: Secondary | ICD-10-CM

## 2019-11-22 MED ORDER — VARDENAFIL HCL 20 MG PO TABS: 20.0000 mg | ORAL_TABLET | Freq: Every day | ORAL | 0 refills | Status: DC | PRN

## 2019-11-22 NOTE — Patient Instructions (Signed)
Erectile Dysfunction Erectile dysfunction (ED) is the inability to get or keep an erection in order to have sexual intercourse. Erectile dysfunction may include:  Inability to get an erection.  Lack of enough hardness of the erection to allow penetration.  Loss of the erection before sex is finished. What are the causes? This condition may be caused by:  Certain medicines, such as: ? Pain relievers. ? Antihistamines. ? Antidepressants. ? Blood pressure medicines. ? Water pills (diuretics). ? Ulcer medicines. ? Muscle relaxants. ? Drugs.  Excessive drinking.  Psychological causes, such as: ? Anxiety. ? Depression. ? Sadness. ? Exhaustion. ? Performance fear. ? Stress.  Physical causes, such as: ? Artery problems. This may include diabetes, smoking, liver disease, or atherosclerosis. ? High blood pressure. ? Hormonal problems, such as low testosterone. ? Obesity. ? Nerve problems. This may include back or pelvic injuries, diabetes mellitus, multiple sclerosis, or Parkinson disease. What are the signs or symptoms? Symptoms of this condition include:  Inability to get an erection.  Lack of enough hardness of the erection to allow penetration.  Loss of the erection before sex is finished.  Normal erections at some times, but with frequent unsatisfactory episodes.  Low sexual satisfaction in either partner due to erection problems.  A curved penis occurring with erection. The curve may cause pain or the penis may be too curved to allow for intercourse.  Never having nighttime erections. How is this diagnosed? This condition is often diagnosed by:  Performing a physical exam to find other diseases or specific problems with the penis.  Asking you detailed questions about the problem.  Performing blood tests to check for diabetes mellitus or to measure hormone levels.  Performing other tests to check for underlying health conditions.  Performing an ultrasound  exam to check for scarring.  Performing a test to check blood flow to the penis.  Doing a sleep study at home to measure nighttime erections. How is this treated? This condition may be treated by:  Medicine taken by mouth to help you achieve an erection (oral medicine).  Hormone replacement therapy to replace low testosterone levels.  Medicine that is injected into the penis. Your health care provider may instruct you how to give yourself these injections at home.  Vacuum pump. This is a pump with a ring on it. The pump and ring are placed on the penis and used to create pressure that helps the penis become erect.  Penile implant surgery. In this procedure, you may receive: ? An inflatable implant. This consists of cylinders, a pump, and a reservoir. The cylinders can be inflated with a fluid that helps to create an erection, and they can be deflated after intercourse. ? A semi-rigid implant. This consists of two silicone rubber rods. The rods provide some rigidity. They are also flexible, so the penis can both curve downward in its normal position and become straight for sexual intercourse.  Blood vessel surgery, to improve blood flow to the penis. During this procedure, a blood vessel from a different part of the body is placed into the penis to allow blood to flow around (bypass) damaged or blocked blood vessels.  Lifestyle changes, such as exercising more, losing weight, and quitting smoking. Follow these instructions at home: Medicines   Take over-the-counter and prescription medicines only as told by your health care provider. Do not increase the dosage without first discussing it with your health care provider.  If you are using self-injections, perform injections as directed by your   health care provider. Make sure to avoid any veins that are on the surface of the penis. After giving an injection, apply pressure to the injection site for 5 minutes. General  instructions  Exercise regularly, as directed by your health care provider. Work with your health care provider to lose weight, if needed.  Do not use any products that contain nicotine or tobacco, such as cigarettes and e-cigarettes. If you need help quitting, ask your health care provider.  Before using a vacuum pump, read the instructions that come with the pump and discuss any questions with your health care provider.  Keep all follow-up visits as told by your health care provider. This is important. Contact a health care provider if:  You feel nauseous.  You vomit. Get help right away if:  You are taking oral or injectable medicines and you have an erection that lasts longer than 4 hours. If your health care provider is unavailable, go to the nearest emergency room for evaluation. An erection that lasts much longer than 4 hours can result in permanent damage to your penis.  You have severe pain in your groin or abdomen.  You develop redness or severe swelling of your penis.  You have redness spreading up into your groin or lower abdomen.  You are unable to urinate.  You experience chest pain or a rapid heart beat (palpitations) after taking oral medicines. Summary  Erectile dysfunction (ED) is the inability to get or keep an erection during sexual intercourse. This problem can usually be treated successfully.  This condition is diagnosed based on a physical exam, your symptoms, and tests to determine the cause. Treatment varies depending on the cause, and may include medicines, hormone therapy, surgery, or vacuum pump.  You may need follow-up visits to make sure that you are using your medicines or devices correctly.  Get help right away if you are taking or injecting medicines and you have an erection that lasts longer than 4 hours. This information is not intended to replace advice given to you by your health care provider. Make sure you discuss any questions you have with  your health care provider. Document Revised: 12/18/2016 Document Reviewed: 01/22/2016 Elsevier Patient Education  2020 Elsevier Inc.  

## 2019-11-22 NOTE — Telephone Encounter (Signed)
Done

## 2019-11-22 NOTE — Progress Notes (Signed)
Urological Symptom Review  Patient is experiencing the following symptoms: Get up at night to urinate Erection pain  Review of Systems  Gastrointestinal (upper)  : Negative for upper GI symptoms  Gastrointestinal (lower) : Negative for lower GI symptoms  Constitutional : Negative for symptoms  Skin: Negative for skin symptoms  Eyes: Negative for eye symptoms  Ear/Nose/Throat : Negative for Ear/Nose/Throat symptoms  Hematologic/Lymphatic: Negative for Hematologic/Lymphatic symptoms  Cardiovascular : Negative for cardiovascular symptoms  Respiratory : Negative for respiratory symptoms  Endocrine: Negative for endocrine symptoms  Musculoskeletal: Negative for musculoskeletal symptoms  Neurological: Negative for neurological symptoms  Psychologic: Negative for psychiatric symptoms

## 2019-11-22 NOTE — Progress Notes (Signed)
11/22/2019 2:29 PM   Austin Bright 1945-10-04 [id number]  Referring provider: Wilson Singer, MD [address] [place],  [place] [address]  Erectile dysfunction  HPI: Mr Austin Bright is a 74yo here for evaluation of erectile dysfunction. He had an MI in 2013 and since then he had noted an inability to get a firm erection. He has tried Viagra 50mg  which improved the firmness but it was not firm enough for penetration. He tried cialis which did not improve his ED. He had shock wave therapy at [facility] which did not improve his ED. He was given 47ml of trimix which caused a painful erection.   PMH: Past Medical History:  Diagnosis Date  . Acute MI (HCC) 08/2011  . Arthritis   . Bilateral pneumonia    Diagnosed after STEMI 08/2011  . CAD (coronary artery disease)    a. Diagnosed 08/2011 with anterior STEMI due to thrombotic occlusion of mid LAD s/p thrombectomy, PTCA, DES placement 08/23/11.  10/23/11 CHF (congestive heart failure) (HCC)   . Dyslipidemia   . Gout   . Hypertension   . Ischemic cardiomyopathy    a. Initial EF 35% by cath 08/23/11, improved to 40-45% by echo 08/25/11.  10/25/11 Shortness of breath     Surgical History: Past Surgical History:  Procedure Laterality Date  . carpel tunnel Bilateral 2002  . COLONOSCOPY WITH PROPOFOL N/A 04/08/2017   Procedure: COLONOSCOPY WITH PROPOFOL;  Surgeon: 04/10/2017, MD;  [facility]: [facility];  Service: Endoscopy;  Laterality: N/A;  11:15am  . CORONARY STENT PLACEMENT    . CYSTECTOMY  1969   pilonidal cyst  . LEFT AND RIGHT HEART CATHETERIZATION WITH CORONARY ANGIOGRAM N/A 09/15/2011   Procedure: LEFT AND RIGHT HEART CATHETERIZATION WITH CORONARY ANGIOGRAM;  Surgeon: 09/17/2011, MD;  Location: [facility] CATH LAB;  Service: Cardiovascular;  Laterality: N/A;  . LEFT HEART CATH N/A 08/23/2011   Procedure: LEFT HEART CATH;  Surgeon: 10/23/2011, MD;  Location: [facility] CATH LAB;  Service: Cardiovascular;  Laterality: N/A;  . NO PAST  SURGERIES    . PERCUTANEOUS CORONARY STENT INTERVENTION (PCI-S)  08/23/2011   Procedure: PERCUTANEOUS CORONARY STENT INTERVENTION (PCI-S);  Surgeon: 10/23/2011, MD;  Location: [facility] CATH LAB;  Service: Cardiovascular;;    Home Medications:  Allergies as of 11/22/2019   No Known Allergies     Medication List       Accurate as of November 22, 2019  2:29 PM. If you have any questions, ask your nurse or doctor.        allopurinol 300 MG tablet Commonly known as: ZYLOPRIM TAKE 1 TABLET BY MOUTH ONCE DAILY FOR GOUT.   Alpha Lipoic Acid 200 MG Caps Take 200 mg by mouth daily.   aspirin EC 81 MG tablet Take 81 mg by mouth daily.   CENTRUM SILVER 50+MEN PO Take 1 tablet by mouth daily.   chlorthalidone 25 MG tablet Commonly known as: HYGROTON TAKE 1/2 TABLET BY MOUTH ONCE DAILY.   CINNAMON PO Take 1 capsule by mouth daily.   Co Q-10 100 MG Caps Take 100 mg by mouth daily.   Green Tea 150 MG Caps Take 150 mg by mouth daily.   lisinopril 20 MG tablet Commonly known as: ZESTRIL Take 1 tablet (20 mg total) by mouth daily.   metoprolol succinate 25 MG 24 hr tablet Commonly known as: Toprol XL Take 1 tablet (25 mg total) by mouth daily.   nitroGLYCERIN  0.4 MG SL tablet Commonly known as: NITROSTAT PLACE 1 TAB UNDER TONGUE EVERY 5 MIN IF NEEDED FOR CHEST PAIN. MAY USE 3 TIMES.NO RELIEF CALL 911.   Resveratrol 100 MG Caps Take 100 mg by mouth daily.   rosuvastatin 40 MG tablet Commonly known as: CRESTOR Take 1 tablet (40 mg total) by mouth daily.   sildenafil 100 MG tablet Commonly known as: VIAGRA TAKE (1) TABLET BY MOUTH AS NEEDED FOR ERECTILE DYSFUNCTION.   tadalafil 10 MG tablet Commonly known as: CIALIS Take 1 tablet (10 mg total) by mouth every other day as needed for erectile dysfunction.   Testosterone 20 % Crea Apply 150 mg topically 2 (two) times daily.   Turmeric 500 MG Caps Take 500 mg by mouth daily.   vitamin C 500 MG tablet Commonly known as:  ASCORBIC ACID Take 500 mg by mouth daily.       Allergies: No Known Allergies  Family History: Family History  Problem Relation Age of Onset  . Hypertension Father   . Stroke Mother   . Colon cancer Neg Hx     Social History:  reports that he quit smoking about 40 years ago. His smoking use included cigarettes. He has a 25.00 pack-year smoking history. He has never used smokeless tobacco. He reports current alcohol use of about 4.0 standard drinks of alcohol per week. He reports that he does not use drugs.  ROS: All other review of systems were reviewed and are negative except what is noted above in HPI  Physical Exam: BP 117/73   Pulse 85   Temp 99.7 F (37.6 C)   Ht 5\' 10"  (1.778 m)   Wt 213 lb 11.2 oz (96.9 kg)   BMI 30.66 kg/m   Constitutional:  Alert and oriented, No acute distress. HEENT: [place] AT, moist mucus membranes.  Trachea midline, no masses. Cardiovascular: No clubbing, cyanosis, or edema. Respiratory: Normal respiratory effort, no increased work of breathing. GI: Abdomen is soft, nontender, nondistended, no abdominal masses GU: No CVA tenderness.  Lymph: No cervical or inguinal lymphadenopathy. Skin: No rashes, bruises or suspicious lesions. Neurologic: Grossly intact, no focal deficits, moving all 4 extremities. Psychiatric: Normal mood and affect.  Laboratory Data: Lab Results  Component Value Date   WBC 7.2 10/21/2018   HGB 16.7 10/21/2018   HCT 49.4 10/21/2018   MCV 95.2 10/21/2018   PLT 164 10/21/2018    Lab Results  Component Value Date   CREATININE 0.98 05/17/2019    Lab Results  Component Value Date   PSA 2.1 05/31/2019    Lab Results  Component Value Date   TESTOSTERONE 1,219 (H) 10/12/2019    Lab Results  Component Value Date   HGBA1C 5.2 05/17/2019    Urinalysis    Component Value Date/Time   COLORURINE YELLOW 10/21/2018 0840   APPEARANCEUR CLEAR 10/21/2018 0840   LABSPEC 1.015 10/21/2018 0840   PHURINE 6.0 10/21/2018  0840   GLUCOSEU NEGATIVE 10/21/2018 0840   HGBUR NEGATIVE 10/21/2018 0840   BILIRUBINUR NEGATIVE 10/21/2018 0840   KETONESUR NEGATIVE 10/21/2018 0840   PROTEINUR NEGATIVE 10/21/2018 0840   NITRITE NEGATIVE 10/21/2018 0840   LEUKOCYTESUR NEGATIVE 10/21/2018 0840    No results found for: LABMICR, WBCUA, RBCUA, LABEPIT, MUCUS, BACTERIA  Pertinent Imaging:  No results found for this or any previous visit.  No results found for this or any previous visit.  No results found for this or any previous visit.  No results found for this or any previous  visit.  Results for orders placed during the hospital encounter of 04/22/16  US Renal  Narrative CLINICAL DATA:  Hypertension.  EXAM: RENAL / URINARY TRACT ULTRASOUND COMPLETE  COMPARISON:  01/05/2011  FINDINGS: Right Kidney:  Length: 11.7 cm. Echogenicity within normal limits. No mass or hydronephrosis visualized.  Left Kidney:  Length: 12.1 cm. Echogenicity within normal limits. No mass or hydronephrosis visualized.  Bladder:  Appears normal for degree of bladder distention. Bilateral ureteral jets.  IMPRESSION: Normal urinary tract ultrasound.   Electronically Signed By: Paulina Fusi M.D. On: 04/22/2016 13:39  No results found for this or any previous visit.  No results found for this or any previous visit.  No results found for this or any previous visit.   Assessment & Plan:    1. Erectile dysfunction due to arterial insufficiency -We will trial levitra. If this fails to improve his ED we will start trimix.    No follow-ups on file.  Wilkie Aye, MD  [facility] [place]

## 2019-11-22 NOTE — Telephone Encounter (Signed)
Made pt aware new rx sent into [facility] per Dr. Ronne Binning.

## 2019-11-29 ENCOUNTER — Other Ambulatory Visit: Payer: Self-pay | Admitting: Cardiology

## 2019-12-11 ENCOUNTER — Ambulatory Visit (INDEPENDENT_AMBULATORY_CARE_PROVIDER_SITE_OTHER): Payer: Medicare PPO | Admitting: Internal Medicine

## 2019-12-11 ENCOUNTER — Other Ambulatory Visit: Payer: Self-pay

## 2019-12-11 ENCOUNTER — Encounter (INDEPENDENT_AMBULATORY_CARE_PROVIDER_SITE_OTHER): Payer: Self-pay | Admitting: Internal Medicine

## 2019-12-11 VITALS — BP 128/82 | HR 76 | Temp 98.2°F | Ht 70.0 in | Wt 208.2 lb

## 2019-12-11 DIAGNOSIS — Z23 Encounter for immunization: Secondary | ICD-10-CM | POA: Diagnosis not present

## 2019-12-11 DIAGNOSIS — E785 Hyperlipidemia, unspecified: Secondary | ICD-10-CM

## 2019-12-11 DIAGNOSIS — I2510 Atherosclerotic heart disease of native coronary artery without angina pectoris: Secondary | ICD-10-CM

## 2019-12-11 DIAGNOSIS — N5201 Erectile dysfunction due to arterial insufficiency: Secondary | ICD-10-CM | POA: Diagnosis not present

## 2019-12-11 DIAGNOSIS — I5042 Chronic combined systolic (congestive) and diastolic (congestive) heart failure: Secondary | ICD-10-CM | POA: Diagnosis not present

## 2019-12-11 NOTE — Addendum Note (Signed)
Addended by: Faustino Congress L on: 12/11/2019 11:01 AM   Modules accepted: Orders

## 2019-12-11 NOTE — Progress Notes (Signed)
Metrics: Intervention Frequency ACO  Documented Smoking Status Yearly  Screened one or more times in 24 months  Cessation Counseling or  Active cessation medication Past 24 months  Past 24 months   Guideline developer: UpToDate (See UpToDate for funding source) Date Released: 2014       Wellness Office Visit  Subjective:  Patient ID: Austin Bright, male    DOB: 10-05-1945  Age: 74 y.o. MRN: [id number]  CC: This man comes in for follow-up of hyperlipidemia, erectile dysfunction, coronary artery disease. HPI  He is doing reasonably well.  As far as testosterone therapy is concerned, he does apply testosterone cream at least once a day but sometimes is not able to do it twice a day.  However, the higher dose of 3 clicks daily seems to have given him more energy and erectile function to some degree has improved.  Viagra is the medication which seems to help him the best with erectile dysfunction. He denies any other specific complaints such as chest pain. Past Medical History:  Diagnosis Date  . Acute MI (HCC) 08/2011  . Arthritis   . Bilateral pneumonia    Diagnosed after STEMI 08/2011  . CAD (coronary artery disease)    a. Diagnosed 08/2011 with anterior STEMI due to thrombotic occlusion of mid LAD s/p thrombectomy, PTCA, DES placement 08/23/11.  Marland Kitchen CHF (congestive heart failure) (HCC)   . Dyslipidemia   . Gout   . Hypertension   . Ischemic cardiomyopathy    a. Initial EF 35% by cath 08/23/11, improved to 40-45% by echo 08/25/11.  Marland Kitchen Shortness of breath    Past Surgical History:  Procedure Laterality Date  . carpel tunnel Bilateral 2002  . COLONOSCOPY WITH PROPOFOL N/A 04/08/2017   Procedure: COLONOSCOPY WITH PROPOFOL;  Surgeon: Corbin Ade, MD;  [facility]: [facility];  Service: Endoscopy;  Laterality: N/A;  11:15am  . CORONARY STENT PLACEMENT    . CYSTECTOMY  1969   pilonidal cyst  . LEFT AND RIGHT HEART CATHETERIZATION WITH CORONARY ANGIOGRAM N/A 09/15/2011   Procedure:  LEFT AND RIGHT HEART CATHETERIZATION WITH CORONARY ANGIOGRAM;  Surgeon: Herby Abraham, MD;  Location: [facility] CATH LAB;  Service: Cardiovascular;  Laterality: N/A;  . LEFT HEART CATH N/A 08/23/2011   Procedure: LEFT HEART CATH;  Surgeon: Iran Ouch, MD;  Location: [facility] CATH LAB;  Service: Cardiovascular;  Laterality: N/A;  . NO PAST SURGERIES    . PERCUTANEOUS CORONARY STENT INTERVENTION (PCI-S)  08/23/2011   Procedure: PERCUTANEOUS CORONARY STENT INTERVENTION (PCI-S);  Surgeon: Iran Ouch, MD;  Location: [facility] CATH LAB;  Service: Cardiovascular;;     Family History  Problem Relation Age of Onset  . Hypertension Father   . Stroke Mother   . 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   Social History   Tobacco Use  . Smoking status: Former Smoker    Packs/day: 1.00    Years: 25.00    Pack years: 25.00    Types: Cigarettes    Quit date: 08/23/1979    Years since quitting: 40.3  . Smokeless tobacco: Never Used  Substance Use Topics  . Alcohol use: Yes    Alcohol/week: 4.0 standard drinks    Types: 2 Cans of beer, 2 Shots of liquor per week    Comment: Weekends up to 3 times a week;  2-3 drinks per sitting.    Current Meds  Medication Sig  . allopurinol (ZYLOPRIM) 300 MG tablet TAKE 1 TABLET BY MOUTH ONCE DAILY FOR GOUT.  Marland Kitchen Alpha Lipoic Acid 200 MG CAPS Take 200 mg by mouth daily.   Marland Kitchen aspirin EC 81 MG tablet Take 81 mg by mouth daily.  . chlorthalidone (HYGROTON) 25 MG tablet TAKE 1/2 TABLET BY MOUTH ONCE DAILY.  Marland Kitchen CINNAMON PO Take 1 capsule by mouth daily.  . Coenzyme Q10 (CO Q-10) 100 MG CAPS Take 100 mg by mouth daily.   Chilton Si Tea 150 MG CAPS Take 150 mg by mouth daily.   Marland Kitchen lisinopril (ZESTRIL) 20 MG tablet Take 1 tablet (20 mg total) by mouth daily.  . metoprolol succinate (TOPROL XL) 25 MG 24 hr tablet Take 1  tablet (25 mg total) by mouth daily.  . Multiple Vitamins-Minerals (CENTRUM SILVER 50+MEN PO) Take 1 tablet by mouth daily.  . nitroGLYCERIN (NITROSTAT) 0.4 MG SL tablet PLACE 1 TAB UNDER TONGUE EVERY 5 MIN IF NEEDED FOR CHEST PAIN. MAY USE 3 TIMES.NO RELIEF CALL 911.  Marland Kitchen RESVERATROL 100 MG CAPS Take 100 mg by mouth daily.   . rosuvastatin (CRESTOR) 40 MG tablet Take 1 tablet (40 mg total) by mouth daily.  . sildenafil (VIAGRA) 100 MG tablet TAKE (1) TABLET BY MOUTH AS NEEDED FOR ERECTILE DYSFUNCTION.  . tadalafil (CIALIS) 10 MG tablet Take 1 tablet (10 mg total) by mouth every other day as needed for erectile dysfunction.  . Testosterone 20 % CREA Apply 150 mg topically 2 (two) times daily.  . Turmeric 500 MG CAPS Take 500 mg by mouth daily.   . vardenafil (LEVITRA) 20 MG tablet Take 1 tablet (20 mg total) by mouth daily as needed for erectile dysfunction.  . vitamin C (ASCORBIC ACID) 500 MG tablet Take 500 mg by mouth daily.      Depression screen [facility] 2/9 12/11/2019 05/17/2019 09/24/2011  Decreased Interest 0 0 0  Down, Depressed, Hopeless 0 0 0  PHQ - 2 Score 0 0 0  Altered sleeping 0 - -  Tired, decreased energy 0 - -  Change in appetite 0 - -  Feeling bad or failure about yourself  0 - -  Trouble concentrating 0 - -  Moving slowly or fidgety/restless 0 - -  Suicidal thoughts 0 - -  PHQ-9 Score 0 - -  Difficult doing work/chores Not difficult at all - -     Objective:   Today's Vitals: BP 128/82   Pulse 76   Temp 98.2 F (36.8 C) (Temporal)   Ht 5\' 10"  (1.778 m)   Wt 208 lb 3.2 oz (94.4 kg)   SpO2 95%   BMI 29.87 kg/m  Vitals with BMI 12/11/2019 11/22/2019 10/12/2019  Height 5\' 10"  5\' 10"  5\' 10"   Weight 208 lbs 3 oz 213 lbs 11 oz 213 lbs 11 oz  BMI 29.87 30.66 30.66  Systolic 128 117 10/14/2019  Diastolic 82 73 76  Pulse 76 85 90     Physical Exam  He looks systemically well.  He has lost 5 pounds since last visit.  Blood pressure good for his age.  He is alert and  orientated without any focal neurological signs.     Assessment   1. Erectile dysfunction due to arterial insufficiency   2. Dyslipidemia   3. Coronary artery disease involving native coronary artery of native heart without angina pectoris   4. Chronic combined systolic and diastolic congestive  heart failure (HCC)       Tests ordered No orders of the defined types were placed in this encounter.    Plan: 1. He will continue with testosterone therapy as before which seems to have helped his erectile dysfunction in addition to use of Viagra. 2. He will continue with statin therapy for dyslipidemia in the face of coronary artery disease.  We did discuss the drawbacks of statin therapy in terms of increasing insulin resistance. 3. Follow-up with me in about 6 months time.  High-dose influenza vaccination was given today.   No orders of the defined types were placed in this encounter.   Wilson Singer, MD

## 2019-12-22 ENCOUNTER — Ambulatory Visit: Payer: Medicare PPO | Admitting: Urology

## 2019-12-31 ENCOUNTER — Encounter (INDEPENDENT_AMBULATORY_CARE_PROVIDER_SITE_OTHER): Payer: Self-pay | Admitting: Internal Medicine

## 2019-12-31 ENCOUNTER — Telehealth: Payer: Medicare PPO | Admitting: Nurse Practitioner

## 2019-12-31 DIAGNOSIS — Z20822 Contact with and (suspected) exposure to covid-19: Secondary | ICD-10-CM | POA: Diagnosis not present

## 2019-12-31 DIAGNOSIS — R0989 Other specified symptoms and signs involving the circulatory and respiratory systems: Secondary | ICD-10-CM

## 2019-12-31 MED ORDER — FLUTICASONE PROPIONATE 50 MCG/ACT NA SUSP: 2.0000 | Freq: Every day | NASAL | 0 refills | Status: DC

## 2019-12-31 MED ORDER — PROMETHAZINE-DM 6.25-15 MG/5ML PO SYRP: 5.0000 mL | ORAL_SOLUTION | Freq: Four times a day (QID) | ORAL | 0 refills | Status: AC | PRN

## 2019-12-31 MED ORDER — NAPROXEN 500 MG PO TABS: 500.0000 mg | ORAL_TABLET | Freq: Two times a day (BID) | ORAL | 0 refills | Status: DC

## 2019-12-31 NOTE — Progress Notes (Signed)
E-Visit for Corona Virus Screening   Your current symptoms could be consistent with the coronavirus.  Many health care providers can now test patients at their office but not all are.  [facility] has multiple testing sites. For information on our COVID testing locations and hours go to [email or web address]  We are enrolling you in our MyChart Home Monitoring for COVID19 . Daily you will receive a questionnaire within the MyChart website. Our COVID 19 response team will be monitoring your responses daily.  Testing Information: The COVID-19 Community Testing sites are testing BY APPOINTMENT ONLY.  You can schedule online at [email or web address]  If you do not have access to a smart phone or computer you may call [phone] for an appointment.   Additional testing sites in the Community:  . For [facility] Testing sites in [facility]  [email or web address]  . For Pop-up testing sites in [place]  [email or web address]  . For [facility] [email or web address]  . For [facility] testing in [place] and [employer] [email or web address]  . For [facility] testing in [facility]   [email or web address]  For  more information about community testing call [phone]   Please quarantine yourself while awaiting your test results. Please stay home for a minimum of 10 days from the first day of illness with improving symptoms and you have had 24 hours of no fever (without the use of Tylenol (Acetaminophen) Motrin (Ibuprofen) or any fever reducing medication).  Also - Do not get tested prior to returning to work because once you have had a positive test the test can stay  positive for more than a month in some cases.   You should wear a mask or cloth face covering over your nose and mouth if you must be around other people or animals, including pets (even at home). Try to stay at least 6 feet away from other people. This will protect the people around you.  Please continue good preventive care measures, including:  frequent hand-washing, avoid touching your face, cover coughs/sneezes, stay out of crowds and keep a 6 foot distance from others.  COVID-19 is a respiratory illness with symptoms that are similar to the flu. Symptoms are typically mild to moderate, but there have been cases of severe illness and death due to the virus.   The following symptoms may appear 2-14 days after exposure: . Fever . Cough . Shortness of breath or difficulty breathing . Chills . Repeated shaking with chills . Muscle pain . Headache . Sore throat . New loss of taste or smell . Fatigue . Congestion or runny nose . Nausea or vomiting . Diarrhea  Go to the nearest hospital ED for assessment if fever/cough/breathlessness are severe or illness seems like a threat to life.  It is vitally important that if you feel that you have an infection such as this virus or any other virus that you stay home and away from places where you may spread it to others.  You should avoid contact with people age 52 and older.   You can use medication such as A prescription cough medication called Phenergan DM 6.25 mg/15 mg. You make take one teaspoon / 5 ml every 4-6 hours as needed for cough, A prescription anti-inflammatory called Naprosyn 500 mg. Take twice daily as needed for fever or body aches for 2 weeks and A prescription for Fluticasone nasal spray 2 sprays in each nostril one time per day  You may also take acetaminophen (Tylenol) as needed for fever.  Reduce  your risk of any infection by using the same precautions used for avoiding the common cold or flu:  Marland Kitchen Wash your hands often with soap  and warm water for at least 20 seconds.  If soap and water are not readily available, use an alcohol-based hand sanitizer with at least 60% alcohol.  . If coughing or sneezing, cover your mouth and nose by coughing or sneezing into the elbow areas of your shirt or coat, into a tissue or into your sleeve (not your hands). . Avoid shaking hands with others and consider head nods or verbal greetings only. . Avoid touching your eyes, nose, or mouth with unwashed hands.  . Avoid close contact with people who are sick. . Avoid places or events with large numbers of people in one location, like concerts or sporting events. . Carefully consider travel plans you have or are making. . If you are planning any travel outside or inside the [place], visit the CDC's Travelers' Health webpage for the latest health notices. . If you have some symptoms but not all symptoms, continue to monitor at home and seek medical attention if your symptoms worsen. . If you are having a medical emergency, call 911.  HOME CARE . Only take medications as instructed by your medical team. . Drink plenty of fluids and get plenty of rest. . A steam or ultrasonic humidifier can help if you have congestion.   GET HELP RIGHT AWAY IF YOU HAVE EMERGENCY WARNING SIGNS** FOR COVID-19. If you or someone is showing any of these signs seek emergency medical care immediately. Call 911 or proceed to your closest emergency facility if: . You develop worsening high fever. . Trouble breathing . Bluish lips or face . Persistent pain or pressure in the chest . New confusion . Inability to wake or stay awake . You cough up blood. . Your symptoms become more severe  **This list is not all possible symptoms. Contact your medical provider for any symptoms that are sever or concerning to you.  MAKE SURE YOU   Understand these instructions.  Will watch your condition.  Will get help right away if you are not doing well or get worse.  Your  e-visit answers were reviewed by a board certified advanced clinical practitioner to complete your personal care plan.  Depending on the condition, your plan could have included both over the counter or prescription medications.  If there is a problem please reply once you have received a response from your provider.  Your safety is important to [place].  If you have drug allergies check your prescription carefully.    You can use MyChart to ask questions about today's visit, request a non-urgent call back, or ask for a work or school excuse for 24 hours related to this e-Visit. If it has been greater than 24 hours you will need to follow up with your provider, or enter a new e-Visit to address those concerns. You will get an e-mail in the next two days asking about your experience.  I hope that your e-visit has been valuable and will speed your recovery. Thank you for using e-visits.  I have spent at least 5 minutes reviewing and documenting in the patient's chart.

## 2020-01-01 ENCOUNTER — Telehealth (INDEPENDENT_AMBULATORY_CARE_PROVIDER_SITE_OTHER): Payer: Medicare PPO | Admitting: Internal Medicine

## 2020-01-01 ENCOUNTER — Encounter (INDEPENDENT_AMBULATORY_CARE_PROVIDER_SITE_OTHER): Payer: Self-pay | Admitting: Internal Medicine

## 2020-01-01 DIAGNOSIS — R059 Cough, unspecified: Secondary | ICD-10-CM

## 2020-01-01 MED ORDER — AZITHROMYCIN 250 MG PO TABS: ORAL_TABLET | ORAL | 0 refills | Status: DC

## 2020-01-01 NOTE — Progress Notes (Signed)
Metrics: Intervention Frequency ACO  Documented Smoking Status Yearly  Screened one or more times in 24 months  Cessation Counseling or  Active cessation medication Past 24 months  Past 24 months   Guideline developer: UpToDate (See UpToDate for funding source) Date Released: 2014       Wellness Office Visit  Subjective:  Patient ID: DAWSEN KRIEGER II, male    DOB: 10-10-1945  Age: 74 y.o. MRN: [id number]  CC: This is an audio telemedicine visit with the permission of patient who is at home and I am in my office. I used 2 identifiers to identify the patient. Productive cough HPI  This patient appears to have upper respiratory tract symptoms which started approximately 3 days ago.  He already has had a COVID-19 test done yesterday and is awaiting the result as it is a PCR test.  He denies any fever, myalgias.  He is fully vaccinated with booster COVID-19 vaccine. He is producing a cough of yellow sputum.  He denies any dyspnea.  He has a scratchy throat. Past Medical History:  Diagnosis Date  . Acute MI (HCC) 08/2011  . Arthritis   . Bilateral pneumonia    Diagnosed after STEMI 08/2011  . CAD (coronary artery disease)    a. Diagnosed 08/2011 with anterior STEMI due to thrombotic occlusion of mid LAD s/p thrombectomy, PTCA, DES placement 08/23/11.  Marland Kitchen CHF (congestive heart failure) (HCC)   . Dyslipidemia   . Gout   . Hypertension   . Ischemic cardiomyopathy    a. Initial EF 35% by cath 08/23/11, improved to 40-45% by echo 08/25/11.  Marland Kitchen Shortness of breath    Past Surgical History:  Procedure Laterality Date  . carpel tunnel Bilateral 2002  . COLONOSCOPY WITH PROPOFOL N/A 04/08/2017   Procedure: COLONOSCOPY WITH PROPOFOL;  Surgeon: Corbin Ade, MD;  [facility];  Service: Endoscopy;  Laterality: N/A;  11:15am  . CORONARY STENT PLACEMENT    . CYSTECTOMY  1969   pilonidal cyst  . LEFT AND RIGHT HEART CATHETERIZATION WITH CORONARY ANGIOGRAM N/A 09/15/2011   Procedure:  LEFT AND RIGHT HEART CATHETERIZATION WITH CORONARY ANGIOGRAM;  Surgeon: Herby Abraham, MD;  Location[facility] CATH LAB;  Service: Cardiovascular;  Laterality: N/A;  . LEFT HEART CATH N/A 08/23/2011   Procedure: LEFT HEART CATH;  Surgeon: Iran Ouch, MD;  Location: [facility] CATH LAB;  Service: Cardiovascular;  Laterality: N/A;  . NO PAST SURGERIES    . PERCUTANEOUS CORONARY STENT INTERVENTION (PCI-S)  08/23/2011   Procedure: PERCUTANEOUS CORONARY STENT INTERVENTION (PCI-S);  Surgeon: Iran Ouch, MD;  Location: [facility] CATH LAB;  Service: Cardiovascular;;     Family History  Problem Relation Age of Onset  . Hypertension Father   . Stroke Mother   . 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   Social History   Tobacco Use  . Smoking status: Former Smoker    Packs/day: 1.00    Years: 25.00    Pack years: 25.00    Types: Cigarettes    Quit date: 08/23/1979    Years since quitting: 40.3  . Smokeless tobacco: Never Used  Substance Use Topics  . Alcohol use: Yes    Alcohol/week: 4.0 standard drinks    Types: 2 Cans of beer, 2 Shots of liquor per week    Comment: Weekends up  to 3 times a week; 2-3 drinks per sitting.    Current Meds  Medication Sig  . allopurinol (ZYLOPRIM) 300 MG tablet TAKE 1 TABLET BY MOUTH ONCE DAILY FOR GOUT.  Marland Kitchen Alpha Lipoic Acid 200 MG CAPS Take 200 mg by mouth daily.   Marland Kitchen aspirin EC 81 MG tablet Take 81 mg by mouth daily.  . chlorthalidone (HYGROTON) 25 MG tablet TAKE 1/2 TABLET BY MOUTH ONCE DAILY.  Marland Kitchen CINNAMON PO Take 1 capsule by mouth daily.  . Coenzyme Q10 (CO Q-10) 100 MG CAPS Take 100 mg by mouth daily.   . fluticasone (FLONASE) 50 MCG/ACT nasal spray Place 2 sprays into both nostrils daily for 14 days.  Chilton Si Tea 150 MG CAPS Take 150 mg by mouth daily.   Marland Kitchen lisinopril (ZESTRIL) 20 MG tablet Take 1  tablet (20 mg total) by mouth daily.  . metoprolol succinate (TOPROL XL) 25 MG 24 hr tablet Take 1 tablet (25 mg total) by mouth daily.  . Multiple Vitamins-Minerals (CENTRUM SILVER 50+MEN PO) Take 1 tablet by mouth daily.  . naproxen (NAPROSYN) 500 MG tablet Take 1 tablet (500 mg total) by mouth 2 (two) times daily with a meal.  . nitroGLYCERIN (NITROSTAT) 0.4 MG SL tablet PLACE 1 TAB UNDER TONGUE EVERY 5 MIN IF NEEDED FOR CHEST PAIN. MAY USE 3 TIMES.NO RELIEF CALL 911.  . promethazine-dextromethorphan (PROMETHAZINE-DM) 6.25-15 MG/5ML syrup Take 5 mLs by mouth 4 (four) times daily as needed for up to 7 days for cough.  . RESVERATROL 100 MG CAPS Take 100 mg by mouth daily.   . rosuvastatin (CRESTOR) 40 MG tablet Take 1 tablet (40 mg total) by mouth daily.  . sildenafil (VIAGRA) 100 MG tablet TAKE (1) TABLET BY MOUTH AS NEEDED FOR ERECTILE DYSFUNCTION.  Marland Kitchen Testosterone 20 % CREA Apply 150 mg topically 2 (two) times daily.  . Turmeric 500 MG CAPS Take 500 mg by mouth daily.   . vardenafil (LEVITRA) 20 MG tablet Take 1 tablet (20 mg total) by mouth daily as needed for erectile dysfunction.  . vitamin C (ASCORBIC ACID) 500 MG tablet Take 500 mg by mouth daily.      Depression screen [facility] 2/9 12/11/2019 05/17/2019 09/24/2011  Decreased Interest 0 0 0  Down, Depressed, Hopeless 0 0 0  PHQ - 2 Score 0 0 0  Altered sleeping 0 - -  Tired, decreased energy 0 - -  Change in appetite 0 - -  Feeling bad or failure about yourself  0 - -  Trouble concentrating 0 - -  Moving slowly or fidgety/restless 0 - -  Suicidal thoughts 0 - -  PHQ-9 Score 0 - -  Difficult doing work/chores Not difficult at all - -     Objective:   Today's Vitals: There were no vitals taken for this visit. Vitals with BMI 01/01/2020 12/11/2019 11/22/2019  Height (No Data) 5\' 10"  5\' 10"   Weight (No Data) 208 lbs 3 oz 213 lbs 11 oz  BMI - 29.87 30.66  Systolic (No Data) 128 117  Diastolic (No Data) 82 73  Pulse - 76 85      Physical Exam   He appears to be alert and orientated and does not appear to be dyspneic on the phone.    Assessment   1. Cough       Tests ordered No orders of the defined types were placed in this encounter.    Plan: 1. He is well aware that this could simply be  acute viral illness which will resolve.  I am glad he is got COVID-19 testing and he will know more about this probably in the next 24 to 36 hours. 2. I will treat him empirically with Zithromax as this may be an atypical bacterial infection also.  If he does not improve, he will let me know. 3. This phone call lasted 5 minutes.   Meds ordered this encounter  Medications  . azithromycin (ZITHROMAX) 250 MG tablet    Sig: Take 2 tablets the first day and then 1 tablet every day for the next 4 days    Dispense:  6 tablet    Refill:  0    Riyan Haile Normajean Glasgow, MD

## 2020-01-08 ENCOUNTER — Other Ambulatory Visit: Payer: Self-pay | Admitting: Nurse Practitioner

## 2020-01-08 DIAGNOSIS — R0989 Other specified symptoms and signs involving the circulatory and respiratory systems: Secondary | ICD-10-CM

## 2020-01-31 ENCOUNTER — Ambulatory Visit (INDEPENDENT_AMBULATORY_CARE_PROVIDER_SITE_OTHER): Payer: Medicare PPO | Admitting: Urology

## 2020-01-31 ENCOUNTER — Encounter: Payer: Self-pay | Admitting: Urology

## 2020-01-31 ENCOUNTER — Other Ambulatory Visit: Payer: Self-pay

## 2020-01-31 VITALS — BP 112/73 | HR 94 | Temp 99.0°F | Ht 70.0 in | Wt 208.0 lb

## 2020-01-31 DIAGNOSIS — N5201 Erectile dysfunction due to arterial insufficiency: Secondary | ICD-10-CM

## 2020-01-31 LAB — URINALYSIS, ROUTINE W REFLEX MICROSCOPIC
Bilirubin, UA: NEGATIVE
Glucose, UA: NEGATIVE
Leukocytes,UA: NEGATIVE
Nitrite, UA: NEGATIVE
RBC, UA: NEGATIVE
Specific Gravity, UA: 1.02 (ref 1.005–1.030)
Urobilinogen, Ur: 0.2 mg/dL (ref 0.2–1.0)
pH, UA: 5 (ref 5.0–7.5)

## 2020-01-31 NOTE — Patient Instructions (Signed)
Erectile Dysfunction Erectile dysfunction (ED) is the inability to get or keep an erection in order to have sexual intercourse. ED is considered a symptom of an underlying disorder and not considered a disease. Erectile dysfunction may include:  Inability to get an erection.  Lack of enough hardness of the erection to allow penetration.  Loss of the erection before sex is finished. What are the causes? This condition may be caused by:  Certain medicines, such as: ? Pain relievers. ? Antihistamines. ? Antidepressants. ? Blood pressure medicines. ? Water pills (diuretics). ? Ulcer medicines. ? Muscle relaxants. ? Drugs.  Excessive drinking.  Psychological causes, such as: ? Anxiety. ? Depression. ? Sadness. ? Exhaustion. ? Performance fear. ? Stress.  Physical causes, such as: ? Artery problems. This may include diabetes, smoking, liver disease, or atherosclerosis. ? High blood pressure. ? Hormonal problems, such as low testosterone. ? Obesity. ? Nerve problems. This may include back or pelvic injuries, diabetes mellitus, multiple sclerosis, or Parkinson's disease. What are the signs or symptoms? Symptoms of this condition include:  Inability to get an erection.  Lack of enough hardness of the erection to allow penetration.  Loss of the erection before sex is finished.  Normal erections at some times, but with frequent unsatisfactory episodes.  Low sexual satisfaction in either partner due to erection problems.  A curved penis occurring with erection. The curve may cause pain or the penis may be too curved to allow for intercourse.  Never having nighttime erections. How is this diagnosed? This condition is often diagnosed by:  Performing a physical exam to find other diseases or specific problems with the penis.  Asking you detailed questions about the problem.  Performing blood tests to check for diabetes mellitus or to measure hormone levels.  Performing  other tests to check for underlying health conditions.  Performing an ultrasound exam to check for scarring.  Performing a test to check blood flow to the penis.  Doing a sleep study at home to measure nighttime erections. How is this treated? This condition may be treated by:  Medicine taken by mouth to help you achieve an erection (oral medicine).  Hormone replacement therapy to replace low testosterone levels.  Medicine that is injected into the penis. Your health care provider may instruct you how to give yourself these injections at home.  Vacuum pump. This is a pump with a ring on it. The pump and ring are placed on the penis and used to create pressure that helps the penis become erect.  Penile implant surgery. In this procedure, you may receive: ? An inflatable implant. This consists of cylinders, a pump, and a reservoir. The cylinders can be inflated with a fluid that helps to create an erection, and they can be deflated after intercourse. ? A semi-rigid implant. This consists of two silicone rubber rods. The rods provide some rigidity. They are also flexible, so the penis can both curve downward in its normal position and become straight for sexual intercourse.  Blood vessel surgery, to improve blood flow to the penis. During this procedure, a blood vessel from a different part of the body is placed into the penis to allow blood to flow around (bypass) damaged or blocked blood vessels.  Lifestyle changes, such as exercising more, losing weight, and quitting smoking. Follow these instructions at home: Medicines  Take over-the-counter and prescription medicines only as told by your health care provider. Do not increase the dosage without first discussing it with your health care   provider.  If you are using self-injections, perform injections as directed by your health care provider. Make sure to avoid any veins that are on the surface of the penis. After giving an injection,  apply pressure to the injection site for 5 minutes.   General instructions  Exercise regularly, as directed by your health care provider. Work with your health care provider to lose weight, if needed.  Do not use any products that contain nicotine or tobacco, such as cigarettes and e-cigarettes. If you need help quitting, ask your health care provider.  Before using a vacuum pump, read the instructions that come with the pump and discuss any questions with your health care provider.  Keep all follow-up visits as told by your health care provider. This is important. Contact a health care provider if:  You feel nauseous.  You vomit. Get help right away if:  You are taking oral or injectable medicines and you have an erection that lasts longer than 4 hours. If your health care provider is unavailable, go to the nearest emergency room for evaluation. An erection that lasts much longer than 4 hours can result in permanent damage to your penis.  You have severe pain in your groin or abdomen.  You develop redness or severe swelling of your penis.  You have redness spreading up into your groin or lower abdomen.  You are unable to urinate.  You experience chest pain or a rapid heart beat (palpitations) after taking oral medicines. Summary  Erectile dysfunction (ED) is the inability to get or keep an erection during sexual intercourse. This problem can usually be treated successfully.  This condition is diagnosed based on a physical exam, your symptoms, and tests to determine the cause. Treatment varies depending on the cause and may include medicines, hormone therapy, surgery, or a vacuum pump.  You may need follow-up visits to make sure that you are using your medicines or devices correctly.  Get help right away if you are taking or injecting medicines and you have an erection that lasts longer than 4 hours. This information is not intended to replace advice given to you by your health  care provider. Make sure you discuss any questions you have with your health care provider. Document Revised: 09/22/2019 Document Reviewed: 09/22/2019 Elsevier Patient Education  2021 Elsevier Inc.  

## 2020-01-31 NOTE — Progress Notes (Signed)
Urological Symptom Review  Patient is experiencing the following symptoms: Get up at night to urinate Erection problems (male only)   Review of Systems  Gastrointestinal (upper)  : Negative for upper GI symptoms  Gastrointestinal (lower) : Negative for lower GI symptoms  Constitutional : Negative  Skin: Negative for skin symptoms  Eyes: Negative for eye symptoms  Ear/Nose/Throat : Negative for Ear/Nose/Throat symptoms  Hematologic/Lymphatic: Negative for Hematologic/Lymphatic symptoms  Cardiovascular : Negative for cardiovascular symptoms  Respiratory : Negative for respiratory symptoms  Endocrine: Negative for endocrine symptoms  Musculoskeletal: Negative for musculoskeletal symptoms  Neurological: Negative for neurological symptoms  Psychologic: Negative for psychiatric symptoms

## 2020-01-31 NOTE — Progress Notes (Signed)
01/31/2020 11:23 AM   Austin Bright 10-Jun-1945 [id number]  Referring provider: Wilson Singer, MD [address] [place],  [place] [address]  Erectile dysfunction  HPI: Mr Austin Bright is a 74yo here for followup for erectile dysfunction. Last visit we trialed levitra but the medication was too expensive. He is currently on sildenafil 50mg  prn with good results. He is able to get a good erection with sildenafil.    PMH: Past Medical History:  Diagnosis Date  . Acute MI (HCC) 08/2011  . Arthritis   . Bilateral pneumonia    Diagnosed after STEMI 08/2011  . CAD (coronary artery disease)    a. Diagnosed 08/2011 with anterior STEMI due to thrombotic occlusion of mid LAD s/p thrombectomy, PTCA, DES placement 08/23/11.  10/23/11 CHF (congestive heart failure) (HCC)   . Dyslipidemia   . Gout   . Hypertension   . Ischemic cardiomyopathy    a. Initial EF 35% by cath 08/23/11, improved to 40-45% by echo 08/25/11.  10/25/11 Shortness of breath     Surgical History: Past Surgical History:  Procedure Laterality Date  . carpel tunnel Bilateral 2002  . COLONOSCOPY WITH PROPOFOL N/A 04/08/2017   Procedure: COLONOSCOPY WITH PROPOFOL;  Surgeon: 04/10/2017, MD;  [facility];  Service: Endoscopy;  Laterality: N/A;  11:15am  . CORONARY STENT PLACEMENT    . CYSTECTOMY  1969   pilonidal cyst  . LEFT AND RIGHT HEART CATHETERIZATION WITH CORONARY ANGIOGRAM N/A 09/15/2011   Procedure: LEFT AND RIGHT HEART CATHETERIZATION WITH CORONARY ANGIOGRAM;  Surgeon: 09/17/2011, MD;  Location: [facility] CATH LAB;  Service: Cardiovascular;  Laterality: N/A;  . LEFT HEART CATH N/A 08/23/2011   Procedure: LEFT HEART CATH;  Surgeon: 10/23/2011, MD;  Location: [facility] CATH LAB;  Service: Cardiovascular;  Laterality: N/A;  . NO PAST SURGERIES    . PERCUTANEOUS CORONARY STENT INTERVENTION (PCI-S)  08/23/2011   Procedure: PERCUTANEOUS CORONARY STENT INTERVENTION (PCI-S);  Surgeon: 10/23/2011, MD;  Location: [facility] CATH  LAB;  Service: Cardiovascular;;    Home Medications:  Allergies as of 01/31/2020   No Known Allergies     Medication List       Accurate as of January 31, 2020 11:23 AM. If you have any questions, ask your nurse or doctor.        allopurinol 300 MG tablet Commonly known as: ZYLOPRIM TAKE 1 TABLET BY MOUTH ONCE DAILY FOR GOUT.   Alpha Lipoic Acid 200 MG Caps Take 200 mg by mouth daily.   aspirin EC 81 MG tablet Take 81 mg by mouth daily.   azithromycin 250 MG tablet Commonly known as: ZITHROMAX Take 2 tablets the first day and then 1 tablet every day for the next 4 days   CENTRUM SILVER 50+MEN PO Take 1 tablet by mouth daily.   chlorthalidone 25 MG tablet Commonly known as: HYGROTON TAKE 1/2 TABLET BY MOUTH ONCE DAILY.   CINNAMON PO Take 1 capsule by mouth daily.   Co Q-10 100 MG Caps Take 100 mg by mouth daily.   fluticasone 50 MCG/ACT nasal spray Commonly known as: FLONASE Place 2 sprays into both nostrils daily for 14 days.   Green Tea 150 MG Caps Take 150 mg by mouth daily.   lisinopril 20 MG tablet Commonly known as: ZESTRIL Take 1 tablet (20 mg total) by mouth daily.   metoprolol succinate 25 MG 24 hr tablet Commonly known as: Toprol XL Take 1 tablet (25  mg total) by mouth daily.   naproxen 500 MG tablet Commonly known as: Naprosyn Take 1 tablet (500 mg total) by mouth 2 (two) times daily with a meal.   nitroGLYCERIN 0.4 MG SL tablet Commonly known as: NITROSTAT PLACE 1 TAB UNDER TONGUE EVERY 5 MIN IF NEEDED FOR CHEST PAIN. MAY USE 3 TIMES.NO RELIEF CALL 911.   Resveratrol 100 MG Caps Take 100 mg by mouth daily.   rosuvastatin 40 MG tablet Commonly known as: CRESTOR Take 1 tablet (40 mg total) by mouth daily.   sildenafil 100 MG tablet Commonly known as: VIAGRA TAKE (1) TABLET BY MOUTH AS NEEDED FOR ERECTILE DYSFUNCTION.   Testosterone 20 % Crea Apply 150 mg topically 2 (two) times daily.   Turmeric 500 MG Caps Take 500 mg by mouth  daily.   vardenafil 20 MG tablet Commonly known as: LEVITRA Take 1 tablet (20 mg total) by mouth daily as needed for erectile dysfunction.   vitamin C 500 MG tablet Commonly known as: ASCORBIC ACID Take 500 mg by mouth daily.       Allergies: No Known Allergies  Family History: Family History  Problem Relation Age of Onset  . Hypertension Father   . Stroke Mother   . Colon cancer Neg Hx     Social History:  reports that he quit smoking about 40 years ago. His smoking use included cigarettes. He has a 25.00 pack-year smoking history. He has never used smokeless tobacco. He reports current alcohol use of about 4.0 standard drinks of alcohol per week. He reports that he does not use drugs.  ROS: All other review of systems were reviewed and are negative except what is noted above in HPI  Physical Exam: BP 112/73   Pulse 94   Temp 99 F (37.2 C)   Ht 5\' 10"  (1.778 m)   Wt 208 lb (94.3 kg)   BMI 29.84 kg/m   Constitutional:  Alert and oriented, No acute distress. HEENT: [place] AT, moist mucus membranes.  Trachea midline, no masses. Cardiovascular: No clubbing, cyanosis, or edema. Respiratory: Normal respiratory effort, no increased work of breathing. GI: Abdomen is soft, nontender, nondistended, no abdominal masses GU: No CVA tenderness.  Lymph: No cervical or inguinal lymphadenopathy. Skin: No rashes, bruises or suspicious lesions. Neurologic: Grossly intact, no focal deficits, moving all 4 extremities. Psychiatric: Normal mood and affect.  Laboratory Data: Lab Results  Component Value Date   WBC 7.2 10/21/2018   HGB 16.7 10/21/2018   HCT 49.4 10/21/2018   MCV 95.2 10/21/2018   PLT 164 10/21/2018    Lab Results  Component Value Date   CREATININE 0.98 05/17/2019    Lab Results  Component Value Date   PSA 2.1 05/31/2019    Lab Results  Component Value Date   TESTOSTERONE 1,219 (H) 10/12/2019    Lab Results  Component Value Date   HGBA1C 5.2 05/17/2019     Urinalysis    Component Value Date/Time   COLORURINE YELLOW 10/21/2018 0840   APPEARANCEUR CLEAR 10/21/2018 0840   LABSPEC 1.015 10/21/2018 0840   PHURINE 6.0 10/21/2018 0840   GLUCOSEU NEGATIVE 10/21/2018 0840   HGBUR NEGATIVE 10/21/2018 0840   BILIRUBINUR NEGATIVE 10/21/2018 0840   KETONESUR NEGATIVE 10/21/2018 0840   PROTEINUR NEGATIVE 10/21/2018 0840   NITRITE NEGATIVE 10/21/2018 0840   LEUKOCYTESUR NEGATIVE 10/21/2018 0840    No results found for: LABMICR, WBCUA, RBCUA, LABEPIT, MUCUS, BACTERIA  Pertinent Imaging:  No results found for this or any previous visit.  No results found for this or any previous visit.  No results found for this or any previous visit.  No results found for this or any previous visit.  Results for orders placed during the hospital encounter of 04/22/16  US Renal  Narrative CLINICAL DATA:  Hypertension.  EXAM: RENAL / URINARY TRACT ULTRASOUND COMPLETE  COMPARISON:  01/05/2011  FINDINGS: Right Kidney:  Length: 11.7 cm. Echogenicity within normal limits. No mass or hydronephrosis visualized.  Left Kidney:  Length: 12.1 cm. Echogenicity within normal limits. No mass or hydronephrosis visualized.  Bladder:  Appears normal for degree of bladder distention. Bilateral ureteral jets.  IMPRESSION: Normal urinary tract ultrasound.   Electronically Signed By: Paulina Fusi M.D. On: 04/22/2016 13:39  No results found for this or any previous visit.  No results found for this or any previous visit.  No results found for this or any previous visit.   Assessment & Plan:    1. Erectile dysfunction due to arterial insufficiency -Patient to continue sildenafil 50mg  daily. Patient to inquire with apothicary for troches - Urinalysis, Routine w reflex microscopic   No follow-ups on file.  [place], MD  [facility] [place]

## 2020-02-19 ENCOUNTER — Other Ambulatory Visit: Payer: Self-pay | Admitting: Cardiology

## 2020-02-27 DIAGNOSIS — L821 Other seborrheic keratosis: Secondary | ICD-10-CM | POA: Diagnosis not present

## 2020-02-27 DIAGNOSIS — L304 Erythema intertrigo: Secondary | ICD-10-CM | POA: Diagnosis not present

## 2020-02-27 DIAGNOSIS — L738 Other specified follicular disorders: Secondary | ICD-10-CM | POA: Diagnosis not present

## 2020-04-03 DIAGNOSIS — L918 Other hypertrophic disorders of the skin: Secondary | ICD-10-CM | POA: Diagnosis not present

## 2020-04-03 DIAGNOSIS — L720 Epidermal cyst: Secondary | ICD-10-CM | POA: Diagnosis not present

## 2020-04-06 ENCOUNTER — Other Ambulatory Visit (INDEPENDENT_AMBULATORY_CARE_PROVIDER_SITE_OTHER): Payer: Self-pay | Admitting: Nurse Practitioner

## 2020-05-08 ENCOUNTER — Other Ambulatory Visit (INDEPENDENT_AMBULATORY_CARE_PROVIDER_SITE_OTHER): Payer: Self-pay | Admitting: Internal Medicine

## 2020-05-08 ENCOUNTER — Encounter (INDEPENDENT_AMBULATORY_CARE_PROVIDER_SITE_OTHER): Payer: Self-pay | Admitting: Internal Medicine

## 2020-05-08 MED ORDER — TESTOSTERONE 20 % CREA: 150.0000 mg | TOPICAL_CREAM | Freq: Two times a day (BID) | 2 refills | Status: DC

## 2020-05-14 ENCOUNTER — Other Ambulatory Visit (INDEPENDENT_AMBULATORY_CARE_PROVIDER_SITE_OTHER): Payer: Self-pay | Admitting: Internal Medicine

## 2020-05-14 ENCOUNTER — Other Ambulatory Visit: Payer: Self-pay | Admitting: Cardiology

## 2020-05-30 ENCOUNTER — Encounter (INDEPENDENT_AMBULATORY_CARE_PROVIDER_SITE_OTHER): Payer: Self-pay | Admitting: Internal Medicine

## 2020-05-30 ENCOUNTER — Other Ambulatory Visit (INDEPENDENT_AMBULATORY_CARE_PROVIDER_SITE_OTHER): Payer: Self-pay | Admitting: Internal Medicine

## 2020-05-30 MED ORDER — ROSUVASTATIN CALCIUM 40 MG PO TABS: 40.0000 mg | ORAL_TABLET | Freq: Every day | ORAL | 1 refills | Status: DC

## 2020-06-10 ENCOUNTER — Encounter (INDEPENDENT_AMBULATORY_CARE_PROVIDER_SITE_OTHER): Payer: Self-pay | Admitting: Internal Medicine

## 2020-06-10 ENCOUNTER — Other Ambulatory Visit: Payer: Self-pay

## 2020-06-10 ENCOUNTER — Ambulatory Visit (INDEPENDENT_AMBULATORY_CARE_PROVIDER_SITE_OTHER): Payer: Medicare PPO | Admitting: Internal Medicine

## 2020-06-10 VITALS — BP 128/82 | HR 88 | Temp 98.5°F | Resp 18 | Ht 70.0 in | Wt 205.0 lb

## 2020-06-10 DIAGNOSIS — M1000 Idiopathic gout, unspecified site: Secondary | ICD-10-CM

## 2020-06-10 DIAGNOSIS — E785 Hyperlipidemia, unspecified: Secondary | ICD-10-CM

## 2020-06-10 DIAGNOSIS — N5201 Erectile dysfunction due to arterial insufficiency: Secondary | ICD-10-CM | POA: Diagnosis not present

## 2020-06-10 DIAGNOSIS — Z125 Encounter for screening for malignant neoplasm of prostate: Secondary | ICD-10-CM

## 2020-06-10 DIAGNOSIS — I10 Essential (primary) hypertension: Secondary | ICD-10-CM | POA: Diagnosis not present

## 2020-06-10 DIAGNOSIS — R21 Rash and other nonspecific skin eruption: Secondary | ICD-10-CM | POA: Diagnosis not present

## 2020-06-10 NOTE — Progress Notes (Signed)
Metrics: Intervention Frequency ACO  Documented Smoking Status Yearly  Screened one or more times in 24 months  Cessation Counseling or  Active cessation medication Past 24 months  Past 24 months   Guideline developer: UpToDate (See UpToDate for funding source) Date Released: 2014       Wellness Office Visit  Subjective:  Patient ID: Austin Bright, male    DOB: Aug 11, 1945  Age: 75 y.o. MRN: [id number]  CC: This very pleasant man comes in for follow-up of dyslipidemia, gout, erectile dysfunction, testosterone therapy, hypertension and coronary artery disease. HPI  Is doing very well.  He continues on testosterone therapy 3 clicks twice a day and is now consistent with this. As far as his coronary artery disease is concerned, this does not appear to be an issue at the present time.  He has no symptoms. His erectile dysfunction also is in a good state and Viagra seems to help him.  His libido is significantly elevated at the present time. He keeps active. He is able to sleep at night, all night, if he takes some whiskey, only a couple of ounces every night.  I told him this would be okay.  I also recommended he consider melatonin from life extension. Past Medical History:  Diagnosis Date  . Acute MI (HCC) 08/2011  . Arthritis   . Bilateral pneumonia    Diagnosed after STEMI 08/2011  . CAD (coronary artery disease)    a. Diagnosed 08/2011 with anterior STEMI due to thrombotic occlusion of mid LAD s/p thrombectomy, PTCA, DES placement 08/23/11.  Marland Kitchen CHF (congestive heart failure) (HCC)   . Dyslipidemia   . Gout   . Hypertension   . Ischemic cardiomyopathy    a. Initial EF 35% by cath 08/23/11, improved to 40-45% by echo 08/25/11.  Marland Kitchen Shortness of breath    Past Surgical History:  Procedure Laterality Date  . carpel tunnel Bilateral 2002  . COLONOSCOPY WITH PROPOFOL N/A 04/08/2017   Procedure: COLONOSCOPY WITH PROPOFOL;  Surgeon: Corbin Ade, MD;  [facility]: [facility];  Service:  Endoscopy;  Laterality: N/A;  11:15am  . CORONARY STENT PLACEMENT    . CYSTECTOMY  1969   pilonidal cyst  . LEFT AND RIGHT HEART CATHETERIZATION WITH CORONARY ANGIOGRAM N/A 09/15/2011   Procedure: LEFT AND RIGHT HEART CATHETERIZATION WITH CORONARY ANGIOGRAM;  Surgeon: Herby Abraham, MD;  Location: [facility] CATH LAB;  Service: Cardiovascular;  Laterality: N/A;  . LEFT HEART CATH N/A 08/23/2011   Procedure: LEFT HEART CATH;  Surgeon: Iran Ouch, MD;  Location: [facility] CATH LAB;  Service: Cardiovascular;  Laterality: N/A;  . NO PAST SURGERIES    . PERCUTANEOUS CORONARY STENT INTERVENTION (PCI-S)  08/23/2011   Procedure: PERCUTANEOUS CORONARY STENT INTERVENTION (PCI-S);  Surgeon: Iran Ouch, MD;  Location: [facility] CATH LAB;  Service: Cardiovascular;;     Family History  Problem Relation Age of Onset  . Hypertension Father   . Stroke Mother   . 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   Social History   Tobacco Use  . Smoking status: Former Smoker    Packs/day: 1.00    Years: 25.00    Pack years: 25.00    Types: Cigarettes    Quit date: 08/23/1979    Years since quitting: 40.8  . Smokeless tobacco: Never Used  Substance Use  Topics  . Alcohol use: Yes    Alcohol/week: 4.0 standard drinks    Types: 2 Cans of beer, 2 Shots of liquor per week    Comment: Weekends up to 3 times a week; 2-3 drinks per sitting.    Current Meds  Medication Sig  . allopurinol (ZYLOPRIM) 300 MG tablet TAKE 1 TABLET BY MOUTH ONCE DAILY FOR GOUT.  Marland Kitchen Alpha Lipoic Acid 200 MG CAPS Take 200 mg by mouth daily.   Marland Kitchen aspirin EC 81 MG tablet Take 81 mg by mouth daily.  . chlorthalidone (HYGROTON) 25 MG tablet TAKE 1/2 TABLET BY MOUTH ONCE DAILY.  Marland Kitchen CINNAMON PO Take 1 capsule by mouth daily.  . Coenzyme Q10 (CO Q-10) 100 MG CAPS Take 100 mg by mouth daily.   [place] Si Tea 150 MG CAPS Take 150 mg by mouth daily.   Marland Kitchen lisinopril (ZESTRIL) 20 MG tablet TAKE ONE TABLET BY MOUTH ONCE DAILY.  . metoprolol succinate (TOPROL XL) 25 MG 24 hr tablet Take 1 tablet (25 mg total) by mouth daily.  . Multiple Vitamins-Minerals (CENTRUM SILVER 50+MEN PO) Take 1 tablet by mouth daily.  . naproxen (NAPROSYN) 500 MG tablet Take 1 tablet (500 mg total) by mouth 2 (two) times daily with a meal.  . nitroGLYCERIN (NITROSTAT) 0.4 MG SL tablet PLACE 1 TAB UNDER TONGUE EVERY 5 MIN IF NEEDED FOR CHEST PAIN. MAY USE 3 TIMES.NO RELIEF CALL 911.  . rosuvastatin (CRESTOR) 40 MG tablet Take 1 tablet (40 mg total) by mouth daily.  . sildenafil (VIAGRA) 100 MG tablet TAKE (1) TABLET BY MOUTH AS NEEDED FOR ERECTILE DYSFUNCTION.  Marland Kitchen Testosterone 20 % CREA Apply 150 mg topically 2 (two) times daily.  . Turmeric 500 MG CAPS Take 500 mg by mouth daily.   . vardenafil (LEVITRA) 20 MG tablet Take 1 tablet (20 mg total) by mouth daily as needed for erectile dysfunction.  . vitamin C (ASCORBIC ACID) 500 MG tablet Take 500 mg by mouth daily.     Flowsheet Row Office Visit from 12/11/2019 in [facility]  PHQ-9 Total Score 0      Objective:   Today's Vitals: BP 128/82 (Patient Position: Sitting, Cuff Size: Normal) Comment (BP Location): 121/76 on rt arm.  Pulse 88   Temp 98.5 F (36.9 C) (Temporal)   Resp 18   Ht 5\' 10"  (1.778 m)   Wt 205 lb (93 kg)   SpO2 99% Comment: wearing k-95 mask.  BMI 29.41 kg/m  Vitals with BMI 06/10/2020 01/31/2020 01/01/2020  Height 5\' 10"  5\' 10"  (No Data)  Weight 205 lbs 208 lbs (No Data)  BMI 29.41 29.84 -  Systolic 128 112 (No Data)  Diastolic 82 73 (No Data)  Pulse 88 94 -     Physical Exam  He looks systemically well and good for his age.  He has lost about 3 pounds.  Blood pressure is in a good range.  Alert and orientated without any focal neurological signs.     Assessment   1. Dyslipidemia   2. Idiopathic gout, unspecified  chronicity, unspecified site   3. Erectile dysfunction due to arterial insufficiency   4. Skin rash   5. Special screening for malignant neoplasm of prostate   6. Essential hypertension, benign       Tests ordered Orders Placed This Encounter  Procedures  . COMPLETE METABOLIC PANEL WITH GFR  . PSA, Total with Reflex to PSA, Free     Plan: 1. Continue with  antihypertensive medication as before. 2. Continue with testosterone therapy for erectile dysfunction. 3. Continue with statin therapy for his dyslipidemia.  We have discussed the drawbacks of statin therapy in general in the past also. 4. I will see him in 6 months time for an annual physical exam.   No orders of the defined types were placed in this encounter.   Wilson Singer, MD

## 2020-06-11 LAB — COMPLETE METABOLIC PANEL WITH GFR
AG Ratio: 1.7 (calc) (ref 1.0–2.5)
ALT: 20 U/L (ref 9–46)
AST: 21 U/L (ref 10–35)
Albumin: 4.5 g/dL (ref 3.6–5.1)
Alkaline phosphatase (APISO): 50 U/L (ref 35–144)
BUN/Creatinine Ratio: 19 (calc) (ref 6–22)
BUN: 24 mg/dL (ref 7–25)
CO2: 28 mmol/L (ref 20–32)
Calcium: 9.8 mg/dL (ref 8.6–10.3)
Chloride: 100 mmol/L (ref 98–110)
Creat: 1.25 mg/dL — ABNORMAL HIGH (ref 0.70–1.18)
GFR, Est African American: 66 mL/min/{1.73_m2} (ref >=60)
GFR, Est Non African American: 57 mL/min/{1.73_m2} — ABNORMAL LOW (ref >=60)
Globulin: 2.7 g/dL (calc) (ref 1.9–3.7)
Glucose, Bld: 96 mg/dL (ref 65–139)
Potassium: 4.5 mmol/L (ref 3.5–5.3)
Sodium: 140 mmol/L (ref 135–146)
Total Bilirubin: 0.9 mg/dL (ref 0.2–1.2)
Total Protein: 7.2 g/dL (ref 6.1–8.1)

## 2020-06-11 LAB — PSA, TOTAL WITH REFLEX TO PSA, FREE: PSA, Total: 1.6 ng/mL (ref <=4.0)

## 2020-07-18 ENCOUNTER — Other Ambulatory Visit (INDEPENDENT_AMBULATORY_CARE_PROVIDER_SITE_OTHER): Payer: Self-pay | Admitting: Internal Medicine

## 2020-07-31 ENCOUNTER — Ambulatory Visit: Payer: Medicare PPO | Admitting: Urology

## 2020-08-03 ENCOUNTER — Other Ambulatory Visit (INDEPENDENT_AMBULATORY_CARE_PROVIDER_SITE_OTHER): Payer: Self-pay | Admitting: Internal Medicine

## 2020-08-16 ENCOUNTER — Other Ambulatory Visit (INDEPENDENT_AMBULATORY_CARE_PROVIDER_SITE_OTHER): Payer: Self-pay | Admitting: Internal Medicine

## 2020-08-17 ENCOUNTER — Emergency Department (HOSPITAL_COMMUNITY): Payer: Medicare PPO

## 2020-08-17 ENCOUNTER — Encounter (HOSPITAL_COMMUNITY): Payer: Self-pay | Admitting: Emergency Medicine

## 2020-08-17 ENCOUNTER — Inpatient Hospital Stay (HOSPITAL_COMMUNITY)
Admission: EM | Admit: 2020-08-17 | Discharge: 2020-08-19 | DRG: 872 | Disposition: A | Discharge status: Home / Self Care | Payer: Medicare PPO | Attending: Internal Medicine | Admitting: Internal Medicine

## 2020-08-17 ENCOUNTER — Other Ambulatory Visit: Payer: Self-pay

## 2020-08-17 DIAGNOSIS — K5792 Diverticulitis of intestine, part unspecified, without perforation or abscess without bleeding: Secondary | ICD-10-CM | POA: Diagnosis not present

## 2020-08-17 DIAGNOSIS — R739 Hyperglycemia, unspecified: Secondary | ICD-10-CM | POA: Diagnosis present

## 2020-08-17 DIAGNOSIS — I255 Ischemic cardiomyopathy: Secondary | ICD-10-CM | POA: Diagnosis present

## 2020-08-17 DIAGNOSIS — E860 Dehydration: Secondary | ICD-10-CM | POA: Diagnosis not present

## 2020-08-17 DIAGNOSIS — R17 Unspecified jaundice: Secondary | ICD-10-CM | POA: Diagnosis not present

## 2020-08-17 DIAGNOSIS — I10 Essential (primary) hypertension: Secondary | ICD-10-CM

## 2020-08-17 DIAGNOSIS — R1030 Lower abdominal pain, unspecified: Secondary | ICD-10-CM

## 2020-08-17 DIAGNOSIS — Z87891 Personal history of nicotine dependence: Secondary | ICD-10-CM | POA: Diagnosis not present

## 2020-08-17 DIAGNOSIS — Z79899 Other long term (current) drug therapy: Secondary | ICD-10-CM | POA: Diagnosis not present

## 2020-08-17 DIAGNOSIS — I252 Old myocardial infarction: Secondary | ICD-10-CM | POA: Diagnosis not present

## 2020-08-17 DIAGNOSIS — Z955 Presence of coronary angioplasty implant and graft: Secondary | ICD-10-CM

## 2020-08-17 DIAGNOSIS — Z20822 Contact with and (suspected) exposure to covid-19: Secondary | ICD-10-CM | POA: Diagnosis not present

## 2020-08-17 DIAGNOSIS — M109 Gout, unspecified: Secondary | ICD-10-CM | POA: Diagnosis present

## 2020-08-17 DIAGNOSIS — N179 Acute kidney failure, unspecified: Secondary | ICD-10-CM | POA: Diagnosis present

## 2020-08-17 DIAGNOSIS — R160 Hepatomegaly, not elsewhere classified: Secondary | ICD-10-CM

## 2020-08-17 DIAGNOSIS — I5042 Chronic combined systolic (congestive) and diastolic (congestive) heart failure: Secondary | ICD-10-CM | POA: Diagnosis not present

## 2020-08-17 DIAGNOSIS — R109 Unspecified abdominal pain: Secondary | ICD-10-CM

## 2020-08-17 DIAGNOSIS — I110 Hypertensive heart disease with heart failure: Secondary | ICD-10-CM | POA: Diagnosis present

## 2020-08-17 DIAGNOSIS — D696 Thrombocytopenia, unspecified: Secondary | ICD-10-CM

## 2020-08-17 DIAGNOSIS — K5720 Diverticulitis of large intestine with perforation and abscess without bleeding: Secondary | ICD-10-CM | POA: Diagnosis not present

## 2020-08-17 DIAGNOSIS — M1990 Unspecified osteoarthritis, unspecified site: Secondary | ICD-10-CM | POA: Diagnosis present

## 2020-08-17 DIAGNOSIS — R197 Diarrhea, unspecified: Secondary | ICD-10-CM

## 2020-08-17 DIAGNOSIS — E861 Hypovolemia: Secondary | ICD-10-CM | POA: Diagnosis present

## 2020-08-17 DIAGNOSIS — Z8249 Family history of ischemic heart disease and other diseases of the circulatory system: Secondary | ICD-10-CM

## 2020-08-17 DIAGNOSIS — E785 Hyperlipidemia, unspecified: Secondary | ICD-10-CM | POA: Diagnosis present

## 2020-08-17 DIAGNOSIS — R6510 Systemic inflammatory response syndrome (SIRS) of non-infectious origin without acute organ dysfunction: Secondary | ICD-10-CM

## 2020-08-17 DIAGNOSIS — Z888 Allergy status to other drugs, medicaments and biological substances status: Secondary | ICD-10-CM | POA: Diagnosis not present

## 2020-08-17 DIAGNOSIS — I509 Heart failure, unspecified: Secondary | ICD-10-CM

## 2020-08-17 DIAGNOSIS — A419 Sepsis, unspecified organism: Secondary | ICD-10-CM | POA: Diagnosis not present

## 2020-08-17 DIAGNOSIS — I2510 Atherosclerotic heart disease of native coronary artery without angina pectoris: Secondary | ICD-10-CM | POA: Diagnosis present

## 2020-08-17 DIAGNOSIS — D72829 Elevated white blood cell count, unspecified: Secondary | ICD-10-CM

## 2020-08-17 HISTORY — DX: Diverticulitis of intestine, part unspecified, without perforation or abscess without bleeding: K57.92

## 2020-08-17 LAB — CBC
HCT: 59.4 % — ABNORMAL HIGH (ref 39.0–52.0)
Hemoglobin: 20.2 g/dL — ABNORMAL HIGH (ref 13.0–17.0)
MCH: 32.7 pg (ref 26.0–34.0)
MCHC: 34 g/dL (ref 30.0–36.0)
MCV: 96.1 fL (ref 80.0–100.0)
Platelets: 155 10*3/uL (ref 150–400)
RBC: 6.18 MIL/uL — ABNORMAL HIGH (ref 4.22–5.81)
RDW: 14.1 % (ref 11.5–15.5)
WBC: 16.1 10*3/uL — ABNORMAL HIGH (ref 4.0–10.5)
nRBC: 0 % (ref 0.0–0.2)

## 2020-08-17 LAB — URINALYSIS, ROUTINE W REFLEX MICROSCOPIC
Bacteria, UA: NONE SEEN
Bilirubin Urine: NEGATIVE
Glucose, UA: NEGATIVE mg/dL
Hgb urine dipstick: NEGATIVE
Ketones, ur: 5 mg/dL — AB
Leukocytes,Ua: NEGATIVE
Nitrite: NEGATIVE
Protein, ur: 100 mg/dL — AB
Specific Gravity, Urine: 1.024 (ref 1.005–1.030)
pH: 6 (ref 5.0–8.0)

## 2020-08-17 LAB — COMPREHENSIVE METABOLIC PANEL
ALT: 20 U/L (ref 0–44)
AST: 22 U/L (ref 15–41)
Albumin: 4.3 g/dL (ref 3.5–5.0)
Alkaline Phosphatase: 75 U/L (ref 38–126)
Anion gap: 9 (ref 5–15)
BUN: 23 mg/dL (ref 8–23)
CO2: 29 mmol/L (ref 22–32)
Calcium: 9.1 mg/dL (ref 8.9–10.3)
Chloride: 97 mmol/L — ABNORMAL LOW (ref 98–111)
Creatinine, Ser: 1.49 mg/dL — ABNORMAL HIGH (ref 0.61–1.24)
GFR, Estimated: 49 mL/min — ABNORMAL LOW (ref >60)
Glucose, Bld: 159 mg/dL — ABNORMAL HIGH (ref 70–99)
Potassium: 3.9 mmol/L (ref 3.5–5.1)
Sodium: 135 mmol/L (ref 135–145)
Total Bilirubin: 1.7 mg/dL — ABNORMAL HIGH (ref 0.3–1.2)
Total Protein: 8.1 g/dL (ref 6.5–8.1)

## 2020-08-17 LAB — LIPASE, BLOOD: Lipase: 30 U/L (ref 11–51)

## 2020-08-17 MED ORDER — PIPERACILLIN-TAZOBACTAM 3.375 G IVPB 30 MIN
3.3750 g | Freq: Once | INTRAVENOUS | Status: AC
  Administered 2020-08-18: 3.375 g via INTRAVENOUS
  Filled 2020-08-17: qty 50

## 2020-08-17 MED ORDER — IOHEXOL 300 MG/ML  SOLN
100.0000 mL | Freq: Once | INTRAMUSCULAR | Status: AC | PRN
  Administered 2020-08-17: 100 mL via INTRAVENOUS

## 2020-08-17 MED ORDER — ONDANSETRON HCL 4 MG/2ML IJ SOLN
4.0000 mg | Freq: Once | INTRAMUSCULAR | Status: AC
  Administered 2020-08-17: 4 mg via INTRAVENOUS
  Filled 2020-08-17: qty 2

## 2020-08-17 MED ORDER — SODIUM CHLORIDE 0.9 % IV BOLUS
1000.0000 mL | Freq: Once | INTRAVENOUS | Status: AC
  Administered 2020-08-17: 1000 mL via INTRAVENOUS

## 2020-08-17 MED ORDER — MORPHINE SULFATE (PF) 4 MG/ML IV SOLN: 4.0000 mg | Freq: Once | INTRAVENOUS | Status: DC

## 2020-08-17 MED ORDER — ACETAMINOPHEN 500 MG PO TABS
1000.0000 mg | ORAL_TABLET | Freq: Once | ORAL | Status: AC
Start: 2020-08-17 — End: 2020-08-17
  Administered 2020-08-17: 1000 mg via ORAL
  Filled 2020-08-17: qty 2

## 2020-08-17 MED ORDER — SODIUM CHLORIDE 0.9 % IV SOLN: Freq: Once | INTRAVENOUS | Status: AC

## 2020-08-17 NOTE — H&P (Signed)
History and Physical  Austin Bright OVF:[id number] DOB: [birth year] DOA: 08/17/2020  Referring physician: Karrie Meres, PA-C  PCP: Wilson Singer, MD  Patient coming from: Home  Chief Complaint: Abdominal pain  HPI: Austin Bright is a 75 y.o. male with medical history significant for hypertension, hyperlipidemia, CAD, CHF and ischemic cardiomyopathy who presents to the emergency department due to 2-day onset of abdominal pain.  Abdominal pain was in the lower abdominal quadrants, it was crampy in nature and was rated as 5/10 on pain scale, this was associated with about 10 episodes of diarrhea daily.  He denies chest pain, shortness of breath, nausea, vomiting or blood in stool.  ED Course:  In the emergency department, he was febrile with a temperature of 102F, he was tachycardic, BP was 165/99.  Work-up in the ED showed leukocytosis, elevated H&H (20.2/59.4), BUN/creatinine 23/1.49 (baseline creatinine at 1.0-1.3) and hyperglycemia, total bilirubin 1.7, lipase 30, urinalysis was unimpressive for UTI, lactic acid 1.2.  Influenza a, B, SARS coronavirus 2 was negative. CT abdomen and pelvis showed complicated distal sigmoid acute diverticulitis with associated microperforation. Tylenol was given, Zofran was given, patient was started on IV Zosyn and IV hydration was provided. General surgery (Dr. Henreitta Leber) was consulted and recommended antibiotics per ED PA.  Hospitalist was asked to admit patient for further evaluation and management.  Review of Systems: Constitutional: Positive for fever.  HENT: Negative for ear pain and sore throat.   Eyes: Negative for pain and visual disturbance.  Respiratory: Negative for cough, chest tightness and shortness of breath.   Cardiovascular: Negative for chest pain and palpitations.  Gastrointestinal: Positive for abdominal pain and diarrhea. Endocrine: Negative for polyphagia and polyuria.  Genitourinary: Negative for decreased urine volume,  dysuria, enuresis Musculoskeletal: Negative for arthralgias and back pain.  Skin: Negative for color change and rash.  Allergic/Immunologic: Negative for immunocompromised state.  Neurological: Negative for tremors, syncope, speech difficulty Hematological: Does not bruise/bleed easily.  All other systems reviewed and are negative   Past Medical History:  Diagnosis Date   Acute MI (HCC) 08/2011   Arthritis    Bilateral pneumonia    Diagnosed after STEMI 08/2011   CAD (coronary artery disease)    a. Diagnosed 08/2011 with anterior STEMI due to thrombotic occlusion of mid LAD s/p thrombectomy, PTCA, DES placement 08/23/11.   CHF (congestive heart failure) (HCC)    Dyslipidemia    Gout    Hypertension    Ischemic cardiomyopathy    a. Initial EF 35% by cath 08/23/11, improved to 40-45% by echo 08/25/11.   Shortness of breath    Past Surgical History:  Procedure Laterality Date   carpel tunnel Bilateral 2002   COLONOSCOPY WITH PROPOFOL N/A 04/08/2017   Procedure: COLONOSCOPY WITH PROPOFOL;  Surgeon: Corbin Ade, MD;  Location: [facility];  Service: Endoscopy;  Laterality: N/A;  11:15am   CORONARY STENT PLACEMENT     CYSTECTOMY  1969   pilonidal cyst   LEFT AND RIGHT HEART CATHETERIZATION WITH CORONARY ANGIOGRAM N/A 09/15/2011   Procedure: LEFT AND RIGHT HEART CATHETERIZATION WITH CORONARY ANGIOGRAM;  Surgeon: Herby Abraham, MD;  Location[facility] CATH LAB;  Service: Cardiovascular;  Laterality: N/A;   LEFT HEART CATH N/A 08/23/2011   Procedure: LEFT HEART CATH;  Surgeon: Iran Ouch, MD;  Location: [facility] CATH LAB;  Service: Cardiovascular;  Laterality: N/A;   NO PAST SURGERIES     PERCUTANEOUS CORONARY STENT INTERVENTION (PCI-S)  08/23/2011  Procedure: PERCUTANEOUS CORONARY STENT INTERVENTION (PCI-S);  Surgeon: Iran Ouch, MD;  Location: [facility] C[facility] LAB;  Service: Cardiovascular;;    Social History:  reports that he quit smoking about 41 years ago. His smoking use included  cigarettes. He has a 25.00 pack-year smoking history. He has never used smokeless tobacco. He reports current alcohol use of about 4.0 standard drinks of alcohol per week. He reports that he does not use drugs.   Allergies  Allergen Reactions   Flonase [Fluticasone Propionate] Other (See Comments)    Nose bleeds.    Family History  Problem Relation Age of Onset   Hypertension Father    Stroke Mother    Colon cancer Neg Hx      Prior to Admission medications   Medication Sig Start Date End Date Taking? Authorizing Provider  allopurinol (ZYLOPRIM) 300 MG tablet TAKE 1 TABLET BY MOUTH ONCE DAILY FOR GOUT. 07/18/20   Elenore Paddy, NP  Alpha Lipoic Acid 200 MG CAPS Take 200 mg by mouth daily.     [provider]  aspirin EC 81 MG tablet Take 81 mg by mouth daily.    [provider]  chlorthalidone (HYGROTON) 25 MG tablet TAKE 1/2 TABLET BY MOUTH ONCE DAILY. 11/13/19   Antoine Poche, MD  CINNAMON PO Take 1 capsule by mouth daily.    [provider]  Coenzyme Q10 (CO Q-10) 100 MG CAPS Take 100 mg by mouth daily.     [provider]  Chilton Si Tea 150 MG CAPS Take 150 mg by mouth daily.     [provider]  lisinopril (ZESTRIL) 20 MG tablet TAKE ONE TABLET BY MOUTH ONCE DAILY. 05/14/20   Wilson Singer, MD  metoprolol succinate (TOPROL XL) 25 MG 24 hr tablet Take 1 tablet (25 mg total) by mouth daily. 11/21/19   Strader, Lennart Pall, PA-C  Multiple Vitamins-Minerals (CENTRUM SILVER 50+MEN PO) Take 1 tablet by mouth daily.    [provider]  naproxen (NAPROSYN) 500 MG tablet Take 1 tablet (500 mg total) by mouth 2 (two) times daily with a meal. 12/31/19   Leath, Janine Limbo, NP  nitroGLYCERIN (NITROSTAT) 0.4 MG SL tablet PLACE 1 TAB UNDER TONGUE EVERY 5 MIN IF NEEDED FOR CHEST PAIN. MAY USE 3 TIMES.NO RELIEF CALL 911. 09/17/17   Antoine Poche, MD  rosuvastatin (CRESTOR) 40 MG tablet Take 1 tablet (40 mg total) by mouth daily.  05/30/20   Wilson Singer, MD  sildenafil (VIAGRA) 100 MG tablet TAKE (1) TABLET BY MOUTH AS NEEDED FOR ERECTILE DYSFUNCTION. 05/14/20   Antoine Poche, MD  tadalafil (CIALIS) 10 MG tablet TAKE ONE TABLET EVERY OTHER DAY AS NEEDED FOR ERECTILE DYSFUNCTION. 08/03/20   Wilson Singer, MD  Testosterone 20 % CREA Apply 150 mg topically 2 (two) times daily. 05/08/20   Wilson Singer, MD  Turmeric 500 MG CAPS Take 500 mg by mouth daily.     [provider]  vardenafil (LEVITRA) 20 MG tablet Take 1 tablet (20 mg total) by mouth daily as needed for erectile dysfunction. 11/22/19   McKenzie, Mardene Celeste, MD  vitamin C (ASCORBIC ACID) 500 MG tablet Take 500 mg by mouth daily.    [provider]    Physical Exam: BP 108/69   Pulse (!) 103   Temp 99.9 F (37.7 C) (Oral)   Resp 20   Ht 5\' 10"  (1.778 m)   Wt 91.2 kg   SpO2 92%  BMI 28.84 kg/m   General: 75 y.o. year-old male well developed well nourished in no acute distress.  Alert and oriented x3. HEENT: Dry mouth, NCAT, EOMI Neck: Supple, trachea medial Cardiovascular: Regular rate and rhythm with no rubs or gallops.  No thyromegaly or JVD noted.  No lower extremity edema. 2/4 pulses in all 4 extremities. Respiratory: Clear to auscultation with no wheezes or rales. Good inspiratory effort. Abdomen: Soft, tender to palpation of lower abdominal quadrants without guarding.  Normal bowel sounds x4 quadrants. Muskuloskeletal: No cyanosis, clubbing or edema noted bilaterally Neuro: CN Bright-XII intact, strength 5/5 x 4, sensation, reflexes intact Skin: No ulcerative lesions noted or rashes Psychiatry: Mood is appropriate for condition and setting          Labs on Admission:  Basic Metabolic Panel: Recent Labs  Lab 08/17/20 2040  NA 135  K 3.9  CL 97*  CO2 29  GLUCOSE 159*  BUN 23  CREATININE 1.49*  CALCIUM 9.1   Liver Function Tests: Recent Labs  Lab 08/17/20 2040  AST 22  ALT 20  ALKPHOS 75  BILITOT 1.7*   PROT 8.1  ALBUMIN 4.3   Recent Labs  Lab 08/17/20 2040  LIPASE 30   No results for input(s): AMMONIA in the last 168 hours. CBC: Recent Labs  Lab 08/17/20 2040  WBC 16.1*  HGB 20.2*  HCT 59.4*  MCV 96.1  PLT 155   Cardiac Enzymes: No results for input(s): CKTOTAL, CKMB, CKMBINDEX, TROPONINI in the last 168 hours.  BNP (last 3 results) No results for input(s): BNP in the last 8760 hours.  ProBNP (last 3 results) No results for input(s): PROBNP in the last 8760 hours.  CBG: No results for input(s): GLUCAP in the last 168 hours.  Radiological Exams on Admission: CT ABDOMEN PELVIS W CONTRAST  Result Date: 08/17/2020 CLINICAL DATA:  Abdominal pain, fever Diarrhea. Pt c/o colon cramps and diarrhea that started about 3 days ago. Pt also c/o fever that started yesterday. EXAM: CT ABDOMEN AND PELVIS WITH CONTRAST TECHNIQUE: Multidetector CT imaging of the abdomen and pelvis was performed using the standard protocol following bolus administration of intravenous contrast. CONTRAST:  100mL OMNIPAQUE IOHEXOL 300 MG/ML  SOLN COMPARISON:  None. FINDINGS: Lower chest: No acute abnormality. Hepatobiliary: The liver is enlarged measuring up to 9.5 cm. No focal liver abnormality. Contracted gallbladder with bowel thickening measuring up to 4 mm. No gallstones or pericholecystic fluid. No biliary dilatation. Pancreas: Nonspecific 4 mm hypodensity within proximal pancreas (2:29). Normal pancreatic contour. No surrounding inflammatory changes. No main pancreatic ductal dilatation. Spleen: Normal in size without focal abnormality. Adrenals/Urinary Tract: No adrenal nodule bilaterally. Bilateral kidneys enhance symmetrically. No hydronephrosis. No hydroureter. The urinary bladder is unremarkable. On delayed imaging, there is no urothelial wall thickening and there are no filling defects in the opacified portions of the bilateral collecting systems or ureters. Stomach/Bowel: Stomach is within normal  limits. No evidence of bowel wall thickening or dilatation. Scattered colonic diverticulosis. Bowel wall thickening and associated pericolonic fat stranding of the distal sigmoid colon. No intramural abscess formation. Slight appendiceal caliber enlargement measuring up to 8 mm with associated periappendiceal fat stranding. Vascular/Lymphatic: No abdominal aorta or iliac aneurysm. Severe atherosclerotic plaque of the aorta and its branches. No abdominal, pelvic, or inguinal lymphadenopathy. Reproductive: Prominent prostate. Other: Trace free fluid as well as a tiny foci of free gas within the pelvis. No organized fluid collection. Musculoskeletal: No abdominal wall hernia or abnormality. No suspicious lytic or blastic osseous  lesions. No acute displaced fracture. Multilevel degenerative changes of the spine. IMPRESSION: 1. Pelvic inflammatory changes likely due to complicated distal sigmoid acute diverticulitis with associated microperforation. Associated reactive changes of the adjacent appendix. Less likely differential diagnosis includes perforated acute appendicitis with reactive changes of the sigmoid colon. No pelvic or intramural abscess formation. Recommend colonoscopy status post treatment and status post complete resolution of inflammatory changes to exclude an underlying lesion. 2. Nonspecific 4 mm hypodensity within proximal pancreas. Finding may represent focal fat intercalation or true lesion. Correlate with prior cross-sectional imaging to evaluate for stability. 3. Hepatomegaly. 4. Prominent prostate. 5.  Aortic Atherosclerosis (ICD10-I70.0) - severe. Electronically Signed   By: Tish Frederickson M.D.   On: 08/17/2020 22:30    EKG: I independently viewed the EKG done and my findings are as followed: EKG was not done in the ED  Assessment/Plan Present on Admission:  Acute diverticulitis  Dyslipidemia  Coronary artery disease involving native coronary artery of native heart without angina  pectoris  Principal Problem:   Acute diverticulitis Active Problems:   Coronary artery disease involving native coronary artery of native heart without angina pectoris   Dyslipidemia   Acute diarrhea   Leukocytosis   SIRS (systemic inflammatory response syndrome) (HCC)   Hyperglycemia   Abdominal pain   Dehydration   Essential hypertension   Acute CHF (congestive heart failure) (HCC)  Abdominal pain secondary to SIRS due to acute diverticulitis CT abdomen and pelvis suggestive of acute diverticulitis Continue IV NS at 100 mLs/Hr Continue IV Zosyn 3.375 q.8h Continue IV morphine 2 mg q.4h p.r.n. for moderate to severe pain Continue IV Zofran p.r.n. Currently n.p.o. at this time.  Start patient on clear liquid diet with plan to advance diet as tolerated if no indication for surgical intervention Obtain blood culture x2 General surgery was consulted and will follow up with patient in the morning per ED medical record  Leukocytosis possibly secondary to infection vs reactive process Continue management as [place]  Acute diarrhea Patient reported about 10 episodes of diarrhea daily for 2 days He has not had any diarrhea movement since arrival to the ED Continue to monitor and treat accordingly  Elevated Doppler brain To [place] 0.7, regular BMP check  Elevated H/H possible secondary to hemoconcentration Dehydration H/H 20.2/59.4; continue IV hydration  CAD/CHF Continue aspirin, Crestor  Essential pretension BP meds will be held at this time due to soft blood pressure  Dyslipidemia Crestor  DVT prophylaxis: SCDs  Code Status: Full code  Family Communication: None at bedside  Disposition Plan:  Patient is from:                        home Anticipated DC to:                   SNF or family members home Anticipated DC date:               2-3 days Anticipated DC barriers:          Patient requires inpatient management due to abdominal pain necessitating inpatient  management and pending general surgery  Consults called: General surgery  Admission status: Inpatient    Frankey Shown MD [facility]  08/18/2020, 3:21 AM

## 2020-08-17 NOTE — ED Provider Notes (Signed)
[facility] EMERGENCY DEPARTMENT Provider Note   CSN: [id number] Arrival date & time: 08/17/20  2014     History Chief Complaint  Patient presents with   Abdominal Pain    Austin Bright is a 75 y.o. male.  HPI  75 year old male with a history of MI, bilateral pneumonia, CAD, CHF, dyslipidemia, gout, hypertension, ischemic cardiomyopathy, shortness of breath, presents the emergency department today for evaluation of abdominal pain.  Abdominal pain started 2 days ago.  He describes the pain as diffuse and cramping.  Is been associated with diarrhea.  He states that he has had about 10 episodes per day for the last 2 days.  Denies any recent antibiotic use.  Denies hematochezia or melena.  Denies any nausea, vomiting, fevers.  Past Medical History:  Diagnosis Date   Acute MI (HCC) 08/2011   Arthritis    Bilateral pneumonia    Diagnosed after STEMI 08/2011   CAD (coronary artery disease)    a. Diagnosed 08/2011 with anterior STEMI due to thrombotic occlusion of mid LAD s/p thrombectomy, PTCA, DES placement 08/23/11.   CHF (congestive heart failure) (HCC)    Dyslipidemia    Gout    Hypertension    Ischemic cardiomyopathy    a. Initial EF 35% by cath 08/23/11, improved to 40-45% by echo 08/25/11.   Shortness of breath     Patient Active Problem List   Diagnosis Date Noted   Idiopathic gout 11/16/2018   H/O colonoscopy 03/09/2017   Chest pain with high risk of acute coronary syndrome 09/15/2011   Chronic combined systolic and diastolic congestive heart failure (HCC) 09/15/2011   Old anteroseptal myocardial infarction 08/24/2011   Coronary artery disease involving native coronary artery of native heart without angina pectoris 08/24/2011   Dyslipidemia 08/24/2011    Past Surgical History:  Procedure Laterality Date   carpel tunnel Bilateral 2002   COLONOSCOPY WITH PROPOFOL N/A 04/08/2017   Procedure: COLONOSCOPY WITH PROPOFOL;  Surgeon: Corbin Ade, MD;  Location: [facility];  Service: Endoscopy;  Laterality: N/A;  11:15am   CORONARY STENT PLACEMENT     CYSTECTOMY  1969   pilonidal cyst   LEFT AND RIGHT HEART CATHETERIZATION WITH CORONARY ANGIOGRAM N/A 09/15/2011   Procedure: LEFT AND RIGHT HEART CATHETERIZATION WITH CORONARY ANGIOGRAM;  Surgeon: Herby Abraham, MD;  Location: [facility] CATH LAB;  Service: Cardiovascular;  Laterality: N/A;   LEFT HEART CATH N/A 08/23/2011   Procedure: LEFT HEART CATH;  Surgeon: Iran Ouch, MD;  Location: [facility] CATH LAB;  Service: Cardiovascular;  Laterality: N/A;   NO PAST SURGERIES     PERCUTANEOUS CORONARY STENT INTERVENTION (PCI-S)  08/23/2011   Procedure: PERCUTANEOUS CORONARY STENT INTERVENTION (PCI-S);  Surgeon: Iran Ouch, MD;  Location: [facility] CATH LAB;  Service: Cardiovascular;;       Family History  Problem Relation Age of Onset   Hypertension Father    Stroke Mother    Colon cancer Neg Hx     Social History   Tobacco Use   Smoking status: Former    Packs/day: 1.00    Years: 25.00    Pack years: 25.00    Types: Cigarettes    Quit date: 08/23/1979    Years since quitting: 41.0   Smokeless tobacco: Never  Vaping Use   Vaping Use: Never used  Substance Use Topics   Alcohol use: Yes    Alcohol/week: 4.0 standard drinks    Types: 2 Cans of beer, 2 Shots of  liquor per week    Comment: Weekends up to 3 times a week; 2-3 drinks per sitting.   Drug use: No    Home Medications Prior to Admission medications   Medication Sig Start Date End Date Taking? Authorizing Provider  allopurinol (ZYLOPRIM) 300 MG tablet TAKE 1 TABLET BY MOUTH ONCE DAILY FOR GOUT. 07/18/20   Elenore PaddyGray, Sarah E, NP  Alpha Lipoic Acid 200 MG CAPS Take 200 mg by mouth daily.     [provider]  aspirin EC 81 MG tablet Take 81 mg by mouth daily.    [provider]  chlorthalidone (HYGROTON) 25 MG tablet TAKE 1/2 TABLET BY MOUTH ONCE DAILY. 11/13/19   Antoine PocheBranch, Jonathan F, MD  CINNAMON PO Take 1 capsule by mouth daily.     [provider]  Coenzyme Q10 (CO Q-10) 100 MG CAPS Take 100 mg by mouth daily.     [provider]  Chilton SiGreen Tea 150 MG CAPS Take 150 mg by mouth daily.     [provider]  lisinopril (ZESTRIL) 20 MG tablet TAKE ONE TABLET BY MOUTH ONCE DAILY. 05/14/20   Wilson SingerGosrani, Nimish C, MD  metoprolol succinate (TOPROL XL) 25 MG 24 hr tablet Take 1 tablet (25 mg total) by mouth daily. 11/21/19   Strader, Lennart PallBrittany M, PA-C  Multiple Vitamins-Minerals (CENTRUM SILVER 50+MEN PO) Take 1 tablet by mouth daily.    [provider]  naproxen (NAPROSYN) 500 MG tablet Take 1 tablet (500 mg total) by mouth 2 (two) times daily with a meal. 12/31/19   Leath, Janine Limbohristie Janell, NP  nitroGLYCERIN (NITROSTAT) 0.4 MG SL tablet PLACE 1 TAB UNDER TONGUE EVERY 5 MIN IF NEEDED FOR CHEST PAIN. MAY USE 3 TIMES.NO RELIEF CALL 911. 09/17/17   Antoine PocheBranch, Jonathan F, MD  rosuvastatin (CRESTOR) 40 MG tablet Take 1 tablet (40 mg total) by mouth daily. 05/30/20   Wilson SingerGosrani, Nimish C, MD  sildenafil (VIAGRA) 100 MG tablet TAKE (1) TABLET BY MOUTH AS NEEDED FOR ERECTILE DYSFUNCTION. 05/14/20   Antoine PocheBranch, Jonathan F, MD  tadalafil (CIALIS) 10 MG tablet TAKE ONE TABLET EVERY OTHER DAY AS NEEDED FOR ERECTILE DYSFUNCTION. 08/03/20   Wilson SingerGosrani, Nimish C, MD  Testosterone 20 % CREA Apply 150 mg topically 2 (two) times daily. 05/08/20   Wilson SingerGosrani, Nimish C, MD  Turmeric 500 MG CAPS Take 500 mg by mouth daily.     [provider]  vardenafil (LEVITRA) 20 MG tablet Take 1 tablet (20 mg total) by mouth daily as needed for erectile dysfunction. 11/22/19   McKenzie, Mardene CelestePatrick L, MD  vitamin C (ASCORBIC ACID) 500 MG tablet Take 500 mg by mouth daily.    [provider]    Allergies    Flonase [fluticasone propionate]  Review of Systems   Review of Systems  Constitutional:  Positive for chills. Negative for fever.  HENT:  Negative for ear pain and sore throat.   Eyes:  Negative for visual disturbance.  Respiratory:   Negative for cough and shortness of breath.   Cardiovascular:  Negative for chest pain.  Gastrointestinal:  Positive for abdominal pain and diarrhea. Negative for constipation, nausea and vomiting.  Genitourinary:  Negative for dysuria and hematuria.  Musculoskeletal:  Negative for back pain.  Skin:  Negative for rash.  Neurological:  Negative for seizures and syncope.  All other systems reviewed and are negative.  Physical Exam Updated Vital Signs BP (!) 146/91   Pulse (!) 114   Temp 100.3 F (37.9 C) (Oral)  Resp 18   Ht  (1.778 m)   Wt 91.2 kg   SpO2 96%   BMI 28.84 kg/m   Physical Exam Vitals and nursing note reviewed.  Constitutional:      Appearance: He is well-developed.  HENT:     Head: Normocephalic and atraumatic.  Eyes:     Conjunctiva/sclera: Conjunctivae normal.  Cardiovascular:     Rate and Rhythm: Regular rhythm. Tachycardia present.     Heart sounds: No murmur heard. Pulmonary:     Effort: Pulmonary effort is normal. No respiratory distress.     Breath sounds: Normal breath sounds. No wheezing, rhonchi or rales.  Abdominal:     Palpations: Abdomen is soft.     Tenderness: There is abdominal tenderness (diffuse abd TTP, worse to the bilat lower abdomen).  Musculoskeletal:     Cervical back: Neck supple.  Skin:    General: Skin is warm and dry.  Neurological:     Mental Status: He is alert.    ED Results / Procedures / Treatments   Labs (all labs ordered are listed, but only abnormal results are displayed) Labs Reviewed  COMPREHENSIVE METABOLIC PANEL - Abnormal; Notable for the following components:      Result Value   Chloride 97 (*)    Glucose, Bld 159 (*)    Creatinine, Ser 1.49 (*)    Total Bilirubin 1.7 (*)    GFR, Estimated 49 (*)    All other components within normal limits  CBC - Abnormal; Notable for the following components:   WBC 16.1 (*)    RBC 6.18 (*)    Hemoglobin 20.2 (*)    HCT 59.4 (*)    All other components  within normal limits  URINALYSIS, ROUTINE W REFLEX MICROSCOPIC - Abnormal; Notable for the following components:   Color, Urine AMBER (*)    Ketones, ur 5 (*)    Protein, ur 100 (*)    All other components within normal limits  RESP PANEL BY RT-PCR (FLU A&B, COVID) ARPGX2  CULTURE, BLOOD (ROUTINE X 2)  CULTURE, BLOOD (ROUTINE X 2)  LIPASE, BLOOD  LACTIC ACID, PLASMA  LACTIC ACID, PLASMA    EKG None  Radiology CT ABDOMEN PELVIS W CONTRAST  Result Date: 08/17/2020 CLINICAL DATA:  Abdominal pain, fever Diarrhea. Pt c/o colon cramps and diarrhea that started about 3 days ago. Pt also c/o fever that started yesterday. EXAM: CT ABDOMEN AND PELVIS WITH CONTRAST TECHNIQUE: Multidetector CT imaging of the abdomen and pelvis was performed using the standard protocol following bolus administration of intravenous contrast. CONTRAST:  OMNIPAQUE IOHEXOL 300 MG/ML  SOLN COMPARISON:  None. FINDINGS: Lower chest: No acute abnormality. Hepatobiliary: The liver is enlarged measuring up to 9.5 cm. No focal liver abnormality. Contracted gallbladder with bowel thickening measuring up to 4 mm. No gallstones or pericholecystic fluid. No biliary dilatation. Pancreas: Nonspecific 4 mm hypodensity within proximal pancreas (2:29). Normal pancreatic contour. No surrounding inflammatory changes. No main pancreatic ductal dilatation. Spleen: Normal in size without focal abnormality. Adrenals/Urinary Tract: No adrenal nodule bilaterally. Bilateral kidneys enhance symmetrically. No hydronephrosis. No hydroureter. The urinary bladder is unremarkable. On delayed imaging, there is no urothelial wall thickening and there are no filling defects in the opacified portions of the bilateral collecting systems or ureters. Stomach/Bowel: Stomach is within normal limits. No evidence of bowel wall thickening or dilatation. Scattered colonic diverticulosis. Bowel wall thickening and associated pericolonic fat stranding of the distal  sigmoid colon. No intramural abscess formation.  Slight appendiceal caliber enlargement measuring up to 8 mm with associated periappendiceal fat stranding. Vascular/Lymphatic: No abdominal aorta or iliac aneurysm. Severe atherosclerotic plaque of the aorta and its branches. No abdominal, pelvic, or inguinal lymphadenopathy. Reproductive: Prominent prostate. Other: Trace free fluid as well as a tiny foci of free gas within the pelvis. No organized fluid collection. Musculoskeletal: No abdominal wall hernia or abnormality. No suspicious lytic or blastic osseous lesions. No acute displaced fracture. Multilevel degenerative changes of the spine. IMPRESSION: 1. Pelvic inflammatory changes likely due to complicated distal sigmoid acute diverticulitis with associated microperforation. Associated reactive changes of the adjacent appendix. Less likely differential diagnosis includes perforated acute appendicitis with reactive changes of the sigmoid colon. No pelvic or intramural abscess formation. Recommend colonoscopy status post treatment and status post complete resolution of inflammatory changes to exclude an underlying lesion. 2. Nonspecific 4 mm hypodensity within proximal pancreas. Finding may represent focal fat intercalation or true lesion. Correlate with prior cross-sectional imaging to evaluate for stability. 3. Hepatomegaly. 4. Prominent prostate. 5.  Aortic Atherosclerosis (ICD10-I70.0) - severe. Electronically Signed   By: Tish Frederickson M.D.   On: 08/17/2020 22:30    Procedures Procedures   Medications Ordered in ED Medications  piperacillin-tazobactam (ZOSYN) IVPB 3.375 g (has no administration in time range)  morphine 4 MG/ML injection 4 mg (has no administration in time range)  0.9 %  sodium chloride infusion (has no administration in time range)  acetaminophen (TYLENOL) tablet 1,000 mg (1,000 mg Oral Given 08/17/20 2031)  sodium chloride 0.9 % bolus 1,000 mL (0 mLs Intravenous Stopped 08/17/20  2211)  ondansetron (ZOFRAN) injection 4 mg (4 mg Intravenous Given 08/17/20 2054)  iohexol (OMNIPAQUE) 300 MG/ML solution 100 mL (100 mLs Intravenous Contrast Given 08/17/20 2135)    ED Course  I have reviewed the triage vital signs and the nursing notes.  Pertinent labs & imaging results that were available during my care of the patient were reviewed by me and considered in my medical decision making (see chart for details).    MDM Rules/Calculators/A&P                          75 year old male presents the emergency department today for evaluation of abdominal pain and diarrhea for the last 2 days.  Reviewed/interpreted labs CBC with leukocytosis at 16,000, hemoglobin is elevated at 20 which I suspect is likely due to hemoconcentration CMP with elevated creatinine, otherwise grossly reassuring UA with ketonuria and proteinuria Lactic acid and blood cultures are pending at the time of admission COVID pending at the time of admission  CT abdomen/pelvis shows -  1. Pelvic inflammatory changes likely due to complicated distal sigmoid acute diverticulitis with associated microperforation. Associated reactive changes of the adjacent appendix. Less likely differential diagnosis includes perforated acute appendicitis with reactive changes of the sigmoid colon. No pelvic or intramural abscess formation. Recommend colonoscopy status post treatment and status post complete resolution of inflammatory changes to exclude an underlying lesion. 2. Nonspecific 4 mm hypodensity within proximal pancreas. Finding may represent focal fat intercalation or true lesion. Correlate with prior cross-sectional imaging to evaluate for stability. 3. Hepatomegaly. 4. Prominent prostate. 5.  Aortic Atherosclerosis   Patient was given IV fluids, antibiotics, pain medications.  Will admit for further treatment.  11:00 PM CONSULT with Dr. Thomes Dinning who accepts patient for admission. Requests surgical consultation.  11:11 PM  CONSULT with Dr. Henreitta Leber who recommends abx and she will see pt in the  morning.    Final Clinical Impression(s) / ED Diagnoses Final diagnoses:  Diverticulitis    Rx / DC Orders ED Discharge Orders     None        Rayne Du 08/17/20 2319    Pollyann Savoy, MD 08/18/20 [phone]

## 2020-08-17 NOTE — ED Triage Notes (Signed)
Pt c/o colon cramps and diarrhea that started about 3 days ago. Pt also c/o fever that started yesterday.

## 2020-08-17 NOTE — ED Notes (Signed)
Patient transported to CT 

## 2020-08-18 ENCOUNTER — Inpatient Hospital Stay (HOSPITAL_COMMUNITY): Payer: Medicare PPO

## 2020-08-18 DIAGNOSIS — R739 Hyperglycemia, unspecified: Secondary | ICD-10-CM

## 2020-08-18 DIAGNOSIS — N179 Acute kidney failure, unspecified: Secondary | ICD-10-CM

## 2020-08-18 DIAGNOSIS — R197 Diarrhea, unspecified: Secondary | ICD-10-CM

## 2020-08-18 DIAGNOSIS — R109 Unspecified abdominal pain: Secondary | ICD-10-CM

## 2020-08-18 DIAGNOSIS — E785 Hyperlipidemia, unspecified: Secondary | ICD-10-CM

## 2020-08-18 DIAGNOSIS — I2510 Atherosclerotic heart disease of native coronary artery without angina pectoris: Secondary | ICD-10-CM | POA: Diagnosis not present

## 2020-08-18 DIAGNOSIS — I509 Heart failure, unspecified: Secondary | ICD-10-CM

## 2020-08-18 DIAGNOSIS — I5042 Chronic combined systolic (congestive) and diastolic (congestive) heart failure: Secondary | ICD-10-CM

## 2020-08-18 DIAGNOSIS — K5792 Diverticulitis of intestine, part unspecified, without perforation or abscess without bleeding: Secondary | ICD-10-CM | POA: Diagnosis not present

## 2020-08-18 DIAGNOSIS — I10 Essential (primary) hypertension: Secondary | ICD-10-CM

## 2020-08-18 DIAGNOSIS — E860 Dehydration: Secondary | ICD-10-CM

## 2020-08-18 DIAGNOSIS — D72829 Elevated white blood cell count, unspecified: Secondary | ICD-10-CM

## 2020-08-18 DIAGNOSIS — R6510 Systemic inflammatory response syndrome (SIRS) of non-infectious origin without acute organ dysfunction: Secondary | ICD-10-CM

## 2020-08-18 DIAGNOSIS — A419 Sepsis, unspecified organism: Principal | ICD-10-CM

## 2020-08-18 LAB — CBC
HCT: 54.3 % — ABNORMAL HIGH (ref 39.0–52.0)
Hemoglobin: 18.6 g/dL — ABNORMAL HIGH (ref 13.0–17.0)
MCH: 32.9 pg (ref 26.0–34.0)
MCHC: 34.3 g/dL (ref 30.0–36.0)
MCV: 96.1 fL (ref 80.0–100.0)
Platelets: 139 10*3/uL — ABNORMAL LOW (ref 150–400)
RBC: 5.65 MIL/uL (ref 4.22–5.81)
RDW: 13.8 % (ref 11.5–15.5)
WBC: 19.4 10*3/uL — ABNORMAL HIGH (ref 4.0–10.5)
nRBC: 0 % (ref 0.0–0.2)

## 2020-08-18 LAB — PROTIME-INR
INR: 1.4 — ABNORMAL HIGH (ref 0.8–1.2)
Prothrombin Time: 16.7 seconds — ABNORMAL HIGH (ref 11.4–15.2)

## 2020-08-18 LAB — C DIFFICILE QUICK SCREEN W PCR REFLEX
C Diff antigen: NEGATIVE
C Diff interpretation: NOT DETECTED
C Diff toxin: NEGATIVE

## 2020-08-18 LAB — COMPREHENSIVE METABOLIC PANEL
ALT: 19 U/L (ref 0–44)
AST: 19 U/L (ref 15–41)
Albumin: 3.4 g/dL — ABNORMAL LOW (ref 3.5–5.0)
Alkaline Phosphatase: 58 U/L (ref 38–126)
Anion gap: 9 (ref 5–15)
BUN: 24 mg/dL — ABNORMAL HIGH (ref 8–23)
CO2: 29 mmol/L (ref 22–32)
Calcium: 8.3 mg/dL — ABNORMAL LOW (ref 8.9–10.3)
Chloride: 99 mmol/L (ref 98–111)
Creatinine, Ser: 1.53 mg/dL — ABNORMAL HIGH (ref 0.61–1.24)
GFR, Estimated: 48 mL/min — ABNORMAL LOW (ref >60)
Glucose, Bld: 122 mg/dL — ABNORMAL HIGH (ref 70–99)
Potassium: 4.3 mmol/L (ref 3.5–5.1)
Sodium: 137 mmol/L (ref 135–145)
Total Bilirubin: 2 mg/dL — ABNORMAL HIGH (ref 0.3–1.2)
Total Protein: 6.7 g/dL (ref 6.5–8.1)

## 2020-08-18 LAB — LACTIC ACID, PLASMA
Lactic Acid, Venous: 0.9 mmol/L (ref 0.5–1.9)
Lactic Acid, Venous: 1.2 mmol/L (ref 0.5–1.9)

## 2020-08-18 LAB — RESP PANEL BY RT-PCR (FLU A&B, COVID) ARPGX2
Influenza A by PCR: NEGATIVE
Influenza B by PCR: NEGATIVE
SARS Coronavirus 2 by RT PCR: NEGATIVE

## 2020-08-18 LAB — HEPATITIS C ANTIBODY: HCV Ab: NONREACTIVE

## 2020-08-18 LAB — LACTATE DEHYDROGENASE: LDH: 120 U/L (ref 98–192)

## 2020-08-18 LAB — VITAMIN B12: Vitamin B-12: 151 pg/mL — ABNORMAL LOW (ref 180–914)

## 2020-08-18 LAB — TSH: TSH: 1.659 u[IU]/mL (ref 0.350–4.500)

## 2020-08-18 LAB — APTT: aPTT: 30 seconds (ref 24–36)

## 2020-08-18 LAB — PHOSPHORUS: Phosphorus: 3.7 mg/dL (ref 2.5–4.6)

## 2020-08-18 LAB — BILIRUBIN, FRACTIONATED(TOT/DIR/INDIR)
Bilirubin, Direct: 0.5 mg/dL — ABNORMAL HIGH (ref 0.0–0.2)
Indirect Bilirubin: 1.5 mg/dL — ABNORMAL HIGH (ref 0.3–0.9)
Total Bilirubin: 2 mg/dL — ABNORMAL HIGH (ref 0.3–1.2)

## 2020-08-18 LAB — HEMOGLOBIN A1C
Hgb A1c MFr Bld: 5.5 % (ref 4.8–5.6)
Mean Plasma Glucose: 111.15 mg/dL

## 2020-08-18 LAB — MAGNESIUM: Magnesium: 1.9 mg/dL (ref 1.7–2.4)

## 2020-08-18 LAB — HEPATITIS B SURFACE ANTIGEN: Hepatitis B Surface Ag: NONREACTIVE

## 2020-08-18 LAB — FOLATE: Folate: 11 ng/mL (ref >5.9)

## 2020-08-18 LAB — BILIRUBIN, DIRECT: Bilirubin, Direct: 0.5 mg/dL — ABNORMAL HIGH (ref 0.0–0.2)

## 2020-08-18 LAB — T4, FREE: Free T4: 1.14 ng/dL — ABNORMAL HIGH (ref 0.61–1.12)

## 2020-08-18 MED ORDER — METOPROLOL SUCCINATE ER 25 MG PO TB24
25.0000 mg | ORAL_TABLET | Freq: Every day | ORAL | Status: DC
  Administered 2020-08-18 – 2020-08-19 (×2): 25 mg via ORAL
  Filled 2020-08-18 (×2): qty 1

## 2020-08-18 MED ORDER — PIPERACILLIN-TAZOBACTAM 3.375 G IVPB
3.3750 g | Freq: Three times a day (TID) | INTRAVENOUS | Status: DC
  Administered 2020-08-18 – 2020-08-19 (×4): 3.375 g via INTRAVENOUS
  Filled 2020-08-18 (×4): qty 50

## 2020-08-18 MED ORDER — SODIUM CHLORIDE 0.9 % IV SOLN: Freq: Once | INTRAVENOUS | Status: DC

## 2020-08-18 MED ORDER — ALLOPURINOL 100 MG PO TABS
150.0000 mg | ORAL_TABLET | Freq: Every day | ORAL | Status: DC
  Administered 2020-08-18 – 2020-08-19 (×2): 150 mg via ORAL
  Filled 2020-08-18 (×2): qty 2

## 2020-08-18 MED ORDER — ASPIRIN EC 81 MG PO TBEC
81.0000 mg | DELAYED_RELEASE_TABLET | Freq: Every day | ORAL | Status: DC
  Administered 2020-08-18 – 2020-08-19 (×2): 81 mg via ORAL
  Filled 2020-08-18 (×2): qty 1

## 2020-08-18 MED ORDER — MORPHINE SULFATE (PF) 2 MG/ML IV SOLN
2.0000 mg | INTRAVENOUS | Status: DC | PRN
Start: 2020-08-18 — End: 2020-08-19

## 2020-08-18 MED ORDER — ONDANSETRON HCL 4 MG/2ML IJ SOLN: 4.0000 mg | Freq: Four times a day (QID) | INTRAMUSCULAR | Status: DC | PRN

## 2020-08-18 MED ORDER — ROSUVASTATIN CALCIUM 20 MG PO TABS
40.0000 mg | ORAL_TABLET | Freq: Every day | ORAL | Status: DC
  Administered 2020-08-18 – 2020-08-19 (×2): 40 mg via ORAL
  Filled 2020-08-18 (×2): qty 2

## 2020-08-18 MED ORDER — ACETAMINOPHEN 325 MG PO TABS
650.0000 mg | ORAL_TABLET | Freq: Four times a day (QID) | ORAL | Status: DC | PRN
  Administered 2020-08-18: 650 mg via ORAL
  Filled 2020-08-18: qty 2

## 2020-08-18 MED ORDER — BOOST / RESOURCE BREEZE PO LIQD CUSTOM
1.0000 | Freq: Three times a day (TID) | ORAL | Status: DC
  Administered 2020-08-19 (×2): 1 via ORAL

## 2020-08-18 MED ORDER — MORPHINE SULFATE (PF) 2 MG/ML IV SOLN: 2.0000 mg | INTRAVENOUS | Status: DC | PRN

## 2020-08-18 MED ORDER — TRAMADOL HCL 50 MG PO TABS
50.0000 mg | ORAL_TABLET | Freq: Four times a day (QID) | ORAL | Status: DC | PRN
  Administered 2020-08-18: 50 mg via ORAL
  Filled 2020-08-18: qty 1

## 2020-08-18 NOTE — Consult Note (Signed)
[facility] Consult  Reason for Consult: Diverticulitis with microperforation  Referring Physician: Dr. Arbutus Leas   Chief Complaint   Abdominal Pain     HPI: Austin Bright is a 75 y.o. male with acute onset of abdominal pain and diarrhea for 3 days prior to coming to the ED. He has a medical history significant for HTN, HLD, CAD and CHF with ischemic cardiomyopathy in 2013. Recent stress test in 2021 without any significant findings.  He has been having 5/10 pain and diarrhea every 45 minutes per his report. The pain is crampy and in the lower abdomen. He had a fever in the ED to 102 and mild leukocytosis.   He says now he is sore but noting major. He is still having Bms. He was admitted by the hospitalist overnight for IV antibiotics. He had a colonoscopy in 2019 with Dr. Jena Gauss and had diverticulosis. He has never had an episode of diverticulitis prior.   Past Medical History:  Diagnosis Date   Acute MI (HCC) 08/2011   Arthritis    Bilateral pneumonia    Diagnosed after STEMI 08/2011   CAD (coronary artery disease)    a. Diagnosed 08/2011 with anterior STEMI due to thrombotic occlusion of mid LAD s/p thrombectomy, PTCA, DES placement 08/23/11.   CHF (congestive heart failure) (HCC)    Dyslipidemia    Gout    Hypertension    Ischemic cardiomyopathy    a. Initial EF 35% by cath 08/23/11, improved to 40-45% by echo 08/25/11.   Shortness of breath     Past Surgical History:  Procedure Laterality Date   carpel tunnel Bilateral 2002   COLONOSCOPY WITH PROPOFOL N/A 04/08/2017   Procedure: COLONOSCOPY WITH PROPOFOL;  Surgeon: Corbin Ade, MD;  Location: [facility];  Service: Endoscopy;  Laterality: N/A;  11:15am   CORONARY STENT PLACEMENT     CYSTECTOMY  1969   pilonidal cyst   LEFT AND RIGHT HEART CATHETERIZATION WITH CORONARY ANGIOGRAM N/A 09/15/2011   Procedure: LEFT AND RIGHT HEART CATHETERIZATION WITH CORONARY ANGIOGRAM;  Surgeon: Herby Abraham, MD;  Location:  [facility] CATH LAB;  Service: Cardiovascular;  Laterality: N/A;   LEFT HEART CATH N/A 08/23/2011   Procedure: LEFT HEART CATH;  Surgeon: Iran Ouch, MD;  Location: [facility] CATH LAB;  Service: Cardiovascular;  Laterality: N/A;   NO PAST SURGERIES     PERCUTANEOUS CORONARY STENT INTERVENTION (PCI-S)  08/23/2011   Procedure: PERCUTANEOUS CORONARY STENT INTERVENTION (PCI-S);  Surgeon: Iran Ouch, MD;  Location: [facility] CATH LAB;  Service: Cardiovascular;;    Family History  Problem Relation Age of Onset   Hypertension Father    Stroke Mother    Colon cancer Neg Hx     Social History   Tobacco Use   Smoking status: Former    Packs/day: 1.00    Years: 25.00    Pack years: 25.00    Types: Cigarettes    Quit date: 08/23/1979    Years since quitting: 41.0   Smokeless tobacco: Never  Vaping Use   Vaping Use: Never used  Substance Use Topics   Alcohol use: Yes    Alcohol/week: 4.0 standard drinks    Types: 2 Cans of beer, 2 Shots of liquor per week    Comment: Weekends up to 3 times a week; 2-3 drinks per sitting.   Drug use: No    Medications: I have reviewed the patient's current medications. Prior to Admission: (Not in a hospital admission)  Scheduled:  morphine  4 mg Intravenous Once   Continuous:  sodium chloride     piperacillin-tazobactam 3.375 g (08/18/20 0551)   SNK:[id number], morphine injection, ondansetron (ZOFRAN) IV, traMADol  Allergies  Allergen Reactions   Flonase [Fluticasone Propionate] Other (See Comments)    Nose bleeds.     ROS:  A comprehensive review of systems was negative except for: Gastrointestinal: positive for abdominal pain and diarrhea  Blood pressure 131/88, pulse 98, temperature 99.5 F (37.5 C), temperature source Oral, resp. rate 20, height 5\' 10"  (1.778 m), weight 91.2 kg, SpO2 95 %. Physical Exam  Results: Results for orders placed or performed during the hospital encounter of 08/17/20 (from the past 48 hour(s))  Lipase, blood      Status: None   Collection Time: 08/17/20  8:40 PM  Result Value Ref Range   Lipase 30 11 - 51 U/L    Comment: Performed at [facility], [address] [place], [place]  Comprehensive metabolic panel     Status: Abnormal   Collection Time: 08/17/20  8:40 PM  Result Value Ref Range   Sodium 135 135 - 145 mmol/L   Potassium 3.9 3.5 - 5.1 mmol/L   Chloride 97 (L) 98 - 111 mmol/L   CO2 29 22 - 32 mmol/L   Glucose, Bld 159 (H) 70 - 99 mg/dL    Comment: Glucose reference range applies only to samples taken after fasting for at least 8 hours.   BUN 23 8 - 23 mg/dL   Creatinine, Ser 08/19/20 (H) 0.61 - 1.24 mg/dL   Calcium 9.1 8.9 - 7.35 mg/dL   Total Protein 8.1 6.5 - 8.1 g/dL   Albumin 4.3 3.5 - 5.0 g/dL   AST 22 15 - 41 U/L   ALT 20 0 - 44 U/L   Alkaline Phosphatase 75 38 - 126 U/L   Total Bilirubin 1.7 (H) 0.3 - 1.2 mg/dL   GFR, Estimated 49 (L) >60 mL/min    Comment: (NOTE) Calculated using the CKD-EPI Creatinine Equation (2021)    Anion gap 9 5 - 15    Comment: Performed at [facility], [address] [place], [place]  CBC     Status: Abnormal   Collection Time: 08/17/20  8:40 PM  Result Value Ref Range   WBC 16.1 (H) 4.0 - 10.5 K/uL   RBC 6.18 (H) 4.22 - 5.81 MIL/uL   Hemoglobin 20.2 (H) 13.0 - 17.0 g/dL   HCT 08/19/20 (H) 83.4 - 19.6 %   MCV 96.1 80.0 - 100.0 fL   MCH 32.7 26.0 - 34.0 pg   MCHC 34.0 30.0 - 36.0 g/dL   RDW 22.2 97.9 - 89.2 %   Platelets 155 150 - 400 K/uL   nRBC 0.0 0.0 - 0.2 %    Comment: Performed at [facility], [address], [place], [place]  Urinalysis, Routine w reflex microscopic     Status: Abnormal   Collection Time: 08/17/20 10:08 PM  Result Value Ref Range   Color, Urine AMBER (A) YELLOW    Comment: BIOCHEMICALS MAY BE AFFECTED BY COLOR   APPearance CLEAR CLEAR   Specific Gravity, Urine 1.024 1.005 - 1.030   pH 6.0 5.0 - 8.0   Glucose, UA NEGATIVE NEGATIVE mg/dL   Hgb urine dipstick NEGATIVE NEGATIVE   Bilirubin  Urine NEGATIVE NEGATIVE   Ketones, ur 5 (A) NEGATIVE mg/dL   Protein, ur 08/19/20 (A) NEGATIVE mg/dL   Nitrite NEGATIVE NEGATIVE  Leukocytes,Ua NEGATIVE NEGATIVE   RBC / HPF 0-5 0 - 5 RBC/hpf   WBC, UA 0-5 0 - 5 WBC/hpf   Bacteria, UA NONE SEEN NONE SEEN    Comment: Performed at [facility], [address], [place], [place] [address]  Resp Panel by RT-PCR (Flu A&B, Covid) Nasopharyngeal Swab     Status: None   Collection Time: 08/17/20 10:11 PM   Specimen: Nasopharyngeal Swab; Nasopharyngeal(NP) swabs in vial transport medium  Result Value Ref Range   SARS Coronavirus 2 by RT PCR NEGATIVE NEGATIVE    Comment: (NOTE) SARS-CoV-2 target nucleic acids are NOT DETECTED.  The SARS-CoV-2 RNA is generally detectable in upper respiratory specimens during the acute phase of infection. The lowest concentration of SARS-CoV-2 viral copies this assay can detect is 138 copies/mL. A negative result does not preclude SARS-Cov-2 infection and should not be used as the sole basis for treatment or other patient management decisions. A negative result may occur with  improper specimen collection/handling, submission of specimen other than nasopharyngeal swab, presence of viral mutation(s) within the areas targeted by this assay, and inadequate number of viral copies(<138 copies/mL). A negative result must be combined with clinical observations, patient history, and epidemiological information. The expected result is Negative.  Fact Sheet for Patients:  BloggerCourse.com  Fact Sheet for Healthcare Providers:  SeriousBroker.it  This test is no t yet approved or cleared by the [place] FDA and  has been authorized for detection and/or diagnosis of SARS-CoV-2 by FDA under an Emergency Use Authorization (EUA). This EUA will remain  in effect (meaning this test can be used) for the duration of the COVID-19 declaration under Section 564(b)(1) of the Act,  21 U.S.C.section 360bbb-3(b)(1), unless the authorization is terminated  or revoked sooner.       Influenza A by PCR NEGATIVE NEGATIVE   Influenza B by PCR NEGATIVE NEGATIVE    Comment: (NOTE) The Xpert Xpress SARS-CoV-2/FLU/RSV plus assay is intended as an aid in the diagnosis of influenza from Nasopharyngeal swab specimens and should not be used as a sole basis for treatment. Nasal washings and aspirates are unacceptable for Xpert Xpress SARS-CoV-2/FLU/RSV testing.  Fact Sheet for Patients: BloggerCourse.com  Fact Sheet for Healthcare Providers: SeriousBroker.it  This test is not yet approved or cleared by the [place] FDA and has been authorized for detection and/or diagnosis of SARS-CoV-2 by FDA under an Emergency Use Authorization (EUA). This EUA will remain in effect (meaning this test can be used) for the duration of the COVID-19 declaration under Section 564(b)(1) of the Act, 21 U.S.C. section 360bbb-3(b)(1), unless the authorization is terminated or revoked.  Performed at [facility], [address], [place], [place] [address]   Lactic acid, plasma     Status: None   Collection Time: 08/17/20 11:45 PM  Result Value Ref Range   Lactic Acid, Venous 1.2 0.5 - 1.9 mmol/L    Comment: Performed at [facility], [address], [place], [place] [address]  Blood culture (routine x 2)     Status: None (Preliminary result)   Collection Time: 08/17/20 11:45 PM   Specimen: Left Antecubital; Blood  Result Value Ref Range   Specimen Description LEFT ANTECUBITAL    Special Requests      BOTTLES DRAWN AEROBIC AND ANAEROBIC Blood Culture adequate volume   Culture      NO GROWTH < 12 HOURS Performed at [facility], [address], [place], [place] [address]    Report Status PENDING  Blood culture (routine x 2)     Status: None (Preliminary result)   Collection Time: 08/17/20 11:55 PM   Specimen: BLOOD LEFT ARM  Result  Value Ref Range   Specimen Description BLOOD LEFT ARM    Special Requests      BOTTLES DRAWN AEROBIC AND ANAEROBIC Blood Culture adequate volume   Culture      NO GROWTH < 12 HOURS Performed at [facility], [address], [place], [place] [address]    Report Status PENDING   TSH     Status: None   Collection Time: 08/17/20 11:55 PM  Result Value Ref Range   TSH 1.659 0.350 - 4.500 uIU/mL    Comment: Performed by a 3rd Generation assay with a functional sensitivity of <=0.01 uIU/mL. Performed at [facility], [address], [place], [place] [address]   Lactic acid, plasma     Status: None   Collection Time: 08/18/20  3:14 AM  Result Value Ref Range   Lactic Acid, Venous 0.9 0.5 - 1.9 mmol/L    Comment: Performed at [facility], [address], [place], [place] [address]  Bilirubin, direct     Status: Abnormal   Collection Time: 08/18/20  3:14 AM  Result Value Ref Range   Bilirubin, Direct 0.5 (H) 0.0 - 0.2 mg/dL    Comment: Performed at [facility], [address], [place], [place] [address]  CBC     Status: Abnormal   Collection Time: 08/18/20  3:14 AM  Result Value Ref Range   WBC 19.4 (H) 4.0 - 10.5 K/uL   RBC 5.65 4.22 - 5.81 MIL/uL   Hemoglobin 18.6 (H) 13.0 - 17.0 g/dL   HCT 29.5 (H) 62.1 - 30.8 %   MCV 96.1 80.0 - 100.0 fL   MCH 32.9 26.0 - 34.0 pg   MCHC 34.3 30.0 - 36.0 g/dL   RDW 65.7 84.6 - 96.2 %   Platelets 139 (L) 150 - 400 K/uL   nRBC 0.0 0.0 - 0.2 %    Comment: Performed at [facility], [address] [place], [place] [address]  Protime-INR     Status: Abnormal   Collection Time: 08/18/20  3:14 AM  Result Value Ref Range   Prothrombin Time 16.7 (H) 11.4 - 15.2 seconds   INR 1.4 (H) 0.8 - 1.2    Comment: (NOTE) INR goal varies based on device and disease states. Performed at [facility], [address] [place], [place] [address]   APTT     Status: None   Collection Time: 08/18/20  3:14 AM  Result Value Ref Range   aPTT 30 24 - 36 seconds     Comment: Performed at [facility], [address] [place], [place] [address]  Comprehensive metabolic panel     Status: Abnormal   Collection Time: 08/18/20  6:22 AM  Result Value Ref Range   Sodium 137 135 - 145 mmol/L   Potassium 4.3 3.5 - 5.1 mmol/L   Chloride 99 98 - 111 mmol/L   CO2 29 22 - 32 mmol/L   Glucose, Bld 122 (H) 70 - 99 mg/dL    Comment: Glucose reference range applies only to samples taken after fasting for at least 8 hours.   BUN 24 (H) 8 - 23 mg/dL   Creatinine, Ser 2.53 (H) 0.61 - 1.24 mg/dL   Calcium 8.3 (L) 8.9 - 10.3 mg/dL   Total Protein 6.7 6.5 - 8.1 g/dL   Albumin 3.4 (L) 3.5 - 5.0  g/dL   AST 19 15 - 41 U/L   ALT 19 0 - 44 U/L   Alkaline Phosphatase 58 38 - 126 U/L   Total Bilirubin 2.0 (H) 0.3 - 1.2 mg/dL   GFR, Estimated 48 (L) >60 mL/min    Comment: (NOTE) Calculated using the CKD-EPI Creatinine Equation (2021)    Anion gap 9 5 - 15    Comment: Performed at [facility], [address] [place], [place] [address]  Magnesium     Status: None   Collection Time: 08/18/20  6:22 AM  Result Value Ref Range   Magnesium 1.9 1.7 - 2.4 mg/dL    Comment: Performed at [facility], [address] [place], [place] [address]  Phosphorus     Status: None   Collection Time: 08/18/20  6:22 AM  Result Value Ref Range   Phosphorus 3.7 2.5 - 4.6 mg/dL    Comment: Performed at [facility], [address] [place], [place] [address]  Vitamin B12     Status: Abnormal   Collection Time: 08/18/20  8:25 AM  Result Value Ref Range   Vitamin B-12 151 (L) 180 - 914 pg/mL    Comment: (NOTE) This assay is not validated for testing neonatal or myeloproliferative syndrome specimens for Vitamin B12 levels. Performed at [facility], [address], [place], [place] [address]   Folate     Status: None   Collection Time: 08/18/20  8:25 AM  Result Value Ref Range   Folate 11.0 >5.9 ng/mL    Comment: Performed at [facility], [address], [place], [place] [address]  Lactate  dehydrogenase     Status: None   Collection Time: 08/18/20  8:40 AM  Result Value Ref Range   LDH 120 98 - 192 U/L    Comment: Performed at [facility], [address] [place], [place] [address]  Bilirubin, fractionated(tot/dir/indir)     Status: Abnormal   Collection Time: 08/18/20  8:40 AM  Result Value Ref Range   Total Bilirubin 2.0 (H) 0.3 - 1.2 mg/dL   Bilirubin, Direct 0.5 (H) 0.0 - 0.2 mg/dL   Indirect Bilirubin 1.5 (H) 0.3 - 0.9 mg/dL    Comment: Performed at [facility], [address] [place], [place] [address]   Personally reviewed CT- thickened colon with microperforations around the sigmoid, appendix overlying the area and with associated inflammation, no fluid collections or abscess  US Abdomen Complete  Result Date: 08/18/2020 CLINICAL DATA:  Acute kidney injury, hepatomegaly, thrombocytopenia, hyperbilirubinemia EXAM: ABDOMEN ULTRASOUND COMPLETE COMPARISON:  CT 08/17/2020 FINDINGS: Gallbladder: No gallstones or wall thickening visualized. No sonographic Murphy sign noted by sonographer. Common bile duct: Diameter: Normal caliber, 6 mm Liver: No focal lesion identified. Within normal limits in parenchymal echogenicity. Portal vein is patent on color Doppler imaging with normal direction of blood flow towards the liver. IVC: No abnormality visualized. Pancreas: Visualized portion unremarkable. Spleen: Size and appearance within normal limits. Right Kidney: Length: 11.3 cm. Echogenicity within normal limits. No mass or hydronephrosis visualized. Left Kidney: Length: 11.3 cm. Echogenicity within normal limits. No mass or hydronephrosis visualized. Abdominal aorta: No aneurysm visualized. Other findings: None. IMPRESSION: No acute findings. Electronically Signed   By: Charlett Nose M.D.   On: 08/18/2020 12:49   CT ABDOMEN PELVIS W CONTRAST  Result Date: 08/17/2020 CLINICAL DATA:  Abdominal pain, fever Diarrhea. Pt c/o colon cramps and diarrhea that started about 3 days ago. Pt also  c/o fever that started yesterday. EXAM: CT ABDOMEN AND  PELVIS WITH CONTRAST TECHNIQUE: Multidetector CT imaging of the abdomen and pelvis was performed using the standard protocol following bolus administration of intravenous contrast. CONTRAST:  100mL OMNIPAQUE IOHEXOL 300 MG/ML  SOLN COMPARISON:  None. FINDINGS: Lower chest: No acute abnormality. Hepatobiliary: The liver is enlarged measuring up to 9.5 cm. No focal liver abnormality. Contracted gallbladder with bowel thickening measuring up to 4 mm. No gallstones or pericholecystic fluid. No biliary dilatation. Pancreas: Nonspecific 4 mm hypodensity within proximal pancreas (2:29). Normal pancreatic contour. No surrounding inflammatory changes. No main pancreatic ductal dilatation. Spleen: Normal in size without focal abnormality. Adrenals/Urinary Tract: No adrenal nodule bilaterally. Bilateral kidneys enhance symmetrically. No hydronephrosis. No hydroureter. The urinary bladder is unremarkable. On delayed imaging, there is no urothelial wall thickening and there are no filling defects in the opacified portions of the bilateral collecting systems or ureters. Stomach/Bowel: Stomach is within normal limits. No evidence of bowel wall thickening or dilatation. Scattered colonic diverticulosis. Bowel wall thickening and associated pericolonic fat stranding of the distal sigmoid colon. No intramural abscess formation. Slight appendiceal caliber enlargement measuring up to 8 mm with associated periappendiceal fat stranding. Vascular/Lymphatic: No abdominal aorta or iliac aneurysm. Severe atherosclerotic plaque of the aorta and its branches. No abdominal, pelvic, or inguinal lymphadenopathy. Reproductive: Prominent prostate. Other: Trace free fluid as well as a tiny foci of free gas within the pelvis. No organized fluid collection. Musculoskeletal: No abdominal wall hernia or abnormality. No suspicious lytic or blastic osseous lesions. No acute displaced fracture.  Multilevel degenerative changes of the spine. IMPRESSION: 1. Pelvic inflammatory changes likely due to complicated distal sigmoid acute diverticulitis with associated microperforation. Associated reactive changes of the adjacent appendix. Less likely differential diagnosis includes perforated acute appendicitis with reactive changes of the sigmoid colon. No pelvic or intramural abscess formation. Recommend colonoscopy status post treatment and status post complete resolution of inflammatory changes to exclude an underlying lesion. 2. Nonspecific 4 mm hypodensity within proximal pancreas. Finding may represent focal fat intercalation or true lesion. Correlate with prior cross-sectional imaging to evaluate for stability. 3. Hepatomegaly. 4. Prominent prostate. 5.  Aortic Atherosclerosis (ICD10-I70.0) - severe. Electronically Signed   By: Tish FredericksonMorgane  Naveau M.D.   On: 08/17/2020 22:30     Assessment & Plan:  Austin Bright is a 75 y.o. male with diverticulitis of the sigmoid colon and microperforation. Doing fair now.   We have discussed that diverticulitis is a spectrum of disease. We discussed that it ranges from simple diverticulitis that can be treated with oral antibiotics as an outpatient to severe cases with perforations that require emergency surgery and colostomy.  We discussed that there are cases that are intermediate or complicated and require hospitalization for IV antibiotics and sometimes require Interventional Radiology drainage of abscesses or fluid collections that form.  We have discussed that some people still require emergency surgery if their case worsens and that during the acute inflammation and infection period a colostomy. We discussed that most people are able to be discharged with antibiotics +/- a drain, and we discuss the options of elective colectomy as an outpatient. We also discussed the potential need for a colonoscopy if there is not a recent study performed.    We  discussed  the rare chance that this si appendicitis with perforation and inflamed sigmoid associated with this but that this is less likely. Discussed repeat imaging as an outpatient once things have cleared. Discussed that perforated appendicitis would be treated with IV antibiotics too.  Would do  24-48 hr of IV antibiotics, can likely dc home Monday afternoon or Tuesday on oral Augment for 10 day course I can see him in my office in August to follow up 8/30.   Clear diet and adv as tolerated. Dr. Lovell Sheehan will see him tomorrow    All questions were answered to the satisfaction of the patient and family.    Lucretia Roers 08/18/2020, 1:01 PM

## 2020-08-18 NOTE — Progress Notes (Signed)
Patient states that he has not had any of his at home medications including crestor, metoprolol, and aspirin. MD Tat made aware.

## 2020-08-18 NOTE — Progress Notes (Addendum)
PROGRESS NOTE  Austin Bright WUX:[id number] DOB: Oct 05, 1945 DOA: 08/17/2020 PCP: Doree Albee, MD  Brief History:  75 year old male with a history of hypertension, hyperlipidemia, coronary artery disease, systolic and diastolic CHF presenting with 3-day history of lower abdominal pain and diarrhea.  The patient states that he was having up to 10 bowel movements on a daily basis for the past 3 days without any hematochezia or melena.  He has also had fevers and chills up to 102.0 F on the day of admission.  He denied any headache, chest pain, shortness of breath, coughing, hemoptysis, nausea, vomiting, hematemesis.  He had some dysuria but denies any hematuria.  He denies any recent travels or any recent antibiotics. In the emergency department, the patient was febrile up to 102.0 F and tachycardic into the 110s.  He was hemodynamically stable with oxygen saturation 93-95% on room air.  BMP showed a sodium 135, potassium 3.9, serum creatinine 1.49.  AST 22, ALT 20, alk phosphatase 75, total bilirubin 1.7.  WBC 16.1, hemoglobin 20.2, platelets 139,000.  CT of the abdomen and pelvis showed hepatomegaly.  There was a proximal pancreatic hypodensity measuring 4 mm.  Distal sigmoid colon showed fat stranding with thickening and microperforation.  There was slight appendiceal enlargement with periappendiceal fat stranding, but this was felt to be secondary to the patient's colonic process.  The patient was started on IV Zosyn and fluids.  Assessment/Plan: Sepsis -Present on admission -Patient presented with fever, tachycardia, and leukocytosis -Secondary to acute diverticulitis -Lactic acid peaked 1.2 -Continue IV fluids -Continue Zosyn  Acute diverticulitis with microperforation -Continue IV Zosyn -Clear liquid diet -General surgery consult -this is his first episode  Chronic systolic and diastolic CHF -Clinically hypovolemic -Resume metoprolol succinate -09/15/2011 echo EF  30-35% -12/04/2011 echo EF 50-55%, grade 2 DD  Essential hypertension -Holding chlorthalidone -Restart metoprolol succinate -Holding lisinopril  Coronary artery disease -No chest pain presently -Continue metoprolol succinate and Crestor  Hyperglycemia -Check hemoglobin A1c  Diarrhea -Likely secondary to acute diverticulitis -Stool pathogen panel -check Cdiff  Thrombocytopenia -This appears to be chronic dating back to August 2013 -Monitor for signs of bleeding -B12 -folate -TSH  Hyperbilirubinemia -mostly indirect bili -check Haptoglobin -Abd [place]  AKI -due to sepsis and volume depletion -continue IVF -baseline creatinine 0.9-1.2 -serum creatinine peaked 1.53   Status is: Inpatient  Remains inpatient appropriate because:IV treatments appropriate due to intensity of illness or inability to take PO  Dispo: The patient is from: Home              Anticipated d/c is to: Home              Patient currently is not medically stable to d/c.   Difficult to place patient No        Family Communication:   no Family at bedside  Consultants:  general surgery  Code Status:  FULL   DVT Prophylaxis:  [place] Heparin / [place] Lovenox   Procedures: As Listed in Progress Note Above  Antibiotics: Zosyn 7/30>>      Subjective: Patient states that his lower abdominal pain is about the same as yesterday.  He has not had any more vomiting.  Continues to have loose stools.  He denies any chest pain, shortness breath, cough, hemoptysis, nausea, vomiting.  Objective: Vitals:   08/18/20 0300 08/18/20 0400 08/18/20 0530 08/18/20 0600  BP: 112/81  126/81 123/72  Pulse: (!) 104 99 97  99  Resp: $Remo'12 11 12 19  'PmaGE$ Temp:   99.5 F (37.5 C)   TempSrc:   Oral   SpO2: 93% 93% 95% 93%  Weight:      Height:        Intake/Output Summary (Last 24 hours) at 08/18/2020 0730 Last data filed at 08/18/2020 9833 Gross per 24 hour  Intake 1290 ml  Output --  Net 1290 ml   Weight change:   Exam:  General:  Pt is alert, follows commands appropriately, not in acute distress HEENT: No icterus, No thrush, No neck mass, [place]/AT Cardiovascular: RRR, S1/S2, no rubs, no gallops Respiratory: CTA bilaterally, no wheezing, no crackles, no rhonchi Abdomen: Soft/+BS, RLQ, LLQ tender, non distended, no guarding Extremities: No edema, No lymphangitis, No petechiae, No rashes, no synovitis   Data Reviewed: I have personally reviewed following labs and imaging studies Basic Metabolic Panel: Recent Labs  Lab 08/17/20 2040  NA 135  K 3.9  CL 97*  CO2 29  GLUCOSE 159*  BUN 23  CREATININE 1.49*  CALCIUM 9.1   Liver Function Tests: Recent Labs  Lab 08/17/20 2040  AST 22  ALT 20  ALKPHOS 75  BILITOT 1.7*  PROT 8.1  ALBUMIN 4.3   Recent Labs  Lab 08/17/20 2040  LIPASE 30   No results for input(s): AMMONIA in the last 168 hours. Coagulation Profile: Recent Labs  Lab 08/18/20 0314  INR 1.4*   CBC: Recent Labs  Lab 08/17/20 2040 08/18/20 0314  WBC 16.1* 19.4*  HGB 20.2* 18.6*  HCT 59.4* 54.3*  MCV 96.1 96.1  PLT 155 139*   Cardiac Enzymes: No results for input(s): CKTOTAL, CKMB, CKMBINDEX, TROPONINI in the last 168 hours. BNP: Invalid input(s): POCBNP CBG: No results for input(s): GLUCAP in the last 168 hours. HbA1C: No results for input(s): HGBA1C in the last 72 hours. Urine analysis:    Component Value Date/Time   COLORURINE AMBER (A) 08/17/2020 2208   APPEARANCEUR CLEAR 08/17/2020 2208   APPEARANCEUR Clear 01/31/2020 1118   LABSPEC 1.024 08/17/2020 2208   PHURINE 6.0 08/17/2020 2208   GLUCOSEU NEGATIVE 08/17/2020 2208   HGBUR NEGATIVE 08/17/2020 2208   BILIRUBINUR NEGATIVE 08/17/2020 2208   BILIRUBINUR Negative 01/31/2020 1118   KETONESUR 5 (A) 08/17/2020 2208   PROTEINUR 100 (A) 08/17/2020 2208   NITRITE NEGATIVE 08/17/2020 2208   LEUKOCYTESUR NEGATIVE 08/17/2020 2208   Sepsis Labs: $RemoveBefo'@[email or web address]'RwbOyszLNlP$ (procalcitonin:4,lacticidven:4) ) Recent  Results (from the past 240 hour(s))  Resp Panel by RT-PCR (Flu A&B, Covid) Nasopharyngeal Swab     Status: None   Collection Time: 08/17/20 10:11 PM   Specimen: Nasopharyngeal Swab; Nasopharyngeal(NP) swabs in vial transport medium  Result Value Ref Range Status   SARS Coronavirus 2 by RT PCR NEGATIVE NEGATIVE Final    Comment: (NOTE) SARS-CoV-2 target nucleic acids are NOT DETECTED.  The SARS-CoV-2 RNA is generally detectable in upper respiratory specimens during the acute phase of infection. The lowest concentration of SARS-CoV-2 viral copies this assay can detect is 138 copies/mL. A negative result does not preclude SARS-Cov-2 infection and should not be used as the sole basis for treatment or other patient management decisions. A negative result may occur with  improper specimen collection/handling, submission of specimen other than nasopharyngeal swab, presence of viral mutation(s) within the areas targeted by this assay, and inadequate number of viral copies(<138 copies/mL). A negative result must be combined with clinical observations, patient history, and epidemiological information. The expected result is Negative.  Fact Sheet for Patients:  EntrepreneurPulse.com.au  Fact Sheet for Healthcare Providers:  IncredibleEmployment.be  This test is no t yet approved or cleared by the [place] FDA and  has been authorized for detection and/or diagnosis of SARS-CoV-2 by FDA under an Emergency Use Authorization (EUA). This EUA will remain  in effect (meaning this test can be used) for the duration of the COVID-19 declaration under Section 564(b)(1) of the Act, 21 U.S.C.section 360bbb-3(b)(1), unless the authorization is terminated  or revoked sooner.       Influenza A by PCR NEGATIVE NEGATIVE Final   Influenza B by PCR NEGATIVE NEGATIVE Final    Comment: (NOTE) The Xpert Xpress SARS-CoV-2/FLU/RSV plus assay is intended as an aid in the  diagnosis of influenza from Nasopharyngeal swab specimens and should not be used as a sole basis for treatment. Nasal washings and aspirates are unacceptable for Xpert Xpress SARS-CoV-2/FLU/RSV testing.  Fact Sheet for Patients: EntrepreneurPulse.com.au  Fact Sheet for Healthcare Providers: IncredibleEmployment.be  This test is not yet approved or cleared by the [place] FDA and has been authorized for detection and/or diagnosis of SARS-CoV-2 by FDA under an Emergency Use Authorization (EUA). This EUA will remain in effect (meaning this test can be used) for the duration of the COVID-19 declaration under Section 564(b)(1) of the Act, 21 U.S.C. section 360bbb-3(b)(1), unless the authorization is terminated or revoked.  Performed at [facility], [address] [place], [place] [address]   Blood culture (routine x 2)     Status: None (Preliminary result)   Collection Time: 08/17/20 11:45 PM   Specimen: Left Antecubital; Blood  Result Value Ref Range Status   Specimen Description LEFT ANTECUBITAL  Final   Special Requests   Final    BOTTLES DRAWN AEROBIC AND ANAEROBIC Blood Culture adequate volume Performed at [facility], [address] [place], [place] [address]    Culture PENDING  Incomplete   Report Status PENDING  Incomplete  Blood culture (routine x 2)     Status: None (Preliminary result)   Collection Time: 08/17/20 11:55 PM   Specimen: BLOOD LEFT ARM  Result Value Ref Range Status   Specimen Description BLOOD LEFT ARM  Final   Special Requests   Final    BOTTLES DRAWN AEROBIC AND ANAEROBIC Blood Culture adequate volume Performed at [facility], [address], [place], [place] [address]    Culture PENDING  Incomplete   Report Status PENDING  Incomplete     Scheduled Meds:  morphine  4 mg Intravenous Once   Continuous Infusions:  sodium chloride     piperacillin-tazobactam 3.375 g (08/18/20 0551)     Procedures/Studies: CT ABDOMEN PELVIS W CONTRAST  Result Date: 08/17/2020 CLINICAL DATA:  Abdominal pain, fever Diarrhea. Pt c/o colon cramps and diarrhea that started about 3 days ago. Pt also c/o fever that started yesterday. EXAM: CT ABDOMEN AND PELVIS WITH CONTRAST TECHNIQUE: Multidetector CT imaging of the abdomen and pelvis was performed using the standard protocol following bolus administration of intravenous contrast. CONTRAST:  168mL OMNIPAQUE IOHEXOL 300 MG/ML  SOLN COMPARISON:  None. FINDINGS: Lower chest: No acute abnormality. Hepatobiliary: The liver is enlarged measuring up to 9.5 cm. No focal liver abnormality. Contracted gallbladder with bowel thickening measuring up to 4 mm. No gallstones or pericholecystic fluid. No biliary dilatation. Pancreas: Nonspecific 4 mm hypodensity within proximal pancreas (2:29). Normal pancreatic contour. No surrounding inflammatory changes. No main pancreatic ductal dilatation. Spleen: Normal in size without focal abnormality. Adrenals/Urinary Tract: No adrenal nodule bilaterally. Bilateral kidneys enhance  symmetrically. No hydronephrosis. No hydroureter. The urinary bladder is unremarkable. On delayed imaging, there is no urothelial wall thickening and there are no filling defects in the opacified portions of the bilateral collecting systems or ureters. Stomach/Bowel: Stomach is within normal limits. No evidence of bowel wall thickening or dilatation. Scattered colonic diverticulosis. Bowel wall thickening and associated pericolonic fat stranding of the distal sigmoid colon. No intramural abscess formation. Slight appendiceal caliber enlargement measuring up to 8 mm with associated periappendiceal fat stranding. Vascular/Lymphatic: No abdominal aorta or iliac aneurysm. Severe atherosclerotic plaque of the aorta and its branches. No abdominal, pelvic, or inguinal lymphadenopathy. Reproductive: Prominent prostate. Other: Trace free fluid as well as a tiny foci  of free gas within the pelvis. No organized fluid collection. Musculoskeletal: No abdominal wall hernia or abnormality. No suspicious lytic or blastic osseous lesions. No acute displaced fracture. Multilevel degenerative changes of the spine. IMPRESSION: 1. Pelvic inflammatory changes likely due to complicated distal sigmoid acute diverticulitis with associated microperforation. Associated reactive changes of the adjacent appendix. Less likely differential diagnosis includes perforated acute appendicitis with reactive changes of the sigmoid colon. No pelvic or intramural abscess formation. Recommend colonoscopy status post treatment and status post complete resolution of inflammatory changes to exclude an underlying lesion. 2. Nonspecific 4 mm hypodensity within proximal pancreas. Finding may represent focal fat intercalation or true lesion. Correlate with prior cross-sectional imaging to evaluate for stability. 3. Hepatomegaly. 4. Prominent prostate. 5.  Aortic Atherosclerosis (ICD10-I70.0) - severe. Electronically Signed   By: Iven Finn M.D.   On: 08/17/2020 22:30    Orson Eva, DO  [facility]  If 7PM-7AM, please contact night-coverage [email or web address] Password [email or web address] 08/18/2020, 7:30 AM   LOS: 1 day

## 2020-08-18 NOTE — ED Notes (Signed)
Dr. Arbutus Leas in with pt at this time

## 2020-08-18 NOTE — ED Notes (Signed)
Report given to Tori RN

## 2020-08-19 DIAGNOSIS — R160 Hepatomegaly, not elsewhere classified: Secondary | ICD-10-CM

## 2020-08-19 DIAGNOSIS — N179 Acute kidney failure, unspecified: Secondary | ICD-10-CM | POA: Diagnosis not present

## 2020-08-19 DIAGNOSIS — K5792 Diverticulitis of intestine, part unspecified, without perforation or abscess without bleeding: Secondary | ICD-10-CM | POA: Diagnosis not present

## 2020-08-19 DIAGNOSIS — I2510 Atherosclerotic heart disease of native coronary artery without angina pectoris: Secondary | ICD-10-CM | POA: Diagnosis not present

## 2020-08-19 DIAGNOSIS — I10 Essential (primary) hypertension: Secondary | ICD-10-CM | POA: Diagnosis not present

## 2020-08-19 LAB — GASTROINTESTINAL PANEL BY PCR, STOOL (REPLACES STOOL CULTURE)

## 2020-08-19 LAB — CBC
HCT: 53.5 % — ABNORMAL HIGH (ref 39.0–52.0)
Hemoglobin: 18 g/dL — ABNORMAL HIGH (ref 13.0–17.0)
MCH: 32.9 pg (ref 26.0–34.0)
MCHC: 33.6 g/dL (ref 30.0–36.0)
MCV: 97.8 fL (ref 80.0–100.0)
Platelets: 141 10*3/uL — ABNORMAL LOW (ref 150–400)
RBC: 5.47 MIL/uL (ref 4.22–5.81)
RDW: 13.9 % (ref 11.5–15.5)
WBC: 14.7 10*3/uL — ABNORMAL HIGH (ref 4.0–10.5)
nRBC: 0 % (ref 0.0–0.2)

## 2020-08-19 LAB — COMPREHENSIVE METABOLIC PANEL WITH GFR
ALT: 21 U/L (ref 0–44)
AST: 26 U/L (ref 15–41)
Albumin: 3.1 g/dL — ABNORMAL LOW (ref 3.5–5.0)
Alkaline Phosphatase: 70 U/L (ref 38–126)
Anion gap: 11 (ref 5–15)
BUN: 22 mg/dL (ref 8–23)
CO2: 27 mmol/L (ref 22–32)
Calcium: 8.2 mg/dL — ABNORMAL LOW (ref 8.9–10.3)
Chloride: 99 mmol/L (ref 98–111)
Creatinine, Ser: 1.38 mg/dL — ABNORMAL HIGH (ref 0.61–1.24)
GFR, Estimated: 54 mL/min — ABNORMAL LOW
Glucose, Bld: 114 mg/dL — ABNORMAL HIGH (ref 70–99)
Potassium: 3.9 mmol/L (ref 3.5–5.1)
Sodium: 137 mmol/L (ref 135–145)
Total Bilirubin: 1.8 mg/dL — ABNORMAL HIGH (ref 0.3–1.2)
Total Protein: 6.6 g/dL (ref 6.5–8.1)

## 2020-08-19 LAB — HAPTOGLOBIN: Haptoglobin: 318 mg/dL (ref 34–355)

## 2020-08-19 MED ORDER — CYANOCOBALAMIN 500 MCG PO TABS: 500.0000 ug | ORAL_TABLET | Freq: Every day | ORAL | Status: DC

## 2020-08-19 MED ORDER — VITAMIN B-12 100 MCG PO TABS
500.0000 ug | ORAL_TABLET | Freq: Every day | ORAL | Status: DC
  Administered 2020-08-19: 500 ug via ORAL
  Filled 2020-08-19: qty 5

## 2020-08-19 MED ORDER — AMOXICILLIN-POT CLAVULANATE 875-125 MG PO TABS
1.0000 | ORAL_TABLET | Freq: Two times a day (BID) | ORAL | Status: DC
  Administered 2020-08-19: 1 via ORAL
  Filled 2020-08-19: qty 1

## 2020-08-19 MED ORDER — CYANOCOBALAMIN 1000 MCG/ML IJ SOLN
1000.0000 ug | Freq: Once | INTRAMUSCULAR | Status: AC
  Administered 2020-08-19: 1000 ug via INTRAMUSCULAR
  Filled 2020-08-19: qty 1

## 2020-08-19 MED ORDER — AMOXICILLIN-POT CLAVULANATE 875-125 MG PO TABS: 1.0000 | ORAL_TABLET | Freq: Two times a day (BID) | ORAL | 0 refills | Status: DC

## 2020-08-19 NOTE — Discharge Summary (Signed)
Physician Discharge Summary  Austin Bright LYH:[id number] DOB: 1945/06/03 DOA: 08/17/2020  PCP: Doree Albee, MD  Admit date: 08/17/2020 Discharge date: 08/19/2020  Admitted From: Home Disposition:  Home   Recommendations for Outpatient Follow-up:  Follow up with PCP in 1-2 weeks Please obtain BMP/CBC in one week     Discharge Condition: Stable CODE STATUS: FULL Diet recommendation: Heart Healthy    Brief/Interim Summary: 75 year old male with a history of hypertension, hyperlipidemia, coronary artery disease, systolic and diastolic CHF presenting with 3-day history of lower abdominal pain and diarrhea.  The patient states that he was having up to 10 bowel movements on a daily basis for the past 3 days without any hematochezia or melena.  He has also had fevers and chills up to 102.0 F on the day of admission.  He denied any headache, chest pain, shortness of breath, coughing, hemoptysis, nausea, vomiting, hematemesis.  He had some dysuria but denies any hematuria.  He denies any recent travels or any recent antibiotics. In the emergency department, the patient was febrile up to 102.0 F and tachycardic into the 110s.  He was hemodynamically stable with oxygen saturation 93-95% on room air.  BMP showed a sodium 135, potassium 3.9, serum creatinine 1.49.  AST 22, ALT 20, alk phosphatase 75, total bilirubin 1.7.  WBC 16.1, hemoglobin 20.2, platelets 139,000.  CT of the abdomen and pelvis showed hepatomegaly.  There was a proximal pancreatic hypodensity measuring 4 mm.  Distal sigmoid colon showed fat stranding with thickening and microperforation.  There was slight appendiceal enlargement with periappendiceal fat stranding, but this was felt to be secondary to the patient's colonic process.  The patient was started on IV Zosyn and fluids.  Discharge Diagnoses:  Sepsis -Present on admission -Patient presented with fever, tachycardia, and leukocytosis -Secondary to acute  diverticulitis -Lactic acid peaked 1.2 -Continue IV fluids -Continue Zosyn -8/1--discussed with general surgery--Dr. Iona Coach to d/c home -d/c home with amox/clav x 10 days   Acute diverticulitis with microperforation -Continue IV Zosyn>>d/c home with amox/clav x 10 more days -Clear liquid diet -General surgery consult>>nonoperative management -this is his first episode -outpt follow up has been set up with general surgery   Chronic systolic and diastolic CHF -Clinically hypovolemic -Resume metoprolol succinate -09/15/2011 echo EF 30-35% -12/04/2011 echo EF 50-55%, grade 2 DD   Essential hypertension -Holding chlorthalidone -Restart metoprolol succinate -Holding lisinopril   Coronary artery disease -No chest pain presently -Continue metoprolol succinate and Crestor   Hyperglycemia -Check hemoglobin A1c--5.5   Diarrhea -Likely secondary to acute diverticulitis -Stool pathogen panel--pending at time of dc -check Cdiff--neg   Thrombocytopenia -This appears to be chronic dating back to August 2013 -Monitor for signs of bleeding -B12-151>>replete -folate--11.0 -TSH--1.659   Hyperbilirubinemia -mostly indirect bili -check Haptoglobin--318 -Abd US--neg   AKI -due to sepsis and volume depletion -continue IVF -baseline creatinine 0.9-1.2 -serum creatinine peaked 1.53 -serum creatinine 1.38 on day of dc     Discharge Instructions   Allergies as of 08/19/2020       Reactions   Flonase [fluticasone Propionate] Other (See Comments)   Nose bleeds.        Medication List     TAKE these medications    allopurinol 300 MG tablet Commonly known as: ZYLOPRIM TAKE 1 TABLET BY MOUTH ONCE DAILY FOR GOUT.   Alpha Lipoic Acid 200 MG Caps Take 200 mg by mouth daily.   amoxicillin-clavulanate 875-125 MG tablet Commonly known as: AUGMENTIN Take 1 tablet by mouth  every 12 (twelve) hours.   aspirin EC 81 MG tablet Take 81 mg by mouth daily.    chlorthalidone 25 MG tablet Commonly known as: HYGROTON TAKE 1/2 TABLET BY MOUTH ONCE DAILY.   Co Q-10 100 MG Caps Take 100 mg by mouth daily.   Green Tea 150 MG Caps Take 150 mg by mouth daily.   lisinopril 20 MG tablet Commonly known as: ZESTRIL TAKE ONE TABLET BY MOUTH ONCE DAILY.   metoprolol succinate 25 MG 24 hr tablet Commonly known as: Toprol XL Take 1 tablet (25 mg total) by mouth daily.   nitroGLYCERIN 0.4 MG SL tablet Commonly known as: NITROSTAT PLACE 1 TAB UNDER TONGUE EVERY 5 MIN IF NEEDED FOR CHEST PAIN. MAY USE 3 TIMES.NO RELIEF CALL 911.   rosuvastatin 40 MG tablet Commonly known as: CRESTOR Take 1 tablet (40 mg total) by mouth daily.   sildenafil 100 MG tablet Commonly known as: VIAGRA TAKE (1) TABLET BY MOUTH AS NEEDED FOR ERECTILE DYSFUNCTION.   tadalafil 10 MG tablet Commonly known as: CIALIS TAKE ONE TABLET EVERY OTHER DAY AS NEEDED FOR ERECTILE DYSFUNCTION.   Testosterone 20 % Crea Apply 150 mg topically 2 (two) times daily.   Turmeric 500 MG Caps Take 500 mg by mouth daily.   vardenafil 20 MG tablet Commonly known as: LEVITRA Take 1 tablet (20 mg total) by mouth daily as needed for erectile dysfunction.   vitamin C 500 MG tablet Commonly known as: ASCORBIC ACID Take 500 mg by mouth daily.        Allergies  Allergen Reactions   Flonase [Fluticasone Propionate] Other (See Comments)    Nose bleeds.    Consultations: General surgery   Procedures/Studies: US Abdomen Complete  Result Date: 08/18/2020 CLINICAL DATA:  Acute kidney injury, hepatomegaly, thrombocytopenia, hyperbilirubinemia EXAM: ABDOMEN ULTRASOUND COMPLETE COMPARISON:  CT 08/17/2020 FINDINGS: Gallbladder: No gallstones or wall thickening visualized. No sonographic Murphy sign noted by sonographer. Common bile duct: Diameter: Normal caliber, 6 mm Liver: No focal lesion identified. Within normal limits in parenchymal echogenicity. Portal vein is patent on color Doppler  imaging with normal direction of blood flow towards the liver. IVC: No abnormality visualized. Pancreas: Visualized portion unremarkable. Spleen: Size and appearance within normal limits. Right Kidney: Length: 11.3 cm. Echogenicity within normal limits. No mass or hydronephrosis visualized. Left Kidney: Length: 11.3 cm. Echogenicity within normal limits. No mass or hydronephrosis visualized. Abdominal aorta: No aneurysm visualized. Other findings: None. IMPRESSION: No acute findings. Electronically Signed   By: Rolm Baptise M.D.   On: 08/18/2020 12:49   CT ABDOMEN PELVIS W CONTRAST  Result Date: 08/17/2020 CLINICAL DATA:  Abdominal pain, fever Diarrhea. Pt c/o colon cramps and diarrhea that started about 3 days ago. Pt also c/o fever that started yesterday. EXAM: CT ABDOMEN AND PELVIS WITH CONTRAST TECHNIQUE: Multidetector CT imaging of the abdomen and pelvis was performed using the standard protocol following bolus administration of intravenous contrast. CONTRAST:  120mL OMNIPAQUE IOHEXOL 300 MG/ML  SOLN COMPARISON:  None. FINDINGS: Lower chest: No acute abnormality. Hepatobiliary: The liver is enlarged measuring up to 9.5 cm. No focal liver abnormality. Contracted gallbladder with bowel thickening measuring up to 4 mm. No gallstones or pericholecystic fluid. No biliary dilatation. Pancreas: Nonspecific 4 mm hypodensity within proximal pancreas (2:29). Normal pancreatic contour. No surrounding inflammatory changes. No main pancreatic ductal dilatation. Spleen: Normal in size without focal abnormality. Adrenals/Urinary Tract: No adrenal nodule bilaterally. Bilateral kidneys enhance symmetrically. No hydronephrosis. No hydroureter. The urinary bladder is unremarkable.  On delayed imaging, there is no urothelial wall thickening and there are no filling defects in the opacified portions of the bilateral collecting systems or ureters. Stomach/Bowel: Stomach is within normal limits. No evidence of bowel wall  thickening or dilatation. Scattered colonic diverticulosis. Bowel wall thickening and associated pericolonic fat stranding of the distal sigmoid colon. No intramural abscess formation. Slight appendiceal caliber enlargement measuring up to 8 mm with associated periappendiceal fat stranding. Vascular/Lymphatic: No abdominal aorta or iliac aneurysm. Severe atherosclerotic plaque of the aorta and its branches. No abdominal, pelvic, or inguinal lymphadenopathy. Reproductive: Prominent prostate. Other: Trace free fluid as well as a tiny foci of free gas within the pelvis. No organized fluid collection. Musculoskeletal: No abdominal wall hernia or abnormality. No suspicious lytic or blastic osseous lesions. No acute displaced fracture. Multilevel degenerative changes of the spine. IMPRESSION: 1. Pelvic inflammatory changes likely due to complicated distal sigmoid acute diverticulitis with associated microperforation. Associated reactive changes of the adjacent appendix. Less likely differential diagnosis includes perforated acute appendicitis with reactive changes of the sigmoid colon. No pelvic or intramural abscess formation. Recommend colonoscopy status post treatment and status post complete resolution of inflammatory changes to exclude an underlying lesion. 2. Nonspecific 4 mm hypodensity within proximal pancreas. Finding may represent focal fat intercalation or true lesion. Correlate with prior cross-sectional imaging to evaluate for stability. 3. Hepatomegaly. 4. Prominent prostate. 5.  Aortic Atherosclerosis (ICD10-I70.0) - severe. Electronically Signed   By: Iven Finn M.D.   On: 08/17/2020 22:30        Discharge Exam: Vitals:   08/19/20 0959 08/19/20 1235  BP: (!) 140/99 (!) 144/99  Pulse: 95 97  Resp:  19  Temp:  99 F (37.2 C)  SpO2:  97%   Vitals:   08/18/20 2159 08/19/20 0519 08/19/20 0959 08/19/20 1235  BP: 126/75 125/80 (!) 140/99 (!) 144/99  Pulse: (!) 102 93 95 97  Resp: $Remo'18 18   19  'tOyaK$ Temp: (!) 100.4 F (38 C) 99.5 F (37.5 C)  99 F (37.2 C)  TempSrc: Oral Oral  Oral  SpO2: 95% 97%  97%  Weight:      Height:        General: Pt is alert, awake, not in acute distress Cardiovascular: RRR, S1/S2 +, no rubs, no gallops Respiratory: CTA bilaterally, no wheezing, no rhonchi Abdominal: Soft, LLQ pain, ND, bowel sounds + Extremities: no edema, no cyanosis   The results of significant diagnostics from this hospitalization (including imaging, microbiology, ancillary and laboratory) are listed below for reference.    Significant Diagnostic Studies: US Abdomen Complete  Result Date: 08/18/2020 CLINICAL DATA:  Acute kidney injury, hepatomegaly, thrombocytopenia, hyperbilirubinemia EXAM: ABDOMEN ULTRASOUND COMPLETE COMPARISON:  CT 08/17/2020 FINDINGS: Gallbladder: No gallstones or wall thickening visualized. No sonographic Murphy sign noted by sonographer. Common bile duct: Diameter: Normal caliber, 6 mm Liver: No focal lesion identified. Within normal limits in parenchymal echogenicity. Portal vein is patent on color Doppler imaging with normal direction of blood flow towards the liver. IVC: No abnormality visualized. Pancreas: Visualized portion unremarkable. Spleen: Size and appearance within normal limits. Right Kidney: Length: 11.3 cm. Echogenicity within normal limits. No mass or hydronephrosis visualized. Left Kidney: Length: 11.3 cm. Echogenicity within normal limits. No mass or hydronephrosis visualized. Abdominal aorta: No aneurysm visualized. Other findings: None. IMPRESSION: No acute findings. Electronically Signed   By: Rolm Baptise M.D.   On: 08/18/2020 12:49   CT ABDOMEN PELVIS W CONTRAST  Result Date: 08/17/2020 CLINICAL DATA:  Abdominal  pain, fever Diarrhea. Pt c/o colon cramps and diarrhea that started about 3 days ago. Pt also c/o fever that started yesterday. EXAM: CT ABDOMEN AND PELVIS WITH CONTRAST TECHNIQUE: Multidetector CT imaging of the abdomen and  pelvis was performed using the standard protocol following bolus administration of intravenous contrast. CONTRAST:  185mL OMNIPAQUE IOHEXOL 300 MG/ML  SOLN COMPARISON:  None. FINDINGS: Lower chest: No acute abnormality. Hepatobiliary: The liver is enlarged measuring up to 9.5 cm. No focal liver abnormality. Contracted gallbladder with bowel thickening measuring up to 4 mm. No gallstones or pericholecystic fluid. No biliary dilatation. Pancreas: Nonspecific 4 mm hypodensity within proximal pancreas (2:29). Normal pancreatic contour. No surrounding inflammatory changes. No main pancreatic ductal dilatation. Spleen: Normal in size without focal abnormality. Adrenals/Urinary Tract: No adrenal nodule bilaterally. Bilateral kidneys enhance symmetrically. No hydronephrosis. No hydroureter. The urinary bladder is unremarkable. On delayed imaging, there is no urothelial wall thickening and there are no filling defects in the opacified portions of the bilateral collecting systems or ureters. Stomach/Bowel: Stomach is within normal limits. No evidence of bowel wall thickening or dilatation. Scattered colonic diverticulosis. Bowel wall thickening and associated pericolonic fat stranding of the distal sigmoid colon. No intramural abscess formation. Slight appendiceal caliber enlargement measuring up to 8 mm with associated periappendiceal fat stranding. Vascular/Lymphatic: No abdominal aorta or iliac aneurysm. Severe atherosclerotic plaque of the aorta and its branches. No abdominal, pelvic, or inguinal lymphadenopathy. Reproductive: Prominent prostate. Other: Trace free fluid as well as a tiny foci of free gas within the pelvis. No organized fluid collection. Musculoskeletal: No abdominal wall hernia or abnormality. No suspicious lytic or blastic osseous lesions. No acute displaced fracture. Multilevel degenerative changes of the spine. IMPRESSION: 1. Pelvic inflammatory changes likely due to complicated distal sigmoid acute  diverticulitis with associated microperforation. Associated reactive changes of the adjacent appendix. Less likely differential diagnosis includes perforated acute appendicitis with reactive changes of the sigmoid colon. No pelvic or intramural abscess formation. Recommend colonoscopy status post treatment and status post complete resolution of inflammatory changes to exclude an underlying lesion. 2. Nonspecific 4 mm hypodensity within proximal pancreas. Finding may represent focal fat intercalation or true lesion. Correlate with prior cross-sectional imaging to evaluate for stability. 3. Hepatomegaly. 4. Prominent prostate. 5.  Aortic Atherosclerosis (ICD10-I70.0) - severe. Electronically Signed   By: Iven Finn M.D.   On: 08/17/2020 22:30    Microbiology: Recent Results (from the past 240 hour(s))  Resp Panel by RT-PCR (Flu A&B, Covid) Nasopharyngeal Swab     Status: None   Collection Time: 08/17/20 10:11 PM   Specimen: Nasopharyngeal Swab; Nasopharyngeal(NP) swabs in vial transport medium  Result Value Ref Range Status   SARS Coronavirus 2 by RT PCR NEGATIVE NEGATIVE Final    Comment: (NOTE) SARS-CoV-2 target nucleic acids are NOT DETECTED.  The SARS-CoV-2 RNA is generally detectable in upper respiratory specimens during the acute phase of infection. The lowest concentration of SARS-CoV-2 viral copies this assay can detect is 138 copies/mL. A negative result does not preclude SARS-Cov-2 infection and should not be used as the sole basis for treatment or other patient management decisions. A negative result may occur with  improper specimen collection/handling, submission of specimen other than nasopharyngeal swab, presence of viral mutation(s) within the areas targeted by this assay, and inadequate number of viral copies(<138 copies/mL). A negative result must be combined with clinical observations, patient history, and epidemiological information. The expected result is  Negative.  Fact Sheet for Patients:  EntrepreneurPulse.com.au  Fact Sheet  for Healthcare Providers:  IncredibleEmployment.be  This test is no t yet approved or cleared by the [place] and  has been authorized for detection and/or diagnosis of SARS-CoV-2 by FDA under an Emergency Use Authorization (EUA). This EUA will remain  in effect (meaning this test can be used) for the duration of the COVID-19 declaration under Section 564(b)(1) of the Act, 21 U.S.C.section 360bbb-3(b)(1), unless the authorization is terminated  or revoked sooner.       Influenza A by PCR NEGATIVE NEGATIVE Final   Influenza B by PCR NEGATIVE NEGATIVE Final    Comment: (NOTE) The Xpert Xpress SARS-CoV-2/FLU/RSV plus assay is intended as an aid in the diagnosis of influenza from Nasopharyngeal swab specimens and should not be used as a sole basis for treatment. Nasal washings and aspirates are unacceptable for Xpert Xpress SARS-CoV-2/FLU/RSV testing.  Fact Sheet for Patients: EntrepreneurPulse.com.au  Fact Sheet for Healthcare Providers: IncredibleEmployment.be  This test is not yet approved or cleared by the [place] FDA and has been authorized for detection and/or diagnosis of SARS-CoV-2 by FDA under an Emergency Use Authorization (EUA). This EUA will remain in effect (meaning this test can be used) for the duration of the COVID-19 declaration under Section 564(b)(1) of the Act, 21 U.S.C. section 360bbb-3(b)(1), unless the authorization is terminated or revoked.  Performed at [facility], [address] [place], [place] [address]   Blood culture (routine x 2)     Status: None (Preliminary result)   Collection Time: 08/17/20 11:45 PM   Specimen: Left Antecubital; Blood  Result Value Ref Range Status   Specimen Description LEFT ANTECUBITAL  Final   Special Requests   Final    BOTTLES DRAWN AEROBIC AND ANAEROBIC  Blood Culture adequate volume   Culture   Final    NO GROWTH 1 DAY Performed at [facility], [address] [place], [place] [address]    Report Status PENDING  Incomplete  Blood culture (routine x 2)     Status: None (Preliminary result)   Collection Time: 08/17/20 11:55 PM   Specimen: BLOOD LEFT ARM  Result Value Ref Range Status   Specimen Description BLOOD LEFT ARM  Final   Special Requests   Final    BOTTLES DRAWN AEROBIC AND ANAEROBIC Blood Culture adequate volume   Culture   Final    NO GROWTH 1 DAY Performed at [facility], [address], [place], [place] [address]    Report Status PENDING  Incomplete  C Difficile Quick Screen w PCR reflex     Status: None   Collection Time: 08/18/20  6:05 PM   Specimen: Stool  Result Value Ref Range Status   C Diff antigen NEGATIVE NEGATIVE Final   C Diff toxin NEGATIVE NEGATIVE Final   C Diff interpretation No C. difficile detected.  Final    Comment: Performed at [facility], [address] [place], [place] [address]     Labs: Basic Metabolic Panel: Recent Labs  Lab 08/17/20 2040 08/18/20 0622 08/19/20 0525  NA 135 137 137  K 3.9 4.3 3.9  CL 97* 99 99  CO2 $Re'29 29 27  'KNV$ GLUCOSE 159* 122* 114*  BUN 23 24* 22  CREATININE 1.49* 1.53* 1.38*  CALCIUM 9.1 8.3* 8.2*  MG  --  1.9  --   PHOS  --  3.7  --    Liver Function Tests: Recent Labs  Lab 08/17/20 2040 08/18/20 0622 08/18/20 0840 08/19/20 0525  AST 22 19  --  26  ALT 20 19  --  21  ALKPHOS 75 58  --  70  BILITOT 1.7* 2.0* 2.0* 1.8*  PROT 8.1 6.7  --  6.6  ALBUMIN 4.3 3.4*  --  3.1*   Recent Labs  Lab 08/17/20 2040  LIPASE 30   No results for input(s): AMMONIA in the last 168 hours. CBC: Recent Labs  Lab 08/17/20 2040 08/18/20 0314 08/19/20 0525  WBC 16.1* 19.4* 14.7*  HGB 20.2* 18.6* 18.0*  HCT 59.4* 54.3* 53.5*  MCV 96.1 96.1 97.8  PLT 155 139* 141*   Cardiac Enzymes: No results for input(s): CKTOTAL, CKMB, CKMBINDEX, TROPONINI in the last 168  hours. BNP: Invalid input(s): POCBNP CBG: No results for input(s): GLUCAP in the last 168 hours.  Time coordinating discharge:  36 minutes  Signed:  Orson Eva, DO [facility] Pager: [phone] 08/19/2020, 1:34 PM

## 2020-08-19 NOTE — Progress Notes (Signed)
Subjective: Patient states he has had multiple bowel movements which have been slowing up.  His lower abdominal pain seems to have fully resolved.  Objective: Vital signs in last 24 hours: Temp:  [99 F (37.2 C)-100.4 F (38 C)] 99 F (37.2 C) (08/01 1235) Pulse Rate:  [93-102] 97 (08/01 1235) Resp:  [18-19] 19 (08/01 1235) BP: (125-144)/(75-99) 144/99 (08/01 1235) SpO2:  [95 %-97 %] 97 % (08/01 1235) Last BM Date: 08/19/20  Intake/Output from previous day: 07/31 0701 - 08/01 0700 In: 50 [IV Piggyback:50] Out: -  Intake/Output this shift: Total I/O In: 695.2 [P.O.:596; IV Piggyback:99.2] Out: -   General appearance: alert, cooperative, and no distress GI: soft, non-tender; bowel sounds normal; no masses,  no organomegaly  Lab Results:  Recent Labs    08/18/20 0314 08/19/20 0525  WBC 19.4* 14.7*  HGB 18.6* 18.0*  HCT 54.3* 53.5*  PLT 139* 141*   BMET Recent Labs    08/18/20 0622 08/19/20 0525  NA 137 137  K 4.3 3.9  CL 99 99  CO2 29 27  GLUCOSE 122* 114*  BUN 24* 22  CREATININE 1.53* 1.38*  CALCIUM 8.3* 8.2*   PT/INR Recent Labs    08/18/20 0314  LABPROT 16.7*  INR 1.4*    Studies/Results: US Abdomen Complete  Result Date: 08/18/2020 CLINICAL DATA:  Acute kidney injury, hepatomegaly, thrombocytopenia, hyperbilirubinemia EXAM: ABDOMEN ULTRASOUND COMPLETE COMPARISON:  CT 08/17/2020 FINDINGS: Gallbladder: No gallstones or wall thickening visualized. No sonographic Murphy sign noted by sonographer. Common bile duct: Diameter: Normal caliber, 6 mm Liver: No focal lesion identified. Within normal limits in parenchymal echogenicity. Portal vein is patent on color Doppler imaging with normal direction of blood flow towards the liver. IVC: No abnormality visualized. Pancreas: Visualized portion unremarkable. Spleen: Size and appearance within normal limits. Right Kidney: Length: 11.3 cm. Echogenicity within normal limits. No mass or hydronephrosis visualized.  Left Kidney: Length: 11.3 cm. Echogenicity within normal limits. No mass or hydronephrosis visualized. Abdominal aorta: No aneurysm visualized. Other findings: None. IMPRESSION: No acute findings. Electronically Signed   By: Charlett Nose M.D.   On: 08/18/2020 12:49   CT ABDOMEN PELVIS W CONTRAST  Result Date: 08/17/2020 CLINICAL DATA:  Abdominal pain, fever Diarrhea. Pt c/o colon cramps and diarrhea that started about 3 days ago. Pt also c/o fever that started yesterday. EXAM: CT ABDOMEN AND PELVIS WITH CONTRAST TECHNIQUE: Multidetector CT imaging of the abdomen and pelvis was performed using the standard protocol following bolus administration of intravenous contrast. CONTRAST:  OMNIPAQUE IOHEXOL 300 MG/ML  SOLN COMPARISON:  None. FINDINGS: Lower chest: No acute abnormality. Hepatobiliary: The liver is enlarged measuring up to 9.5 cm. No focal liver abnormality. Contracted gallbladder with bowel thickening measuring up to 4 mm. No gallstones or pericholecystic fluid. No biliary dilatation. Pancreas: Nonspecific 4 mm hypodensity within proximal pancreas (2:29). Normal pancreatic contour. No surrounding inflammatory changes. No main pancreatic ductal dilatation. Spleen: Normal in size without focal abnormality. Adrenals/Urinary Tract: No adrenal nodule bilaterally. Bilateral kidneys enhance symmetrically. No hydronephrosis. No hydroureter. The urinary bladder is unremarkable. On delayed imaging, there is no urothelial wall thickening and there are no filling defects in the opacified portions of the bilateral collecting systems or ureters. Stomach/Bowel: Stomach is within normal limits. No evidence of bowel wall thickening or dilatation. Scattered colonic diverticulosis. Bowel wall thickening and associated pericolonic fat stranding of the distal sigmoid colon. No intramural abscess formation. Slight appendiceal caliber enlargement measuring up to 8 mm with associated periappendiceal fat  stranding.  Vascular/Lymphatic: No abdominal aorta or iliac aneurysm. Severe atherosclerotic plaque of the aorta and its branches. No abdominal, pelvic, or inguinal lymphadenopathy. Reproductive: Prominent prostate. Other: Trace free fluid as well as a tiny foci of free gas within the pelvis. No organized fluid collection. Musculoskeletal: No abdominal wall hernia or abnormality. No suspicious lytic or blastic osseous lesions. No acute displaced fracture. Multilevel degenerative changes of the spine. IMPRESSION: 1. Pelvic inflammatory changes likely due to complicated distal sigmoid acute diverticulitis with associated microperforation. Associated reactive changes of the adjacent appendix. Less likely differential diagnosis includes perforated acute appendicitis with reactive changes of the sigmoid colon. No pelvic or intramural abscess formation. Recommend colonoscopy status post treatment and status post complete resolution of inflammatory changes to exclude an underlying lesion. 2. Nonspecific 4 mm hypodensity within proximal pancreas. Finding may represent focal fat intercalation or true lesion. Correlate with prior cross-sectional imaging to evaluate for stability. 3. Hepatomegaly. 4. Prominent prostate. 5.  Aortic Atherosclerosis (ICD10-I70.0) - severe. Electronically Signed   By: Tish Frederickson M.D.   On: 08/17/2020 22:30    Anti-infectives: Anti-infectives (From admission, onward)    Start     Dose/Rate Route Frequency Ordered Stop   08/19/20 1430  amoxicillin-clavulanate (AUGMENTIN) 875-125 MG per tablet 1 tablet        1 tablet Oral Every 12 hours 08/19/20 1332     08/19/20 0000  amoxicillin-clavulanate (AUGMENTIN) 875-125 MG tablet        1 tablet Oral Every 12 hours 08/19/20 1333     08/18/20 0600  piperacillin-tazobactam (ZOSYN) IVPB 3.375 g  Status:  Discontinued        3.375 g 12.5 mL/hr over 240 Minutes Intravenous Every 8 hours 08/18/20 0327 08/19/20 1332   08/17/20 2315  piperacillin-tazobactam  (ZOSYN) IVPB 3.375 g        3.375 g 100 mL/hr over 30 Minutes Intravenous  Once 08/17/20 2300 08/18/20 0142       Assessment/Plan: Impression: Sigmoid diverticulitis with microperforation.  This seems to be the primary etiology of his pain.  Doubt appendicitis being the primary. Plan: I did see the patient prior to discharge.  Patient is cleared from surgery standpoint.  He should be discharged on Augmentin.  He will follow-up with [place] in several weeks.  Discussed with Dr. Arbutus Leas.  LOS: 2 days    Franky Macho 08/19/2020

## 2020-08-19 NOTE — Progress Notes (Signed)
Nsg Discharge Note  Admit Date:  08/17/2020 Discharge date: 08/19/2020   Austin Bright to be D/C'd Home per MD order.  AVS completed.  Copy for chart, and copy for patient signed, and dated. Patient/caregiver able to verbalize understanding.  Discharge Medication: Allergies as of 08/19/2020       Reactions   Flonase [fluticasone Propionate] Other (See Comments)   Nose bleeds.        Medication List     TAKE these medications    allopurinol 300 MG tablet Commonly known as: ZYLOPRIM TAKE 1 TABLET BY MOUTH ONCE DAILY FOR GOUT.   Alpha Lipoic Acid 200 MG Caps Take 200 mg by mouth daily.   amoxicillin-clavulanate 875-125 MG tablet Commonly known as: AUGMENTIN Take 1 tablet by mouth every 12 (twelve) hours.   aspirin EC 81 MG tablet Take 81 mg by mouth daily.   chlorthalidone 25 MG tablet Commonly known as: HYGROTON TAKE 1/2 TABLET BY MOUTH ONCE DAILY.   Co Q-10 100 MG Caps Take 100 mg by mouth daily.   Green Tea 150 MG Caps Take 150 mg by mouth daily.   lisinopril 20 MG tablet Commonly known as: ZESTRIL TAKE ONE TABLET BY MOUTH ONCE DAILY.   metoprolol succinate 25 MG 24 hr tablet Commonly known as: Toprol XL Take 1 tablet (25 mg total) by mouth daily.   nitroGLYCERIN 0.4 MG SL tablet Commonly known as: NITROSTAT PLACE 1 TAB UNDER TONGUE EVERY 5 MIN IF NEEDED FOR CHEST PAIN. MAY USE 3 TIMES.NO RELIEF CALL 911.   rosuvastatin 40 MG tablet Commonly known as: CRESTOR Take 1 tablet (40 mg total) by mouth daily.   sildenafil 100 MG tablet Commonly known as: VIAGRA TAKE (1) TABLET BY MOUTH AS NEEDED FOR ERECTILE DYSFUNCTION.   tadalafil 10 MG tablet Commonly known as: CIALIS TAKE ONE TABLET EVERY OTHER DAY AS NEEDED FOR ERECTILE DYSFUNCTION.   Testosterone 20 % Crea Apply 150 mg topically 2 (two) times daily.   Turmeric 500 MG Caps Take 500 mg by mouth daily.   vardenafil 20 MG tablet Commonly known as: LEVITRA Take 1 tablet (20 mg total) by mouth  daily as needed for erectile dysfunction.   vitamin B-12 500 MCG tablet Commonly known as: CYANOCOBALAMIN Take 1 tablet (500 mcg total) by mouth daily.   vitamin C 500 MG tablet Commonly known as: ASCORBIC ACID Take 500 mg by mouth daily.        Discharge Assessment: Vitals:   08/19/20 0959 08/19/20 1235  BP: (!) 140/99 (!) 144/99  Pulse: 95 97  Resp:  19  Temp:  99 F (37.2 C)  SpO2:  97%   Skin clean, dry and intact without evidence of skin break down, no evidence of skin tears noted. IV catheter discontinued intact. Site without signs and symptoms of complications - no redness or edema noted at insertion site, patient denies c/o pain - only slight tenderness at site.  Dressing with slight pressure applied.  D/c Instructions-Education: Discharge instructions given to patient/family with verbalized understanding. D/c education completed with patient/family including follow up instructions, medication list, d/c activities limitations if indicated, with other d/c instructions as indicated by MD - patient able to verbalize understanding, all questions fully answered. Patient instructed to return to ED, call 911, or call MD for any changes in condition.  Patient escorted via WC, and D/C home via private auto.  Brandy Hale, LPN 07/28/238 9:73 PM

## 2020-08-23 LAB — CULTURE, BLOOD (ROUTINE X 2)
Culture: NO GROWTH
Culture: NO GROWTH
Special Requests: ADEQUATE
Special Requests: ADEQUATE

## 2020-08-28 ENCOUNTER — Other Ambulatory Visit: Payer: Self-pay | Admitting: Cardiology

## 2020-08-29 ENCOUNTER — Telehealth: Payer: Self-pay | Admitting: Cardiology

## 2020-08-29 MED ORDER — SILDENAFIL CITRATE 100 MG PO TABS: ORAL_TABLET | ORAL | 0 refills | Status: DC

## 2020-08-29 NOTE — Telephone Encounter (Signed)
Medication refill request approved. Pt has appt with Nada Boozer 09/27/20

## 2020-08-29 NOTE — Telephone Encounter (Signed)
New message     *STAT* If patient is at the pharmacy, call can be transferred to refill team.   1. Which medications need to be refilled? (please list name of each medication and dose if known)  sildenafil  2. Which pharmacy/location (including street and city if local pharmacy) is medication to be sent to? [facility]   3. Do they need a 30 day or 90 day supply? 30

## 2020-09-17 ENCOUNTER — Ambulatory Visit: Payer: Medicare PPO | Admitting: General Surgery

## 2020-09-17 ENCOUNTER — Encounter: Payer: Self-pay | Admitting: General Surgery

## 2020-09-17 ENCOUNTER — Other Ambulatory Visit: Payer: Self-pay

## 2020-09-17 VITALS — BP 124/81 | HR 73 | Temp 99.4°F | Resp 14 | Ht 70.0 in | Wt 202.0 lb

## 2020-09-17 DIAGNOSIS — K5730 Diverticulosis of large intestine without perforation or abscess without bleeding: Secondary | ICD-10-CM | POA: Diagnosis not present

## 2020-09-17 NOTE — Progress Notes (Signed)
Subjective:     Austin Bright  Patient here for follow-up after hospitalization for sigmoid diverticulitis with microperforation.  He has done very well and has completed his antibiotic course.  His abdominal pain subsided soon after discharge.  His bowel movements are regular.  He denies any fever or chills.  This was his first episode. Objective:    BP 124/81   Pulse 73   Temp 99.4 F (37.4 C) (Other (Comment))   Resp 14   Ht 5\' 10"  (1.778 m)   Wt 202 lb (91.6 kg)   SpO2 96%   BMI 28.98 kg/m   General:  alert, cooperative, and no distress  Abdomen is soft, nontender, nondistended.  No rigidity is noted.     Assessment:    Sigmoid diverticulitis with microperforation, resolved .    Plan:   No need for elective colectomy at this time given that it is his first episode and it resolved quickly with conservative management.  He thinks this was triggered by eating excessive red meat.  He was told to resume his regular diet with moderation.  Follow-up as needed.

## 2020-09-26 ENCOUNTER — Ambulatory Visit: Payer: Medicare PPO | Admitting: Internal Medicine

## 2020-09-26 ENCOUNTER — Other Ambulatory Visit: Payer: Self-pay

## 2020-09-26 ENCOUNTER — Encounter: Payer: Self-pay | Admitting: Internal Medicine

## 2020-09-26 VITALS — BP 126/69 | HR 77 | Temp 98.5°F | Resp 18 | Ht 70.0 in | Wt 202.0 lb

## 2020-09-26 DIAGNOSIS — K5792 Diverticulitis of intestine, part unspecified, without perforation or abscess without bleeding: Secondary | ICD-10-CM | POA: Diagnosis not present

## 2020-09-26 DIAGNOSIS — I2510 Atherosclerotic heart disease of native coronary artery without angina pectoris: Secondary | ICD-10-CM

## 2020-09-26 DIAGNOSIS — I10 Essential (primary) hypertension: Secondary | ICD-10-CM

## 2020-09-26 DIAGNOSIS — M1000 Idiopathic gout, unspecified site: Secondary | ICD-10-CM

## 2020-09-26 DIAGNOSIS — Z7689 Persons encountering health services in other specified circumstances: Secondary | ICD-10-CM | POA: Diagnosis not present

## 2020-09-26 DIAGNOSIS — N179 Acute kidney failure, unspecified: Secondary | ICD-10-CM | POA: Diagnosis not present

## 2020-09-26 DIAGNOSIS — Z23 Encounter for immunization: Secondary | ICD-10-CM | POA: Diagnosis not present

## 2020-09-26 NOTE — Progress Notes (Signed)
Cardiology Office Note   Date:  09/27/2020   ID:  Austin Bright, DOB 02/22/1945, MRN [id number]  PCP:  Anabel Halon, MD  Cardiologist:  Dr. Wyline Mood    Chief Complaint  Patient presents with   Coronary Artery Disease      History of Present Illness: Austin Bright is a 75 y.o. male who presents for CAD.  He has a history of hypertension, hyperlipidemia, coronary artery disease, CAD (s/p DES to mid-LAD in 08/2011), HTN, HLD and prior tobacco use systolic and diastolic CHF.    He was  admitted end of July with sepsis due to acute diverticulitis.  Also with AKI. He has recovered and feels great.  He has new PCP as well and was recently seen. Labs done with PCP Hgb 18.6 and plts 145,  Tchol 167, T 53 HDL 64 LDL 82  TSH 1.030, M7W 5.7 Cr 1.22 Na 140 and K+ 4.8  He denies chest pain or SOB.    He needs refill on NTG.  He would like refill on Viagra but Dr. Allena Katz has agreed to order which may be better for the pt.  We discussed not to use within 72 hours of Viagra, he is also he is also on Cialis (new dosing) from urology. With his sepsis on CTA of abd he had severe aortic atherosclerosis.  He did have follow up ultrasound and was neg nor aneurysm.  He is on statin. No claudication.    He is active and eats healthy.  He will need colonoscopy in future.   Past Surgical History:  Procedure Laterality Date   carpel tunnel Bilateral 2002   COLONOSCOPY WITH PROPOFOL N/A 04/08/2017   Procedure: COLONOSCOPY WITH PROPOFOL;  Surgeon: Corbin Ade, MD;  Location: [facility]ITE;  Service: Endoscopy;  Laterality: N/A;  11:15am   CORONARY STENT PLACEMENT     CYSTECTOMY  1969   pilonidal cyst   LEFT AND RIGHT HEART CATHETERIZATION WITH CORONARY ANGIOGRAM N/A 09/15/2011   Procedure: LEFT AND RIGHT HEART CATHETERIZATION WITH CORONARY ANGIOGRAM;  Surgeon: Herby Abraham, MD;  Location: [facility] CATH LAB;  Service: Cardiovascular;  Laterality: N/A;   LEFT HEART CATH N/A 08/23/2011   Procedure:  LEFT HEART CATH;  Surgeon: Iran Ouch, MD;  Location: [facility] CATH LAB;  Service: Cardiovascular;  Laterality: N/A;   NO PAST SURGERIES     PERCUTANEOUS CORONARY STENT INTERVENTION (PCI-S)  08/23/2011   Procedure: PERCUTANEOUS CORONARY STENT INTERVENTION (PCI-S);  Surgeon: Iran Ouch, MD;  Location: [facility] CATH LAB;  Service: Cardiovascular;;     Current Outpatient Medications  Medication Sig Dispense Refill   allopurinol (ZYLOPRIM) 300 MG tablet TAKE 1 TABLET BY MOUTH ONCE DAILY FOR GOUT. 90 tablet 0   Alpha Lipoic Acid 200 MG CAPS Take 200 mg by mouth daily.      aspirin EC 81 MG tablet Take 81 mg by mouth daily.     chlorthalidone (HYGROTON) 25 MG tablet TAKE 1/2 TABLET BY MOUTH ONCE DAILY. 45 tablet 3   Coenzyme Q10 (CO Q-10) 100 MG CAPS Take 100 mg by mouth daily.      Green Tea 150 MG CAPS Take 150 mg by mouth daily.      lisinopril (ZESTRIL) 20 MG tablet TAKE ONE TABLET BY MOUTH ONCE DAILY. 90 tablet 0   metoprolol succinate (TOPROL XL) 25 MG 24 hr tablet Take 1 tablet (25 mg total) by mouth daily. 90 tablet 3   nitroGLYCERIN (NITROSTAT)  0.4 MG SL tablet PLACE 1 TAB UNDER TONGUE EVERY 5 MIN IF NEEDED FOR CHEST PAIN. MAY USE 3 TIMES.NO RELIEF CALL 911. 25 tablet 0   rosuvastatin (CRESTOR) 40 MG tablet Take 1 tablet (40 mg total) by mouth daily. 90 tablet 1   sildenafil (VIAGRA) 100 MG tablet TAKE (1) TABLET BY MOUTH AS NEEDED FOR ERECTILE DYSFUNCTION. 30 tablet 0   tadalafil (CIALIS) 10 MG tablet TAKE ONE TABLET EVERY OTHER DAY AS NEEDED FOR ERECTILE DYSFUNCTION. 20 tablet 3   Testosterone 20 % CREA Apply 150 mg topically 2 (two) times daily. 100 g 2   Turmeric 500 MG CAPS Take 500 mg by mouth daily.      vitamin B-12 (CYANOCOBALAMIN) 500 MCG tablet Take 1 tablet (500 mcg total) by mouth daily.     vitamin C (ASCORBIC ACID) 500 MG tablet Take 500 mg by mouth daily.     No current facility-administered medications for this visit.    Allergies:   Flonase [fluticasone propionate]     Social History:  The patient  reports that he quit smoking about 41 years ago. His smoking use included cigarettes. He has a 25.00 pack-year smoking history. He has never used smokeless tobacco. He reports current alcohol use of about 4.0 standard drinks per week. He reports that he does not use drugs.   Family History:  The patient's family history includes Hypertension in his father; Stroke in his mother.    ROS:  General:no colds or fevers, no weight changes Skin:no rashes or ulcers HEENT:no blurred vision, no congestion CV:see HPI PUL:see HPI GI:no diarrhea constipation or melena, no indigestion GU:no hematuria, no dysuria MS:no joint pain, no claudication Neuro:no syncope, no lightheadedness Endo:no diabetes, no thyroid disease  Wt Readings from Last 3 Encounters:  09/27/20 201 lb 9.6 oz (91.4 kg)  09/26/20 202 lb (91.6 kg)  09/17/20 202 lb (91.6 kg)     PHYSICAL EXAM: VS:  BP 124/80   Pulse 73   Ht 5\' 10"  (1.778 m)   Wt 201 lb 9.6 oz (91.4 kg)   SpO2 97%   BMI 28.93 kg/m  , BMI Body mass index is 28.93 kg/m. General:Pleasant affect, NAD Skin:Warm and dry, brisk capillary refill HEENT:normocephalic, sclera clear, mucus membranes moist Neck:supple, no JVD, no bruits  Heart:S1S2 RRR without murmur, gallup, rub or click Lungs:clear without rales, rhonchi, or wheezes , non tender, + BS, do not palpate liver spleen or masses Ext:no lower ext edema, 2+ pedal pulses, 2+ radial pulses Neuro:alert and oriented, MAE, follows commands, + facial symmetry    EKG:  EKG is ordered today. The ekg ordered today demonstrates SR at 70 LAFB poor R wave progression no changes from prior.  Stable EKG   Recent Labs: 08/17/2020: TSH 1.659 08/18/2020: Magnesium 1.9 08/19/2020: ALT 21; BUN 22; Creatinine, Ser 1.38; Hemoglobin 18.0; Platelets 141; Potassium 3.9; Sodium 137    Lipid Panel    Component Value Date/Time   CHOL 147 10/21/2018 0836   TRIG 86 10/21/2018 0836    HDL 55 10/21/2018 0836   CHOLHDL 2.7 10/21/2018 0836   VLDL 17 10/21/2018 0836   LDLCALC 75 10/21/2018 0836       Other studies Reviewed: Additional studies/ records that were reviewed today include: .  Echo 2013 Echocardiogram: 2013 Study Conclusions   - Left ventricle: The cavity size was normal. Wall thickness    was normal. Systolic function was normal. The estimated    ejection fraction was in the range of  50% to 55%. There is    hypokinesis of the mid-distalinferoseptal myocardium.    There is hypokinesis of the distalanteroseptal myocardium.    Features are consistent with a pseudonormal left    ventricular filling pattern, with concomitant abnormal    relaxation and increased filling pressure (grade 2    diastolic dysfunction). Doppler parameters are consistent    with elevated ventricular end-diastolic filling pressure.  - Aortic valve: Mildly calcified annulus. Trileaflet; mildly    calcified leaflets.  - Mitral valve: Trivial regurgitation.  - Left atrium: The atrium was mildly dilated.  - Right ventricle: The cavity size was mildly dilated.  - Tricuspid valve: Trivial regurgitation.  - Pulmonary arteries: PA peak pressure: 11mm Hg (S).   Cardiopulmonary test 2021 AInterpretation   Notes: Patient gave a very good effort. Pulse-oximetry remained 97-100% for the duration of exercise. Exercise was performed on on a treadmill beginning at 2.38mph and 0% grade increasing to steady 3.0 mph and 0% grade with 2.5% grade every other minute with modifications in later exercise (see attached document for time-down stages)    ECG:  Resting ECG in normal sinus rhythm with left anterior fascicular block. HR response appropriate. There were frequent PVCs with no sustained arrhythmias or ST-T changes. BP response appropriate.   PFT:  Pre-exercise spirometry was within mostly normal limits. The MVV was normal.     CPX:  Exercise testing with gas exchange demonstrates a normal peak  VO2 of 23.2 ml/kg/min (100% of the age/gender/weight matched sedentary norms). The RER of 1.12 indicates a maximal effort. When adjusted to the patient's ideal body weight of 175.8 lb (79.8 kg) the peak VO2 is 27.8 ml/kg (ibw)/min (105% of the ibw-adjusted predicted). The VE/VCO2 slope is is normal. The oxygen uptake efficiency slope (OUES) is normal. The VO2 at the ventilatory threshold was normal at 81% of the predicted peak VO2. At peak exercise, the ventilation reached 67% of the measured MVV indicating ventilatory reserve remaine. The O2pulse (a surrogate for stroke volume) increased with incremental exercise, reaching peak at 60ml/beat (100% predicted).    Conclusion: Exercise teting with gas exchange demonstrates normal functional capacity when compared to matched sedentary norms. There is no indication for cardiopulmonary abnormality. Patient's anaerobic threshold occurred at 119bpm and can be utilized in interval training to improve cardiovascular fitness.  SSESSMENT AND PLAN:  1.  CAD with hx of MI, CAD and (s/p DES to mid-LAD in 08/2011),  he is stable without angina and had normal CPCX last year. Continue ASA, BB  If he needs colonoscopy he is low cardiac risk for low risk procedure meeting 4 METS without angina.  --hospital records reviewed and labs  2.  Aortic atherosclerosis- severe on CT, no aneurysm on ultrasound.  He is on statin and needs LDL < 70 for CAD and aortic atherosclerosis.  No claudication.  If claudication develops  would need lower ext arterial dopplers.     3.  HLD on crestor 40 goal LDL < 70 would recommend zetia to decrease.  Healthy diet and exercise.  4.  HTN controlled. Continue current meds.    5.  CKD now 3a improved from sepsis episode   6.  ED will obtain meds from PCP, understands to not take within 72 hour sof NTG.  He has not used NTG in some time.    Current medicines are reviewed with the patient today.  The patient Has no concerns regarding  medicines.  The following changes have been made:  See  above Labs/ tests ordered today include:see above  Disposition:   FU:  see above  Signed, Nada BoozerLaura Adonai Helzer, NP  09/27/2020 1:15 PM    [facility] [address], [place], [place]  [address]/ [address] [place], [place] Phone: [phone]; Fax: [phone]  [phone]

## 2020-09-26 NOTE — Patient Instructions (Signed)
Please continue taking medications as prescribed.  Please schedule appointment with Dr Jena Gauss for colonoscopy.  Please continue to follow low salt diet and perform moderate exercise/walking at least 150 mins/week.

## 2020-09-27 ENCOUNTER — Encounter: Payer: Self-pay | Admitting: Internal Medicine

## 2020-09-27 ENCOUNTER — Encounter: Payer: Self-pay | Admitting: Cardiology

## 2020-09-27 ENCOUNTER — Ambulatory Visit: Payer: Medicare PPO | Admitting: Cardiology

## 2020-09-27 VITALS — BP 124/80 | HR 73 | Ht 70.0 in | Wt 201.6 lb

## 2020-09-27 DIAGNOSIS — I10 Essential (primary) hypertension: Secondary | ICD-10-CM

## 2020-09-27 DIAGNOSIS — I2510 Atherosclerotic heart disease of native coronary artery without angina pectoris: Secondary | ICD-10-CM

## 2020-09-27 DIAGNOSIS — N1831 Chronic kidney disease, stage 3a: Secondary | ICD-10-CM | POA: Diagnosis not present

## 2020-09-27 DIAGNOSIS — E785 Hyperlipidemia, unspecified: Secondary | ICD-10-CM | POA: Diagnosis not present

## 2020-09-27 DIAGNOSIS — N528 Other male erectile dysfunction: Secondary | ICD-10-CM

## 2020-09-27 MED ORDER — NITROGLYCERIN 0.4 MG SL SUBL: SUBLINGUAL_TABLET | SUBLINGUAL | 3 refills | Status: AC

## 2020-09-27 NOTE — Assessment & Plan Note (Signed)
Likely due to acute infection related dehydration Advised to maintain adequate hydration Check CMP

## 2020-09-27 NOTE — Assessment & Plan Note (Signed)
Recent episode, treated with IVF and antibiotics F/u with GI for colonoscopy

## 2020-09-27 NOTE — Assessment & Plan Note (Addendum)
BP Readings from Last 1 Encounters:  09/27/20 124/80   Well-controlled with Lisinopril, Chlorthalidone and Metoprolol Counseled for compliance with the medications Advised DASH diet and moderate exercise/walking, at least 150 mins/week

## 2020-09-27 NOTE — Patient Instructions (Signed)
Medication Instructions:  Your physician recommends that you continue on your current medications as directed. Please refer to the Current Medication list given to you today.  *If you need a refill on your cardiac medications before your next appointment, please call your pharmacy*   Lab Work: NONE   If you have labs (blood work) drawn today and your tests are completely normal, you will receive your results only by: . MyChart Message (if you have MyChart) OR . A paper copy in the mail If you have any lab test that is abnormal or we need to change your treatment, we will call you to review the results.   Testing/Procedures: NONE    Follow-Up: At [facility], you and your health needs are our priority.  As part of our continuing mission to provide you with exceptional heart care, we have created designated Provider Care Teams.  These Care Teams include your primary Cardiologist (physician) and Advanced Practice Providers (APPs -  Physician Assistants and Nurse Practitioners) who all work together to provide you with the care you need, when you need it.  We recommend signing up for the patient portal called "MyChart".  Sign up information is provided on this After Visit Summary.  MyChart is used to connect with patients for Virtual Visits (Telemedicine).  Patients are able to view lab/test results, encounter notes, upcoming appointments, etc.  Non-urgent messages can be sent to your provider as well.   To learn more about what you can do with MyChart, go to [email or web address].    Your next appointment:   1 year(s)  The format for your next appointment:   In Person  Provider:   Jonathan Branch, MD   Other Instructions Thank you for choosing [facility]!    

## 2020-09-27 NOTE — Progress Notes (Signed)
New Patient Office Visit  Subjective:  Patient ID: Austin Bright, male    DOB: 1945-04-01  Age: 75 y.o. MRN: [id number]  CC:  Chief Complaint  Patient presents with   New Patient (Initial Visit)    New patient was seeing dr Austin Bright had a recent hospital stay at [facility] with diverticulitis would like for you to review CT from that stay also needs meds refills has not had a lot of strength recently.    HPI Austin Bright is a 75 y.o. male with PMH of CAD s/p stent placement, HTN, HLD, gout and diverticulitis who presents for establishing care.  HTN and CAD: BP is well-controlled. Takes medications regularly. Patient denies headache, dizziness, chest pain, dyspnea or palpitations.  He follows up with cardiology.  He has a history of stent placement, and takes aspirin and statin.  He also takes metoprolol and lisinopril.  HLD: He takes Crestor.  He was recently hospitalized for acute diverticulitis, which was treated with IVF and antibiotics.  He denies any LLQ pain currently.  He has had colonoscopy in the past, done by Dr. Gala Romney.  He currently denies any melena or hematochezia.  Denies any nausea, vomiting, abdominal pain or diarrhea.  He still feels mild fatigue since that episode.  He is up-to-date with COVID-vaccine.  He received flu vaccine in the office today.     Past Medical History:  Diagnosis Date   Acute MI ([place]) 08/2011   Arthritis    Bilateral pneumonia    Diagnosed after STEMI 08/2011   CAD (coronary artery disease)    a. Diagnosed 08/2011 with anterior STEMI due to thrombotic occlusion of mid LAD s/p thrombectomy, PTCA, DES placement 08/23/11.   CHF (congestive heart failure) (HCC)    Dyslipidemia    Gout    Hypertension    Ischemic cardiomyopathy    a. Initial EF 35% by cath 08/23/11, improved to 40-45% by echo 08/25/11.   Shortness of breath     Past Surgical History:  Procedure Laterality Date   carpel tunnel Bilateral 2002   COLONOSCOPY WITH  PROPOFOL N/A 04/08/2017   Procedure: COLONOSCOPY WITH PROPOFOL;  Surgeon: Daneil Dolin, MD;  Location: [facility];  Service: Endoscopy;  Laterality: N/A;  11:15am   CORONARY STENT PLACEMENT     CYSTECTOMY  1969   pilonidal cyst   LEFT AND RIGHT HEART CATHETERIZATION WITH CORONARY ANGIOGRAM N/A 09/15/2011   Procedure: LEFT AND RIGHT HEART CATHETERIZATION WITH CORONARY ANGIOGRAM;  Surgeon: Hillary Bow, MD;  Location: [facility] CATH LAB;  Service: Cardiovascular;  Laterality: N/A;   LEFT HEART CATH N/A 08/23/2011   Procedure: LEFT HEART CATH;  Surgeon: Wellington Hampshire, MD;  Location: [place] CATH LAB;  Service: Cardiovascular;  Laterality: N/A;   NO PAST SURGERIES     PERCUTANEOUS CORONARY STENT INTERVENTION (PCI-S)  08/23/2011   Procedure: PERCUTANEOUS CORONARY STENT INTERVENTION (PCI-S);  Surgeon: Wellington Hampshire, MD;  Location: [facility] CATH LAB;  Service: Cardiovascular;;    Family History  Problem Relation Age of Onset   Hypertension Father    Stroke Mother    Colon cancer Neg Hx     Social History   Socioeconomic History   Marital status: Married    Spouse name: Not on file   Number of children: Not on file   Years of education: Not on file   Highest education level: Not on file  Occupational History   Occupation: Comptroller  Tobacco Use  Smoking status: Former    Packs/day: 1.00    Years: 25.00    Pack years: 25.00    Types: Cigarettes    Quit date: 08/23/1979    Years since quitting: 41.1   Smokeless tobacco: Never  Vaping Use   Vaping Use: Never used  Substance and Sexual Activity   Alcohol use: Yes    Alcohol/week: 4.0 standard drinks    Types: 2 Cans of beer, 2 Shots of liquor per week    Comment: Weekends up to 3 times a week; 2-3 drinks per sitting.   Drug use: No   Sexual activity: Yes  Other Topics Concern   Not on file  Social History Narrative   Widower since 85,married for 10 years.Lives alone,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.Has a  girlfriend.[employer].   Social Determinants of Health   Financial Resource Strain: Not on file  Food Insecurity: Not on file  Transportation Needs: Not on file  Physical Activity: Not on file  Stress: Not on file  Social Connections: Not on file  Intimate Partner Violence: Not on file    ROS Review of Systems  Constitutional:  Positive for fatigue. Negative for chills and fever.  HENT:  Negative for congestion and sore throat.   Eyes:  Negative for pain and discharge.  Respiratory:  Negative for cough and shortness of breath.   Cardiovascular:  Negative for chest pain and palpitations.  Gastrointestinal:  Negative for constipation, diarrhea, nausea and vomiting.  Endocrine: Negative for polydipsia and polyuria.  Genitourinary:  Negative for dysuria and hematuria.  Musculoskeletal:  Negative for neck pain and neck stiffness.  Skin:  Negative for rash.  Neurological:  Negative for dizziness, weakness, numbness and headaches.  Psychiatric/Behavioral:  Negative for agitation and behavioral problems.    Objective:   Today's Vitals: BP 126/69 (BP Location: Left Arm, Patient Position: Sitting, Cuff Size: Normal)   Pulse 77   Temp 98.5 F (36.9 C) (Oral)   Resp 18   Ht $R'5\' 10"'ff$  (1.778 m)   Wt 202 lb (91.6 kg)   SpO2 97%   BMI 28.98 kg/m   Physical Exam Vitals reviewed.  Constitutional:      General: He is not in acute distress.    Appearance: He is not diaphoretic.  HENT:     Head: Normocephalic and atraumatic.     Nose: Nose normal.     Mouth/Throat:     Mouth: Mucous membranes are moist.  Eyes:     General: No scleral icterus.    Extraocular Movements: Extraocular movements intact.  Cardiovascular:     Rate and Rhythm: Normal rate and regular rhythm.     Pulses: Normal pulses.     Heart sounds: Normal heart sounds. No murmur heard. Pulmonary:     Breath sounds: Normal breath sounds. No wheezing or rales.  Abdominal:     Palpations: Abdomen is soft.     Tenderness:  There is no abdominal tenderness. There is no guarding or rebound.  Musculoskeletal:     Cervical back: Neck supple. No tenderness.     Right lower leg: No edema.     Left lower leg: No edema.  Skin:    General: Skin is warm.     Findings: No rash.  Neurological:     General: No focal deficit present.     Mental Status: He is alert and oriented to person, place, and time.  Psychiatric:        Mood and Affect: Mood  normal.        Behavior: Behavior normal.    Assessment & Plan:   Problem List Items Addressed This Visit       Encounter to establish care    -  Primary Care established Previous chart reviewed History and medications reviewed with the patient  Relevant Orders  CBC  COMPLETE METABOLIC PANEL WITH GFR  Lipid panel  TSH  Hemoglobin A1c  CMP14+EGFR    Cardiovascular and Mediastinum   Coronary artery disease involving native coronary artery of native heart without angina pectoris    On Aspirin and statin On Metoprolol and Lisinopril Had ischemic CM, but LVEF has improved now. Followed by Cardiology      Relevant Orders   Lipid panel   Essential hypertension    BP Readings from Last 1 Encounters:  09/27/20 124/80  Well-controlled with Lisinopril, Chlorthalidone and Metoprolol Counseled for compliance with the medications Advised DASH diet and moderate exercise/walking, at least 150 mins/week      Relevant Orders   TSH     Digestive   Acute diverticulitis    Recent episode, treated with IVF and antibiotics - hospital chart reviewed F/u with GI for colonoscopy      Relevant Orders   CBC     Genitourinary   AKI (acute kidney injury) ([place])    Likely due to acute infection related dehydration Advised to maintain adequate hydration Check CMP        Other   Idiopathic gout    On Allopurinol 300 mg QD No recent flares      Other Visit Diagnoses     Need for immunization against influenza       Relevant Orders   Flu Vaccine QUAD High  Dose(Fluad) (Completed)       Outpatient Encounter Medications as of 09/26/2020  Medication Sig   allopurinol (ZYLOPRIM) 300 MG tablet TAKE 1 TABLET BY MOUTH ONCE DAILY FOR GOUT.   Alpha Lipoic Acid 200 MG CAPS Take 200 mg by mouth daily.    aspirin EC 81 MG tablet Take 81 mg by mouth daily.   chlorthalidone (HYGROTON) 25 MG tablet TAKE 1/2 TABLET BY MOUTH ONCE DAILY.   Coenzyme Q10 (CO Q-10) 100 MG CAPS Take 100 mg by mouth daily.    Green Tea 150 MG CAPS Take 150 mg by mouth daily.    lisinopril (ZESTRIL) 20 MG tablet TAKE ONE TABLET BY MOUTH ONCE DAILY.   metoprolol succinate (TOPROL XL) 25 MG 24 hr tablet Take 1 tablet (25 mg total) by mouth daily.   rosuvastatin (CRESTOR) 40 MG tablet Take 1 tablet (40 mg total) by mouth daily.   sildenafil (VIAGRA) 100 MG tablet TAKE (1) TABLET BY MOUTH AS NEEDED FOR ERECTILE DYSFUNCTION.   tadalafil (CIALIS) 10 MG tablet TAKE ONE TABLET EVERY OTHER DAY AS NEEDED FOR ERECTILE DYSFUNCTION.   Turmeric 500 MG CAPS Take 500 mg by mouth daily.    vitamin B-12 (CYANOCOBALAMIN) 500 MCG tablet Take 1 tablet (500 mcg total) by mouth daily.   vitamin C (ASCORBIC ACID) 500 MG tablet Take 500 mg by mouth daily.   [DISCONTINUED] nitroGLYCERIN (NITROSTAT) 0.4 MG SL tablet PLACE 1 TAB UNDER TONGUE EVERY 5 MIN IF NEEDED FOR CHEST PAIN. MAY USE 3 TIMES.NO RELIEF CALL 911.   [DISCONTINUED] Testosterone 20 % CREA Apply 150 mg topically 2 (two) times daily.   [DISCONTINUED] vardenafil (LEVITRA) 20 MG tablet Take 1 tablet (20 mg total) by mouth daily as needed for erectile dysfunction.  No facility-administered encounter medications on file as of 09/26/2020.    Follow-up: Return in about 4 months (around 01/26/2021) for HTN, CAD.   Lindell Spar, MD

## 2020-09-27 NOTE — Assessment & Plan Note (Signed)
On Allopurinol 300 mg QD No recent flares

## 2020-09-27 NOTE — Assessment & Plan Note (Addendum)
On Aspirin and statin On Metoprolol and Lisinopril Had ischemic CM, but LVEF has improved now. Followed by Cardiology 

## 2020-09-28 LAB — CMP14+EGFR
ALT: 18 IU/L (ref 0–44)
AST: 19 IU/L (ref 0–40)
Albumin/Globulin Ratio: 1.8 (ref 1.2–2.2)
Albumin: 4.5 g/dL (ref 3.7–4.7)
Alkaline Phosphatase: 74 IU/L (ref 44–121)
BUN/Creatinine Ratio: 20 (ref 10–24)
BUN: 24 mg/dL (ref 8–27)
Bilirubin Total: 0.7 mg/dL (ref 0.0–1.2)
CO2: 24 mmol/L (ref 20–29)
Calcium: 9.6 mg/dL (ref 8.6–10.2)
Chloride: 100 mmol/L (ref 96–106)
Creatinine, Ser: 1.22 mg/dL (ref 0.76–1.27)
Globulin, Total: 2.5 g/dL (ref 1.5–4.5)
Glucose: 93 mg/dL (ref 65–99)
Potassium: 4.8 mmol/L (ref 3.5–5.2)
Sodium: 140 mmol/L (ref 134–144)
Total Protein: 7 g/dL (ref 6.0–8.5)
eGFR: 63 mL/min/{1.73_m2} (ref >59)

## 2020-09-28 LAB — LIPID PANEL
Chol/HDL Ratio: 2.5 ratio (ref 0.0–5.0)
Cholesterol, Total: 157 mg/dL (ref 100–199)
HDL: 64 mg/dL (ref >39)
LDL Chol Calc (NIH): 82 mg/dL (ref 0–99)
Triglycerides: 53 mg/dL (ref 0–149)
VLDL Cholesterol Cal: 11 mg/dL (ref 5–40)

## 2020-09-28 LAB — HEMOGLOBIN A1C
Est. average glucose Bld gHb Est-mCnc: 117 mg/dL
Hgb A1c MFr Bld: 5.7 % — ABNORMAL HIGH (ref 4.8–5.6)

## 2020-09-28 LAB — CBC
Hematocrit: 52.6 % — ABNORMAL HIGH (ref 37.5–51.0)
Hemoglobin: 18.6 g/dL — ABNORMAL HIGH (ref 13.0–17.7)
MCH: 33.2 pg — ABNORMAL HIGH (ref 26.6–33.0)
MCHC: 35.4 g/dL (ref 31.5–35.7)
MCV: 94 fL (ref 79–97)
Platelets: 145 10*3/uL — ABNORMAL LOW (ref 150–450)
RBC: 5.6 x10E6/uL (ref 4.14–5.80)
RDW: 13.9 % (ref 11.6–15.4)
WBC: 9.8 10*3/uL (ref 3.4–10.8)

## 2020-09-28 LAB — TSH: TSH: 1.03 u[IU]/mL (ref 0.450–4.500)

## 2020-10-01 ENCOUNTER — Telehealth: Payer: Self-pay | Admitting: Cardiology

## 2020-10-01 MED ORDER — EZETIMIBE 10 MG PO TABS: 10.0000 mg | ORAL_TABLET | Freq: Every day | ORAL | 3 refills | Status: DC

## 2020-10-01 NOTE — Telephone Encounter (Signed)
Please let pt know I reviewed most recent labs and with LDL 82 on Crestor would add zetia - goal LDL  <70,  this is for aortic atherosclerosis as well.  I checked with one of our peripheral arterial doctors and if no lower ext pain with walking, called claudication. Then continue to treat cholesterol and BP.  If increased arterial blockage developed in artery then would to more testing but the sign would be pain in legs.  And ultrasound ruled out aneurysm.

## 2020-10-01 NOTE — Telephone Encounter (Signed)
Pt notified and order placed 

## 2020-10-01 NOTE — Telephone Encounter (Signed)
Called patient with test results. No answer. Left message to call back.  

## 2020-10-04 ENCOUNTER — Other Ambulatory Visit: Payer: Self-pay

## 2020-10-04 ENCOUNTER — Ambulatory Visit (INDEPENDENT_AMBULATORY_CARE_PROVIDER_SITE_OTHER): Payer: Medicare PPO

## 2020-10-04 ENCOUNTER — Telehealth: Payer: Self-pay | Admitting: Internal Medicine

## 2020-10-04 ENCOUNTER — Telehealth: Payer: Self-pay | Admitting: *Deleted

## 2020-10-04 DIAGNOSIS — Z0000 Encounter for general adult medical examination without abnormal findings: Secondary | ICD-10-CM

## 2020-10-04 NOTE — Patient Instructions (Signed)
Health Maintenance, Male Adopting a healthy lifestyle and getting preventive care are important in promoting health and wellness. Ask your health care provider about: The right schedule for you to have regular tests and exams. Things you can do on your own to prevent diseases and keep yourself healthy. What should I know about diet, weight, and exercise? Eat a healthy diet  Eat a diet that includes plenty of vegetables, fruits, low-fat dairy products, and lean protein. Do not eat a lot of foods that are high in solid fats, added sugars, or sodium. Maintain a healthy weight Body mass index (BMI) is a measurement that can be used to identify possible weight problems. It estimates body fat based on height and weight. Your health care provider can help determine your BMI and help you achieve or maintain a healthy weight. Get regular exercise Get regular exercise. This is one of the most important things you can do for your health. Most adults should: Exercise for at least 150 minutes each week. The exercise should increase your heart rate and make you sweat (moderate-intensity exercise). Do strengthening exercises at least twice a week. This is in addition to the moderate-intensity exercise. Spend less time sitting. Even light physical activity can be beneficial. Watch cholesterol and blood lipids Have your blood tested for lipids and cholesterol at 74 years of age, then have this test every 5 years. You may need to have your cholesterol levels checked more often if: Your lipid or cholesterol levels are high. You are older than 74 years of age. You are at high risk for heart disease. What should I know about cancer screening? Many types of cancers can be detected early and may often be prevented. Depending on your health history and family history, you may need to have cancer screening at various ages. This may include screening for: Colorectal cancer. Prostate cancer. Skin cancer. Lung  cancer. What should I know about heart disease, diabetes, and high blood pressure? Blood pressure and heart disease High blood pressure causes heart disease and increases the risk of stroke. This is more likely to develop in people who have high blood pressure readings, are of African descent, or are overweight. Talk with your health care provider about your target blood pressure readings. Have your blood pressure checked: Every 3-5 years if you are 18-39 years of age. Every year if you are 40 years old or older. If you are between the ages of 65 and 75 and are a current or former smoker, ask your health care provider if you should have a one-time screening for abdominal aortic aneurysm (AAA). Diabetes Have regular diabetes screenings. This checks your fasting blood sugar level. Have the screening done: Once every three years after age 45 if you are at a normal weight and have a low risk for diabetes. More often and at a younger age if you are overweight or have a high risk for diabetes. What should I know about preventing infection? Hepatitis B If you have a higher risk for hepatitis B, you should be screened for this virus. Talk with your health care provider to find out if you are at risk for hepatitis B infection. Hepatitis C Blood testing is recommended for: Everyone born from 1945 through 1965. Anyone with known risk factors for hepatitis C. Sexually transmitted infections (STIs) You should be screened each year for STIs, including gonorrhea and chlamydia, if: You are sexually active and are younger than 74 years of age. You are older than 74 years   of age and your health care provider tells you that you are at risk for this type of infection. Your sexual activity has changed since you were last screened, and you are at increased risk for chlamydia or gonorrhea. Ask your health care provider if you are at risk. Ask your health care provider about whether you are at high risk for HIV.  Your health care provider may recommend a prescription medicine to help prevent HIV infection. If you choose to take medicine to prevent HIV, you should first get tested for HIV. You should then be tested every 3 months for as long as you are taking the medicine. Follow these instructions at home: Lifestyle Do not use any products that contain nicotine or tobacco, such as cigarettes, e-cigarettes, and chewing tobacco. If you need help quitting, ask your health care provider. Do not use street drugs. Do not share needles. Ask your health care provider for help if you need support or information about quitting drugs. Alcohol use Do not drink alcohol if your health care provider tells you not to drink. If you drink alcohol: Limit how much you have to 0-2 drinks a day. Be aware of how much alcohol is in your drink. In the [place] one drink equals one 12 oz bottle of beer (355 mL), one 5 oz glass of wine (148 mL), or one 1 oz glass of hard liquor (44 mL). General instructions Schedule regular health, dental, and eye exams. Stay current with your vaccines. Tell your health care provider if: You often feel depressed. You have ever been abused or do not feel safe at home. Summary Adopting a healthy lifestyle and getting preventive care are important in promoting health and wellness. Follow your health care provider's instructions about healthy diet, exercising, and getting tested or screened for diseases. Follow your health care provider's instructions on monitoring your cholesterol and blood pressure. This information is not intended to replace advice given to you by your health care provider. Make sure you discuss any questions you have with your health care provider. Document Revised: 03/15/2020 Document Reviewed: 12/29/2017 Elsevier Patient Education  2022 [employer]  

## 2020-10-04 NOTE — Telephone Encounter (Signed)
Please call patient regarding scheduling procedure. [phone]

## 2020-10-04 NOTE — Telephone Encounter (Signed)
Pt hasn't been seen since 2019. Needs OV if having problems.

## 2020-10-04 NOTE — Progress Notes (Signed)
Subjective:   Austin Bright is a 75 y.o. male who presents for an Initial Medicare Annual Wellness Visit. I connected with  Austin Bright on 10/04/20 by a audio enabled telemedicine application and verified that I am speaking with the correct person using two identifiers.   I discussed the limitations of evaluation and management by telemedicine. The patient expressed understanding and agreed to proceed.  Location of patient:Home  Location of Provider:Office  Persons participating in virtual visit: Austin Bright (patient) and Kenneth Lax Rouge Review of Systems    Defer to PCP       Objective:    There were no vitals filed for this visit. There is no height or weight on file to calculate BMI.  Advanced Directives 10/04/2020 08/18/2020 08/17/2020 04/02/2017 09/15/2011 08/23/2011  Does Patient Have a Medical Advance Directive? Yes Yes No Yes Patient has advance directive, copy not in chart Patient has advance directive, copy not in chart  Type of Advance Directive Healthcare Power of [place];Living will Living will;Healthcare Power of [phone] - Healthcare Power of [place];Living will Living will Living will  Does patient want to make changes to medical advance directive? - No - Patient declined - - - -  Copy of Healthcare Power of Attorney in Chart? - Yes - validated most recent copy scanned in chart (See row information) - Yes Copy requested from other (Comment) Copy requested from family  Would patient like information on creating a medical advance directive? - No - Patient declined No - Patient declined - - -  Pre-existing out of facility DNR order (yellow form or pink MOST form) - - - - - No    Current Medications (verified) Outpatient Encounter Medications as of 10/04/2020  Medication Sig   allopurinol (ZYLOPRIM) 300 MG tablet TAKE 1 TABLET BY MOUTH ONCE DAILY FOR GOUT.   Alpha Lipoic Acid 200 MG CAPS Take 200 mg by mouth daily.    aspirin EC 81 MG tablet Take 81 mg by mouth  daily.   chlorthalidone (HYGROTON) 25 MG tablet TAKE 1/2 TABLET BY MOUTH ONCE DAILY.   Coenzyme Q10 (CO Q-10) 100 MG CAPS Take 100 mg by mouth daily.    ezetimibe (ZETIA) 10 MG tablet Take 1 tablet (10 mg total) by mouth daily.   Green Tea 150 MG CAPS Take 150 mg by mouth daily.    lisinopril (ZESTRIL) 20 MG tablet TAKE ONE TABLET BY MOUTH ONCE DAILY.   metoprolol succinate (TOPROL XL) 25 MG 24 hr tablet Take 1 tablet (25 mg total) by mouth daily.   nitroGLYCERIN (NITROSTAT) 0.4 MG SL tablet PLACE 1 TAB UNDER TONGUE EVERY 5 MIN IF NEEDED FOR CHEST PAIN. MAY USE 3 TIMES.NO RELIEF CALL 911.   rosuvastatin (CRESTOR) 40 MG tablet Take 1 tablet (40 mg total) by mouth daily.   sildenafil (VIAGRA) 100 MG tablet TAKE (1) TABLET BY MOUTH AS NEEDED FOR ERECTILE DYSFUNCTION.   tadalafil (CIALIS) 10 MG tablet TAKE ONE TABLET EVERY OTHER DAY AS NEEDED FOR ERECTILE DYSFUNCTION.   Turmeric 500 MG CAPS Take 500 mg by mouth daily.    vitamin B-12 (CYANOCOBALAMIN) 500 MCG tablet Take 1 tablet (500 mcg total) by mouth daily.   vitamin C (ASCORBIC ACID) 500 MG tablet Take 500 mg by mouth daily.   No facility-administered encounter medications on file as of 10/04/2020.    Allergies (verified) Flonase [fluticasone propionate]   History: Past Medical History:  Diagnosis Date   Acute MI (HCC) 08/2011   Arthritis  Bilateral pneumonia    Diagnosed after STEMI 08/2011   CAD (coronary artery disease)    a. Diagnosed 08/2011 with anterior STEMI due to thrombotic occlusion of mid LAD s/p thrombectomy, PTCA, DES placement 08/23/11.   CHF (congestive heart failure) (HCC)    Dyslipidemia    Gout    Hypertension    Ischemic cardiomyopathy    a. Initial EF 35% by cath 08/23/11, improved to 40-45% by echo 08/25/11.   Shortness of breath    Past Surgical History:  Procedure Laterality Date   carpel tunnel Bilateral 2002   COLONOSCOPY WITH PROPOFOL N/A 04/08/2017   Procedure: COLONOSCOPY WITH PROPOFOL;  Surgeon: Corbin Ade, MD;  [facility];  Service: Endoscopy;  Laterality: N/A;  11:15am   CORONARY STENT PLACEMENT     CYSTECTOMY  1969   pilonidal cyst   LEFT AND RIGHT HEART CATHETERIZATION WITH CORONARY ANGIOGRAM N/A 09/15/2011   Procedure: LEFT AND RIGHT HEART CATHETERIZATION WITH CORONARY ANGIOGRAM;  Surgeon: Herby Abraham, MD;  [facility] LAB;  Service: Cardiovascular;  Laterality: N/A;   LEFT HEART CATH N/A 08/23/2011   Procedure: LEFT HEART CATH;  Surgeon: Iran Ouch, MD;  Location: [facility] CATH LAB;  Service: Cardiovascular;  Laterality: N/A;   NO PAST SURGERIES     PERCUTANEOUS CORONARY STENT INTERVENTION (PCI-S)  08/23/2011   Procedure: PERCUTANEOUS CORONARY STENT INTERVENTION (PCI-S);  Surgeon: Iran Ouch, MD;  Location[facility] CATH LAB;  Service: Cardiovascular;;   Family History  Problem Relation Age of Onset   Hypertension Father    Stroke Mother    Colon cancer Neg Hx    Social History   Socioeconomic History   Marital status: Married    Spouse name: Not on file   Number of children: Not on file   Years of education: Not on file   Highest education level: Not on file  Occupational History   Occupation: Child psychotherapist  Tobacco Use   Smoking status: Former    Packs/day: 1.00    Years: 25.00    Pack years: 25.00    Types: Cigarettes    Quit date: 08/23/1979    Years since quitting: 41.1   Smokeless tobacco: Never  Vaping Use   Vaping Use: Never used  Substance and Sexual Activity   Alcohol use: Yes    Alcohol/week: 4.0 standard drinks    Types: 2 Cans of beer, 2 Shots of liquor per week    Comment: Weekends up to 3 times a week; 2-3 drinks per sitting.   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   Social Determinants of Health   Financial Resource Strain: Low  Risk    Difficulty of Paying Living Expenses: Not hard at all  Food Insecurity: No Food Insecurity   Worried About Programme researcher, broadcasting/film/video in the Last Year: Never true   Ran Out of Food in the Last Year: Never true  Transportation Needs: No Transportation Needs   Lack of Transportation (Medical): No   Lack of Transportation (Non-Medical): No  Physical Activity: Insufficiently Active   Days of Exercise per Week: 6 days   Minutes of Exercise per Session: 20 min  Stress: No Stress Concern Present   Feeling of Stress : Not at all  Social Connections: Moderately Integrated  Frequency of Communication with Friends and Family: More than three times a week   Frequency of Social Gatherings with Friends and Family: More than three times a week   Attends Religious Services: Never   Database administrator or Organizations: Yes   Attends Engineer, structural: 1 to 4 times per year   Marital Status: Married    Tobacco Counseling Counseling given: Not Answered   Clinical Intake:  Pre-visit preparation completed: Yes  Pain : No/denies pain     Diabetes: No  How often do you need to have someone help you when you read instructions, pamphlets, or other written materials from your doctor or pharmacy?: 1 - Never What is the last grade level you completed in school?: COLLEGE  Diabetic?No  Interpreter Needed?: No  Information entered by :: Clyde Lundborg   Activities of Daily Living In your present state of health, do you have any difficulty performing the following activities: 10/04/2020 08/18/2020  Hearing? N N  Vision? N N  Difficulty concentrating or making decisions? N N  Walking or climbing stairs? N N  Dressing or bathing? N N  Doing errands, shopping? N N  Preparing Food and eating ? N -  Using the Toilet? N -  In the past six months, have you accidently leaked urine? N -  Do you have problems with loss of bowel control? N -  Managing your Medications? N -  Managing  your Finances? N -  Housekeeping or managing your Housekeeping? N -  Some recent data might be hidden    Patient Care Team: Anabel Halon, MD as PCP - General (Internal Medicine) Wyline Mood Dorothe Pea, MD as PCP - Cardiology (Cardiology) Daleen Squibb, Jesse Sans, MD (Inactive) as Attending Physician (Cardiology) Corbin Ade, MD as Consulting Physician (Gastroenterology)  Indicate any recent Medical Services you may have received from other than [facility] providers in the past year (date may be approximate).     Assessment:   This is a routine wellness examination for [place].  Hearing/Vision screen No results found.  Dietary issues and exercise activities discussed: Current Exercise Habits: Home exercise routine, Type of exercise: walking, Time (Minutes): 20, Frequency (Times/Week): 6, Weekly Exercise (Minutes/Week): 120, Intensity: Mild   Goals Addressed   None    Depression Screen PHQ 2/9 Scores 10/04/2020 09/26/2020 12/11/2019 05/17/2019 09/24/2011  PHQ - 2 Score 0 0 0 0 0  PHQ- 9 Score - - 0 - -    Fall Risk Fall Risk  10/04/2020 09/26/2020 09/17/2020 12/11/2019 05/17/2019  Falls in the past year? 0 0 0 0 0  Number falls in past yr: 0 0 - - 0  Injury with Fall? 0 0 - - 0  Risk for fall due to : No Fall Risks No Fall Risks - - Other (Comment)  Follow up Falls evaluation completed Falls evaluation completed Falls evaluation completed - -    FALL RISK PREVENTION PERTAINING TO THE HOME:  Any stairs in or around the home? Yes  If so, are there any without handrails? No  Home free of loose throw rugs in walkways, pet beds, electrical cords, etc? Yes  Adequate lighting in your home to reduce risk of falls? Yes   ASSISTIVE DEVICES UTILIZED TO PREVENT FALLS:  Life alert? No  Use of a cane, walker or w/c? No  Grab bars in the bathroom? Yes  Shower chair or bench in shower? No  Elevated toilet seat or a handicapped toilet? No   TIMED  UP AND GO:  Was the test performed?  N/A .  Length of  time to ambulate 10 feet: N/A sec.     Cognitive Function:     6CIT Screen 10/04/2020  What Year? 0 points  What month? 0 points  What time? 0 points  Count back from 20 0 points  Months in reverse 0 points  Repeat phrase 0 points  Total Score 0    Immunizations Immunization History  Administered Date(s) Administered   Fluad Quad(high Dose 65+) 12/11/2019, 09/26/2020   Influenza, High Dose Seasonal PF 09/22/2018   Influenza-Unspecified 10/19/2017   Moderna Sars-Covid-2 Vaccination 03/17/2019, 04/14/2019, 11/16/2019, 08/11/2020   Pneumococcal Conjugate-13 10/31/2013   Pneumococcal Polysaccharide-23 10/21/2011, 10/11/2017   Tdap 09/21/2012   Zoster, Live 07/28/2013    TDAP status: Up to date  Flu Vaccine status: Up to date    Covid-19 vaccine status: Completed vaccines  Qualifies for Shingles Vaccine? Yes   Zostavax completed No   Shingrix Completed?: No.    Education has been provided regarding the importance of this vaccine. Patient has been advised to call insurance company to determine out of pocket expense if they have not yet received this vaccine. Advised may also receive vaccine at local pharmacy or Health Dept. Verbalized acceptance and understanding.  Screening Tests Health Maintenance  Topic Date Due   Zoster Vaccines- Shingrix (1 of 2) Never done   TETANUS/TDAP  09/22/2022   COLONOSCOPY (Pts 45-47yrs Insurance coverage will need to be confirmed)  04/09/2027   INFLUENZA VACCINE  Completed   COVID-19 Vaccine  Completed   Hepatitis C Screening  Completed   HPV VACCINES  Aged Out    Health Maintenance  Health Maintenance Due  Topic Date Due   Zoster Vaccines- Shingrix (1 of 2) Never done    Colorectal cancer screening: Type of screening: Colonoscopy. Completed 04/08/2017. Repeat every 10 years  Lung Cancer Screening: (Low Dose CT Chest recommended if Age 60-80 years, 30 pack-year currently smoking OR have quit w/in 15years.) does not qualify.    Lung Cancer Screening Referral: NO  Additional Screening:  Hepatitis C Screening: does qualify; Completed 08/18/2020  Vision Screening: Recommended annual ophthalmology exams for early detection of glaucoma and other disorders of the eye. Is the patient up to date with their annual eye exam?  Yes  Who is the provider or what is the name of the office in which the patient attends annual eye exams? [facility] If pt is not established with a provider, would they like to be referred to a provider to establish care? No .   Dental Screening: Recommended annual dental exams for proper oral hygiene  Community Resource Referral / Chronic Care Management: CRR required this visit?  No   CCM required this visit?  No      Plan:     I have personally reviewed and noted the following in the patient's chart:   Medical and social history Use of alcohol, tobacco or illicit drugs  Current medications and supplements including opioid prescriptions. Patient is not currently taking opioid prescriptions. Functional ability and status Nutritional status Physical activity Advanced directives List of other physicians Hospitalizations, surgeries, and ER visits in previous 12 months Vitals Screenings to include cognitive, depression, and falls Referrals and appointments  In addition, I have reviewed and discussed with patient certain preventive protocols, quality metrics, and best practice recommendations. A written personalized care plan for preventive services as well as general preventive health recommendations were provided to patient.  Victorio Palm, [facility]   10/04/2020   Nurse Notes: Non Face to Face 30 minute visit   Mr. Zenda Alpers , Thank you for taking time to come for your Medicare Wellness Visit. I appreciate your ongoing commitment to your health goals. Please review the following plan we discussed and let me know if I can assist you in the future.   These are the goals we  discussed:  Goals   None     This is a list of the screening recommended for you and due dates:  Health Maintenance  Topic Date Due   Zoster (Shingles) Vaccine (1 of 2) Never done   Tetanus Vaccine  09/22/2022   Colon Cancer Screening  04/09/2027   Flu Shot  Completed   COVID-19 Vaccine  Completed   Hepatitis C Screening: USPSTF Recommendation to screen - Ages 18-79 yo.  Completed   HPV Vaccine  Aged Out

## 2020-10-04 NOTE — Telephone Encounter (Signed)
Does pt need OV or NV? He said whatever problems he had in the past have resolved.

## 2020-10-04 NOTE — Telephone Encounter (Signed)
Pt does not need repeat colonoscopy per last procedure note.  If he is having problems then he would need an ov.

## 2020-10-08 NOTE — Telephone Encounter (Signed)
Pt said his PCP recommended another colonoscopy since he had a diverticulitis flare. I told him to have his PCP send [place] a referral.

## 2020-10-08 NOTE — Telephone Encounter (Signed)
Please schedule OV.  

## 2020-10-08 NOTE — Telephone Encounter (Signed)
Pt said his PCP recommended him having another colonoscopy due to his diverticulitis flare he had. I told him to have his PCP send [place] a referral.

## 2020-10-09 ENCOUNTER — Encounter: Payer: Self-pay | Admitting: Internal Medicine

## 2020-10-10 ENCOUNTER — Other Ambulatory Visit: Payer: Self-pay | Admitting: Internal Medicine

## 2020-10-10 ENCOUNTER — Encounter: Payer: Self-pay | Admitting: Internal Medicine

## 2020-10-10 DIAGNOSIS — K5792 Diverticulitis of intestine, part unspecified, without perforation or abscess without bleeding: Secondary | ICD-10-CM

## 2020-10-24 ENCOUNTER — Other Ambulatory Visit: Payer: Self-pay | Admitting: *Deleted

## 2020-10-24 ENCOUNTER — Encounter: Payer: Self-pay | Admitting: Internal Medicine

## 2020-10-24 MED ORDER — ALLOPURINOL 300 MG PO TABS: ORAL_TABLET | ORAL | 0 refills | Status: DC

## 2020-11-15 ENCOUNTER — Other Ambulatory Visit: Payer: Self-pay | Admitting: Student

## 2020-11-18 ENCOUNTER — Other Ambulatory Visit (INDEPENDENT_AMBULATORY_CARE_PROVIDER_SITE_OTHER): Payer: Self-pay | Admitting: Nurse Practitioner

## 2020-11-18 ENCOUNTER — Other Ambulatory Visit: Payer: Self-pay | Admitting: Cardiology

## 2020-11-21 ENCOUNTER — Other Ambulatory Visit: Payer: Self-pay | Admitting: *Deleted

## 2020-11-21 MED ORDER — LISINOPRIL 20 MG PO TABS: 20.0000 mg | ORAL_TABLET | Freq: Every day | ORAL | 0 refills | Status: DC

## 2020-12-11 ENCOUNTER — Encounter (INDEPENDENT_AMBULATORY_CARE_PROVIDER_SITE_OTHER): Payer: Medicare PPO | Admitting: Internal Medicine

## 2021-01-15 ENCOUNTER — Other Ambulatory Visit (INDEPENDENT_AMBULATORY_CARE_PROVIDER_SITE_OTHER): Payer: Self-pay | Admitting: Internal Medicine

## 2021-01-16 ENCOUNTER — Encounter: Payer: Self-pay | Admitting: Internal Medicine

## 2021-01-20 ENCOUNTER — Encounter: Payer: Self-pay | Admitting: Internal Medicine

## 2021-01-21 ENCOUNTER — Other Ambulatory Visit: Payer: Self-pay

## 2021-01-21 ENCOUNTER — Encounter: Payer: Self-pay | Admitting: Internal Medicine

## 2021-01-21 ENCOUNTER — Ambulatory Visit (INDEPENDENT_AMBULATORY_CARE_PROVIDER_SITE_OTHER): Payer: Medicare PPO | Admitting: Internal Medicine

## 2021-01-21 DIAGNOSIS — H109 Unspecified conjunctivitis: Secondary | ICD-10-CM

## 2021-01-21 MED ORDER — OFLOXACIN 0.3 % OP SOLN: 1.0000 [drp] | Freq: Four times a day (QID) | OPHTHALMIC | 0 refills | Status: DC

## 2021-01-21 NOTE — Progress Notes (Signed)
Virtual Visit via Telephone Note   This visit type was conducted due to national recommendations for restrictions regarding the COVID-19 Pandemic (e.g. social distancing) in an effort to limit this patient's exposure and mitigate transmission in our community.  Due to his co-morbid illnesses, this patient is at least at moderate risk for complications without adequate follow up.  This format is felt to be most appropriate for this patient at this time.  The patient did not have access to video technology/had technical difficulties with video requiring transitioning to audio format only (telephone).  All issues noted in this document were discussed and addressed.  No physical exam could be performed with this format.  Evaluation Performed:  Follow-up visit  Date:  01/21/2021   ID:  Austin Bright, DOB [id number], MRN [id number]  Patient Location: Home Provider Location: Office/Clinic  Participants: Patient Location of Patient: Home Location of Provider: Telehealth Consent was obtain for visit to be over via telehealth. I verified that I am speaking with the correct person using two identifiers.  PCP:  Anabel HalonPatel, Robertine Kipper K, MD   Chief Complaint: Left eye pain  History of Present Illness:    Austin Bright is a 76 y.o. male who has a televisit for complaint of left eye pain and irritation since yesterday.  He reports that he noted left lower eyelid irritation about 2 days ago, which later progressed to eye discharge, pain and redness yesterday.  He has glue-like discharge from the left eye.  Denies any swelling around the eyes or double vision currently.  The patient does not have symptoms concerning for COVID-19 infection (fever, chills, cough, or new shortness of breath).   Past Medical, Surgical, Social History, Allergies, and Medications have been Reviewed.  Past Medical History:  Diagnosis Date   Acute MI (HCC) 08/2011   Arthritis    Bilateral pneumonia    Diagnosed after  STEMI 08/2011   CAD (coronary artery disease)    a. Diagnosed 08/2011 with anterior STEMI due to thrombotic occlusion of mid LAD s/p thrombectomy, PTCA, DES placement 08/23/11.   CHF (congestive heart failure) (HCC)    Dyslipidemia    Gout    Hypertension    Ischemic cardiomyopathy    a. Initial EF 35% by cath 08/23/11, improved to 40-45% by echo 08/25/11.   Shortness of breath    Past Surgical History:  Procedure Laterality Date   carpel tunnel Bilateral 2002   COLONOSCOPY WITH PROPOFOL N/A 04/08/2017   Procedure: COLONOSCOPY WITH PROPOFOL;  Surgeon: Corbin Adeourk, Robert M, MD;  [facility];  Service: Endoscopy;  Laterality: N/A;  11:15am   CORONARY STENT PLACEMENT     CYSTECTOMY  1969   pilonidal cyst   LEFT AND RIGHT HEART CATHETERIZATION WITH CORONARY ANGIOGRAM N/A 09/15/2011   Procedure: LEFT AND RIGHT HEART CATHETERIZATION WITH CORONARY ANGIOGRAM;  Surgeon: Herby Abrahamhomas D Stuckey, MD;  [facility];  Service: Cardiovascular;  Laterality: N/A;   LEFT HEART CATH N/A 08/23/2011   Procedure: LEFT HEART CATH;  Surgeon: Iran OuchMuhammad A Arida, MD;  Location: [facility] CATH LAB;  Service: Cardiovascular;  Laterality: N/A;   NO PAST SURGERIES     PERCUTANEOUS CORONARY STENT INTERVENTION (PCI-S)  08/23/2011   Procedure: PERCUTANEOUS CORONARY STENT INTERVENTION (PCI-S);  Surgeon: Iran OuchMuhammad A Arida, MD;  [facility];  Service: Cardiovascular;;     Current Meds  Medication Sig   allopurinol (ZYLOPRIM) 300 MG tablet TAKE 1 TABLET BY MOUTH ONCE DAILY  FOR GOUT.   Alpha Lipoic Acid 200 MG CAPS Take 200 mg by mouth daily.    aspirin EC 81 MG tablet Take 81 mg by mouth daily.   chlorthalidone (HYGROTON) 25 MG tablet TAKE 1/2 TABLET BY MOUTH ONCE DAILY.   Coenzyme Q10 (CO Q-10) 100 MG CAPS Take 100 mg by mouth daily.    Green Tea 150 MG CAPS Take 150 mg by mouth daily.    lisinopril (ZESTRIL) 20 MG tablet Take 1 tablet (20 mg total) by mouth daily.   metoprolol succinate (TOPROL-XL) 25 MG 24 hr tablet  TAKE 1 TABLET BY MOUTH ONCE DAILY.   nitroGLYCERIN (NITROSTAT) 0.4 MG SL tablet PLACE 1 TAB UNDER TONGUE EVERY 5 MIN IF NEEDED FOR CHEST PAIN. MAY USE 3 TIMES.NO RELIEF CALL 911.   ofloxacin (OCUFLOX) 0.3 % ophthalmic solution Place 1 drop into the left eye 4 (four) times daily.   rosuvastatin (CRESTOR) 40 MG tablet TAKE (1) TABLET BY MOUTH ONCE A DAY.   sildenafil (VIAGRA) 100 MG tablet TAKE (1) TABLET BY MOUTH AS NEEDED FOR ERECTILE DYSFUNCTION.   tadalafil (CIALIS) 10 MG tablet TAKE ONE TABLET EVERY OTHER DAY AS NEEDED FOR ERECTILE DYSFUNCTION.   Turmeric 500 MG CAPS Take 500 mg by mouth daily.    vitamin B-12 (CYANOCOBALAMIN) 500 MCG tablet Take 1 tablet (500 mcg total) by mouth daily.   vitamin C (ASCORBIC ACID) 500 MG tablet Take 500 mg by mouth daily.     Allergies:   Flonase [fluticasone propionate]   ROS:   Please see the history of present illness.     All other systems reviewed and are negative.   Labs/Other Tests and Data Reviewed:    Recent Labs: 08/18/2020: Magnesium 1.9 09/27/2020: ALT 18; BUN 24; Creatinine, Ser 1.22; Hemoglobin 18.6; Platelets 145; Potassium 4.8; Sodium 140; TSH 1.030   Recent Lipid Panel Lab Results  Component Value Date/Time   CHOL 157 09/27/2020 08:31 AM   TRIG 53 09/27/2020 08:31 AM   HDL 64 09/27/2020 08:31 AM   CHOLHDL 2.5 09/27/2020 08:31 AM   CHOLHDL 2.7 10/21/2018 08:36 AM   LDLCALC 82 09/27/2020 08:31 AM    Wt Readings from Last 3 Encounters:  09/27/20 201 lb 9.6 oz (91.4 kg)  09/26/20 202 lb (91.6 kg)  09/17/20 202 lb (91.6 kg)     ASSESSMENT & PLAN:    Bacterial conjunctivitis of left eye He has shared image through MyChart -reviewed Started ofloxacin eyedrop Warm compresses to help with eyelid swelling if any Advised to get immediate medical attention if he has any changes in his vision such as double vision  Time:   Today, I have spent 9 minutes reviewing the chart, including problem list, medications, and with the  patient with telehealth technology discussing the above problems.   Medication Adjustments/Labs and Tests Ordered: Current medicines are reviewed at length with the patient today.  Concerns regarding medicines are outlined above.   Tests Ordered: No orders of the defined types were placed in this encounter.   Medication Changes: Meds ordered this encounter  Medications   ofloxacin (OCUFLOX) 0.3 % ophthalmic solution    Sig: Place 1 drop into the left eye 4 (four) times daily.    Dispense:  5 mL    Refill:  0     Note: This dictation was prepared with Dragon dictation along with smaller phrase technology. Similar sounding words can be transcribed inadequately or may not be corrected upon review. Any transcriptional errors that result  from this process are unintentional.      Disposition:  Follow up  Signed, Anabel Halon, MD  01/21/2021 3:45 PM     [name]

## 2021-01-23 ENCOUNTER — Other Ambulatory Visit: Payer: Self-pay | Admitting: Internal Medicine

## 2021-01-27 ENCOUNTER — Ambulatory Visit (INDEPENDENT_AMBULATORY_CARE_PROVIDER_SITE_OTHER): Payer: Medicare PPO | Admitting: Internal Medicine

## 2021-01-27 ENCOUNTER — Encounter: Payer: Self-pay | Admitting: Internal Medicine

## 2021-01-27 ENCOUNTER — Other Ambulatory Visit: Payer: Self-pay

## 2021-01-27 VITALS — BP 136/84 | HR 59 | Resp 18 | Ht 70.0 in | Wt 209.1 lb

## 2021-01-27 DIAGNOSIS — M1000 Idiopathic gout, unspecified site: Secondary | ICD-10-CM

## 2021-01-27 DIAGNOSIS — Z0001 Encounter for general adult medical examination with abnormal findings: Secondary | ICD-10-CM

## 2021-01-27 DIAGNOSIS — Z0000 Encounter for general adult medical examination without abnormal findings: Secondary | ICD-10-CM

## 2021-01-27 DIAGNOSIS — F5101 Primary insomnia: Secondary | ICD-10-CM

## 2021-01-27 DIAGNOSIS — Z125 Encounter for screening for malignant neoplasm of prostate: Secondary | ICD-10-CM | POA: Diagnosis not present

## 2021-01-27 DIAGNOSIS — I2510 Atherosclerotic heart disease of native coronary artery without angina pectoris: Secondary | ICD-10-CM

## 2021-01-27 DIAGNOSIS — I10 Essential (primary) hypertension: Secondary | ICD-10-CM

## 2021-01-27 MED ORDER — TRAZODONE HCL 50 MG PO TABS: 25.0000 mg | ORAL_TABLET | Freq: Every evening | ORAL | 3 refills | Status: DC | PRN

## 2021-01-27 NOTE — Patient Instructions (Signed)
Please start taking Trazodone as needed for insomnia.  Please maintain simple sleep hygiene. - Maintain dark and non-noisy environment in the bedroom. - Please use the bedroom for sleep and sexual activity only. - Do not use electronic devices in the bedroom. - Please take dinner at least 2 hours before bedtime. - Please avoid caffeinated products in the evening, including coffee, soft drinks. - Please try to maintain the regular sleep-wake cycle - Go to bed and wake up at the same time.  Please continue to take other medications as prescribed.  Please get fasting blood tests done before the next visit.  Please consider getting Shingrix vaccines at local pharmacy.

## 2021-01-28 DIAGNOSIS — L821 Other seborrheic keratosis: Secondary | ICD-10-CM | POA: Diagnosis not present

## 2021-01-28 DIAGNOSIS — L700 Acne vulgaris: Secondary | ICD-10-CM | POA: Diagnosis not present

## 2021-01-28 DIAGNOSIS — L280 Lichen simplex chronicus: Secondary | ICD-10-CM | POA: Diagnosis not present

## 2021-01-30 DIAGNOSIS — Z125 Encounter for screening for malignant neoplasm of prostate: Secondary | ICD-10-CM | POA: Insufficient documentation

## 2021-01-30 NOTE — Progress Notes (Signed)
Established Patient Office Visit  Subjective:  Patient ID: Austin Bright, male    DOB: Apr 14, 1945  Age: 76 y.o. MRN: [id number]  CC:  Chief Complaint  Patient presents with   Follow-up    4 month follow up HTN CAD had covid around christmas and then pink eye but is doing better     HPI Austin Bright is a 76 y.o. male with past medical history of CAD s/p stent placement, HTN, HLD, gout and diverticulitis who presents for f/u of his chronic medical conditions.  HTN and CAD: BP is well-controlled. Takes medications regularly. Patient denies headache, dizziness, chest pain, dyspnea or palpitations.  He follows up with cardiology.  He has a history of stent placement, and takes aspirin and statin.  He also takes metoprolol and lisinopril.  He had COVID infection about 2 weeks ago, but he feels better now. Denies any cough, dyspnea or wheezing. He had bacterial conjunctivitis, but his eye redness and itching have improved with Ofloxacin eye drop.  He c/o chronic insomnia, especially difficulty maintaining sleep. He has tried Melatonin with no relief. He denies any anhedonia, anxiety or daytime fatigue.  Past Medical History:  Diagnosis Date   Acute MI ([place]) 08/2011   Arthritis    Bilateral pneumonia    Diagnosed after STEMI 08/2011   CAD (coronary artery disease)    a. Diagnosed 08/2011 with anterior STEMI due to thrombotic occlusion of mid LAD s/p thrombectomy, PTCA, DES placement 08/23/11.   CHF (congestive heart failure) (HCC)    Dyslipidemia    Gout    Hypertension    Ischemic cardiomyopathy    a. Initial EF 35% by cath 08/23/11, improved to 40-45% by echo 08/25/11.   Shortness of breath     Past Surgical History:  Procedure Laterality Date   carpel tunnel Bilateral 2002   COLONOSCOPY WITH PROPOFOL N/A 04/08/2017   Procedure: COLONOSCOPY WITH PROPOFOL;  Surgeon: Daneil Dolin, MD;  Location: [facility];  Service: Endoscopy;  Laterality: N/A;  11:15am   CORONARY STENT  PLACEMENT     CYSTECTOMY  1969   pilonidal cyst   LEFT AND RIGHT HEART CATHETERIZATION WITH CORONARY ANGIOGRAM N/A 09/15/2011   Procedure: LEFT AND RIGHT HEART CATHETERIZATION WITH CORONARY ANGIOGRAM;  Surgeon: Hillary Bow, MD;  Location: [facility] CATH LAB;  Service: Cardiovascular;  Laterality: N/A;   LEFT HEART CATH N/A 08/23/2011   Procedure: LEFT HEART CATH;  Surgeon: Wellington Hampshire, MD;  Location: [facility] CATH LAB;  Service: Cardiovascular;  Laterality: N/A;   NO PAST SURGERIES     PERCUTANEOUS CORONARY STENT INTERVENTION (PCI-S)  08/23/2011   Procedure: PERCUTANEOUS CORONARY STENT INTERVENTION (PCI-S);  Surgeon: Wellington Hampshire, MD;  Location: [facility] CATH LAB;  Service: Cardiovascular;;    Family History  Problem Relation Age of Onset   Hypertension Father    Stroke Mother    Colon cancer Neg Hx     Social History   Socioeconomic History   Marital status: Married    Spouse name: Not on file   Number of children: Not on file   Years of education: Not on file   Highest education level: Not on file  Occupational History   Occupation: Comptroller  Tobacco Use   Smoking status: Former    Packs/day: 1.00    Years: 25.00    Pack years: 25.00    Types: Cigarettes    Quit date: 08/23/1979    Years since quitting: 8.4  Smokeless tobacco: Never  Vaping Use   Vaping Use: Never used  Substance and Sexual Activity   Alcohol use: Yes    Alcohol/week: 4.0 standard drinks    Types: 2 Cans of beer, 2 Shots of liquor per week    Comment: Weekends up to 3 times a week; 2-3 drinks per sitting.   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.Has a girlfriend.Ex-[employer].   Social Determinants of Health   Financial Resource Strain: Low Risk    Difficulty of Paying Living Expenses: Not hard at all  Food Insecurity: No Food Insecurity    Worried About Charity fundraiser in the Last Year: Never true   [place] in the Last Year: Never true  Transportation Needs: No Transportation Needs   Lack of Transportation (Medical): No   Lack of Transportation (Non-Medical): No  Physical Activity: Insufficiently Active   Days of Exercise per Week: 6 days   Minutes of Exercise per Session: 20 min  Stress: No Stress Concern Present   Feeling of Stress : Not at all  Social Connections: Moderately Integrated   Frequency of Communication with Friends and Family: More than three times a week   Frequency of Social Gatherings with Friends and Family: More than three times a week   Attends Religious Services: Never   Marine scientist or Organizations: Yes   Attends Music therapist: 1 to 4 times per year   Marital Status: Married  Human resources officer Violence: Not At Risk   Fear of Current or Ex-Partner: No   Emotionally Abused: No   Physically Abused: No   Sexually Abused: No    Outpatient Medications Prior to Visit  Medication Sig Dispense Refill   allopurinol (ZYLOPRIM) 300 MG tablet TAKE 1 TABLET BY MOUTH ONCE DAILY FOR GOUT. 90 tablet 0   Alpha Lipoic Acid 200 MG CAPS Take 200 mg by mouth daily.      aspirin EC 81 MG tablet Take 81 mg by mouth daily.     chlorthalidone (HYGROTON) 25 MG tablet TAKE 1/2 TABLET BY MOUTH ONCE DAILY. 45 tablet 3   Coenzyme Q10 (CO Q-10) 100 MG CAPS Take 100 mg by mouth daily.      Green Tea 150 MG CAPS Take 150 mg by mouth daily.      lisinopril (ZESTRIL) 20 MG tablet Take 1 tablet (20 mg total) by mouth daily. 90 tablet 0   metoprolol succinate (TOPROL-XL) 25 MG 24 hr tablet TAKE 1 TABLET BY MOUTH ONCE DAILY. 90 tablet 3   nitroGLYCERIN (NITROSTAT) 0.4 MG SL tablet PLACE 1 TAB UNDER TONGUE EVERY 5 MIN IF NEEDED FOR CHEST PAIN. MAY USE 3 TIMES.NO RELIEF CALL 911. 25 tablet 3   ofloxacin (OCUFLOX) 0.3 % ophthalmic solution Place 1 drop into the left eye 4 (four) times daily. 5  mL 0   rosuvastatin (CRESTOR) 40 MG tablet TAKE (1) TABLET BY MOUTH ONCE A DAY. 90 tablet 0   sildenafil (VIAGRA) 100 MG tablet TAKE (1) TABLET BY MOUTH AS NEEDED FOR ERECTILE DYSFUNCTION. 30 tablet 0   Turmeric 500 MG CAPS Take 500 mg by mouth daily.      vitamin B-12 (CYANOCOBALAMIN) 500 MCG tablet Take 1 tablet (500 mcg total) by mouth daily.     vitamin C (ASCORBIC ACID) 500  MG tablet Take 500 mg by mouth daily.     ezetimibe (ZETIA) 10 MG tablet Take 1 tablet (10 mg total) by mouth daily. 90 tablet 3   tadalafil (CIALIS) 10 MG tablet TAKE ONE TABLET EVERY OTHER DAY AS NEEDED FOR ERECTILE DYSFUNCTION. 20 tablet 3   No facility-administered medications prior to visit.    Allergies  Allergen Reactions   Flonase [Fluticasone Propionate] Other (See Comments)    Nose bleeds.    ROS Review of Systems  Constitutional:  Negative for chills and fever.  HENT:  Negative for congestion and sore throat.   Eyes:  Negative for pain and discharge.  Respiratory:  Negative for cough and shortness of breath.   Cardiovascular:  Negative for chest pain and palpitations.  Gastrointestinal:  Negative for constipation, diarrhea, nausea and vomiting.  Endocrine: Negative for polydipsia and polyuria.  Genitourinary:  Negative for dysuria and hematuria.  Musculoskeletal:  Negative for neck pain and neck stiffness.  Skin:  Negative for rash.  Neurological:  Negative for dizziness, weakness, numbness and headaches.  Psychiatric/Behavioral:  Negative for agitation and behavioral problems.      Objective:    Physical Exam Vitals reviewed.  Constitutional:      General: He is not in acute distress.    Appearance: He is not diaphoretic.  HENT:     Head: Normocephalic and atraumatic.     Nose: Nose normal.     Mouth/Throat:     Mouth: Mucous membranes are moist.  Eyes:     General: No scleral icterus.    Extraocular Movements: Extraocular movements intact.  Cardiovascular:     Rate and Rhythm:  Normal rate and regular rhythm.     Pulses: Normal pulses.     Heart sounds: Normal heart sounds. No murmur heard. Pulmonary:     Breath sounds: Normal breath sounds. No wheezing or rales.  Musculoskeletal:     Cervical back: Neck supple. No tenderness.     Right lower leg: No edema.     Left lower leg: No edema.  Skin:    General: Skin is warm.     Findings: No rash.  Neurological:     General: No focal deficit present.     Mental Status: He is alert and oriented to person, place, and time.  Psychiatric:        Mood and Affect: Mood normal.        Behavior: Behavior normal.    BP 136/84 (BP Location: Left Arm, Patient Position: Sitting, Cuff Size: Normal)    Pulse (!) 59    Resp 18    Ht _0  (1.778 m)    Wt 209 lb 1.9 oz (94.9 kg)    SpO2 100%    BMI 30.01 kg/m  Wt Readings from Last 3 Encounters:  01/27/21 209 lb 1.9 oz (94.9 kg)  09/27/20 201 lb 9.6 oz (91.4 kg)  09/26/20 202 lb (91.6 kg)    Lab Results  Component Value Date   TSH 1.030 09/27/2020   Lab Results  Component Value Date   WBC 9.8 09/27/2020   HGB 18.6 (H) 09/27/2020   HCT 52.6 (H) 09/27/2020   MCV 94 09/27/2020   PLT 145 (L) 09/27/2020   Lab Results  Component Value Date   NA 140 09/27/2020   K 4.8 09/27/2020   CO2 24 09/27/2020   GLUCOSE 93 09/27/2020   BUN 24 09/27/2020   CREATININE 1.22 09/27/2020   BILITOT 0.7 09/27/2020   ALKPHOS  74 09/27/2020   AST 19 09/27/2020   ALT 18 09/27/2020   PROT 7.0 09/27/2020   ALBUMIN 4.5 09/27/2020   CALCIUM 9.6 09/27/2020   ANIONGAP 11 08/19/2020   EGFR 63 09/27/2020   Lab Results  Component Value Date   CHOL 157 09/27/2020   Lab Results  Component Value Date   HDL 64 09/27/2020   Lab Results  Component Value Date   LDLCALC 82 09/27/2020   Lab Results  Component Value Date   TRIG 53 09/27/2020   Lab Results  Component Value Date   CHOLHDL 2.5 09/27/2020   Lab Results  Component Value Date   HGBA1C 5.7 (H) 09/27/2020       Assessment & Plan:   Problem List Items Addressed This Visit       Cardiovascular and Mediastinum   Coronary artery disease involving native coronary artery of native heart without angina pectoris - Primary    On Aspirin and statin On Metoprolol and Lisinopril Had ischemic CM, but LVEF has improved now. Followed by Cardiology      Relevant Orders   Lipid Profile   Essential hypertension    BP Readings from Last 1 Encounters:  01/27/21 136/84  Well-controlled with Lisinopril, Chlorthalidone and Metoprolol Counseled for compliance with the medications Advised DASH diet and moderate exercise/walking, at least 150 mins/week        Other   Idiopathic gout    On Allopurinol 300 mg QD No recent flares Check uric acid level      Relevant Orders   Uric acid   Primary insomnia    Has tried Melatonin and ZQuil with no relief Sleep hygiene discussed Started Trazodone 25-50 mg qHS PRN      Relevant Medications   traZODone (DESYREL) 50 MG tablet   Prostate cancer screening    Ordered PSA after discussing its limitations for prostate cancer screening, including false positive results leading additional investigations.      Relevant Orders   PSA   Meds ordered this encounter  Medications   traZODone (DESYREL) 50 MG tablet    Sig: Take 0.5-1 tablets (25-50 mg total) by mouth at bedtime as needed for sleep.    Dispense:  30 tablet    Refill:  3    Follow-up: Return in about 4 months (around 05/27/2021) for Annual physical.    Lindell Spar, MD

## 2021-01-30 NOTE — Assessment & Plan Note (Addendum)
On Allopurinol 300 mg QD No recent flares Check uric acid level

## 2021-01-30 NOTE — Assessment & Plan Note (Signed)
Ordered PSA after discussing its limitations for prostate cancer screening, including false positive results leading additional investigations. 

## 2021-01-30 NOTE — Assessment & Plan Note (Signed)
BP Readings from Last 1 Encounters:  01/27/21 136/84   Well-controlled with Lisinopril, Chlorthalidone and Metoprolol Counseled for compliance with the medications Advised DASH diet and moderate exercise/walking, at least 150 mins/week

## 2021-01-30 NOTE — Assessment & Plan Note (Signed)
On Aspirin and statin On Metoprolol and Lisinopril Had ischemic CM, but LVEF has improved now. Followed by Cardiology 

## 2021-01-30 NOTE — Assessment & Plan Note (Signed)
Has tried Melatonin and ZQuil with no relief Sleep hygiene discussed Started Trazodone 25-50 mg qHS PRN

## 2021-02-17 ENCOUNTER — Other Ambulatory Visit: Payer: Self-pay | Admitting: Internal Medicine

## 2021-02-22 NOTE — H&P (View-Only) (Signed)
° ° °Primary Care Physician:  Patel, Rutwik K, MD °Primary Gastroenterologist:  Dr. Rourk ° °Chief Complaint  °Patient presents with  ° Diverticulitis  °  Admitted 08/2020. Ok now. Dr suggested tcs  ° ° °HPI:   °Austin Bright is a 76 y.o. male presenting today following an episode of acute diverticulitis of the sigmoid colon with associated microperforation in July 2022.  He was hospitalized during this time as he presented with sepsis secondary to diverticulitis.  He was treated supportively with IV antibiotics and fluids.  General surgery was consulted, but recommended nonoperative management.  He was discharged home to complete a 10-day course of Augmentin. GI was not consulted during his admission. We last saw patient in 2019.  ° °Last colonoscopy and March 2019 with diverticulosis in sigmoid and descending colon, internal hemorrhoids, otherwise normal exam.  No recommendations to repeat due to age. ° °Today: °Reports he has been doing very well since his episode of diverticulitis in July.  He has not had any recurrent abdominal pain.  Denies constipation, diarrhea, BRBPR, melena, unintentional weight loss.  Also denies any upper GI symptoms including GERD, dysphagia, nausea, vomiting. ° °He was found to have a nonspecific 4 mm hypodensity within proximal pancreas on CT July 2022.  Stated findings may represent focal fat intercalation or true lesion.  Recommended correlating with prior cross-sectional imaging to evaluate for stability, but no prior CT on file.  He is asymptomatic to this.  No jaundice.  No family history of pancreatic cancer.  Reports his grandmother had history of pancreatitis. ° °Regarding his cardiac history, he had an MI in 2013.  He initially had some decrease in his EF, but this had improved by November 2013.  He reports he has not had any problems with his heart since that time.  Denies chest pain or heart palpitations.  Has never had to use nitroglycerin.   ° °Past Medical History:   °Diagnosis Date  ° Acute MI (HCC) 08/2011  ° Arthritis   ° Bilateral pneumonia   ° Diagnosed after STEMI 08/2011  ° CAD (coronary artery disease)   ° a. Diagnosed 08/2011 with anterior STEMI due to thrombotic occlusion of mid LAD s/p thrombectomy, PTCA, DES placement 08/23/11.  ° CHF (congestive heart failure) (HCC)   ° Dyslipidemia   ° Gout   ° Hypertension   ° Ischemic cardiomyopathy   ° a. Initial EF 35% by cath 08/23/11, improved to 40-45% by echo 08/25/11. 50-55% by echo November 2013.  ° Shortness of breath   ° ° °Past Surgical History:  °Procedure Laterality Date  ° carpel tunnel Bilateral 2002  ° COLONOSCOPY WITH PROPOFOL N/A 04/08/2017  ° Surgeon: Rourk, Robert M, MD;  diverticulosis in sigmoid and descending colon, internal hemorrhoids, otherwise normal exam.  No recommendations to repeat due to age.  ° CORONARY STENT PLACEMENT    ° CYSTECTOMY  1969  ° pilonidal cyst  ° LEFT AND RIGHT HEART CATHETERIZATION WITH CORONARY ANGIOGRAM N/A 09/15/2011  ° Procedure: LEFT AND RIGHT HEART CATHETERIZATION WITH CORONARY ANGIOGRAM;  Surgeon: Thomas D Stuckey, MD;  Location: [facility] CATH LAB;  Service: Cardiovascular;  Laterality: N/A;  ° LEFT HEART CATH N/A 08/23/2011  ° Procedure: LEFT HEART CATH;  Surgeon: Muhammad A Arida, MD;  Location: [facility] CATH LAB;  Service: Cardiovascular;  Laterality: N/A;  ° NO PAST SURGERIES    ° PERCUTANEOUS CORONARY STENT INTERVENTION (PCI-S)  08/23/2011  ° Procedure: PERCUTANEOUS CORONARY STENT INTERVENTION (PCI-S);  Surgeon: Muhammad A   Kirke CorinArida, MD;  Location: [facility] CATH LAB;  Service: Cardiovascular;;    Current Outpatient Medications  Medication Sig Dispense Refill   allopurinol (ZYLOPRIM) 300 MG tablet TAKE 1 TABLET BY MOUTH ONCE DAILY FOR GOUT. 90 tablet 0   Alpha Lipoic Acid 200 MG CAPS Take 200 mg by mouth daily.      aspirin EC 81 MG tablet Take 81 mg by mouth daily.     chlorthalidone (HYGROTON) 25 MG tablet TAKE 1/2 TABLET BY MOUTH ONCE DAILY. 45 tablet 3   Coenzyme Q10 (CO Q-10) 100 MG  CAPS Take 100 mg by mouth daily.      ezetimibe (ZETIA) 10 MG tablet Take 1 tablet (10 mg total) by mouth daily. 90 tablet 3   Green Tea 150 MG CAPS Take 150 mg by mouth daily.      lisinopril (ZESTRIL) 20 MG tablet TAKE ONE TABLET BY MOUTH ONCE DAILY. 90 tablet 1   metoprolol succinate (TOPROL-XL) 25 MG 24 hr tablet TAKE 1 TABLET BY MOUTH ONCE DAILY. 90 tablet 3   nitroGLYCERIN (NITROSTAT) 0.4 MG SL tablet PLACE 1 TAB UNDER TONGUE EVERY 5 MIN IF NEEDED FOR CHEST PAIN. MAY USE 3 TIMES.NO RELIEF CALL 911. 25 tablet 3   rosuvastatin (CRESTOR) 40 MG tablet TAKE (1) TABLET BY MOUTH ONCE A DAY. 90 tablet 0   sildenafil (VIAGRA) 100 MG tablet TAKE (1) TABLET BY MOUTH AS NEEDED FOR ERECTILE DYSFUNCTION. 30 tablet 0   traZODone (DESYREL) 50 MG tablet Take 0.5-1 tablets (25-50 mg total) by mouth at bedtime as needed for sleep. 30 tablet 3   Turmeric 500 MG CAPS Take 500 mg by mouth daily.      vitamin B-12 (CYANOCOBALAMIN) 500 MCG tablet Take 1 tablet (500 mcg total) by mouth daily.     vitamin C (ASCORBIC ACID) 500 MG tablet Take 500 mg by mouth daily.     No current facility-administered medications for this visit.    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  Tobacco Use   Smoking status: Former    Packs/day: 1.00    Years: 25.00    Pack years: 25.00    Types: Cigarettes    Quit date: 08/23/1979    Years since quitting: 41.5   Smokeless tobacco: Never  Vaping Use   Vaping Use: Never  used  Substance and Sexual Activity   Alcohol use: Yes    Alcohol/week: 4.0 standard drinks    Types: 2 Cans of beer, 2 Shots of liquor per week    Comment: 1-2 drinks on the weekends   Drug use: No   Sexual activity: Yes  Other Topics Concern   Not on file  Social History Narrative   Widower since 152017,married for 10 years.Lives alone,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   Social Determinants of Health   Financial Resource Strain: Low Risk    Difficulty  of Paying Living Expenses: Not hard at all  Food Insecurity: No Food Insecurity   Worried About Running Out of Food in the Last Year: Never true   Ran Out of Food in the Last Year: Never true  Transportation Needs: No Transportation Needs   Lack of Transportation (Medical): No   Lack of Transportation (Non-Medical): No  Physical Activity: Insufficiently Active   Days of Exercise per Week: 6 days   Minutes of Exercise per Session: 20 min  Stress: No Stress Concern Present   Feeling of Stress : Not at all  Social Connections: Moderately Integrated   Frequency of Communication with Friends and Family: More than three times a week   Frequency of Social Gatherings with Friends and Family: More than three times a week   Attends Religious Services: Never   Database administrator or Organizations: Yes   Attends Engineer, structural: 1 to 4 times per year   Marital Status: Married  Catering manager Violence: Not At Risk   Fear of Current or Ex-Partner: No   Emotionally Abused: No   Physically Abused: No   Sexually Abused: No    Review of Systems: Gen: Denies any fever, chills, cold or flulike symptoms, presyncope, syncope. CV: Denies chest pain, heart palpitations. Resp: Denies shortness of breath or cough. GI: See HPI GU : Denies urinary burning, urinary frequency, urinary hesitancy MS: Denies joint pain. Derm: Denies rash. Psych: Denies depression, anxiety. Heme: See  HPI  Physical Exam: BP 126/84    Pulse 77    Temp 97.8 F (36.6 C) (Temporal)    Ht 5\' 10"  (1.778 m)    Wt 212 lb (96.2 kg)    BMI 30.42 kg/m  General:   Alert and oriented. Pleasant and cooperative. Well-nourished and well-developed.  Head:  Normocephalic and atraumatic. Eyes:  Without icterus, sclera clear and conjunctiva pink.  Ears:  Normal auditory acuity. Lungs:  Clear to auscultation bilaterally. No wheezes, rales, or rhonchi. No distress.  Heart:  S1, S2 present without murmurs appreciated.  Abdomen:  +BS, soft, non-tender and non-distended. No HSM noted. No guarding or rebound. No masses appreciated.  Rectal:  Deferred  Msk:  Symmetrical without gross deformities. Normal posture. Extremities:  Without edema. Neurologic:  Alert and  oriented x4;  grossly normal neurologically. Skin:  Intact without significant lesions or rashes. Psych: Normal mood and affect.    Assessment: 76 year old male with history of MI in 2013 s/p DES x1, ischemic cardiomyopathy in 2013, but EF returned to normal in November 2013, HTN, HLD, presenting today for follow-up after an episode of acute diverticulitis in July 2022.  He was hospitalized with acute diverticulitis of the sigmoid colon with associated microperforation.  GI was not consulted during that admission.  General surgery was consulted and recommended nonoperative management.  He was treated with IV fluids and IV antibiotics, discharged home on a 10-day course of Augmentin.  Clinically, he has been doing very well since that time with no recurrent abdominal pain.  He has no significant GI symptoms at this time.  His last colonoscopy was in 2019 with diverticulosis in sigmoid and descending colon, internal hemorrhoids, otherwise normal exam.  As it had been greater than 1 year since his last colonoscopy at the time of acute diverticulitis, he should have repeat colonoscopy to rule out underlying lesions.  We will arrange this in the near future.   Discussed scheduling with Dr. 2020 in the absence of Dr. Marletta Lor, patient agreeable.  Pancreatic lesion: Nonspecific 4 mm hypodensity within proximal pancreas on CT in July 2022.  Stated findings may represent focal fat intercalation or true lesion.  No prior CT on file to compare to.  He is asymptomatic to this.  No family history of pancreatic cancer.  We will pursue CT with pancreatic protocol for further evaluation.   Plan: Proceed with colonoscopy with propofol with Dr. Marletta Lor (in the absence of Dr. Jena Gauss) in the near future. The risks, benefits, and alternatives have been discussed with the patient in detail. The patient states understanding and desires to proceed. ASA 3 CT with pancreatic protocol Follow-up as needed.   Ermalinda Memos, PA-C [facility] 02/24/2021

## 2021-02-22 NOTE — Progress Notes (Signed)
Primary Care Physician:  Anabel HalonPatel, Rutwik K, MD Primary Gastroenterologist:  Dr. Jena Gaussourk  Chief Complaint  Patient presents with   Diverticulitis    Admitted 08/2020. Ok now. Dr suggested tcs    HPI:   Austin Bright is a 76 y.o. male presenting today following an episode of acute diverticulitis of the sigmoid colon with associated microperforation in July 2022.  He was hospitalized during this time as he presented with sepsis secondary to diverticulitis.  He was treated supportively with IV antibiotics and fluids.  General surgery was consulted, but recommended nonoperative management.  He was discharged home to complete a 10-day course of Augmentin. GI was not consulted during his admission. We last saw patient in 2019.   Last colonoscopy and March 2019 with diverticulosis in sigmoid and descending colon, internal hemorrhoids, otherwise normal exam.  No recommendations to repeat due to age.  Today: Reports he has been doing very well since his episode of diverticulitis in July.  He has not had any recurrent abdominal pain.  Denies constipation, diarrhea, BRBPR, melena, unintentional weight loss.  Also denies any upper GI symptoms including GERD, dysphagia, nausea, vomiting.  He was found to have a nonspecific 4 mm hypodensity within proximal pancreas on CT July 2022.  Stated findings may represent focal fat intercalation or true lesion.  Recommended correlating with prior cross-sectional imaging to evaluate for stability, but no prior CT on file.  He is asymptomatic to this.  No jaundice.  No family history of pancreatic cancer.  Reports his grandmother had history of pancreatitis.  Regarding his cardiac history, he had an MI in 2013.  He initially had some decrease in his EF, but this had improved by November 2013.  He reports he has not had any problems with his heart since that time.  Denies chest pain or heart palpitations.  Has never had to use nitroglycerin.    Past Medical History:   Diagnosis Date   Acute MI (HCC) 08/2011   Arthritis    Bilateral pneumonia    Diagnosed after STEMI 08/2011   CAD (coronary artery disease)    a. Diagnosed 08/2011 with anterior STEMI due to thrombotic occlusion of mid LAD s/p thrombectomy, PTCA, DES placement 08/23/11.   CHF (congestive heart failure) (HCC)    Dyslipidemia    Gout    Hypertension    Ischemic cardiomyopathy    a. Initial EF 35% by cath 08/23/11, improved to 40-45% by echo 08/25/11. 50-55% by echo November 2013.   Shortness of breath     Past Surgical History:  Procedure Laterality Date   carpel tunnel Bilateral 2002   COLONOSCOPY WITH PROPOFOL N/A 04/08/2017   Surgeon: Corbin Adeourk, Robert M, MD;  diverticulosis in sigmoid and descending colon, internal hemorrhoids, otherwise normal exam.  No recommendations to repeat due to age.   CORONARY STENT PLACEMENT     CYSTECTOMY  1969   pilonidal cyst   LEFT AND RIGHT HEART CATHETERIZATION WITH CORONARY ANGIOGRAM N/A 09/15/2011   Procedure: LEFT AND RIGHT HEART CATHETERIZATION WITH CORONARY ANGIOGRAM;  Surgeon: Herby Abrahamhomas D Stuckey, MD;  [facility];  Service: Cardiovascular;  Laterality: N/A;   LEFT HEART CATH N/A 08/23/2011   Procedure: LEFT HEART CATH;  Surgeon: Iran OuchMuhammad A Arida, MD;  Location: [facility] CATH LAB;  Service: Cardiovascular;  Laterality: N/A;   NO PAST SURGERIES     PERCUTANEOUS CORONARY STENT INTERVENTION (PCI-S)  08/23/2011   Procedure: PERCUTANEOUS CORONARY STENT INTERVENTION (PCI-S);  Surgeon: Chelsea AusMuhammad A  Kirke CorinArida, MD;  Location: [facility] CATH LAB;  Service: Cardiovascular;;    Current Outpatient Medications  Medication Sig Dispense Refill   allopurinol (ZYLOPRIM) 300 MG tablet TAKE 1 TABLET BY MOUTH ONCE DAILY FOR GOUT. 90 tablet 0   Alpha Lipoic Acid 200 MG CAPS Take 200 mg by mouth daily.      aspirin EC 81 MG tablet Take 81 mg by mouth daily.     chlorthalidone (HYGROTON) 25 MG tablet TAKE 1/2 TABLET BY MOUTH ONCE DAILY. 45 tablet 3   Coenzyme Q10 (CO Q-10) 100 MG  CAPS Take 100 mg by mouth daily.      ezetimibe (ZETIA) 10 MG tablet Take 1 tablet (10 mg total) by mouth daily. 90 tablet 3   Green Tea 150 MG CAPS Take 150 mg by mouth daily.      lisinopril (ZESTRIL) 20 MG tablet TAKE ONE TABLET BY MOUTH ONCE DAILY. 90 tablet 1   metoprolol succinate (TOPROL-XL) 25 MG 24 hr tablet TAKE 1 TABLET BY MOUTH ONCE DAILY. 90 tablet 3   nitroGLYCERIN (NITROSTAT) 0.4 MG SL tablet PLACE 1 TAB UNDER TONGUE EVERY 5 MIN IF NEEDED FOR CHEST PAIN. MAY USE 3 TIMES.NO RELIEF CALL 911. 25 tablet 3   rosuvastatin (CRESTOR) 40 MG tablet TAKE (1) TABLET BY MOUTH ONCE A DAY. 90 tablet 0   sildenafil (VIAGRA) 100 MG tablet TAKE (1) TABLET BY MOUTH AS NEEDED FOR ERECTILE DYSFUNCTION. 30 tablet 0   traZODone (DESYREL) 50 MG tablet Take 0.5-1 tablets (25-50 mg total) by mouth at bedtime as needed for sleep. 30 tablet 3   Turmeric 500 MG CAPS Take 500 mg by mouth daily.      vitamin B-12 (CYANOCOBALAMIN) 500 MCG tablet Take 1 tablet (500 mcg total) by mouth daily.     vitamin C (ASCORBIC ACID) 500 MG tablet Take 500 mg by mouth daily.     No current facility-administered medications for this visit.    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  Tobacco Use   Smoking status: Former    Packs/day: 1.00    Years: 25.00    Pack years: 25.00    Types: Cigarettes    Quit date: 08/23/1979    Years since quitting: 41.5   Smokeless tobacco: Never  Vaping Use   Vaping Use: Never  used  Substance and Sexual Activity   Alcohol use: Yes    Alcohol/week: 4.0 standard drinks    Types: 2 Cans of beer, 2 Shots of liquor per week    Comment: 1-2 drinks on the weekends   Drug use: No   Sexual activity: Yes  Other Topics Concern   Not on file  Social History Narrative   Widower since 152017,married for 10 years.Lives alone,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   Social Determinants of Health   Financial Resource Strain: Low Risk    Difficulty  of Paying Living Expenses: Not hard at all  Food Insecurity: No Food Insecurity   Worried About Running Out of Food in the Last Year: Never true   Ran Out of Food in the Last Year: Never true  Transportation Needs: No Transportation Needs   Lack of Transportation (Medical): No   Lack of Transportation (Non-Medical): No  Physical Activity: Insufficiently Active   Days of Exercise per Week: 6 days   Minutes of Exercise per Session: 20 min  Stress: No Stress Concern Present   Feeling of Stress : Not at all  Social Connections: Moderately Integrated   Frequency of Communication with Friends and Family: More than three times a week   Frequency of Social Gatherings with Friends and Family: More than three times a week   Attends Religious Services: Never   Database administrator or Organizations: Yes   Attends Engineer, structural: 1 to 4 times per year   Marital Status: Married  Catering manager Violence: Not At Risk   Fear of Current or Ex-Partner: No   Emotionally Abused: No   Physically Abused: No   Sexually Abused: No    Review of Systems: Gen: Denies any fever, chills, cold or flulike symptoms, presyncope, syncope. CV: Denies chest pain, heart palpitations. Resp: Denies shortness of breath or cough. GI: See HPI GU : Denies urinary burning, urinary frequency, urinary hesitancy MS: Denies joint pain. Derm: Denies rash. Psych: Denies depression, anxiety. Heme: See  HPI  Physical Exam: BP 126/84    Pulse 77    Temp 97.8 F (36.6 C) (Temporal)    Ht 5\' 10"  (1.778 m)    Wt 212 lb (96.2 kg)    BMI 30.42 kg/m  General:   Alert and oriented. Pleasant and cooperative. Well-nourished and well-developed.  Head:  Normocephalic and atraumatic. Eyes:  Without icterus, sclera clear and conjunctiva pink.  Ears:  Normal auditory acuity. Lungs:  Clear to auscultation bilaterally. No wheezes, rales, or rhonchi. No distress.  Heart:  S1, S2 present without murmurs appreciated.  Abdomen:  +BS, soft, non-tender and non-distended. No HSM noted. No guarding or rebound. No masses appreciated.  Rectal:  Deferred  Msk:  Symmetrical without gross deformities. Normal posture. Extremities:  Without edema. Neurologic:  Alert and  oriented x4;  grossly normal neurologically. Skin:  Intact without significant lesions or rashes. Psych: Normal mood and affect.    Assessment: 76 year old male with history of MI in 2013 s/p DES x1, ischemic cardiomyopathy in 2013, but EF returned to normal in November 2013, HTN, HLD, presenting today for follow-up after an episode of acute diverticulitis in July 2022.  He was hospitalized with acute diverticulitis of the sigmoid colon with associated microperforation.  GI was not consulted during that admission.  General surgery was consulted and recommended nonoperative management.  He was treated with IV fluids and IV antibiotics, discharged home on a 10-day course of Augmentin.  Clinically, he has been doing very well since that time with no recurrent abdominal pain.  He has no significant GI symptoms at this time.  His last colonoscopy was in 2019 with diverticulosis in sigmoid and descending colon, internal hemorrhoids, otherwise normal exam.  As it had been greater than 1 year since his last colonoscopy at the time of acute diverticulitis, he should have repeat colonoscopy to rule out underlying lesions.  We will arrange this in the near future.   Discussed scheduling with Dr. 2020 in the absence of Dr. Marletta Lor, patient agreeable.  Pancreatic lesion: Nonspecific 4 mm hypodensity within proximal pancreas on CT in July 2022.  Stated findings may represent focal fat intercalation or true lesion.  No prior CT on file to compare to.  He is asymptomatic to this.  No family history of pancreatic cancer.  We will pursue CT with pancreatic protocol for further evaluation.   Plan: Proceed with colonoscopy with propofol with Dr. Marletta Lor (in the absence of Dr. Jena Gauss) in the near future. The risks, benefits, and alternatives have been discussed with the patient in detail. The patient states understanding and desires to proceed. ASA 3 CT with pancreatic protocol Follow-up as needed.   Ermalinda Memos, PA-C [facility] Gastroenter[facility] 02/24/2021

## 2021-02-24 ENCOUNTER — Other Ambulatory Visit: Payer: Self-pay

## 2021-02-24 ENCOUNTER — Encounter: Payer: Self-pay | Admitting: Gastroenterology

## 2021-02-24 ENCOUNTER — Telehealth: Payer: Self-pay | Admitting: *Deleted

## 2021-02-24 ENCOUNTER — Encounter: Payer: Self-pay | Admitting: *Deleted

## 2021-02-24 ENCOUNTER — Ambulatory Visit: Payer: Medicare PPO | Admitting: Gastroenterology

## 2021-02-24 VITALS — BP 126/84 | HR 77 | Temp 97.8°F | Ht 70.0 in | Wt 212.0 lb

## 2021-02-24 DIAGNOSIS — K869 Disease of pancreas, unspecified: Secondary | ICD-10-CM

## 2021-02-24 DIAGNOSIS — Z8719 Personal history of other diseases of the digestive system: Secondary | ICD-10-CM

## 2021-02-24 MED ORDER — PEG 3350-KCL-NA BICARB-NACL 420 G PO SOLR: ORAL | 0 refills | Status: DC

## 2021-02-24 NOTE — Patient Instructions (Addendum)
We will arrange for you to have a colonoscopy in the near future with Dr. Marletta Lor.  We will also arrange for you to have a CT of your abdomen to take another look at the lesion that was seen on your pancreas.  We will follow-up with you in the office as needed.  Do not hesitate to call if you have any new GI concerns.   It was a pleasure meeting you today!  I am glad you are doing well!  Ermalinda Memos, PA-C [facility] Gastroenter[facility]

## 2021-02-24 NOTE — Telephone Encounter (Signed)
PA approved for CT via [employer]. Auth# [id number], DOS 02/13/202-04/02/2021

## 2021-02-24 NOTE — Telephone Encounter (Signed)
Called pt and aware of appt details. 

## 2021-02-24 NOTE — Telephone Encounter (Signed)
PA pending review via [employer] website. Track# [id number]

## 2021-02-25 NOTE — Telephone Encounter (Deleted)
PA approved. Auth# [id number], DOS 03/03/2021-04/02/2021

## 2021-02-28 NOTE — Telephone Encounter (Signed)
PA approved. Auth# [id number], DOS: 03/17/2021-06/15/2021

## 2021-03-05 ENCOUNTER — Other Ambulatory Visit (HOSPITAL_COMMUNITY): Payer: Medicare PPO

## 2021-03-10 NOTE — Telephone Encounter (Signed)
Austin Bright at pre-service center [facility]. PA for CT pancreas abd w/wo contrast needs PA from [facility]. Previous PA had incorrect CPT code. [phone], ext [phone]).  New PA for CT abd w/wo contrast (CPT code 18299) submitted via [employer] website. [employer]# [id number], valid 03/12/21-04/11/21.   Called Austin Bright back, informed her of new PA.

## 2021-03-11 DIAGNOSIS — K869 Disease of pancreas, unspecified: Secondary | ICD-10-CM | POA: Diagnosis not present

## 2021-03-11 LAB — CREATININE WITH EST GFR
Creat: 1.21 mg/dL (ref 0.70–1.28)
eGFR: 63 mL/min/{1.73_m2} (ref >=60)

## 2021-03-12 ENCOUNTER — Other Ambulatory Visit: Payer: Self-pay

## 2021-03-12 ENCOUNTER — Ambulatory Visit (HOSPITAL_COMMUNITY)
Admission: RE | Admit: 2021-03-12 | Discharge: 2021-03-12 | Disposition: A | Discharge status: Home / Self Care | Payer: Medicare PPO | Source: Ambulatory Visit | Attending: Gastroenterology | Admitting: Gastroenterology

## 2021-03-12 DIAGNOSIS — K8689 Other specified diseases of pancreas: Secondary | ICD-10-CM | POA: Diagnosis not present

## 2021-03-12 DIAGNOSIS — I700 Atherosclerosis of aorta: Secondary | ICD-10-CM | POA: Diagnosis not present

## 2021-03-12 DIAGNOSIS — K869 Disease of pancreas, unspecified: Secondary | ICD-10-CM | POA: Insufficient documentation

## 2021-03-12 MED ORDER — IOHEXOL 300 MG/ML  SOLN
100.0000 mL | Freq: Once | INTRAMUSCULAR | Status: AC | PRN
  Administered 2021-03-12: 100 mL via INTRAVENOUS

## 2021-03-12 NOTE — Patient Instructions (Signed)
Austin Bright  03/12/2021     @PREFPERIOPPHARMACY @   Your procedure is scheduled on  03/17/2021.   Report to 03/19/2021 at  [phone]  A.M.   Call this number if you have problems the morning of surgery:  [phone]   Remember:  Follow the diet and prep instructions given to you by the office.    Take these medicines the morning of surgery with A SIP OF WATER                          allopurinol, metoprolol.     Do not wear jewelry, make-up or nail polish.  Do not wear lotions, powders, or perfumes, or deodorant.  Do not shave 48 hours prior to surgery.  Men may shave face and neck.  Do not bring valuables to the hospital.  [facility] is not responsible for any belongings or valuables.  Contacts, dentures or bridgework may not be worn into surgery.  Leave your suitcase in the car.  After surgery it may be brought to your room.  For patients admitted to the hospital, discharge time will be determined by your treatment team.  Patients discharged the day of surgery will not be allowed to drive home and must have someone with them for 24 hours.    Special instructions:   DO NOT smoke tobacco or vape for 24 hours before your procedure.  Please read over the following fact sheets that you were given. Anesthesia Post-op Instructions and Care and Recovery After Surgery      Colonoscopy, Adult, Care After This sheet gives you information about how to care for yourself after your procedure. Your health care provider may also give you more specific instructions. If you have problems or questions, contact your health care provider. What can I expect after the procedure? After the procedure, it is common to have: A small amount of blood in your stool for 24 hours after the procedure. Some gas. Mild cramping or bloating of your abdomen. Follow these instructions at home: Eating and drinking  Drink enough fluid to keep your urine pale yellow. Follow instructions from  your health care provider about eating or drinking restrictions. Resume your normal diet as instructed by your health care provider. Avoid heavy or fried foods that are hard to digest. Activity Rest as told by your health care provider. Avoid sitting for a long time without moving. Get up to take short walks every 1-2 hours. This is important to improve blood flow and breathing. Ask for help if you feel weak or unsteady. Return to your normal activities as told by your health care provider. Ask your health care provider what activities are safe for you. Managing cramping and bloating  Try walking around when you have cramps or feel bloated. Apply heat to your abdomen as told by your health care provider. Use the heat source that your health care provider recommends, such as a moist heat pack or a heating pad. Place a towel between your skin and the heat source. Leave the heat on for 20-30 minutes. Remove the heat if your skin turns bright red. This is especially important if you are unable to feel pain, heat, or cold. You may have a greater risk of getting burned. General instructions If you were given a sedative during the procedure, it can affect you for several hours. Do not drive or operate machinery until your health care  provider says that it is safe. For the first 24 hours after the procedure: Do not sign important documents. Do not drink alcohol. Do your regular daily activities at a slower pace than normal. Eat soft foods that are easy to digest. Take over-the-counter and prescription medicines only as told by your health care provider. Keep all follow-up visits as told by your health care provider. This is important. Contact a health care provider if: You have blood in your stool 2-3 days after the procedure. Get help right away if you have: More than a small spotting of blood in your stool. Large blood clots in your stool. Swelling of your abdomen. Nausea or vomiting. A  fever. Increasing pain in your abdomen that is not relieved with medicine. Summary After the procedure, it is common to have a small amount of blood in your stool. You may also have mild cramping and bloating of your abdomen. If you were given a sedative during the procedure, it can affect you for several hours. Do not drive or operate machinery until your health care provider says that it is safe. Get help right away if you have a lot of blood in your stool, nausea or vomiting, a fever, or increased pain in your abdomen. This information is not intended to replace advice given to you by your health care provider. Make sure you discuss any questions you have with your health care provider. Document Revised: 11/11/2018 Document Reviewed: 08/01/2018 Elsevier Patient Education  [place] After This sheet gives you information about how to care for yourself after your procedure. Your health care provider may also give you more specific instructions. If you have problems or questions, contact your health care provider. What can I expect after the procedure? After the procedure, it is common to have: Tiredness. Forgetfulness about what happened after the procedure. Impaired judgment for important decisions. Nausea or vomiting. Some difficulty with balance. Follow these instructions at home: For the time period you were told by your health care provider:   Rest as needed. Do not participate in activities where you could fall or become injured. Do not drive or use machinery. Do not drink alcohol. Do not take sleeping pills or medicines that cause drowsiness. Do not make important decisions or sign legal documents. Do not take care of children on your own. Eating and drinking Follow the diet that is recommended by your health care provider. Drink enough fluid to keep your urine pale yellow. If you vomit: Drink water, juice, or soup when you can drink  without vomiting. Make sure you have little or no nausea before eating solid foods. General instructions Have a responsible adult stay with you for the time you are told. It is important to have someone help care for you until you are awake and alert. Take over-the-counter and prescription medicines only as told by your health care provider. If you have sleep apnea, surgery and certain medicines can increase your risk for breathing problems. Follow instructions from your health care provider about wearing your sleep device: Anytime you are sleeping, including during daytime naps. While taking prescription pain medicines, sleeping medicines, or medicines that make you drowsy. Avoid smoking. Keep all follow-up visits as told by your health care provider. This is important. Contact a health care provider if: You keep feeling nauseous or you keep vomiting. You feel light-headed. You are still sleepy or having trouble with balance after 24 hours. You develop a rash. You have a  fever. You have redness or swelling around the IV site. Get help right away if: You have trouble breathing. You have new-onset confusion at home. Summary For several hours after your procedure, you may feel tired. You may also be forgetful and have poor judgment. Have a responsible adult stay with you for the time you are told. It is important to have someone help care for you until you are awake and alert. Rest as told. Do not drive or operate machinery. Do not drink alcohol or take sleeping pills. Get help right away if you have trouble breathing, or if you suddenly become confused. This information is not intended to replace advice given to you by your health care provider. Make sure you discuss any questions you have with your health care provider. Document Revised: 09/21/2019 Document Reviewed: 12/08/2018 Elsevier Patient Education  2022 [employer].

## 2021-03-13 ENCOUNTER — Encounter (HOSPITAL_COMMUNITY)
Admission: RE | Admit: 2021-03-13 | Discharge: 2021-03-13 | Disposition: A | Discharge status: Home / Self Care | Payer: Medicare PPO | Source: Ambulatory Visit | Attending: Internal Medicine | Admitting: Internal Medicine

## 2021-03-17 ENCOUNTER — Encounter (HOSPITAL_COMMUNITY)
Admission: RE | Disposition: A | Discharge status: Home / Self Care | Payer: Self-pay | Source: Ambulatory Visit | Attending: Internal Medicine

## 2021-03-17 ENCOUNTER — Ambulatory Visit (HOSPITAL_COMMUNITY): Payer: Medicare PPO | Admitting: Anesthesiology

## 2021-03-17 ENCOUNTER — Ambulatory Visit (HOSPITAL_BASED_OUTPATIENT_CLINIC_OR_DEPARTMENT_OTHER): Payer: Medicare PPO | Admitting: Anesthesiology

## 2021-03-17 ENCOUNTER — Encounter (HOSPITAL_COMMUNITY): Payer: Self-pay

## 2021-03-17 ENCOUNTER — Ambulatory Visit (HOSPITAL_COMMUNITY)
Admission: RE | Admit: 2021-03-17 | Discharge: 2021-03-17 | Disposition: A | Discharge status: Home / Self Care | Payer: Medicare PPO | Source: Ambulatory Visit | Attending: Internal Medicine | Admitting: Internal Medicine

## 2021-03-17 DIAGNOSIS — Z09 Encounter for follow-up examination after completed treatment for conditions other than malignant neoplasm: Secondary | ICD-10-CM | POA: Diagnosis not present

## 2021-03-17 DIAGNOSIS — K5732 Diverticulitis of large intestine without perforation or abscess without bleeding: Secondary | ICD-10-CM

## 2021-03-17 DIAGNOSIS — I110 Hypertensive heart disease with heart failure: Secondary | ICD-10-CM | POA: Diagnosis not present

## 2021-03-17 DIAGNOSIS — K648 Other hemorrhoids: Secondary | ICD-10-CM

## 2021-03-17 DIAGNOSIS — I252 Old myocardial infarction: Secondary | ICD-10-CM | POA: Diagnosis not present

## 2021-03-17 DIAGNOSIS — Z8349 Family history of other endocrine, nutritional and metabolic diseases: Secondary | ICD-10-CM | POA: Diagnosis not present

## 2021-03-17 DIAGNOSIS — I2510 Atherosclerotic heart disease of native coronary artery without angina pectoris: Secondary | ICD-10-CM

## 2021-03-17 DIAGNOSIS — I509 Heart failure, unspecified: Secondary | ICD-10-CM | POA: Diagnosis not present

## 2021-03-17 DIAGNOSIS — Z87891 Personal history of nicotine dependence: Secondary | ICD-10-CM | POA: Diagnosis not present

## 2021-03-17 DIAGNOSIS — K5730 Diverticulosis of large intestine without perforation or abscess without bleeding: Secondary | ICD-10-CM

## 2021-03-17 HISTORY — PX: COLONOSCOPY WITH PROPOFOL: SHX5780

## 2021-03-17 SURGERY — COLONOSCOPY WITH PROPOFOL
Anesthesia: General

## 2021-03-17 MED ORDER — LIDOCAINE 2% (20 MG/ML) 5 ML SYRINGE
INTRAMUSCULAR | Status: DC | PRN
  Administered 2021-03-17: 50 mg via INTRAVENOUS

## 2021-03-17 MED ORDER — PROPOFOL 10 MG/ML IV BOLUS
INTRAVENOUS | Status: DC | PRN
  Administered 2021-03-17: 80 mg via INTRAVENOUS
  Administered 2021-03-17: 10 mg via INTRAVENOUS

## 2021-03-17 MED ORDER — LACTATED RINGERS IV SOLN: INTRAVENOUS | Status: DC

## 2021-03-17 MED ORDER — PROPOFOL 500 MG/50ML IV EMUL
INTRAVENOUS | Status: DC | PRN
  Administered 2021-03-17: 200 ug/kg/min via INTRAVENOUS

## 2021-03-17 NOTE — Transfer of Care (Signed)
Immediate Anesthesia Transfer of Care Note  Patient: Austin Bright  Procedure(s) Performed: COLONOSCOPY WITH PROPOFOL  Patient Location: Short Stay  Anesthesia Type:MAC  Level of Consciousness: sedated, patient cooperative and responds to stimulation  Airway & Oxygen Therapy: Patient Spontanous Breathing  Post-op Assessment: Report given to RN, Post -op Vital signs reviewed and stable and Patient moving all extremities  Post vital signs: Reviewed and stable  Last Vitals:  Vitals Value Taken Time  BP    Temp    Pulse    Resp    SpO2      Last Pain:  Vitals:   03/17/21 0801  PainSc: 0-No pain         Complications: No notable events documented.

## 2021-03-17 NOTE — Op Note (Signed)
[facility] Patient Name: Austin Bright Procedure Date: 03/17/2021 7:56 AM MRN: [id number] Date of Birth: Feb 06, 1945 Attending MD: Elon Alas. Abbey Chatters DO CSN: [id number] Age: 76 Admit Type: Outpatient Procedure:                Colonoscopy Indications:              Follow-up of diverticulitis Providers:                Elon Alas. Abbey Chatters, DO, Jessica Boudreaux, Janeece Riggers, RN, Suzan Garibaldi. Risa Grill, Technician Referring MD:              Medicines:                See the Anesthesia note for documentation of the                            administered medications Complications:            No immediate complications. Estimated Blood Loss:     Estimated blood loss: none. Procedure:                Pre-Anesthesia Assessment:                           - The anesthesia plan was to use monitored                            anesthesia care (MAC).                           After obtaining informed consent, the colonoscope                            was passed under direct vision. Throughout the                            procedure, the patient's blood pressure, pulse, and                            oxygen saturations were monitored continuously. The                            [id number]-HQ190L ([id number]) scope was introduced through                            the anus and advanced to the the cecum, identified                            by appendiceal orifice and ileocecal valve. The                            colonoscopy was performed without difficulty. The                            patient tolerated the procedure well.  The quality                            of the bowel preparation was evaluated using the                            BBPS [facility] Bowel Preparation Scale) with scores                            of: Right Colon = 3, Transverse Colon = 3 and Left                            Colon = 3 (entire mucosa seen well with no residual                             staining, small fragments of stool or opaque                            liquid). The total BBPS score equals 9. Scope In: 8:02:01 AM Scope Out: 8:11:53 AM Scope Withdrawal Time: 0 hours 7 minutes 0 seconds  Total Procedure Duration: 0 hours 9 minutes 52 seconds  Findings:      The perianal and digital rectal examinations were normal.      Non-bleeding internal hemorrhoids were found during endoscopy.      Multiple medium-mouthed diverticula were found in the sigmoid colon and       descending colon.      The exam was otherwise without abnormality. Impression:               - Non-bleeding internal hemorrhoids.                           - Diverticulosis in the sigmoid colon and in the                            descending colon.                           - The examination was otherwise normal.                           - No specimens collected. Moderate Sedation:      Per Anesthesia Care Recommendation:           - Patient has a contact number available for                            emergencies. The signs and symptoms of potential                            delayed complications were discussed with the                            patient. Return to normal activities tomorrow.  Written discharge instructions were provided to the                            patient.                           - Resume previous diet.                           - Continue present medications.                           - No repeat colonoscopy due to age.                           - Return to GI clinic PRN. Procedure Code(s):        --- Professional ---                           [phone], Colonoscopy, flexible; diagnostic, including                            collection of specimen(s) by brushing or washing,                            when performed (separate procedure) Diagnosis Code(s):        --- Professional ---                           K64.8, Other hemorrhoids                            K57.32, Diverticulitis of large intestine without                            perforation or abscess without bleeding                           K57.30, Diverticulosis of large intestine without                            perforation or abscess without bleeding CPT copyright 2019 American Medical Association. All rights reserved. The codes documented in this report are preliminary and upon coder review may  be revised to meet current compliance requirements. Elon Alas. Abbey Chatters, DO [place] Abbey Chatters, DO 03/17/2021 8:15:49 AM This report has been signed electronically. Number of Addenda: 0

## 2021-03-17 NOTE — Interval H&P Note (Signed)
History and Physical Interval Note:  03/17/2021 7:53 AM  Austin Bright  has presented today for surgery, with the diagnosis of hx diverticulitis.  The various methods of treatment have been discussed with the patient and family. After consideration of risks, benefits and other options for treatment, the patient has consented to  Procedure(s) with comments: COLONOSCOPY WITH PROPOFOL (N/A) - 8:00am as a surgical intervention.  The patient's history has been reviewed, patient examined, no change in status, stable for surgery.  I have reviewed the patient's chart and labs.  Questions were answered to the patient's satisfaction.     Lanelle Bal

## 2021-03-17 NOTE — Discharge Instructions (Addendum)
°  Colonoscopy °Discharge Instructions ° °Read the instructions outlined below and refer to this sheet in the next few weeks. These discharge instructions provide you with general information on caring for yourself after you leave the hospital. Your doctor may also give you specific instructions. While your treatment has been planned according to the most current medical practices available, unavoidable complications occasionally occur.  ° °ACTIVITY °You may resume your regular activity, but move at a slower pace for the next 24 hours.  °Take frequent rest periods for the next 24 hours.  °Walking will help get rid of the air and reduce the bloated feeling in your belly (abdomen).  °No driving for 24 hours (because of the medicine (anesthesia) used during the test).   °Do not sign any important legal documents or operate any machinery for 24 hours (because of the anesthesia used during the test).  °NUTRITION °Drink plenty of fluids.  °You may resume your normal diet as instructed by your doctor.  °Begin with a light meal and progress to your normal diet. Heavy or fried foods are harder to digest and may make you feel sick to your stomach (nauseated).  °Avoid alcoholic beverages for 24 hours or as instructed.  °MEDICATIONS °You may resume your normal medications unless your doctor tells you otherwise.  °WHAT YOU CAN EXPECT TODAY °Some feelings of bloating in the abdomen.  °Passage of more gas than usual.  °Spotting of blood in your stool or on the toilet paper.  °IF YOU HAD POLYPS REMOVED DURING THE COLONOSCOPY: °No aspirin products for 7 days or as instructed.  °No alcohol for 7 days or as instructed.  °Eat a soft diet for the next 24 hours.  °FINDING OUT THE RESULTS OF YOUR TEST °Not all test results are available during your visit. If your test results are not back during the visit, make an appointment with your caregiver to find out the results. Do not assume everything is normal if you have not heard from your  caregiver or the medical facility. It is important for you to follow up on all of your test results.  °SEEK IMMEDIATE MEDICAL ATTENTION IF: °You have more than a spotting of blood in your stool.  °Your belly is swollen (abdominal distention).  °You are nauseated or vomiting.  °You have a temperature over 101.  °You have abdominal pain or discomfort that is severe or gets worse throughout the day.  ° °Your colonoscopy was relatively unremarkable.  I did not find any polyps or evidence of colon cancer.  I recommend repeating colonoscopy in 10 years for colon cancer screening purposes.  You do have diverticulosis and internal hemorrhoids. I would recommend increasing fiber in your diet or adding OTC Benefiber/Metamucil. Be sure to drink at least 4 to 6 glasses of water daily. Follow-up with GI as needed. ° ° °I hope you have a great rest of your week! ° °Charles K. Carver, D.O. °Gastroenterology and Hepatology °[facility] ° °

## 2021-03-17 NOTE — Anesthesia Preprocedure Evaluation (Signed)
Anesthesia Evaluation  Patient identified by MRN, date of birth, ID band Patient awake    Reviewed: Allergy & Precautions, NPO status , Patient's Chart, lab work & pertinent test results, reviewed documented beta blocker date and time   Airway Mallampati: I  TM Distance: >3 FB Neck ROM: Full    Dental  (+) Dental Advisory Given, Caps   Pulmonary shortness of breath and with exertion, pneumonia, former smoker,    Pulmonary exam normal breath sounds clear to auscultation       Cardiovascular hypertension, Pt. on home beta blockers and Pt. on medications + CAD, + Past MI, + Cardiac Stents and +CHF  Normal cardiovascular exam Rhythm:Regular Rate:Normal     Neuro/Psych negative neurological ROS  negative psych ROS   GI/Hepatic negative GI ROS, Neg liver ROS,   Endo/Other  negative endocrine ROS  Renal/GU negative Renal ROS  negative genitourinary   Musculoskeletal  (+) Arthritis ,   Abdominal   Peds negative pediatric ROS (+)  Hematology negative hematology ROS (+)   Anesthesia Other Findings   Reproductive/Obstetrics negative OB ROS                            Anesthesia Physical Anesthesia Plan  ASA: 3  Anesthesia Plan: General   Post-op Pain Management: Minimal or no pain anticipated   Induction: Intravenous  PONV Risk Score and Plan: TIVA  Airway Management Planned: Nasal Cannula and Natural Airway  Additional Equipment:   Intra-op Plan:   Post-operative Plan:   Informed Consent: I have reviewed the patients History and Physical, chart, labs and discussed the procedure including the risks, benefits and alternatives for the proposed anesthesia with the patient or authorized representative who has indicated his/her understanding and acceptance.     Dental advisory given  Plan Discussed with: CRNA and Surgeon  Anesthesia Plan Comments:        Anesthesia Quick  Evaluation

## 2021-03-17 NOTE — Anesthesia Postprocedure Evaluation (Signed)
Anesthesia Post Note  Patient: Austin Bright  Procedure(s) Performed: COLONOSCOPY WITH PROPOFOL  Patient location during evaluation: Phase Bright Anesthesia Type: General Level of consciousness: awake and alert and oriented Pain management: pain level controlled Vital Signs Assessment: post-procedure vital signs reviewed and stable Respiratory status: spontaneous breathing, nonlabored ventilation and respiratory function stable Cardiovascular status: blood pressure returned to baseline and stable Postop Assessment: no apparent nausea or vomiting Anesthetic complications: no   No notable events documented.   Last Vitals:  Vitals:   03/17/21 0715 03/17/21 0814  BP: 139/88 99/79  Pulse: 80   Resp: (!) 25 20  Temp: 36.9 C 36.9 C  SpO2: 98% 98%    Last Pain:  Vitals:   03/17/21 0814  TempSrc: Oral  PainSc: 0-No pain                 Erastus Bartolomei C Areyana Leoni

## 2021-03-20 ENCOUNTER — Encounter (HOSPITAL_COMMUNITY): Payer: Self-pay | Admitting: Internal Medicine

## 2021-04-29 ENCOUNTER — Other Ambulatory Visit: Payer: Self-pay | Admitting: Internal Medicine

## 2021-05-01 ENCOUNTER — Other Ambulatory Visit: Payer: Self-pay | Admitting: Internal Medicine

## 2021-05-01 ENCOUNTER — Encounter: Payer: Self-pay | Admitting: Internal Medicine

## 2021-05-01 DIAGNOSIS — H109 Unspecified conjunctivitis: Secondary | ICD-10-CM

## 2021-05-01 MED ORDER — OFLOXACIN 0.3 % OP SOLN: 1.0000 [drp] | Freq: Four times a day (QID) | OPHTHALMIC | 0 refills | Status: DC

## 2021-05-10 ENCOUNTER — Other Ambulatory Visit: Payer: Self-pay | Admitting: Internal Medicine

## 2021-05-24 ENCOUNTER — Other Ambulatory Visit: Payer: Self-pay | Admitting: Internal Medicine

## 2021-06-01 ENCOUNTER — Encounter: Payer: Self-pay | Admitting: Internal Medicine

## 2021-06-02 ENCOUNTER — Encounter: Payer: Medicare PPO | Admitting: Internal Medicine

## 2021-07-16 DIAGNOSIS — Z0000 Encounter for general adult medical examination without abnormal findings: Secondary | ICD-10-CM | POA: Diagnosis not present

## 2021-07-16 DIAGNOSIS — I2510 Atherosclerotic heart disease of native coronary artery without angina pectoris: Secondary | ICD-10-CM | POA: Diagnosis not present

## 2021-07-16 DIAGNOSIS — Z125 Encounter for screening for malignant neoplasm of prostate: Secondary | ICD-10-CM | POA: Diagnosis not present

## 2021-07-17 LAB — LIPID PANEL
Chol/HDL Ratio: 2.5 ratio (ref 0.0–5.0)
Cholesterol, Total: 123 mg/dL (ref 100–199)
HDL: 50 mg/dL (ref >39)
LDL Chol Calc (NIH): 52 mg/dL (ref 0–99)
Triglycerides: 115 mg/dL (ref 0–149)
VLDL Cholesterol Cal: 21 mg/dL (ref 5–40)

## 2021-07-17 LAB — CBC WITH DIFFERENTIAL/PLATELET
Basophils Absolute: 0 10*3/uL (ref 0.0–0.2)
Basos: 0 %
EOS (ABSOLUTE): 0.2 10*3/uL (ref 0.0–0.4)
Eos: 2 %
Hematocrit: 47.5 % (ref 37.5–51.0)
Hemoglobin: 16.4 g/dL (ref 13.0–17.7)
Immature Grans (Abs): 0 10*3/uL (ref 0.0–0.1)
Immature Granulocytes: 0 %
Lymphocytes Absolute: 1.4 10*3/uL (ref 0.7–3.1)
Lymphs: 20 %
MCH: 33.3 pg — ABNORMAL HIGH (ref 26.6–33.0)
MCHC: 34.5 g/dL (ref 31.5–35.7)
MCV: 96 fL (ref 79–97)
Monocytes Absolute: 0.6 10*3/uL (ref 0.1–0.9)
Monocytes: 8 %
Neutrophils Absolute: 5.1 10*3/uL (ref 1.4–7.0)
Neutrophils: 70 %
Platelets: 165 10*3/uL (ref 150–450)
RBC: 4.93 x10E6/uL (ref 4.14–5.80)
RDW: 13.5 % (ref 11.6–15.4)
WBC: 7.3 10*3/uL (ref 3.4–10.8)

## 2021-07-17 LAB — CMP14+EGFR
ALT: 40 IU/L (ref 0–44)
AST: 33 IU/L (ref 0–40)
Albumin/Globulin Ratio: 2.1 (ref 1.2–2.2)
Albumin: 4.7 g/dL (ref 3.7–4.7)
Alkaline Phosphatase: 70 IU/L (ref 44–121)
BUN/Creatinine Ratio: 16 (ref 10–24)
BUN: 17 mg/dL (ref 8–27)
Bilirubin Total: 0.5 mg/dL (ref 0.0–1.2)
CO2: 23 mmol/L (ref 20–29)
Calcium: 8.4 mg/dL — ABNORMAL LOW (ref 8.6–10.2)
Chloride: 97 mmol/L (ref 96–106)
Creatinine, Ser: 1.06 mg/dL (ref 0.76–1.27)
Globulin, Total: 2.2 g/dL (ref 1.5–4.5)
Glucose: 103 mg/dL — ABNORMAL HIGH (ref 70–99)
Potassium: 4.2 mmol/L (ref 3.5–5.2)
Sodium: 140 mmol/L (ref 134–144)
Total Protein: 6.9 g/dL (ref 6.0–8.5)
eGFR: 74 mL/min/{1.73_m2} (ref >59)

## 2021-07-17 LAB — HEMOGLOBIN A1C
Est. average glucose Bld gHb Est-mCnc: 117 mg/dL
Hgb A1c MFr Bld: 5.7 % — ABNORMAL HIGH (ref 4.8–5.6)

## 2021-07-17 LAB — VITAMIN D 25 HYDROXY (VIT D DEFICIENCY, FRACTURES): Vit D, 25-Hydroxy: 24.7 ng/mL — ABNORMAL LOW (ref 30.0–100.0)

## 2021-07-17 LAB — PSA: Prostate Specific Ag, Serum: 1.4 ng/mL (ref 0.0–4.0)

## 2021-07-17 LAB — TSH: TSH: 1.25 u[IU]/mL (ref 0.450–4.500)

## 2021-07-18 ENCOUNTER — Ambulatory Visit (INDEPENDENT_AMBULATORY_CARE_PROVIDER_SITE_OTHER): Payer: Medicare PPO | Admitting: Internal Medicine

## 2021-07-18 ENCOUNTER — Encounter: Payer: Self-pay | Admitting: Internal Medicine

## 2021-07-18 VITALS — BP 118/60 | HR 56 | Resp 18 | Ht 70.0 in | Wt 215.6 lb

## 2021-07-18 DIAGNOSIS — I10 Essential (primary) hypertension: Secondary | ICD-10-CM | POA: Diagnosis not present

## 2021-07-18 DIAGNOSIS — R7303 Prediabetes: Secondary | ICD-10-CM | POA: Diagnosis not present

## 2021-07-18 DIAGNOSIS — I2510 Atherosclerotic heart disease of native coronary artery without angina pectoris: Secondary | ICD-10-CM

## 2021-07-18 DIAGNOSIS — Z0001 Encounter for general adult medical examination with abnormal findings: Secondary | ICD-10-CM

## 2021-07-18 DIAGNOSIS — E559 Vitamin D deficiency, unspecified: Secondary | ICD-10-CM | POA: Diagnosis not present

## 2021-07-18 NOTE — Assessment & Plan Note (Signed)
Advised to take Vitamin D 2000 IU QD °

## 2021-07-18 NOTE — Assessment & Plan Note (Signed)

## 2021-07-18 NOTE — Progress Notes (Signed)
Established Patient Office Visit  Subjective:  Patient ID: Austin Bright, male    DOB: [birth year]  Age: 76 y.o. MRN: [id number]  CC:  Chief Complaint  Patient presents with   Annual Exam    Annual exam     HPI Austin Bright is a 76 y.o. male with past medical history of CAD s/p stent placement, HTN, HLD, gout and diverticulitis who presents for annual physical.  HTN and CAD: BP is well-controlled. Takes medications regularly. Patient denies headache, dizziness, chest pain, dyspnea or palpitations.  He follows up with cardiology.  He has a history of stent placement, and takes aspirin and statin.  He also takes metoprolol and lisinopril.  Blood tests were reviewed and discussed with the patient in detail.     Past Medical History:  Diagnosis Date   Acute diverticulitis 08/17/2020   Acute MI ([place]) 08/2011   Arthritis    Bilateral pneumonia    Diagnosed after STEMI 08/2011   CAD (coronary artery disease)    a. Diagnosed 08/2011 with anterior STEMI due to thrombotic occlusion of mid LAD s/p thrombectomy, PTCA, DES placement 08/23/11.   CHF (congestive heart failure) (HCC)    Dyslipidemia    Gout    Hypertension    Ischemic cardiomyopathy    a. Initial EF 35% by cath 08/23/11, improved to 40-45% by echo 08/25/11. 50-55% by echo November 2013.   Shortness of breath     Past Surgical History:  Procedure Laterality Date   carpel tunnel Bilateral 2002   COLONOSCOPY WITH PROPOFOL N/A 04/08/2017   Surgeon: Daneil Dolin, MD;  diverticulosis in sigmoid and descending colon, internal hemorrhoids, otherwise normal exam.  No recommendations to repeat due to age.   COLONOSCOPY WITH PROPOFOL N/A 03/17/2021   Procedure: COLONOSCOPY WITH PROPOFOL;  Surgeon: Eloise Harman, DO;  Location: [facility];  Service: Endoscopy;  Laterality: N/A;  8:00am   CORONARY STENT PLACEMENT     CYSTECTOMY  1969   pilonidal cyst   LEFT AND RIGHT HEART CATHETERIZATION WITH CORONARY ANGIOGRAM N/A  09/15/2011   Procedure: LEFT AND RIGHT HEART CATHETERIZATION WITH CORONARY ANGIOGRAM;  Surgeon: Hillary Bow, MD;  Location: [facility] CATH LAB;  Service: Cardiovascular;  Laterality: N/A;   LEFT HEART CATH N/A 08/23/2011   Procedure: LEFT HEART CATH;  Surgeon: Wellington Hampshire, MD;  Location: No Name CATH LAB;  Service: Cardiovascular;  Laterality: N/A;   NO PAST SURGERIES     PERCUTANEOUS CORONARY STENT INTERVENTION (PCI-S)  08/23/2011   Procedure: PERCUTANEOUS CORONARY STENT INTERVENTION (PCI-S);  Surgeon: Wellington Hampshire, MD;  Location: [facility] CATH LAB;  Service: Cardiovascular;;    Family History  Problem Relation Age of Onset   Stroke Mother    Hypertension Father    Colon cancer Neg Hx    Pancreatic cancer Neg Hx     Social History   Socioeconomic History   Marital status: Married    Spouse name: Not on file   Number of children: Not on file   Years of education: Not on file   Highest education level: Not on file  Occupational History   Occupation: Comptroller  Tobacco Use   Smoking status: Former    Packs/day: 1.00    Years: 25.00    Total pack years: 25.00    Types: Cigarettes    Quit date: 08/23/1979    Years since quitting: 41.9   Smokeless tobacco: Never  Vaping Use   Vaping Use:  Never used  Substance and Sexual Activity   Alcohol use: Yes    Alcohol/week: 4.0 standard drinks of alcohol    Types: 2 Cans of beer, 2 Shots of liquor per week    Comment: 1-2 drinks on the weekends   Drug use: No   Sexual activity: Yes  Other Topics Concern   Not on file  Social History Narrative   Widower since 89,married for 10 years.Lives alone,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.Has a girlfriend.[employer].   Social Determinants of Health   Financial Resource Strain: Low Risk  (10/04/2020)   Overall Financial Resource Strain (CARDIA)    Difficulty of Paying Living Expenses: Not hard at all  Food Insecurity: No Food Insecurity (10/04/2020)   Hunger Vital Sign     Worried About Running Out of Food in the Last Year: Never true    Ran Out of Food in the Last Year: Never true  Transportation Needs: No Transportation Needs (10/04/2020)   PRAPARE - Hydrologist (Medical): No    Lack of Transportation (Non-Medical): No  Physical Activity: Insufficiently Active (10/04/2020)   Exercise Vital Sign    Days of Exercise per Week: 6 days    Minutes of Exercise per Session: 20 min  Stress: No Stress Concern Present (10/04/2020)   [place]    Feeling of Stress : Not at all  Social Connections: Moderately Integrated (10/04/2020)   Social Connection and Isolation Panel [NHANES]    Frequency of Communication with Friends and Family: More than three times a week    Frequency of Social Gatherings with Friends and Family: More than three times a week    Attends Religious Services: Never    Marine scientist or Organizations: Yes    Attends Archivist Meetings: 1 to 4 times per year    Marital Status: Married  Human resources officer Violence: Not At Risk (10/04/2020)   Humiliation, Afraid, Rape, and Kick questionnaire    Fear of Current or Ex-Partner: No    Emotionally Abused: No    Physically Abused: No    Sexually Abused: No    Outpatient Medications Prior to Visit  Medication Sig Dispense Refill   allopurinol (ZYLOPRIM) 300 MG tablet TAKE 1 TABLET BY MOUTH ONCE DAILY FOR GOUT. 90 tablet 0   Alpha Lipoic Acid 200 MG CAPS Take 200 mg by mouth daily.      aspirin EC 81 MG tablet Take 81 mg by mouth daily.     chlorthalidone (HYGROTON) 25 MG tablet TAKE 1/2 TABLET BY MOUTH ONCE DAILY. 45 tablet 3   Coenzyme Q10 (CO Q-10) 100 MG CAPS Take 100 mg by mouth daily.      ezetimibe (ZETIA) 10 MG tablet Take 10 mg by mouth daily.     Green Tea 150 MG CAPS Take 150 mg by mouth daily.      lisinopril (ZESTRIL) 20 MG tablet TAKE ONE TABLET BY MOUTH ONCE DAILY. 90 tablet 0    metoprolol succinate (TOPROL-XL) 25 MG 24 hr tablet TAKE 1 TABLET BY MOUTH ONCE DAILY. 90 tablet 3   nitroGLYCERIN (NITROSTAT) 0.4 MG SL tablet PLACE 1 TAB UNDER TONGUE EVERY 5 MIN IF NEEDED FOR CHEST PAIN. MAY USE 3 TIMES.NO RELIEF CALL 911. 25 tablet 3   rosuvastatin (CRESTOR) 40 MG tablet TAKE (1) TABLET BY MOUTH ONCE A DAY. 90 tablet 0   sildenafil (VIAGRA) 100 MG tablet TAKE (1) TABLET  BY MOUTH AS NEEDED FOR ERECTILE DYSFUNCTION. (Patient taking differently: Take 50 mg by mouth as needed for erectile dysfunction. Austin Kitchen) 30 tablet 0   traZODone (DESYREL) 50 MG tablet Take 0.5-1 tablets (25-50 mg total) by mouth at bedtime as needed for sleep. (Patient taking differently: Take 25 mg by mouth at bedtime as needed for sleep.) 30 tablet 3   Turmeric 500 MG CAPS Take 500 mg by mouth daily.      vitamin B-12 (CYANOCOBALAMIN) 500 MCG tablet Take 1 tablet (500 mcg total) by mouth daily.     vitamin C (ASCORBIC ACID) 500 MG tablet Take 500 mg by mouth daily.     ofloxacin (OCUFLOX) 0.3 % ophthalmic solution Place 1 drop into the left eye 4 (four) times daily. 5 mL 0   No facility-administered medications prior to visit.    Allergies  Allergen Reactions   Flonase [Fluticasone Propionate] Other (See Comments)    Nose bleeds.    ROS Review of Systems  Constitutional:  Negative for chills and fever.  HENT:  Negative for congestion and sore throat.   Eyes:  Negative for pain and discharge.  Respiratory:  Negative for cough and shortness of breath.   Cardiovascular:  Negative for chest pain and palpitations.  Gastrointestinal:  Negative for constipation, diarrhea, nausea and vomiting.  Endocrine: Negative for polydipsia and polyuria.  Genitourinary:  Negative for dysuria and hematuria.  Musculoskeletal:  Negative for neck pain and neck stiffness.  Skin:  Negative for rash.  Neurological:  Negative for dizziness, weakness, numbness and headaches.  Psychiatric/Behavioral:  Negative for agitation  and behavioral problems.       Objective:    Physical Exam Vitals reviewed.  Constitutional:      General: He is not in acute distress.    Appearance: He is not diaphoretic.  HENT:     Head: Normocephalic and atraumatic.     Nose: Nose normal.     Mouth/Throat:     Mouth: Mucous membranes are moist.  Eyes:     General: No scleral icterus.    Extraocular Movements: Extraocular movements intact.  Cardiovascular:     Rate and Rhythm: Normal rate and regular rhythm.     Pulses: Normal pulses.     Heart sounds: Normal heart sounds. No murmur heard. Pulmonary:     Breath sounds: Normal breath sounds. No wheezing or rales.  Abdominal:     Palpations: Abdomen is soft.     Tenderness: There is no abdominal tenderness.  Musculoskeletal:     Cervical back: Neck supple. No tenderness.     Right lower leg: No edema.     Left lower leg: No edema.  Skin:    General: Skin is warm.     Findings: No rash.  Neurological:     General: No focal deficit present.     Mental Status: He is alert and oriented to person, place, and time.     Sensory: No sensory deficit.     Motor: No weakness.  Psychiatric:        Mood and Affect: Mood normal.        Behavior: Behavior normal.     BP 118/60 (BP Location: Right Arm, Patient Position: Sitting, Cuff Size: Normal)   Pulse (!) 56   Resp 18   Ht 5' 10" (1.778 m)   Wt 215 lb 9.6 oz (97.8 kg)   SpO2 97%   BMI 30.94 kg/m  Wt Readings from Last 3 Encounters:  07/18/21  215 lb 9.6 oz (97.8 kg)  03/17/21 204 lb (92.5 kg)  02/24/21 212 lb (96.2 kg)    Lab Results  Component Value Date   TSH 1.250 07/16/2021   Lab Results  Component Value Date   WBC 7.3 07/16/2021   HGB 16.4 07/16/2021   HCT 47.5 07/16/2021   MCV 96 07/16/2021   PLT 165 07/16/2021   Lab Results  Component Value Date   NA 140 07/16/2021   K 4.2 07/16/2021   CO2 23 07/16/2021   GLUCOSE 103 (H) 07/16/2021   BUN 17 07/16/2021   CREATININE 1.06 07/16/2021   BILITOT  0.5 07/16/2021   ALKPHOS 70 07/16/2021   AST 33 07/16/2021   ALT 40 07/16/2021   PROT 6.9 07/16/2021   ALBUMIN 4.7 07/16/2021   CALCIUM 8.4 (L) 07/16/2021   ANIONGAP 11 08/19/2020   EGFR 74 07/16/2021   Lab Results  Component Value Date   CHOL 123 07/16/2021   Lab Results  Component Value Date   HDL 50 07/16/2021   Lab Results  Component Value Date   LDLCALC 52 07/16/2021   Lab Results  Component Value Date   TRIG 115 07/16/2021   Lab Results  Component Value Date   CHOLHDL 2.5 07/16/2021   Lab Results  Component Value Date   HGBA1C 5.7 (H) 07/16/2021      Assessment & Plan:   Problem List Items Addressed This Visit       Cardiovascular and Mediastinum   Coronary artery disease involving native coronary artery of native heart without angina pectoris    On Aspirin and statin On Metoprolol and Lisinopril Had ischemic CM, but LVEF has improved now. Followed by Cardiology      Essential hypertension    BP Readings from Last 1 Encounters:  07/18/21 118/60  Well-controlled with Lisinopril, Chlorthalidone and Metoprolol Counseled for compliance with the medications Advised DASH diet and moderate exercise/walking, at least 150 mins/week        Other   Encounter for general adult medical examination with abnormal findings - Primary    Physical exam as documented. Counseling done  re healthy lifestyle involving commitment to 150 minutes exercise per week, heart healthy diet, and attaining healthy weight.The importance of adequate sleep also discussed. Changes in health habits are decided on by the patient with goals and time frames  set for achieving them. Immunization and cancer screening needs are specifically addressed at this visit.      Prediabetes    Advised to follow DASH diet for now      Vitamin D deficiency    Advised to take Vitamin D 2000 IU QD       No orders of the defined types were placed in this encounter.   Follow-up: Return in  about 6 months (around 01/17/2022) for HTN and CAD.    Lindell Spar, MD

## 2021-07-18 NOTE — Assessment & Plan Note (Addendum)
Advised to follow DASH diet for now 

## 2021-07-18 NOTE — Assessment & Plan Note (Signed)
On Aspirin and statin On Metoprolol and Lisinopril Had ischemic CM, but LVEF has improved now. Followed by Cardiology 

## 2021-07-18 NOTE — Patient Instructions (Addendum)
Please continue to take medications as prescribed.  Please continue to follow DASH diet and ambulate as tolerated.  Please take Vitamin D 2000 IU once daily.  Please consider getting Shingrix vaccine at your local pharmacy.

## 2021-07-18 NOTE — Assessment & Plan Note (Signed)
BP Readings from Last 1 Encounters:  07/18/21 118/60   Well-controlled with Lisinopril, Chlorthalidone and Metoprolol Counseled for compliance with the medications Advised DASH diet and moderate exercise/walking, at least 150 mins/week

## 2021-08-03 ENCOUNTER — Other Ambulatory Visit: Payer: Self-pay | Admitting: Internal Medicine

## 2021-08-21 ENCOUNTER — Other Ambulatory Visit: Payer: Self-pay | Admitting: Cardiology

## 2021-08-25 ENCOUNTER — Other Ambulatory Visit: Payer: Self-pay | Admitting: Internal Medicine

## 2021-09-29 ENCOUNTER — Other Ambulatory Visit: Payer: Self-pay | Admitting: Cardiology

## 2021-10-07 ENCOUNTER — Telehealth: Payer: Self-pay | Admitting: Internal Medicine

## 2021-10-07 NOTE — Telephone Encounter (Signed)
Patient returning missed call. 

## 2021-10-07 NOTE — Telephone Encounter (Signed)
Needs awv scheduled next week

## 2021-10-15 ENCOUNTER — Ambulatory Visit (INDEPENDENT_AMBULATORY_CARE_PROVIDER_SITE_OTHER): Payer: Medicare PPO | Admitting: Nurse Practitioner

## 2021-10-15 ENCOUNTER — Encounter: Payer: Self-pay | Admitting: Nurse Practitioner

## 2021-10-15 DIAGNOSIS — Z0000 Encounter for general adult medical examination without abnormal findings: Secondary | ICD-10-CM | POA: Diagnosis not present

## 2021-10-15 NOTE — Progress Notes (Signed)
I connected with  Austin Bright on 10/15/21 by a audio enabled telemedicine application and verified that I am speaking with the correct person using two identifiers.  Patient Location: Home  Provider Location: Home Office  I discussed the limitations of evaluation and management by telemedicine. The patient expressed understanding and agreed to proceed.   Subjective:   Austin Bright is a 76 y.o. male who presents for Medicare Annual/Subsequent preventive examination.  Review of Systems           Objective:    There were no vitals filed for this visit. There is no height or weight on file to calculate BMI.     03/17/2021    7:05 AM 03/13/2021    8:56 AM 10/04/2020    3:39 PM 08/18/2020    3:00 PM 08/17/2020    8:22 PM 04/02/2017    9:11 AM 09/15/2011   10:17 AM  Advanced Directives  Does Patient Have a Medical Advance Directive?  Yes Yes Yes No Yes Patient has advance directive, copy not in chart  Type of Advance Directive Living will;Healthcare Power of [place];Living will [place];Living will Living will;Healthcare Power of [place];Living will Living will  Does patient want to make changes to medical advance directive?    No - Patient declined     Copy of [place] in Chart?  No - copy requested  Yes - validated most recent copy scanned in chart (See row information)  Yes Copy requested from other (Comment)  Would patient like information on creating a medical advance directive?    No - Patient declined No - Patient declined      Current Medications (verified) Outpatient Encounter Medications as of 10/15/2021  Medication Sig   allopurinol (ZYLOPRIM) 300 MG tablet TAKE 1 TABLET BY MOUTH ONCE DAILY FOR GOUT.   Alpha Lipoic Acid 200 MG CAPS Take 200 mg by mouth daily.    aspirin EC 81 MG tablet Take 81 mg by mouth daily.   chlorthalidone (HYGROTON) 25 MG tablet TAKE (1/2)  TABLET BY MOUTH ONCE DAILY.   Coenzyme Q10 (CO Q-10) 100 MG CAPS Take 100 mg by mouth daily.    ezetimibe (ZETIA) 10 MG tablet TAKE 1 TABLET BY MOUTH ONCE A DAY.   Green Tea 150 MG CAPS Take 150 mg by mouth daily.    lisinopril (ZESTRIL) 20 MG tablet TAKE ONE TABLET BY MOUTH ONCE DAILY.   metoprolol succinate (TOPROL-XL) 25 MG 24 hr tablet TAKE 1 TABLET BY MOUTH ONCE DAILY.   nitroGLYCERIN (NITROSTAT) 0.4 MG SL tablet PLACE 1 TAB UNDER TONGUE EVERY 5 MIN IF NEEDED FOR CHEST PAIN. MAY USE 3 TIMES.NO RELIEF CALL 911.   rosuvastatin (CRESTOR) 40 MG tablet TAKE (1) TABLET BY MOUTH ONCE A DAY.   sildenafil (VIAGRA) 100 MG tablet TAKE (1) TABLET BY MOUTH AS NEEDED FOR ERECTILE DYSFUNCTION. (Patient taking differently: Take 50 mg by mouth as needed for erectile dysfunction. Marland Kitchen)   traZODone (DESYREL) 50 MG tablet Take 0.5-1 tablets (25-50 mg total) by mouth at bedtime as needed for sleep. (Patient taking differently: Take 25 mg by mouth at bedtime as needed for sleep.)   Turmeric 500 MG CAPS Take 500 mg by mouth daily.    vitamin B-12 (CYANOCOBALAMIN) 500 MCG tablet Take 1 tablet (500 mcg total) by mouth daily.   vitamin C (ASCORBIC ACID) 500 MG tablet Take 500 mg by mouth  daily.   No facility-administered encounter medications on file as of 10/15/2021.    Allergies (verified) Flonase [fluticasone propionate]   History: Past Medical History:  Diagnosis Date   Acute diverticulitis 08/17/2020   Acute MI ([place]) 08/2011   Arthritis    Bilateral pneumonia    Diagnosed after STEMI 08/2011   CAD (coronary artery disease)    a. Diagnosed 08/2011 with anterior STEMI due to thrombotic occlusion of mid LAD s/p thrombectomy, PTCA, DES placement 08/23/11.   CHF (congestive heart failure) (HCC)    Dyslipidemia    Gout    Hypertension    Ischemic cardiomyopathy    a. Initial EF 35% by cath 08/23/11, improved to 40-45% by echo 08/25/11. 50-55% by echo November 2013.   Shortness of breath    Past Surgical History:   Procedure Laterality Date   carpel tunnel Bilateral 2002   COLONOSCOPY WITH PROPOFOL N/A 04/08/2017   Surgeon: Daneil Dolin, MD;  diverticulosis in sigmoid and descending colon, internal hemorrhoids, otherwise normal exam.  No recommendations to repeat due to age.   COLONOSCOPY WITH PROPOFOL N/A 03/17/2021   Procedure: COLONOSCOPY WITH PROPOFOL;  Surgeon: Eloise Harman, DO;  [facility];  Service: Endoscopy;  Laterality: N/A;  8:00am   CORONARY STENT PLACEMENT     CYSTECTOMY  1969   pilonidal cyst   LEFT AND RIGHT HEART CATHETERIZATION WITH CORONARY ANGIOGRAM N/A 09/15/2011   Procedure: LEFT AND RIGHT HEART CATHETERIZATION WITH CORONARY ANGIOGRAM;  Surgeon: Hillary Bow, MD;  Location: [facility] CATH LAB;  Service: Cardiovascular;  Laterality: N/A;   LEFT HEART CATH N/A 08/23/2011   Procedure: LEFT HEART CATH;  Surgeon: Wellington Hampshire, MD;  Location: [place] CATH LAB;  Service: Cardiovascular;  Laterality: N/A;   NO PAST SURGERIES     PERCUTANEOUS CORONARY STENT INTERVENTION (PCI-S)  08/23/2011   Procedure: PERCUTANEOUS CORONARY STENT INTERVENTION (PCI-S);  Surgeon: Wellington Hampshire, MD;  Location: [facility] CATH LAB;  Service: Cardiovascular;;   Family History  Problem Relation Age of Onset   Stroke Mother    Hypertension Father    Colon cancer Neg Hx    Pancreatic cancer Neg Hx    Social History   Socioeconomic History   Marital status: Married    Spouse name: Not on file   Number of children: Not on file   Years of education: Not on file   Highest education level: Not on file  Occupational History   Occupation: Comptroller  Tobacco Use   Smoking status: Former    Packs/day: 1.00    Years: 25.00    Total pack years: 25.00    Types: Cigarettes    Quit date: 08/23/1979    Years since quitting: 42.1   Smokeless tobacco: Never  Vaping Use   Vaping Use: Never used  Substance and Sexual Activity   Alcohol use: Yes    Alcohol/week: 4.0 standard drinks of alcohol     Types: 2 Cans of beer, 2 Shots of liquor per week    Comment: 1-2 drinks on the weekends   Drug use: No   Sexual activity: Yes  Other Topics Concern   Not on file  Social History Narrative   Widower since 59,married for 10 years.Lives alone,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.Has a girlfriend.[employer].   Social Determinants of Health   Financial Resource Strain: Low Risk  (10/04/2020)   Overall Financial Resource Strain (CARDIA)    Difficulty of Paying Living Expenses: Not hard at  all  Food Insecurity: No Food Insecurity (10/04/2020)   Hunger Vital Sign    Worried About Running Out of Food in the Last Year: Never true    Ran Out of Food in the Last Year: Never true  Transportation Needs: No Transportation Needs (10/04/2020)   PRAPARE - Hydrologist (Medical): No    Lack of Transportation (Non-Medical): No  Physical Activity: Insufficiently Active (10/04/2020)   Exercise Vital Sign    Days of Exercise per Week: 6 days    Minutes of Exercise per Session: 20 min  Stress: No Stress Concern Present (10/04/2020)   [place]    Feeling of Stress : Not at all  Social Connections: Moderately Integrated (10/04/2020)   Social Connection and Isolation Panel [NHANES]    Frequency of Communication with Friends and Family: More than three times a week    Frequency of Social Gatherings with Friends and Family: More than three times a week    Attends Religious Services: Never    Marine scientist or Organizations: Yes    Attends Archivist Meetings: 1 to 4 times per year    Marital Status: Married    Tobacco Counseling Counseling given: Not Answered   Clinical Intake:                 Diabetic?no         Activities of Daily Living    03/13/2021    8:55 AM  In your present state of health, do you have any difficulty performing the following  activities:  Hearing? 0  Vision? 0  Dressing or bathing? 0    Patient Care Team: Lindell Spar, MD as PCP - General (Internal Medicine) Harl Bowie Alphonse Guild, MD as PCP - Cardiology (Cardiology) Verl Blalock, Marijo Conception, MD (Inactive) as Attending Physician (Cardiology) Daneil Dolin, MD as Consulting Physician (Gastroenterology)  Indicate any recent Medical Services you may have received from other than [facility] providers in the past year (date may be approximate).     Assessment:   This is a routine wellness examination for [place].  Hearing/Vision screen No results found.  Dietary issues and exercise activities discussed:     Goals Addressed   None    Depression Screen    07/18/2021    8:57 AM 01/27/2021    9:16 AM 01/21/2021    3:00 PM 10/04/2020    3:36 PM 09/26/2020   10:46 AM 12/11/2019   10:28 AM 05/17/2019   11:08 AM  PHQ 2/9 Scores  PHQ - 2 Score 0 0 0 0 0 0 0  PHQ- 9 Score      0     Fall Risk    07/18/2021    8:56 AM 01/27/2021    9:16 AM 01/21/2021    3:00 PM 10/04/2020    3:39 PM 09/26/2020   10:45 AM  [place] in the past year? 0 0 0 0 0  Number falls in past yr: 0 0 0 0 0  Injury with Fall? 0 0 0 0 0  Risk for fall due to : No Fall Risks No Fall Risks No Fall Risks No Fall Risks No Fall Risks  Follow up Falls evaluation completed Falls evaluation completed Falls evaluation completed Falls evaluation completed Falls evaluation completed    FALL RISK PREVENTION PERTAINING TO THE HOME:  Any stairs in or around the home? No  If  so, are there any without handrails? na Home free of loose throw rugs in walkways, pet beds, electrical cords, etc? Yes  Adequate lighting in your home to reduce risk of falls? Yes   ASSISTIVE DEVICES UTILIZED TO PREVENT FALLS:  Life alert? No  Use of a cane, walker or w/c? No  Grab bars in the bathroom? Yes  Shower chair or bench in shower? Yes  Elevated toilet seat or a handicapped toilet? No   TIMED UP AND GO:           10/04/2020    3:40 PM  6CIT Screen  What Year? 0 points  What month? 0 points  What time? 0 points  Count back from 20 0 points  Months in reverse 0 points  Repeat phrase 0 points  Total Score 0 points    Immunizations Immunization History  Administered Date(s) Administered   Fluad Quad(high Dose 65+) 12/11/2019, 09/26/2020   Influenza, High Dose Seasonal PF 09/22/2018   Influenza-Unspecified 10/19/2017   Moderna Sars-Covid-2 Vaccination 03/16/2019, 04/14/2019, 11/16/2019, 08/11/2020   Pneumococcal Conjugate-13 10/31/2013   Pneumococcal Polysaccharide-23 10/21/2011, 10/11/2017   Tdap 09/21/2012   Zoster, Live 07/28/2013    Up to date with TDAP and Flu vaccine  Flu Vaccine status: Up to date  Pneumococcal vaccine status: Up to date  Covid-19 vaccine status: Information provided on how to obtain vaccines.   Qualifies for Shingles Vaccine? Yes   Zostavax completed  Shingrix Completed?: No.    Education has been provided regarding the importance of this vaccine. Patient has been advised to call insurance company to determine out of pocket expense if they have not yet received this vaccine. Advised may also receive vaccine at local pharmacy or Health Dept. Verbalized acceptance and understanding.  Screening Tests Health Maintenance  Topic Date Due   Zoster Vaccines- Shingrix (1 of 2) Never done   COVID-19 Vaccine (5 - Moderna series) 10/06/2020   INFLUENZA VACCINE  08/19/2021   TETANUS/TDAP  09/22/2022   COLONOSCOPY (Pts 45-15yrs Insurance coverage will need to be confirmed)  03/18/2031   Pneumonia Vaccine 66+ Years old  Completed   Hepatitis C Screening  Completed   HPV VACCINES  Aged Out    Health Maintenance  Health Maintenance Due  Topic Date Due   Zoster Vaccines- Shingrix (1 of 2) Never done   COVID-19 Vaccine (5 - Moderna series) 10/06/2020   INFLUENZA VACCINE  08/19/2021    Up to date with colonoscopy done in  03/17/2021   Lung Cancer Screening: (Low Dose  CT Chest recommended if Age 47-80 years, 30 pack-year currently smoking OR have quit w/in 15years.) does not qualify.   Lung Cancer Screening Referral: na   Additional Screening:  Hepatitis C Screening: does not qualify; Completed   Vision Screening: Recommended annual ophthalmology exams for early detection of glaucoma and other disorders of the eye. Is the patient up to date with their annual eye exam?  No  Who is the provider or what is the name of the office in which the patient attends annual eye exams?  [facility] If pt is not established with a provider, would they like to be referred to a provider to establish care? No .   Dental Screening: Recommended annual dental exams for proper oral hygiene  Community Resource Referral / Chronic Care Management: CRR required this visit?  No   CCM required this visit?  No      Plan:     I have personally reviewed and  noted the following in the patient's chart:   Medical and social history Use of alcohol, tobacco or illicit drugs  Current medications and supplements including opioid prescriptions. Patient is not currently taking opioid prescriptions. Functional ability and status Nutritional status Physical activity Advanced directives List of other physicians Hospitalizations, surgeries, and ER visits in previous 12 months Vitals Screenings to include cognitive, depression, and falls Referrals and appointments  In addition, I have reviewed and discussed with patient certain preventive protocols, quality metrics, and best practice recommendations. A written personalized care plan for preventive services as well as general preventive health recommendations were provided to patient.     Renee Rival, FNP   10/15/2021   Nurse Notes:

## 2021-10-15 NOTE — Patient Instructions (Addendum)
  Austin Bright , Thank you for taking time to come for your Medicare Wellness Visit. I appreciate your ongoing commitment to your health goals. Please review the following plan we discussed and let me know if I can assist you in the future.   These are the goals we discussed:  Goals   None     This is a list of the screening recommended for you and due dates:  Health Maintenance  Topic Date Due   Zoster (Shingles) Vaccine (1 of 2) Never done   COVID-19 Vaccine (6 - Moderna series) 07/19/2021   Tetanus Vaccine  09/22/2022   Colon Cancer Screening  03/18/2031   Pneumonia Vaccine  Completed   Flu Shot  Completed   Hepatitis C Screening: USPSTF Recommendation to screen - Ages 18-79 yo.  Completed   HPV Vaccine  Aged Out

## 2021-10-17 NOTE — Addendum Note (Signed)
Addended by: Derryl Uher H on: 10/17/2021 01:36 PM   Modules accepted: Orders, Level of Service  

## 2021-10-29 ENCOUNTER — Other Ambulatory Visit: Payer: Self-pay | Admitting: Internal Medicine

## 2021-11-21 ENCOUNTER — Other Ambulatory Visit: Payer: Self-pay | Admitting: Internal Medicine

## 2021-12-19 ENCOUNTER — Other Ambulatory Visit: Payer: Self-pay | Admitting: Internal Medicine

## 2021-12-19 ENCOUNTER — Other Ambulatory Visit: Payer: Self-pay | Admitting: Cardiology

## 2021-12-30 ENCOUNTER — Ambulatory Visit: Payer: Medicare PPO | Attending: Cardiology | Admitting: Cardiology

## 2021-12-30 ENCOUNTER — Encounter: Payer: Self-pay | Admitting: Cardiology

## 2021-12-30 VITALS — BP 134/82 | HR 91 | Ht 70.0 in | Wt 210.0 lb

## 2021-12-30 DIAGNOSIS — I2510 Atherosclerotic heart disease of native coronary artery without angina pectoris: Secondary | ICD-10-CM

## 2021-12-30 DIAGNOSIS — E782 Mixed hyperlipidemia: Secondary | ICD-10-CM | POA: Diagnosis not present

## 2021-12-30 DIAGNOSIS — I10 Essential (primary) hypertension: Secondary | ICD-10-CM

## 2021-12-30 NOTE — Progress Notes (Signed)
Clinical Summary Austin Bright is a 76 y.o.male seen today for follow up of the following medical problems.      1. CAD - Prior anterior wall MI in 08/2011, received DES. LVEF in setting of MI 35% - echo 11/2011 LVEF 50-55%, grade II diastolic dysfunction.       - no recent chest pains, no SOB/DOE.  - compliant with meds   2. HTN  - home bp's 120s/70s   3. Hyperlipidemia - compliant with statin  06/2021 TC 123 TG 115 HDL 50 LDL 52   4. Diastolic dysfunction - grade II diastolic dysfunction by most recent echo 11/2011   -no recent edema.    5. Former tobacco - 2012 AAA [place] without aneurysm       SH: wife  passed May 2017, laryngeal cancer.  Works as Catering manager, working more Office manager.   Enjoying kayaking.  Past Medical History:  Diagnosis Date   Acute diverticulitis 08/17/2020   Acute MI (HCC) 08/2011   Arthritis    Bilateral pneumonia    Diagnosed after STEMI 08/2011   CAD (coronary artery disease)    a. Diagnosed 08/2011 with anterior STEMI due to thrombotic occlusion of mid LAD s/p thrombectomy, PTCA, DES placement 08/23/11.   CHF (congestive heart failure) (HCC)    Dyslipidemia    Gout    Hypertension    Ischemic cardiomyopathy    a. Initial EF 35% by cath 08/23/11, improved to 40-45% by echo 08/25/11. 50-55% by echo November 2013.   Shortness of breath      Allergies  Allergen Reactions   Flonase [Fluticasone Propionate] Other (See Comments)    Nose bleeds.     Current Outpatient Medications  Medication Sig Dispense Refill   allopurinol (ZYLOPRIM) 300 MG tablet TAKE 1 TABLET BY MOUTH ONCE DAILY FOR GOUT. 90 tablet 0   Alpha Lipoic Acid 200 MG CAPS Take 200 mg by mouth daily.      aspirin EC 81 MG tablet Take 81 mg by mouth daily.     chlorthalidone (HYGROTON) 25 MG tablet TAKE (1/2) TABLET BY MOUTH ONCE DAILY. 45 tablet 2   Coenzyme Q10 (CO Q-10) 100 MG CAPS Take 100 mg by mouth daily.      ezetimibe (ZETIA) 10 MG tablet TAKE 1 TABLET BY  MOUTH ONCE A DAY. 90 tablet 0   Green Tea 150 MG CAPS Take 150 mg by mouth daily.      lisinopril (ZESTRIL) 20 MG tablet TAKE ONE TABLET BY MOUTH ONCE DAILY. 90 tablet 0   metoprolol succinate (TOPROL-XL) 25 MG 24 hr tablet TAKE 1 TABLET BY MOUTH ONCE DAILY. 90 tablet 3   nitroGLYCERIN (NITROSTAT) 0.4 MG SL tablet PLACE 1 TAB UNDER TONGUE EVERY 5 MIN IF NEEDED FOR CHEST PAIN. MAY USE 3 TIMES.NO RELIEF CALL 911. 25 tablet 3   rosuvastatin (CRESTOR) 40 MG tablet TAKE (1) TABLET BY MOUTH ONCE A DAY. 90 tablet 0   sildenafil (VIAGRA) 100 MG tablet TAKE (1) TABLET BY MOUTH AS NEEDED FOR ERECTILE DYSFUNCTION. (Patient taking differently: Take 50 mg by mouth as needed for erectile dysfunction. Marland Kitchen) 30 tablet 0   traZODone (DESYREL) 50 MG tablet Take 0.5-1 tablets (25-50 mg total) by mouth at bedtime as needed for sleep. (Patient taking differently: Take 25 mg by mouth at bedtime as needed for sleep.) 30 tablet 3   Turmeric 500 MG CAPS Take 500 mg by mouth daily.      vitamin B-12 (  CYANOCOBALAMIN) 500 MCG tablet Take 1 tablet (500 mcg total) by mouth daily.     vitamin C (ASCORBIC ACID) 500 MG tablet Take 500 mg by mouth daily.     No current facility-administered medications for this visit.     Past Surgical History:  Procedure Laterality Date   carpel tunnel Bilateral 2002   COLONOSCOPY WITH PROPOFOL N/A 04/08/2017   Surgeon: Corbin Ade, MD;  diverticulosis in sigmoid and descending colon, internal hemorrhoids, otherwise normal exam.  No recommendations to repeat due to age.   COLONOSCOPY WITH PROPOFOL N/A 03/17/2021   Procedure: COLONOSCOPY WITH PROPOFOL;  Surgeon: Lanelle Bal, DO;  [facility];  Service: Endoscopy;  Laterality: N/A;  8:00am   CORONARY STENT PLACEMENT     CYSTECTOMY  1969   pilonidal cyst   LEFT AND RIGHT HEART CATHETERIZATION WITH CORONARY ANGIOGRAM N/A 09/15/2011   Procedure: LEFT AND RIGHT HEART CATHETERIZATION WITH CORONARY ANGIOGRAM;  Surgeon: Herby Abraham, MD;  Location: [facility] CATH LAB;  Service: Cardiovascular;  Laterality: N/A;   LEFT HEART CATH N/A 08/23/2011   Procedure: LEFT HEART CATH;  Surgeon: Iran Ouch, MD;  Location: [facility] CATH LAB;  Service: Cardiovascular;  Laterality: N/A;   NO PAST SURGERIES     PERCUTANEOUS CORONARY STENT INTERVENTION (PCI-S)  08/23/2011   Procedure: PERCUTANEOUS CORONARY STENT INTERVENTION (PCI-S);  Surgeon: Iran Ouch, MD;  Location: [facility] CATH LAB;  Service: Cardiovascular;;     Allergies  Allergen Reactions   Flonase [Fluticasone Propionate] Other (See Comments)    Nose bleeds.      Family History  Problem Relation Age of Onset   Stroke Mother    Hypertension Father    Colon cancer Neg Hx    Pancreatic cancer Neg Hx      Social History Austin Bright reports that he quit smoking about 42 years ago. His smoking use included cigarettes. He has a 25.00 pack-year smoking history. He has never used smokeless tobacco. Austin Bright reports current alcohol use of about 4.0 standard drinks of alcohol per week.   Review of Systems CONSTITUTIONAL: No weight loss, fever, chills, weakness or fatigue.  HEENT: Eyes: No visual loss, blurred vision, double vision or yellow sclerae.No hearing loss, sneezing, congestion, runny nose or sore throat.  SKIN: No rash or itching.  CARDIOVASCULAR: per hpi RESPIRATORY: No shortness of breath, cough or sputum.  GASTROINTESTINAL: No anorexia, nausea, vomiting or diarrhea. No abdominal pain or blood.  GENITOURINARY: No burning on urination, no polyuria NEUROLOGICAL: No headache, dizziness, syncope, paralysis, ataxia, numbness or tingling in the extremities. No change in bowel or bladder control.  MUSCULOSKELETAL: No muscle, back pain, joint pain or stiffness.  LYMPHATICS: No enlarged nodes. No history of splenectomy.  PSYCHIATRIC: No history of depression or anxiety.  ENDOCRINOLOGIC: No reports of sweating, cold or heat intolerance. No polyuria or polydipsia.   Marland Kitchen   Physical Examination Today's Vitals   12/30/21 1003  BP: 134/82  Pulse: 91  SpO2: 94%  Weight: 210 lb (95.3 kg)  Height: 5\' 10"  (1.778 m)   Body mass index is 30.13 kg/m.  Gen: resting comfortably, no acute distress HEENT: no scleral icterus, pupils equal round and reactive, no palptable cervical adenopathy,  CV: RRR, no mr/g, no jvd Resp: Clear to auscultation bilaterally GI: abdomen is soft, non-tender, non-distended, normal bowel sounds, no hepatosplenomegaly MSK: extremities are warm, no edema.  Skin: warm, no rash Neuro:  no focal deficits Psych: appropriate affect   Diagnostic  Studies   11/2011 Echo Study Conclusions  - Left ventricle: The cavity size was normal. Wall thickness was normal. Systolic function was normal. The estimated ejection fraction was in the range of 50% to 55%. There is hypokinesis of the mid-distalinferoseptal myocardium. There is hypokinesis of the distalanteroseptal myocardium. Features are consistent with a pseudonormal left ventricular filling pattern, with concomitant abnormal relaxation and increased filling pressure (grade 2 diastolic dysfunction). Doppler parameters are consistent with elevated ventricular end-diastolic filling pressure. - Aortic valve: Mildly calcified annulus. Trileaflet; mildly calcified leaflets. - Mitral valve: Trivial regurgitation. - Left atrium: The atrium was mildly dilated. - Right ventricle: The cavity size was mildly dilated. - Tricuspid valve: Trivial regurgitation. - Pulmonary arteries: PA peak pressure: 64mm Hg (S). - Pericardium, extracardiac: There was no pericardial effusion.    Assessment and Plan   1. CAD - no symptoms, continue current meds - EKG shows SR, anterior Qwaves, no acute ischemic changes     2. HTN - at goal, continue current meds   3. Hyperlipidemia - at goal, continue current meds  F/u 1 year      Antoine Poche, M.D.

## 2021-12-30 NOTE — Patient Instructions (Signed)
Medication Instructions:  Your physician recommends that you continue on your current medications as directed. Please refer to the Current Medication list given to you today.   Labwork: None  Testing/Procedures: None  Follow-Up: Follow up with Dr. Branch in 1 year.   Any Other Special Instructions Will Be Listed Below (If Applicable).     If you need a refill on your cardiac medications before your next appointment, please call your pharmacy.  

## 2022-01-05 ENCOUNTER — Other Ambulatory Visit: Payer: Self-pay | Admitting: Cardiology

## 2022-01-07 ENCOUNTER — Encounter: Payer: Self-pay | Admitting: Internal Medicine

## 2022-01-13 ENCOUNTER — Other Ambulatory Visit: Payer: Self-pay | Admitting: Cardiology

## 2022-01-23 ENCOUNTER — Encounter: Payer: Self-pay | Admitting: Internal Medicine

## 2022-01-23 ENCOUNTER — Ambulatory Visit: Payer: Medicare PPO | Admitting: Internal Medicine

## 2022-01-23 VITALS — BP 132/80 | HR 76 | Ht 70.0 in | Wt 217.2 lb

## 2022-01-23 DIAGNOSIS — F5101 Primary insomnia: Secondary | ICD-10-CM | POA: Diagnosis not present

## 2022-01-23 DIAGNOSIS — I10 Essential (primary) hypertension: Secondary | ICD-10-CM | POA: Diagnosis not present

## 2022-01-23 DIAGNOSIS — M1000 Idiopathic gout, unspecified site: Secondary | ICD-10-CM | POA: Diagnosis not present

## 2022-01-23 DIAGNOSIS — E782 Mixed hyperlipidemia: Secondary | ICD-10-CM

## 2022-01-23 DIAGNOSIS — I2510 Atherosclerotic heart disease of native coronary artery without angina pectoris: Secondary | ICD-10-CM

## 2022-01-23 DIAGNOSIS — R7303 Prediabetes: Secondary | ICD-10-CM

## 2022-01-23 MED ORDER — ROSUVASTATIN CALCIUM 40 MG PO TABS: 40.0000 mg | ORAL_TABLET | Freq: Every day | ORAL | 3 refills | Status: DC

## 2022-01-23 MED ORDER — LISINOPRIL 20 MG PO TABS: 20.0000 mg | ORAL_TABLET | Freq: Every day | ORAL | 3 refills | Status: DC

## 2022-01-23 NOTE — Progress Notes (Signed)
Established Patient Office Visit  Subjective:  Patient ID: Austin Bright, male    DOB: 16-Jul-1945  Age: 77 y.o. MRN: [id number]  CC:  Chief Complaint  Patient presents with   Hypertension    Follow up   Coronary Artery Disease    Follow up    HPI Austin Bright is a 77 y.o. male with past medical history of CAD s/p stent placement, HTN, HLD, gout and diverticulitis who presents for f/u of his chronic medical conditions.  HTN and CAD: BP is well-controlled. Takes medications regularly. Patient denies headache, dizziness, chest pain, dyspnea or palpitations.  He follows up with cardiology.  He has a history of stent placement, and takes aspirin and statin.  He also takes metoprolol and lisinopril.  He reports deep difficulty maintaining sleep.  He has tried trazodone 25 mg dose only.  Denies any spells of anxiety, anhedonia, SI or HI.   Past Medical History:  Diagnosis Date   Acute diverticulitis 08/17/2020   Acute MI ([place]) 08/2011   Arthritis    Bilateral pneumonia    Diagnosed after STEMI 08/2011   CAD (coronary artery disease)    a. Diagnosed 08/2011 with anterior STEMI due to thrombotic occlusion of mid LAD s/p thrombectomy, PTCA, DES placement 08/23/11.   CHF (congestive heart failure) (HCC)    Chronic combined systolic and diastolic congestive heart failure ([place]) 09/15/2011   Dyslipidemia    Gout    Hypertension    Ischemic cardiomyopathy    a. Initial EF 35% by cath 08/23/11, improved to 40-45% by echo 08/25/11. 50-55% by echo November 2013.   Shortness of breath     Past Surgical History:  Procedure Laterality Date   carpel tunnel Bilateral 2002   COLONOSCOPY WITH PROPOFOL N/A 04/08/2017   Surgeon: Daneil Dolin, MD;  diverticulosis in sigmoid and descending colon, internal hemorrhoids, otherwise normal exam.  No recommendations to repeat due to age.   COLONOSCOPY WITH PROPOFOL N/A 03/17/2021   Procedure: COLONOSCOPY WITH PROPOFOL;  Surgeon: Eloise Harman,  DO;  Location: [facility];  Service: Endoscopy;  Laterality: N/A;  8:00am   CORONARY STENT PLACEMENT     CYSTECTOMY  1969   pilonidal cyst   LEFT AND RIGHT HEART CATHETERIZATION WITH CORONARY ANGIOGRAM N/A 09/15/2011   Procedure: LEFT AND RIGHT HEART CATHETERIZATION WITH CORONARY ANGIOGRAM;  Surgeon: Hillary Bow, MD;  Location: [facility] CATH LAB;  Service: Cardiovascular;  Laterality: N/A;   LEFT HEART CATH N/A 08/23/2011   Procedure: LEFT HEART CATH;  Surgeon: Wellington Hampshire, MD;  Location: [facility] CATH LAB;  Service: Cardiovascular;  Laterality: N/A;   NO PAST SURGERIES     PERCUTANEOUS CORONARY STENT INTERVENTION (PCI-S)  08/23/2011   Procedure: PERCUTANEOUS CORONARY STENT INTERVENTION (PCI-S);  Surgeon: Wellington Hampshire, MD;  Location: [facility] CATH LAB;  Service: Cardiovascular;;    Family History  Problem Relation Age of Onset   Stroke Mother    Hypertension Father    Colon cancer Neg Hx    Pancreatic cancer Neg Hx     Social History   Socioeconomic History   Marital status: Married    Spouse name: Not on file   Number of children: Not on file   Years of education: Not on file   Highest education level: Not on file  Occupational History   Occupation: Comptroller  Tobacco Use   Smoking status: Former    Packs/day: 1.00    Years: 25.00  Total pack years: 25.00    Types: Cigarettes    Quit date: 08/23/1979    Years since quitting: 42.4   Smokeless tobacco: Never  Vaping Use   Vaping Use: Never used  Substance and Sexual Activity   Alcohol use: Yes    Alcohol/week: 4.0 standard drinks of alcohol    Types: 2 Cans of beer, 2 Shots of liquor per week    Comment: 1-2 drinks on the weekends   Drug use: No   Sexual activity: Yes  Other Topics Concern   Not on file  Social History Narrative   Widower since 19,married for 10 years.Lives alone,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.Has a girlfriend.[employer].   Social Determinants of Health   Financial  Resource Strain: Low Risk  (10/15/2021)   Overall Financial Resource Strain (CARDIA)    Difficulty of Paying Living Expenses: Not hard at all  Food Insecurity: No Food Insecurity (10/15/2021)   Hunger Vital Sign    Worried About Running Out of Food in the Last Year: Never true    Ran Out of Food in the Last Year: Never true  Transportation Needs: No Transportation Needs (10/15/2021)   PRAPARE - Hydrologist (Medical): No    Lack of Transportation (Non-Medical): No  Physical Activity: Insufficiently Active (10/15/2021)   Exercise Vital Sign    Days of Exercise per Week: 4 days    Minutes of Exercise per Session: 30 min  Stress: No Stress Concern Present (10/15/2021)   [place]    Feeling of Stress : Not at all  Social Connections: Moderately Integrated (10/15/2021)   Social Connection and Isolation Panel [NHANES]    Frequency of Communication with Friends and Family: Three times a week    Frequency of Social Gatherings with Friends and Family: More than three times a week    Attends Religious Services: Never    Marine scientist or Organizations: Yes    Attends Music therapist: More than 4 times per year    Marital Status: Married  Human resources officer Violence: Not At Risk (10/15/2021)   Humiliation, Afraid, Rape, and Kick questionnaire    Fear of Current or Ex-Partner: No    Emotionally Abused: No    Physically Abused: No    Sexually Abused: No    Outpatient Medications Prior to Visit  Medication Sig Dispense Refill   allopurinol (ZYLOPRIM) 300 MG tablet TAKE 1 TABLET BY MOUTH ONCE DAILY FOR GOUT. 90 tablet 0   Alpha Lipoic Acid 200 MG CAPS Take 200 mg by mouth daily.      aspirin EC 81 MG tablet Take 81 mg by mouth daily.     chlorthalidone (HYGROTON) 25 MG tablet TAKE (1/2) TABLET BY MOUTH ONCE DAILY. 45 tablet 2   Coenzyme Q10 (CO Q-10) 100 MG CAPS Take 100 mg by mouth  daily.      ezetimibe (ZETIA) 10 MG tablet TAKE 1 TABLET BY MOUTH ONCE A DAY. 90 tablet 3   Green Tea 150 MG CAPS Take 150 mg by mouth daily.      metoprolol succinate (TOPROL-XL) 25 MG 24 hr tablet TAKE 1 TABLET BY MOUTH ONCE DAILY. 90 tablet 3   nitroGLYCERIN (NITROSTAT) 0.4 MG SL tablet PLACE 1 TAB UNDER TONGUE EVERY 5 MIN IF NEEDED FOR CHEST PAIN. MAY USE 3 TIMES.NO RELIEF CALL 911. 25 tablet 3   sildenafil (VIAGRA) 100 MG tablet TAKE (1) TABLET  BY MOUTH AS NEEDED FOR ERECTILE DYSFUNCTION. (Patient taking differently: Take 50 mg by mouth as needed for erectile dysfunction. Marland Kitchen) 30 tablet 0   traZODone (DESYREL) 50 MG tablet Take 0.5-1 tablets (25-50 mg total) by mouth at bedtime as needed for sleep. (Patient taking differently: Take 25 mg by mouth at bedtime as needed for sleep.) 30 tablet 3   Turmeric 500 MG CAPS Take 500 mg by mouth daily.      vitamin B-12 (CYANOCOBALAMIN) 500 MCG tablet Take 1 tablet (500 mcg total) by mouth daily.     vitamin C (ASCORBIC ACID) 500 MG tablet Take 500 mg by mouth daily.     lisinopril (ZESTRIL) 20 MG tablet TAKE ONE TABLET BY MOUTH ONCE DAILY. 90 tablet 0   rosuvastatin (CRESTOR) 40 MG tablet TAKE (1) TABLET BY MOUTH ONCE A DAY. 90 tablet 0   No facility-administered medications prior to visit.    Allergies  Allergen Reactions   Flonase [Fluticasone Propionate] Other (See Comments)    Nose bleeds.    ROS Review of Systems  Constitutional:  Negative for chills and fever.  HENT:  Negative for congestion and sore throat.   Eyes:  Negative for pain and discharge.  Respiratory:  Negative for cough and shortness of breath.   Cardiovascular:  Negative for chest pain and palpitations.  Gastrointestinal:  Negative for constipation, diarrhea, nausea and vomiting.  Endocrine: Negative for polydipsia and polyuria.  Genitourinary:  Negative for dysuria and hematuria.  Musculoskeletal:  Negative for neck pain and neck stiffness.  Skin:  Negative for rash.   Neurological:  Negative for dizziness, weakness, numbness and headaches.  Psychiatric/Behavioral:  Positive for sleep disturbance. Negative for agitation and behavioral problems.       Objective:    Physical Exam Vitals reviewed.  Constitutional:      General: He is not in acute distress.    Appearance: He is not diaphoretic.  HENT:     Head: Normocephalic and atraumatic.     Nose: Nose normal.     Mouth/Throat:     Mouth: Mucous membranes are moist.  Eyes:     General: No scleral icterus.    Extraocular Movements: Extraocular movements intact.  Cardiovascular:     Rate and Rhythm: Normal rate and regular rhythm.     Pulses: Normal pulses.     Heart sounds: Normal heart sounds. No murmur heard. Pulmonary:     Breath sounds: Normal breath sounds. No wheezing or rales.  Musculoskeletal:     Cervical back: Neck supple. No tenderness.     Right lower leg: No edema.     Left lower leg: No edema.  Skin:    General: Skin is warm.     Findings: No rash.  Neurological:     General: No focal deficit present.     Mental Status: He is alert and oriented to person, place, and time.     Sensory: No sensory deficit.     Motor: No weakness.  Psychiatric:        Mood and Affect: Mood normal.        Behavior: Behavior normal.     BP 132/80   Pulse 76   Ht 5\' 10"  (1.778 m)   Wt 217 lb 3.2 oz (98.5 kg)   SpO2 95%   BMI 31.16 kg/m  Wt Readings from Last 3 Encounters:  01/23/22 217 lb 3.2 oz (98.5 kg)  12/30/21 210 lb (95.3 kg)  07/18/21 215 lb 9.6 oz (  97.8 kg)    Lab Results  Component Value Date   TSH 1.250 07/16/2021   Lab Results  Component Value Date   WBC 7.3 07/16/2021   HGB 16.4 07/16/2021   HCT 47.5 07/16/2021   MCV 96 07/16/2021   PLT 165 07/16/2021   Lab Results  Component Value Date   NA 140 07/16/2021   K 4.2 07/16/2021   CO2 23 07/16/2021   GLUCOSE 103 (H) 07/16/2021   BUN 17 07/16/2021   CREATININE 1.06 07/16/2021   BILITOT 0.5 07/16/2021    ALKPHOS 70 07/16/2021   AST 33 07/16/2021   ALT 40 07/16/2021   PROT 6.9 07/16/2021   ALBUMIN 4.7 07/16/2021   CALCIUM 8.4 (L) 07/16/2021   ANIONGAP 11 08/19/2020   EGFR 74 07/16/2021   Lab Results  Component Value Date   CHOL 123 07/16/2021   Lab Results  Component Value Date   HDL 50 07/16/2021   Lab Results  Component Value Date   LDLCALC 52 07/16/2021   Lab Results  Component Value Date   TRIG 115 07/16/2021   Lab Results  Component Value Date   CHOLHDL 2.5 07/16/2021   Lab Results  Component Value Date   HGBA1C 5.7 (H) 07/16/2021      Assessment & Plan:   Problem List Items Addressed This Visit       Cardiovascular and Mediastinum   Coronary artery disease involving native coronary artery of native heart without angina pectoris - Primary    On Aspirin and statin On Metoprolol and Lisinopril Had ischemic CM, but LVEF has improved now. Followed by Cardiology      Relevant Medications   lisinopril (ZESTRIL) 20 MG tablet   rosuvastatin (CRESTOR) 40 MG tablet   Essential hypertension    BP Readings from Last 1 Encounters:  01/23/22 132/80  Well-controlled with Lisinopril, Chlorthalidone and Metoprolol Counseled for compliance with the medications Advised DASH diet and moderate exercise/walking, at least 150 mins/week      Relevant Medications   lisinopril (ZESTRIL) 20 MG tablet   rosuvastatin (CRESTOR) 40 MG tablet   Other Relevant Orders   Basic Metabolic Panel (BMET)     Other   Idiopathic gout    On Allopurinol 300 mg QD No recent flares Check uric acid level, plan to discontinue allopurinol      Relevant Orders   Uric acid   Primary insomnia    Has tried Melatonin and ZQuil with no relief Sleep hygiene discussed Had started Trazodone 25-50 mg qHS PRN, but still has insomnia -he has tried only 25 mg nightly yet, advised to take 50 mg qHS      Prediabetes    Lab Results  Component Value Date   HGBA1C 5.7 (H) 07/16/2021  Advised to  follow DASH diet for now      Relevant Orders   Basic Metabolic Panel (BMET)   Hemoglobin A1c   Mixed hyperlipidemia    On Crestor and Zetia      Relevant Medications   lisinopril (ZESTRIL) 20 MG tablet   rosuvastatin (CRESTOR) 40 MG tablet   Other Relevant Orders   Lipid Profile    Meds ordered this encounter  Medications   lisinopril (ZESTRIL) 20 MG tablet    Sig: Take 1 tablet (20 mg total) by mouth daily.    Dispense:  90 tablet    Refill:  3   rosuvastatin (CRESTOR) 40 MG tablet    Sig: Take 1 tablet (40 mg total) by  mouth daily.    Dispense:  90 tablet    Refill:  3    Follow-up: Return in about 6 months (around 07/24/2022) for Annual physical.    Anabel Halon, MD

## 2022-01-23 NOTE — Patient Instructions (Signed)
Please continue taking medications as prescribed.  Please continue to follow low salt diet and ambulate as tolerated.  Please consider getting Shingrix vaccine at local pharmacy. 

## 2022-01-23 NOTE — Assessment & Plan Note (Signed)
On Crestor and Zetia 

## 2022-01-23 NOTE — Assessment & Plan Note (Signed)
BP Readings from Last 1 Encounters:  01/23/22 132/80   Well-controlled with Lisinopril, Chlorthalidone and Metoprolol Counseled for compliance with the medications Advised DASH diet and moderate exercise/walking, at least 150 mins/week

## 2022-01-23 NOTE — Assessment & Plan Note (Signed)
Lab Results  Component Value Date   HGBA1C 5.7 (H) 07/16/2021   Advised to follow DASH diet for now

## 2022-01-23 NOTE — Assessment & Plan Note (Signed)
On Aspirin and statin On Metoprolol and Lisinopril Had ischemic CM, but LVEF has improved now. Followed by Cardiology

## 2022-01-23 NOTE — Assessment & Plan Note (Signed)
Has tried Melatonin and ZQuil with no relief Sleep hygiene discussed Had started Trazodone 25-50 mg qHS PRN, but still has insomnia -he has tried only 25 mg nightly yet, advised to take 50 mg qHS

## 2022-01-23 NOTE — Assessment & Plan Note (Signed)
On Allopurinol 300 mg QD No recent flares Check uric acid level, plan to discontinue allopurinol

## 2022-01-24 LAB — BASIC METABOLIC PANEL
BUN/Creatinine Ratio: 14 (ref 10–24)
BUN: 15 mg/dL (ref 8–27)
CO2: 26 mmol/L (ref 20–29)
Calcium: 9.5 mg/dL (ref 8.6–10.2)
Chloride: 100 mmol/L (ref 96–106)
Creatinine, Ser: 1.08 mg/dL (ref 0.76–1.27)
Glucose: 115 mg/dL — ABNORMAL HIGH (ref 70–99)
Potassium: 4.3 mmol/L (ref 3.5–5.2)
Sodium: 142 mmol/L (ref 134–144)
eGFR: 72 mL/min/{1.73_m2} (ref >59)

## 2022-01-24 LAB — LIPID PANEL
Chol/HDL Ratio: 2.5 ratio (ref 0.0–5.0)
Cholesterol, Total: 114 mg/dL (ref 100–199)
HDL: 46 mg/dL (ref >39)
LDL Chol Calc (NIH): 51 mg/dL (ref 0–99)
Triglycerides: 88 mg/dL (ref 0–149)
VLDL Cholesterol Cal: 17 mg/dL (ref 5–40)

## 2022-01-24 LAB — HEMOGLOBIN A1C
Est. average glucose Bld gHb Est-mCnc: 128 mg/dL
Hgb A1c MFr Bld: 6.1 % — ABNORMAL HIGH (ref 4.8–5.6)

## 2022-01-24 LAB — URIC ACID: Uric Acid: 4.7 mg/dL (ref 3.8–8.4)

## 2022-01-26 ENCOUNTER — Other Ambulatory Visit: Payer: Self-pay | Admitting: Internal Medicine

## 2022-01-26 ENCOUNTER — Telehealth: Payer: Self-pay | Admitting: Internal Medicine

## 2022-01-26 NOTE — Telephone Encounter (Signed)
Pt returning call

## 2022-01-26 NOTE — Telephone Encounter (Signed)
Spoke to patient

## 2022-01-31 ENCOUNTER — Other Ambulatory Visit: Payer: Self-pay | Admitting: Cardiology

## 2022-04-22 ENCOUNTER — Other Ambulatory Visit: Payer: Self-pay | Admitting: Cardiology

## 2022-05-13 DIAGNOSIS — L718 Other rosacea: Secondary | ICD-10-CM | POA: Diagnosis not present

## 2022-06-23 ENCOUNTER — Encounter: Payer: Self-pay | Admitting: Cardiology

## 2022-06-23 ENCOUNTER — Other Ambulatory Visit: Payer: Self-pay

## 2022-06-23 MED ORDER — EZETIMIBE 10 MG PO TABS: 10.0000 mg | ORAL_TABLET | Freq: Every day | ORAL | 3 refills | Status: DC

## 2022-08-03 ENCOUNTER — Encounter: Payer: Self-pay | Admitting: Internal Medicine

## 2022-08-03 ENCOUNTER — Ambulatory Visit (INDEPENDENT_AMBULATORY_CARE_PROVIDER_SITE_OTHER): Payer: Medicare PPO | Admitting: Internal Medicine

## 2022-08-03 VITALS — BP 111/70 | HR 78 | Ht 70.0 in | Wt 217.8 lb

## 2022-08-03 DIAGNOSIS — I2510 Atherosclerotic heart disease of native coronary artery without angina pectoris: Secondary | ICD-10-CM

## 2022-08-03 DIAGNOSIS — L03115 Cellulitis of right lower limb: Secondary | ICD-10-CM | POA: Diagnosis not present

## 2022-08-03 DIAGNOSIS — E559 Vitamin D deficiency, unspecified: Secondary | ICD-10-CM

## 2022-08-03 DIAGNOSIS — Z125 Encounter for screening for malignant neoplasm of prostate: Secondary | ICD-10-CM

## 2022-08-03 DIAGNOSIS — I10 Essential (primary) hypertension: Secondary | ICD-10-CM

## 2022-08-03 DIAGNOSIS — R7303 Prediabetes: Secondary | ICD-10-CM | POA: Diagnosis not present

## 2022-08-03 DIAGNOSIS — Z0001 Encounter for general adult medical examination with abnormal findings: Secondary | ICD-10-CM

## 2022-08-03 DIAGNOSIS — E782 Mixed hyperlipidemia: Secondary | ICD-10-CM

## 2022-08-03 MED ORDER — DOXYCYCLINE HYCLATE 100 MG PO TABS: 100.0000 mg | ORAL_TABLET | Freq: Two times a day (BID) | ORAL | 0 refills | Status: DC

## 2022-08-03 NOTE — Assessment & Plan Note (Signed)

## 2022-08-03 NOTE — Assessment & Plan Note (Signed)
Local erythema and warmth without fluctuant area likely cellulitis Started doxycycline (MRSA coverage) Continue local care with Neosporin

## 2022-08-03 NOTE — Assessment & Plan Note (Addendum)
Ordered PSA after discussing its limitations for prostate cancer screening, including false positive results leading to additional investigations. 

## 2022-08-03 NOTE — Patient Instructions (Addendum)
Please start taking Doxycycline as prescribed.  Please continue to take medications as prescribed.  Please continue to follow low carb diet and perform moderate exercise/walking at least 150 mins/week.  Please get blood tests done after 1 week.

## 2022-08-03 NOTE — Assessment & Plan Note (Signed)
On Aspirin, statin and Zetia On Metoprolol and Lisinopril Had ischemic CM, but LVEF has improved now. Followed by Cardiology

## 2022-08-03 NOTE — Assessment & Plan Note (Signed)
BP Readings from Last 1 Encounters:  08/03/22 111/70   Well-controlled with Lisinopril, Chlorthalidone and Metoprolol Counseled for compliance with the medications Advised DASH diet and moderate exercise/walking, at least 150 mins/week

## 2022-08-03 NOTE — Progress Notes (Signed)
Established Patient Office Visit  Subjective:  Patient ID: Austin Bright, male    DOB: 12/11/1945  Age: 77 y.o. MRN: [id number]  CC:  Chief Complaint  Patient presents with   Annual Exam   ingrown hair    Patient has an ingrown hair on his right leg that he believes is infected    HPI Austin Bright is a 77 y.o. male with past medical history of CAD s/p stent placement, HTN, HLD, gout and diverticulitis who presents for annual physical.  HTN and CAD: BP is well-controlled. Takes medications regularly. Patient denies headache, dizziness, chest pain, dyspnea or palpitations.  He follows up with cardiology.  He has a history of stent placement, and takes aspirin and statin.  He also takes metoprolol and lisinopril.  He has redness, warmth and pain or right leg for the last 1 week.  He had scratched the area while he was asleep before this red spot appeared.  Does not report any recent insect bite.  Denies any fever or chills.  He has noticed local pus discharge and has been cleaning it with alcohol and applying Neosporin over it.     Past Medical History:  Diagnosis Date   Acute diverticulitis 08/17/2020   Acute MI (HCC) 08/2011   Arthritis    Bilateral pneumonia    Diagnosed after STEMI 08/2011   CAD (coronary artery disease)    a. Diagnosed 08/2011 with anterior STEMI due to thrombotic occlusion of mid LAD s/p thrombectomy, PTCA, DES placement 08/23/11.   CHF (congestive heart failure) (HCC)    Chronic combined systolic and diastolic congestive heart failure (HCC) 09/15/2011   Dyslipidemia    Gout    Hypertension    Ischemic cardiomyopathy    a. Initial EF 35% by cath 08/23/11, improved to 40-45% by echo 08/25/11. 50-55% by echo November 2013.   Shortness of breath     Past Surgical History:  Procedure Laterality Date   carpel tunnel Bilateral 2002   COLONOSCOPY WITH PROPOFOL N/A 04/08/2017   Surgeon: Corbin Ade, MD;  diverticulosis in sigmoid and descending  colon, internal hemorrhoids, otherwise normal exam.  No recommendations to repeat due to age.   COLONOSCOPY WITH PROPOFOL N/A 03/17/2021   Procedure: COLONOSCOPY WITH PROPOFOL;  Surgeon: Lanelle Bal, DO;  Location: [facility];  Service: Endoscopy;  Laterality: N/A;  8:00am   CORONARY STENT PLACEMENT     CYSTECTOMY  1969   pilonidal cyst   LEFT AND RIGHT HEART CATHETERIZATION WITH CORONARY ANGIOGRAM N/A 09/15/2011   Procedure: LEFT AND RIGHT HEART CATHETERIZATION WITH CORONARY ANGIOGRAM;  Surgeon: Herby Abraham, MD;  Location: [facility] CATH LAB;  Service: Cardiovascular;  Laterality: N/A;   LEFT HEART CATH N/A 08/23/2011   Procedure: LEFT HEART CATH;  Surgeon: Iran Ouch, MD;  Location: [facility] CATH LAB;  Service: Cardiovascular;  Laterality: N/A;   NO PAST SURGERIES     PERCUTANEOUS CORONARY STENT INTERVENTION (PCI-S)  08/23/2011   Procedure: PERCUTANEOUS CORONARY STENT INTERVENTION (PCI-S);  Surgeon: Iran Ouch, MD;  Location: [facility] CATH LAB;  Service: Cardiovascular;;    Family History  Problem Relation Age of Onset   Stroke Mother    Hypertension Father    Colon cancer Neg Hx    Pancreatic cancer Neg Hx     Social History   Socioeconomic History   Marital status: Married    Spouse name: Not on file   Number of children: Not on file  Years of education: Not on file   Highest education level: Not on file  Occupational History   Occupation: Child psychotherapist  Tobacco Use   Smoking status: Former    Current packs/day: 0.00    Average packs/day: 1 pack/day for 25.0 years (25.0 ttl pk-yrs)    Types: Cigarettes    Start date: 08/23/1954    Quit date: 08/23/1979    Years since quitting: 42.9   Smokeless tobacco: Never  Vaping Use   Vaping status: Never Used  Substance and Sexual Activity   Alcohol use: Yes    Alcohol/week: 4.0 standard drinks of alcohol    Types: 2 Cans of beer, 2 Shots of liquor per week    Comment: 1-2 drinks on the weekends   Drug use: No   Sexual  activity: Yes  Other Topics Concern   Not on file  Social History Narrative   Widower since 78,married for 10 years.Lives alone,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   Social Determinants of Health   Financial Resource Strain: Low Risk  (10/15/2021)   Overall Financial Resource Strain (CARDIA)    Difficulty of Paying Living Expenses: Not hard at all  Food Insecurity: No Food Insecurity (10/15/2021)   Hunger Vital Sign    Worried About Running Out of Food in the Last Year: Never true    Ran Out of Food in the Last Year: Never true  Transportation Needs: No Transportation Needs (10/15/2021)   PRAPARE - Administrator, Civil Service (Medical): No    Lack of Transportation (Non-Medical): No  Physical Activity: Insufficiently Active (10/15/2021)   Exercise Vital Sign    Days of Exercise per Week: 4 days    Minutes of Exercise per Session: 30 min  Stress: No Stress Concern Present (10/15/2021)   [employer] of Occupational Health - Occupational Stress Questionnaire    Feeling of Stress : Not at all  Social Connections: Moderately Integrated (10/15/2021)   Social Connection and Isolation Panel [NHANES]    Frequency of Communication with Friends and Family: Three times a week    Frequency of Social Gatherings with Friends and Family: More than three times a week    Attends Religious Services: Never    Database administrator or Organizations: Yes    Attends Engineer, structural: More than 4 times per year    Marital Status: Married  Catering manager Violence: Not At Risk (10/15/2021)   Humiliation, Afraid, Rape, and Kick questionnaire    Fear of Current or Ex-Partner: No    Emotionally Abused: No    Physically Abused: No    Sexually Abused: No    Outpatient Medications Prior to Visit  Medication Sig Dispense Refill   Alpha Lipoic Acid 200 MG CAPS Take 200 mg by mouth daily.      aspirin EC 81 MG tablet Take 81 mg by  mouth daily.     chlorthalidone (HYGROTON) 25 MG tablet TAKE (1/2) TABLET BY MOUTH ONCE DAILY. 45 tablet 2   Coenzyme Q10 (CO Q-10) 100 MG CAPS Take 100 mg by mouth daily.      ezetimibe (ZETIA) 10 MG tablet Take 1 tablet (10 mg total) by mouth daily. 90 tablet 3   Green Tea 150 MG CAPS Take 150 mg by mouth daily.      lisinopril (ZESTRIL) 20 MG tablet Take 1 tablet (20 mg total) by mouth daily. 90 tablet 3   metoprolol succinate (TOPROL-XL) 25 MG 24 hr  tablet TAKE 1 TABLET BY MOUTH ONCE DAILY. 90 tablet 3   nitroGLYCERIN (NITROSTAT) 0.4 MG SL tablet PLACE 1 TAB UNDER TONGUE EVERY 5 MIN IF NEEDED FOR CHEST PAIN. MAY USE 3 TIMES.NO RELIEF CALL 911. 25 tablet 3   rosuvastatin (CRESTOR) 40 MG tablet Take 1 tablet (40 mg total) by mouth daily. 90 tablet 3   sildenafil (VIAGRA) 100 MG tablet TAKE (1) TABLET BY MOUTH AS NEEDED FOR ERECTILE DYSFUNCTION. 6 tablet 0   traZODone (DESYREL) 50 MG tablet Take 0.5-1 tablets (25-50 mg total) by mouth at bedtime as needed for sleep. (Patient taking differently: Take 25 mg by mouth at bedtime as needed for sleep.) 30 tablet 3   Turmeric 500 MG CAPS Take 500 mg by mouth daily.      vitamin B-12 (CYANOCOBALAMIN) 500 MCG tablet Take 1 tablet (500 mcg total) by mouth daily.     vitamin C (ASCORBIC ACID) 500 MG tablet Take 500 mg by mouth daily.     No facility-administered medications prior to visit.    Allergies  Allergen Reactions   Flonase [Fluticasone Propionate] Other (See Comments)    Nose bleeds.    ROS Review of Systems  Constitutional:  Negative for chills and fever.  HENT:  Negative for congestion and sore throat.   Eyes:  Negative for pain and discharge.  Respiratory:  Negative for cough and shortness of breath.   Cardiovascular:  Negative for chest pain and palpitations.  Gastrointestinal:  Negative for constipation, diarrhea, nausea and vomiting.  Endocrine: Negative for polydipsia and polyuria.  Genitourinary:  Negative for dysuria and  hematuria.  Musculoskeletal:  Negative for neck pain and neck stiffness.  Skin:  Positive for color change. Negative for rash.  Neurological:  Negative for dizziness, weakness, numbness and headaches.  Psychiatric/Behavioral:  Negative for agitation and behavioral problems.       Objective:    Physical Exam Vitals reviewed.  Constitutional:      General: He is not in acute distress.    Appearance: He is not diaphoretic.  HENT:     Head: Normocephalic and atraumatic.     Nose: Nose normal.     Mouth/Throat:     Mouth: Mucous membranes are moist.  Eyes:     General: No scleral icterus.    Extraocular Movements: Extraocular movements intact.  Cardiovascular:     Rate and Rhythm: Normal rate and regular rhythm.     Pulses: Normal pulses.     Heart sounds: Normal heart sounds. No murmur heard. Pulmonary:     Breath sounds: Normal breath sounds. No wheezing or rales.  Abdominal:     Palpations: Abdomen is soft.     Tenderness: There is no abdominal tenderness.  Musculoskeletal:     Cervical back: Neck supple. No tenderness.     Right lower leg: No edema.     Left lower leg: No edema.  Skin:    General: Skin is warm.     Findings: Erythema (With warmth and tenderness over medial aspect of right leg, about 3 cm in diameter) present. No rash.  Neurological:     General: No focal deficit present.     Mental Status: He is alert and oriented to person, place, and time.     Cranial Nerves: No cranial nerve deficit.     Sensory: No sensory deficit.     Motor: No weakness.  Psychiatric:        Mood and Affect: Mood normal.  Behavior: Behavior normal.     BP 111/70 (BP Location: Left Arm, Patient Position: Sitting, Cuff Size: Large)   Pulse 78   Ht 5\' 10"  (1.778 m)   Wt 217 lb 12.8 oz (98.8 kg)   SpO2 95%   BMI 31.25 kg/m  Wt Readings from Last 3 Encounters:  08/03/22 217 lb 12.8 oz (98.8 kg)  01/23/22 217 lb 3.2 oz (98.5 kg)  12/30/21 210 lb (95.3 kg)    Lab  Results  Component Value Date   TSH 1.250 07/16/2021   Lab Results  Component Value Date   WBC 7.3 07/16/2021   HGB 16.4 07/16/2021   HCT 47.5 07/16/2021   MCV 96 07/16/2021   PLT 165 07/16/2021   Lab Results  Component Value Date   NA 142 01/23/2022   K 4.3 01/23/2022   CO2 26 01/23/2022   GLUCOSE 115 (H) 01/23/2022   BUN 15 01/23/2022   CREATININE 1.08 01/23/2022   BILITOT 0.5 07/16/2021   ALKPHOS 70 07/16/2021   AST 33 07/16/2021   ALT 40 07/16/2021   PROT 6.9 07/16/2021   ALBUMIN 4.7 07/16/2021   CALCIUM 9.5 01/23/2022   ANIONGAP 11 08/19/2020   EGFR 72 01/23/2022   Lab Results  Component Value Date   CHOL 114 01/23/2022   Lab Results  Component Value Date   HDL 46 01/23/2022   Lab Results  Component Value Date   LDLCALC 51 01/23/2022   Lab Results  Component Value Date   TRIG 88 01/23/2022   Lab Results  Component Value Date   CHOLHDL 2.5 01/23/2022   Lab Results  Component Value Date   HGBA1C 6.1 (H) 01/23/2022      Assessment & Plan:   Problem List Items Addressed This Visit       Cardiovascular and Mediastinum   Coronary artery disease involving native coronary artery of native heart without angina pectoris    On Aspirin, statin and Zetia On Metoprolol and Lisinopril Had ischemic CM, but LVEF has improved now. Followed by Cardiology      Relevant Orders   TSH   CMP14+EGFR   CBC with Differential/Platelet   Essential hypertension    BP Readings from Last 1 Encounters:  08/03/22 111/70   Well-controlled with Lisinopril, Chlorthalidone and Metoprolol Counseled for compliance with the medications Advised DASH diet and moderate exercise/walking, at least 150 mins/week      Relevant Orders   TSH   CMP14+EGFR   CBC with Differential/Platelet     Other   Prostate cancer screening    Ordered PSA after discussing its limitations for prostate cancer screening, including false positive results leading to additional investigations.       Relevant Orders   PSA   Encounter for general adult medical examination with abnormal findings - Primary    Physical exam as documented. Counseling done  re healthy lifestyle involving commitment to 150 minutes exercise per week, heart healthy diet, and attaining healthy weight.The importance of adequate sleep also discussed. Changes in health habits are decided on by the patient with goals and time frames  set for achieving them. Immunization and cancer screening needs are specifically addressed at this visit.      Prediabetes   Relevant Orders   Hemoglobin A1c   CMP14+EGFR   Vitamin D deficiency   Relevant Orders   VITAMIN D 25 Hydroxy (Vit-D Deficiency, Fractures)   Mixed hyperlipidemia   Relevant Orders   Lipid panel   Cellulitis of right lower  extremity    Local erythema and warmth without fluctuant area likely cellulitis Started doxycycline (MRSA coverage) Continue local care with Neosporin      Relevant Medications   doxycycline (VIBRA-TABS) 100 MG tablet   Other Relevant Orders   CBC with Differential/Platelet    Meds ordered this encounter  Medications   doxycycline (VIBRA-TABS) 100 MG tablet    Sig: Take 1 tablet (100 mg total) by mouth 2 (two) times daily.    Dispense:  14 tablet    Refill:  0    Follow-up: Return in about 6 months (around 02/03/2023) for HTN and CAD.    Anabel Halon, MD

## 2022-08-05 ENCOUNTER — Encounter: Payer: Self-pay | Admitting: Internal Medicine

## 2022-08-05 ENCOUNTER — Other Ambulatory Visit: Payer: Self-pay | Admitting: Internal Medicine

## 2022-08-05 DIAGNOSIS — L03115 Cellulitis of right lower limb: Secondary | ICD-10-CM

## 2022-08-05 MED ORDER — AMOXICILLIN-POT CLAVULANATE 875-125 MG PO TABS: 1.0000 | ORAL_TABLET | Freq: Two times a day (BID) | ORAL | 0 refills | Status: DC

## 2022-08-21 DIAGNOSIS — Z125 Encounter for screening for malignant neoplasm of prostate: Secondary | ICD-10-CM | POA: Diagnosis not present

## 2022-08-21 DIAGNOSIS — L03115 Cellulitis of right lower limb: Secondary | ICD-10-CM | POA: Diagnosis not present

## 2022-08-21 DIAGNOSIS — E559 Vitamin D deficiency, unspecified: Secondary | ICD-10-CM | POA: Diagnosis not present

## 2022-08-21 DIAGNOSIS — I2510 Atherosclerotic heart disease of native coronary artery without angina pectoris: Secondary | ICD-10-CM | POA: Diagnosis not present

## 2022-08-21 DIAGNOSIS — E782 Mixed hyperlipidemia: Secondary | ICD-10-CM | POA: Diagnosis not present

## 2022-08-21 DIAGNOSIS — R7303 Prediabetes: Secondary | ICD-10-CM | POA: Diagnosis not present

## 2022-08-21 DIAGNOSIS — I10 Essential (primary) hypertension: Secondary | ICD-10-CM | POA: Diagnosis not present

## 2022-09-04 ENCOUNTER — Other Ambulatory Visit: Payer: Self-pay | Admitting: Cardiology

## 2022-09-06 ENCOUNTER — Emergency Department (HOSPITAL_COMMUNITY)
Admission: EM | Admit: 2022-09-06 | Discharge: 2022-09-06 | Discharge status: Against Medical Advice | Payer: Medicare PPO | Source: Home / Self Care

## 2022-09-06 ENCOUNTER — Emergency Department (HOSPITAL_COMMUNITY): Payer: Medicare PPO

## 2022-09-06 DIAGNOSIS — W19XXXA Unspecified fall, initial encounter: Secondary | ICD-10-CM | POA: Insufficient documentation

## 2022-09-06 DIAGNOSIS — M25511 Pain in right shoulder: Secondary | ICD-10-CM | POA: Diagnosis not present

## 2022-09-06 DIAGNOSIS — S4991XA Unspecified injury of right shoulder and upper arm, initial encounter: Secondary | ICD-10-CM | POA: Diagnosis not present

## 2022-09-06 DIAGNOSIS — Z5321 Procedure and treatment not carried out due to patient leaving prior to being seen by health care provider: Secondary | ICD-10-CM | POA: Diagnosis not present

## 2022-09-06 NOTE — ED Triage Notes (Signed)
Pt stated that he was stepping up into the bed of his truck when he fell back on the ground landing on his shoulder. Pt stated that he is not taking blood thinners and did not have a LOC. Pt stated that he just wants an x ray of his shoulder and to go home.

## 2022-10-27 ENCOUNTER — Encounter: Payer: Self-pay | Admitting: Internal Medicine

## 2022-11-09 ENCOUNTER — Ambulatory Visit (INDEPENDENT_AMBULATORY_CARE_PROVIDER_SITE_OTHER): Payer: Medicare PPO

## 2022-11-09 VITALS — Ht 70.0 in | Wt 210.0 lb

## 2022-11-09 DIAGNOSIS — Z0000 Encounter for general adult medical examination without abnormal findings: Secondary | ICD-10-CM | POA: Diagnosis not present

## 2022-11-09 NOTE — Progress Notes (Signed)
Because this visit was a virtual/telehealth visit,  certain criteria was not obtained, such a blood pressure, CBG if applicable, and timed get up and go. Any medications not marked as "taking" were not mentioned during the medication reconciliation part of the visit. Any vitals not documented were not able to be obtained due to this being a telehealth visit or patient was unable to self-report a recent blood pressure reading due to a lack of equipment at home via telehealth. Vitals that have been documented are verbally provided by the patient.   Subjective:   Austin Bright is a 77 y.o. male who presents for Medicare Annual/Subsequent preventive examination.  Visit Complete: Virtual I connected with  Austin Bright on 11/09/22 by a audio enabled telemedicine application and verified that I am speaking with the correct person using two identifiers.  Patient Location: Home  Provider Location: Home Office  I discussed the limitations of evaluation and management by telemedicine. The patient expressed understanding and agreed to proceed.  Vital Signs: Because this visit was a virtual/telehealth visit, some criteria may be missing or patient reported. Any vitals not documented were not able to be obtained and vitals that have been documented are patient reported.  Patient Medicare AWV questionnaire was completed by the patient on 11/06/2022; I have confirmed that all information answered by patient is correct and no changes since this date.  Cardiac Risk Factors include: advanced age (>53men, >1 women);dyslipidemia;hypertension;male gender;obesity (BMI >30kg/m2);sedentary lifestyle     Objective:    Today's Vitals   11/06/22 1111 11/09/22 0800  Weight:  210 lb (95.3 kg)  Height:  5\' 10"  (1.778 m)  PainSc: 3     Body mass index is 30.13 kg/m.     11/09/2022    8:05 AM 10/15/2021    8:34 AM 03/17/2021    7:05 AM 03/13/2021    8:56 AM 10/04/2020    3:39 PM 08/18/2020    3:00 PM  08/17/2020    8:22 PM  Advanced Directives  Does Patient Have a Medical Advance Directive? Yes Yes  Yes Yes Yes No  Type of Estate agent of [place];Living will Living will Living will;Healthcare Power of [employer] Power of [place];Living will Healthcare Power of [place];Living will Living will;Healthcare Power of Attorney   Does patient want to make changes to medical advance directive?      No - Patient declined   Copy of Healthcare Power of Attorney in Chart? No - copy requested   No - copy requested  Yes - validated most recent copy scanned in chart (See row information)   Would patient like information on creating a medical advance directive?      No - Patient declined No - Patient declined    Current Medications (verified) Outpatient Encounter Medications as of 11/09/2022  Medication Sig   Alpha Lipoic Acid 200 MG CAPS Take 200 mg by mouth daily.    amoxicillin-clavulanate (AUGMENTIN) 875-125 MG tablet Take 1 tablet by mouth 2 (two) times daily.   aspirin EC 81 MG tablet Take 81 mg by mouth daily.   chlorthalidone (HYGROTON) 25 MG tablet TAKE (1/2) TABLET BY MOUTH ONCE DAILY.   Coenzyme Q10 (CO Q-10) 100 MG CAPS Take 100 mg by mouth daily.    doxycycline (VIBRA-TABS) 100 MG tablet Take 1 tablet (100 mg total) by mouth 2 (two) times daily.   ezetimibe (ZETIA) 10 MG tablet Take 1 tablet (10 mg total) by mouth daily.   Chilton Si  Tea 150 MG CAPS Take 150 mg by mouth daily.    lisinopril (ZESTRIL) 20 MG tablet Take 1 tablet (20 mg total) by mouth daily.   metoprolol succinate (TOPROL-XL) 25 MG 24 hr tablet TAKE 1 TABLET BY MOUTH ONCE DAILY.   nitroGLYCERIN (NITROSTAT) 0.4 MG SL tablet PLACE 1 TAB UNDER TONGUE EVERY 5 MIN IF NEEDED FOR CHEST PAIN. MAY USE 3 TIMES.NO RELIEF CALL 911.   rosuvastatin (CRESTOR) 40 MG tablet Take 1 tablet (40 mg total) by mouth daily.   sildenafil (VIAGRA) 100 MG tablet TAKE (1) TABLET BY MOUTH AS NEEDED FOR ERECTILE DYSFUNCTION.    traZODone (DESYREL) 50 MG tablet Take 0.5-1 tablets (25-50 mg total) by mouth at bedtime as needed for sleep. (Patient taking differently: Take 25 mg by mouth at bedtime as needed for sleep.)   Turmeric 500 MG CAPS Take 500 mg by mouth daily.    vitamin B-12 (CYANOCOBALAMIN) 500 MCG tablet Take 1 tablet (500 mcg total) by mouth daily.   vitamin C (ASCORBIC ACID) 500 MG tablet Take 500 mg by mouth daily.   No facility-administered encounter medications on file as of 11/09/2022.    Allergies (verified) Flonase [fluticasone propionate]   History: Past Medical History:  Diagnosis Date   Acute diverticulitis 08/17/2020   Acute MI (HCC) 08/2011   Arthritis    Bilateral pneumonia    Diagnosed after STEMI 08/2011   CAD (coronary artery disease)    a. Diagnosed 08/2011 with anterior STEMI due to thrombotic occlusion of mid LAD s/p thrombectomy, PTCA, DES placement 08/23/11.   CHF (congestive heart failure) (HCC)    Chronic combined systolic and diastolic congestive heart failure (HCC) 09/15/2011   Dyslipidemia    Gout    Hypertension    Ischemic cardiomyopathy    a. Initial EF 35% by cath 08/23/11, improved to 40-45% by echo 08/25/11. 50-55% by echo November 2013.   Shortness of breath    Past Surgical History:  Procedure Laterality Date   carpel tunnel Bilateral 2002   COLONOSCOPY WITH PROPOFOL N/A 04/08/2017   Surgeon: Corbin Ade, MD;  diverticulosis in sigmoid and descending colon, internal hemorrhoids, otherwise normal exam.  No recommendations to repeat due to age.   COLONOSCOPY WITH PROPOFOL N/A 03/17/2021   Procedure: COLONOSCOPY WITH PROPOFOL;  Surgeon: Lanelle Bal, DO;  Location: [facility];  Service: Endoscopy;  Laterality: N/A;  8:00am   CORONARY STENT PLACEMENT     CYSTECTOMY  1969   pilonidal cyst   LEFT AND RIGHT HEART CATHETERIZATION WITH CORONARY ANGIOGRAM N/A 09/15/2011   Procedure: LEFT AND RIGHT HEART CATHETERIZATION WITH CORONARY ANGIOGRAM;  Surgeon: Herby Abraham, MD;  Location: [facility] CATH LAB;  Service: Cardiovascular;  Laterality: N/A;   LEFT HEART CATH N/A 08/23/2011   Procedure: LEFT HEART CATH;  Surgeon: Iran Ouch, MD;  Location: [facility] CATH LAB;  Service: Cardiovascular;  Laterality: N/A;   NO PAST SURGERIES     PERCUTANEOUS CORONARY STENT INTERVENTION (PCI-S)  08/23/2011   Procedure: PERCUTANEOUS CORONARY STENT INTERVENTION (PCI-S);  Surgeon: Iran Ouch, MD;  Location: [facility] CATH LAB;  Service: Cardiovascular;;   Family History  Problem Relation Age of Onset   Stroke Mother    Hypertension Father    Colon cancer Neg Hx    Pancreatic cancer Neg Hx    Social History   Socioeconomic History   Marital status: Married    Spouse name: Not on file   Number of children: Not on file  Years of education: Not on file   Highest education level: Not on file  Occupational History   Occupation: Child psychotherapist  Tobacco Use   Smoking status: Former    Current packs/day: 0.00    Average packs/day: 1 pack/day for 25.0 years (25.0 ttl pk-yrs)    Types: Cigarettes    Start date: 08/23/1954    Quit date: 08/23/1979    Years since quitting: 43.2   Smokeless tobacco: Never  Vaping Use   Vaping status: Never Used  Substance and Sexual Activity   Alcohol use: Yes    Alcohol/week: 4.0 standard drinks of alcohol    Types: 2 Cans of beer, 2 Shots of liquor per week    Comment: 1-2 drinks on the weekends   Drug use: No   Sexual activity: Yes  Other Topics Concern   Not on file  Social History Narrative   Widower since 37,married for 10 years.Lives alone,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      11/09/2022 Remarried. Wifes name is Baird Lyons.    Social Determinants of Health   Financial Resource Strain: Low Risk  (11/06/2022)   Overall Financial Resource Strain (CARDIA)    Difficulty of Paying Living Expenses: Not hard at all  Food Insecurity: No Food Insecurity (11/06/2022)   Hunger Vital Sign     Worried About Running Out of Food in the Last Year: Never true    Ran Out of Food in the Last Year: Never true  Transportation Needs: No Transportation Needs (11/06/2022)   PRAPARE - Administrator, Civil Service (Medical): No    Lack of Transportation (Non-Medical): No  Physical Activity: Insufficiently Active (11/06/2022)   Exercise Vital Sign    Days of Exercise per Week: 3 days    Minutes of Exercise per Session: 30 min  Stress: No Stress Concern Present (11/06/2022)   [employer] of Occupational Health - Occupational Stress Questionnaire    Feeling of Stress : Not at all  Social Connections: Unknown (11/06/2022)   Social Connection and Isolation Panel [NHANES]    Frequency of Communication with Friends and Family: Twice a week    Frequency of Social Gatherings with Friends and Family: Once a week    Attends Religious Services: Not on Marketing executive or Organizations: Yes    Attends Banker Meetings: 1 to 4 times per year    Marital Status: Married    Tobacco Counseling Counseling given: Yes   Clinical Intake:  Pre-visit preparation completed: Yes  Pain : 0-10 Pain Score: 3  Pain Type: Acute pain (pain from a fall ~ 2 mths ago) Pain Location: Shoulder Pain Orientation: Right Pain Descriptors / Indicators: Aching (pain is in the muscle not the joint) Pain Onset: More than a month ago Pain Frequency: Constant     BMI - recorded: 30.13 Nutritional Status: BMI > 30  Obese Nutritional Risks: None Diabetes: No  How often do you need to have someone help you when you read instructions, pamphlets, or other written materials from your doctor or pharmacy?: 1 - Never  Interpreter Needed?: No  Information entered by :: Abby Chip Canepa, CMA   Activities of Daily Living    11/06/2022   11:11 AM  In your present state of health, do you have any difficulty performing the following activities:  Hearing? 0  Vision? 0  Difficulty  concentrating or making decisions? 0  Walking or climbing stairs? 0  Dressing  or bathing? 0  Doing errands, shopping? 0  Preparing Food and eating ? N  Using the Toilet? N  In the past six months, have you accidently leaked urine? N  Do you have problems with loss of bowel control? N  Managing your Medications? N  Managing your Finances? N  Housekeeping or managing your Housekeeping? N    Patient Care Team: Anabel Halon, MD as PCP - General (Internal Medicine) Wyline Mood Dorothe Pea, MD as PCP - Cardiology (Cardiology) Daleen Squibb, Jesse Sans, MD (Inactive) as Attending Physician (Cardiology) Corbin Ade, MD as Consulting Physician (Gastroenterology)  Indicate any recent Medical Services you may have received from other than [facility] providers in the past year (date may be approximate).     Assessment:   This is a routine wellness examination for Austin Bright.  Hearing/Vision screen Hearing Screening - Comments:: Patient denies any hearing difficulties.   Vision Screening - Comments:: Wears rx glasses - has been several years since he had an eye exam. Declines referral today.    Goals Addressed             This Visit's Progress    Patient Stated       I need to lose some more weight and get back into good physical condition. I need to get more sleep.        Depression Screen    11/09/2022    8:07 AM 08/03/2022    8:10 AM 01/23/2022    8:07 AM 10/15/2021    8:30 AM 07/18/2021    8:57 AM 01/27/2021    9:16 AM 01/21/2021    3:00 PM  PHQ 2/9 Scores  PHQ - 2 Score 0 0 0 0 0 0 0  PHQ- 9 Score 0  3        Fall Risk    11/06/2022   11:11 AM 08/03/2022    8:10 AM 01/23/2022    8:07 AM 10/15/2021    8:29 AM 07/18/2021    8:56 AM  Fall Risk   Falls in the past year? 1 0 0 0 0  Number falls in past yr: 0 0 0 0 0  Injury with Fall? 1 0 0 0 0  Risk for fall due to : History of fall(s)  No Fall Risks  No Fall Risks  Follow up Falls prevention discussed;Education provided  Falls evaluation  completed Falls evaluation completed;Falls prevention discussed;Education provided Falls evaluation completed    MEDICARE RISK AT HOME: Medicare Risk at Home Any stairs in or around the home?: Yes If so, are there any without handrails?: No Home free of loose throw rugs in walkways, pet beds, electrical cords, etc?: Yes Adequate lighting in your home to reduce risk of falls?: Yes Life alert?: No Use of a cane, walker or w/c?: No Grab bars in the bathroom?: Yes Shower chair or bench in shower?: No Elevated toilet seat or a handicapped toilet?: No  TIMED UP AND GO:  Was the test performed?  No    Cognitive Function:        11/09/2022    8:07 AM 10/15/2021    8:33 AM 10/04/2020    3:40 PM  6CIT Screen  What Year? 0 points 0 points 0 points  What month? 0 points 0 points 0 points  What time? 0 points 0 points 0 points  Count back from 20 0 points 0 points 0 points  Months in reverse 0 points 0 points 0 points  Repeat phrase 0 points  0 points 0 points  Total Score 0 points 0 points 0 points    Immunizations Immunization History  Administered Date(s) Administered   Fluad Quad(high Dose 65+) 12/11/2019, 09/26/2020, 10/09/2022   Influenza, High Dose Seasonal PF 09/22/2018   Influenza-Unspecified 10/19/2017, 10/12/2021   Moderna Sars-Covid-2 Vaccination 03/16/2019, 04/14/2019, 11/16/2019, 08/11/2020, 05/24/2021   Pneumococcal Conjugate-13 10/31/2013   Pneumococcal Polysaccharide-23 10/21/2011, 10/11/2017   Tdap 09/21/2012   Unspecified SARS-COV-2 Vaccination 10/09/2022   Zoster, Live 07/28/2013    TDAP status: Due, Education has been provided regarding the importance of this vaccine. Advised may receive this vaccine at local pharmacy or Health Dept. Aware to provide a copy of the vaccination record if obtained from local pharmacy or Health Dept. Verbalized acceptance and understanding.  Flu Vaccine status: Up to date  Pneumococcal vaccine status: Up to date  Covid-19  vaccine status: Completed vaccines  Qualifies for Shingles Vaccine? Yes   Zostavax completed Yes   Shingrix Completed?: No.    Education has been provided regarding the importance of this vaccine. Patient has been advised to call insurance company to determine out of pocket expense if they have not yet received this vaccine. Advised may also receive vaccine at local pharmacy or Health Dept. Verbalized acceptance and understanding.  Screening Tests Health Maintenance  Topic Date Due   Zoster Vaccines- Shingrix (1 of 2) 10/17/1996   DTaP/Tdap/Td (2 - Td or Tdap) 09/22/2022   Medicare Annual Wellness (AWV)  10/16/2022   COVID-19 Vaccine (7 - 2023-24 season) 12/04/2022   Pneumonia Vaccine 84+ Years old  Completed   INFLUENZA VACCINE  Completed   Hepatitis C Screening  Completed   HPV VACCINES  Aged Out   Colonoscopy  Discontinued    Health Maintenance  Health Maintenance Due  Topic Date Due   Zoster Vaccines- Shingrix (1 of 2) 10/17/1996   DTaP/Tdap/Td (2 - Td or Tdap) 09/22/2022   Medicare Annual Wellness (AWV)  10/16/2022    Colorectal cancer screening: No longer required.   Lung Cancer Screening: (Low Dose CT Chest recommended if Age 65-80 years, 20 pack-year currently smoking OR have quit w/in 15years.) does not qualify.   Lung Cancer Screening Referral: na  Additional Screening:  Hepatitis C Screening: does not qualify; Completed 08/18/2020  Vision Screening: Recommended annual ophthalmology exams for early detection of glaucoma and other disorders of the eye. Is the patient up to date with their annual eye exam?  No  Who is the provider or what is the name of the office in which the patient attends annual eye exams? N/a If pt is not established with a provider, would they like to be referred to a provider to establish care? No .   Dental Screening: Recommended annual dental exams for proper oral hygiene  Diabetic Foot Exam: na  Community Resource Referral / Chronic  Care Management: CRR required this visit?  No   CCM required this visit?  No     Plan:     I have personally reviewed and noted the following in the patient's chart:   Medical and social history Use of alcohol, tobacco or illicit drugs  Current medications and supplements including opioid prescriptions. Patient is not currently taking opioid prescriptions. Functional ability and status Nutritional status Physical activity Advanced directives List of other physicians Hospitalizations, surgeries, and ER visits in previous 12 months Vitals Screenings to include cognitive, depression, and falls Referrals and appointments  In addition, I have reviewed and discussed with patient certain preventive protocols, quality metrics,  and best practice recommendations. A written personalized care plan for preventive services as well as general preventive health recommendations were provided to patient.     Austin Bright, CMA   11/09/2022   After Visit Summary: (MyChart) Due to this being a telephonic visit, the after visit summary with patients personalized plan was offered to patient via MyChart   Nurse Notes: patient declined referral to eye doctor

## 2022-11-09 NOTE — Patient Instructions (Signed)
Austin Bright , Thank you for taking time to come for your Medicare Wellness Visit. I appreciate your ongoing commitment to your health goals. Please review the following plan we discussed and let me know if I can assist you in the future.   Referrals/Orders/Follow-Ups/Clinician Recommendations:  Next Medicare Annual Wellness Visit: November 10, 2023 at 8:00 am virtual visit  You are due for the vaccines checked below. You may have these done at your preferred pharmacy. Please have them fax the office proof of the vaccines so that we can update your chart.   []  Flu (due annually)  Recommended this fall either at PCP office or through your local pharmacy. The flu season starts August 1 of each year.   [x]  Shingrix (Shingles vaccine): CDC recommends 2 doses of Shingrix separated by 2-6 months for aged 19 years and older:  []  Pneumonia Vaccines: Recommended for adults 65 years or older  [x]  TDAP (Tetanus) Vaccine every 10 years:Recommended every 10 years; Please call your insurance company to determine your out of pocket expense. You also receive this vaccine at your local pharmacy or Health Dept.  []  Covid-19: Available now at any [facility] pharmacy (see info below)  You may also get your vaccines at any [facility] (locations listed below.) Vaccine hours are Monday - Friday 9:00 - 4:00. No appointments are required. Most insurances are accepted including Medicaid. Anyone can use the community pharmacies, and people are not required to have a [facility] provider.  Community Pharmacy Locations offering vaccines:   Sport and exercise psychologist   [facility] [place]  10 vaccines are offered at the [employer]: Covid, flu, Tdap, shingles, RSV, pneumonia, meningococcal, hepatitis A, hepatitis B, and HPV.    This is a list of the screening recommended for you and due dates:  Health  Maintenance  Topic Date Due   Zoster (Shingles) Vaccine (1 of 2) 10/17/1996   DTaP/Tdap/Td vaccine (2 - Td or Tdap) 09/22/2022   COVID-19 Vaccine (7 - 2023-24 season) 12/04/2022   Medicare Annual Wellness Visit  11/09/2023   Pneumonia Vaccine  Completed   Flu Shot  Completed   Hepatitis C Screening  Completed   HPV Vaccine  Aged Out   Colon Cancer Screening  Discontinued    Advanced directives: (Copy Requested) Please bring a copy of your health care power of attorney and living will to the office to be added to your chart at your convenience.  Next Medicare Annual Wellness Visit scheduled for next year: Yes  Preventive Care 23 Years and Older, Male Preventive care refers to lifestyle choices and visits with your health care provider that can promote health and wellness. Preventive care visits are also called wellness exams. What can I expect for my preventive care visit? Counseling During your preventive care visit, your health care provider may ask about your: Medical history, including: Past medical problems. Family medical history. History of falls. Current health, including: Emotional well-being. Home life and relationship well-being. Sexual activity. Memory and ability to understand (cognition). Lifestyle, including: Alcohol, nicotine or tobacco, and drug use. Access to firearms. Diet, exercise, and sleep habits. Work and work Astronomer. Sunscreen use. Safety issues such as seatbelt and bike helmet use. Physical exam Your health care provider will check your: Height and weight. These may be used to calculate your BMI (body mass index). BMI is a measurement that tells if you are at  a healthy weight. Waist circumference. This measures the distance around your waistline. This measurement also tells if you are at a healthy weight and may help predict your risk of certain diseases, such as type 2 diabetes and high blood pressure. Heart rate and blood pressure. Body  temperature. Skin for abnormal spots. What immunizations do I need?  Vaccines are usually given at various ages, according to a schedule. Your health care provider will recommend vaccines for you based on your age, medical history, and lifestyle or other factors, such as travel or where you work. What tests do I need? Screening Your health care provider may recommend screening tests for certain conditions. This may include: Lipid and cholesterol levels. Diabetes screening. This is done by checking your blood sugar (glucose) after you have not eaten for a while (fasting). Hepatitis C test. Hepatitis B test. HIV (human immunodeficiency virus) test. STI (sexually transmitted infection) testing, if you are at risk. Lung cancer screening. Colorectal cancer screening. Prostate cancer screening. Abdominal aortic aneurysm (AAA) screening. You may need this if you are a current or former smoker. Talk with your health care provider about your test results, treatment options, and if necessary, the need for more tests. Follow these instructions at home: Eating and drinking  Eat a diet that includes fresh fruits and vegetables, whole grains, lean protein, and low-fat dairy products. Limit your intake of foods with high amounts of sugar, saturated fats, and salt. Take vitamin and mineral supplements as recommended by your health care provider. Do not drink alcohol if your health care provider tells you not to drink. If you drink alcohol: Limit how much you have to 0-2 drinks a day. Know how much alcohol is in your drink. In the [place] one drink equals one 12 oz bottle of beer (355 mL), one 5 oz glass of wine (148 mL), or one 1 oz glass of hard liquor (44 mL). Lifestyle Brush your teeth every morning and night with fluoride toothpaste. Floss one time each day. Exercise for at least 30 minutes 5 or more days each week. Do not use any products that contain nicotine or tobacco. These products include  cigarettes, chewing tobacco, and vaping devices, such as e-cigarettes. If you need help quitting, ask your health care provider. Do not use drugs. If you are sexually active, practice safe sex. Use a condom or other form of protection to prevent STIs. Take aspirin only as told by your health care provider. Make sure that you understand how much to take and what form to take. Work with your health care provider to find out whether it is safe and beneficial for you to take aspirin daily. Ask your health care provider if you need to take a cholesterol-lowering medicine (statin). Find healthy ways to manage stress, such as: Meditation, yoga, or listening to music. Journaling. Talking to a trusted person. Spending time with friends and family. Safety Always wear your seat belt while driving or riding in a vehicle. Do not drive: If you have been drinking alcohol. Do not ride with someone who has been drinking. When you are tired or distracted. While texting. If you have been using any mind-altering substances or drugs. Wear a helmet and other protective equipment during sports activities. If you have firearms in your house, make sure you follow all gun safety procedures. Minimize exposure to UV radiation to reduce your risk of skin cancer. What's next? Visit your health care provider once a year for an annual wellness visit. Ask your  health care provider how often you should have your eyes and teeth checked. Stay up to date on all vaccines. This information is not intended to replace advice given to you by your health care provider. Make sure you discuss any questions you have with your health care provider. Document Revised: 07/03/2020 Document Reviewed: 07/03/2020 Elsevier Patient Education  2024 [employer]. Understanding Your Risk for Falls Millions of people have serious injuries from falls each year. It is important to understand your risk of falling. Talk with your health care provider  about your risk and what you can do to lower it. If you do have a serious fall, make sure to tell your provider. Falling once raises your risk of falling again. How can falls affect me? Serious injuries from falls are common. These include: Broken bones, such as hip fractures. Head injuries, such as traumatic brain injuries (TBI) or concussions. A fear of falling can cause you to avoid activities and stay at home. This can make your muscles weaker and raise your risk for a fall. What can increase my risk? There are a number of risk factors that increase your risk for falling. The more risk factors you have, the higher your risk of falling. Serious injuries from a fall happen most often to people who are older than 76 years old. Teenagers and young adults ages 42-29 are also at higher risk. Common risk factors include: Weakness in the lower body. Being generally weak or confused due to long-term (chronic) illness. Dizziness or balance problems. Poor vision. Medicines that cause dizziness or drowsiness. These may include: Medicines for your blood pressure, heart, anxiety, insomnia, or swelling (edema). Pain medicines. Muscle relaxants. Other risk factors include: Drinking alcohol. Having had a fall in the past. Having foot pain or wearing improper footwear. Working at a dangerous job. Having any of the following in your home: Tripping hazards, such as floor clutter or loose rugs. Poor lighting. Pets. Having dementia or memory loss. What actions can I take to lower my risk of falling?     Physical activity Stay physically fit. Do strength and balance exercises. Consider taking a regular class to build strength and balance. Yoga and tai chi are good options. Vision Have your eyes checked every year and your prescription for glasses or contacts updated as needed. Shoes and walking aids Wear non-skid shoes. Wear shoes that have rubber soles and low heels. Do not wear high heels. Do  not walk around the house in socks or slippers. Use a cane or walker as told by your provider. Home safety Attach secure railings on both sides of your stairs. Install grab bars for your bathtub, shower, and toilet. Use a non-skid mat in your bathtub or shower. Attach bath mats securely with double-sided, non-slip rug tape. Use good lighting in all rooms. Keep a flashlight near your bed. Make sure there is a clear path from your bed to the bathroom. Use night-lights. Do not use throw rugs. Make sure all carpeting is taped or tacked down securely. Remove all clutter from walkways and stairways, including extension cords. Repair uneven or broken steps and floors. Avoid walking on icy or slippery surfaces. Walk on the grass instead of on icy or slick sidewalks. Use ice melter to get rid of ice on walkways in the winter. Use a cordless phone. Questions to ask your health care provider Can you help me check my risk for a fall? Do any of my medicines make me more likely to fall? Should  I take a vitamin D supplement? What exercises can I do to improve my strength and balance? Should I make an appointment to have my vision checked? Do I need a bone density test to check for weak bones (osteoporosis)? Would it help to use a cane or a walker? Where to find more information Centers for Disease Control and Prevention, STEADI: TonerPromos.no Community-Based Fall Prevention Programs: TonerPromos.no [employer] on Aging: BaseRingTones.pl Contact a health care provider if: You fall at home. You are afraid of falling at home. You feel weak, drowsy, or dizzy. This information is not intended to replace advice given to you by your health care provider. Make sure you discuss any questions you have with your health care provider. Document Revised: 09/08/2021 Document Reviewed: 09/08/2021 Elsevier Patient Education  2024 [employer].

## 2022-12-24 ENCOUNTER — Encounter: Payer: Self-pay | Admitting: Cardiology

## 2022-12-24 ENCOUNTER — Ambulatory Visit: Payer: Medicare PPO | Attending: Cardiology | Admitting: Cardiology

## 2022-12-24 VITALS — BP 128/76 | HR 79 | Ht 70.0 in | Wt 216.0 lb

## 2022-12-24 DIAGNOSIS — E782 Mixed hyperlipidemia: Secondary | ICD-10-CM

## 2022-12-24 DIAGNOSIS — I10 Essential (primary) hypertension: Secondary | ICD-10-CM | POA: Diagnosis not present

## 2022-12-24 DIAGNOSIS — I2510 Atherosclerotic heart disease of native coronary artery without angina pectoris: Secondary | ICD-10-CM

## 2022-12-24 NOTE — Patient Instructions (Signed)
Medication Instructions:  Your physician recommends that you continue on your current medications as directed. Please refer to the Current Medication list given to you today.  *If you need a refill on your cardiac medications before your next appointment, please call your pharmacy*   Lab Work: None  If you have labs (blood work) drawn today and your tests are completely normal, you will receive your results only by: MyChart Message (if you have MyChart) OR A paper copy in the mail If you have any lab test that is abnormal or we need to change your treatment, we will call you to review the results.   Testing/Procedures: None   Follow-Up: At [facility], you and your health needs are our priority.  As part of our continuing mission to provide you with exceptional heart care, we have created designated Provider Care Teams.  These Care Teams include your primary Cardiologist (physician) and Advanced Practice Providers (APPs -  Physician Assistants and Nurse Practitioners) who all work together to provide you with the care you need, when you need it.  We recommend signing up for the patient portal called "MyChart".  Sign up information is provided on this After Visit Summary.  MyChart is used to connect with patients for Virtual Visits (Telemedicine).  Patients are able to view lab/test results, encounter notes, upcoming appointments, etc.  Non-urgent messages can be sent to your provider as well.   To learn more about what you can do with MyChart, go to [email or web address].    Your next appointment:   1 year(s)  Provider:   You may see Dina Rich, MD or one of the following Advanced Practice Providers on your designated Care Team:   Randall An, PA-C  Jacolyn Reedy, [place]     Other Instructions

## 2022-12-24 NOTE — Progress Notes (Signed)
Clinical Summary Austin Bright is a 77 y.o.male seen today for follow up of the following medical problems.      1. CAD - Prior anterior wall MI in 08/2011, received DES. LVEF in setting of MI 35% - echo 11/2011 LVEF 50-55%, grade II diastolic dysfunction.     - no chest pains, no SOB/DOE - compliant with meds  - walking up to 3 days a week, for 20 minutes. Walks up flight of stairs several times daily without exertional symptoms  2. HTN  - home bp's typically 120s/70s - compliant with meds   3. Hyperlipidemia - compliant with statin   06/2021 TC 123 TG 115 HDL 50 LDL 52 08/2022 TC 128 TG 65 HDL 55 LDL 59   4. Diastolic dysfunction - grade II diastolic dysfunction by most recent echo 11/2011   -denies any recent edema   5. Former tobacco - 2012 AAA [place] without aneurysm       SH: wife  passed May 2017, laryngeal cancer.  Works as Catering manager, working more Office manager.   Enjoying kayaking.  Past Medical History:  Diagnosis Date   Acute diverticulitis 08/17/2020   Acute MI (HCC) 08/2011   Arthritis    Bilateral pneumonia    Diagnosed after STEMI 08/2011   CAD (coronary artery disease)    a. Diagnosed 08/2011 with anterior STEMI due to thrombotic occlusion of mid LAD s/p thrombectomy, PTCA, DES placement 08/23/11.   CHF (congestive heart failure) (HCC)    Chronic combined systolic and diastolic congestive heart failure (HCC) 09/15/2011   Dyslipidemia    Gout    Hypertension    Ischemic cardiomyopathy    a. Initial EF 35% by cath 08/23/11, improved to 40-45% by echo 08/25/11. 50-55% by echo November 2013.   Shortness of breath      Allergies  Allergen Reactions   Flonase [Fluticasone Propionate] Other (See Comments)    Nose bleeds.     Current Outpatient Medications  Medication Sig Dispense Refill   Alpha Lipoic Acid 200 MG CAPS Take 200 mg by mouth daily.      amoxicillin-clavulanate (AUGMENTIN) 875-125 MG tablet Take 1 tablet by mouth 2 (two) times  daily. 14 tablet 0   aspirin EC 81 MG tablet Take 81 mg by mouth daily.     chlorthalidone (HYGROTON) 25 MG tablet TAKE (1/2) TABLET BY MOUTH ONCE DAILY. 45 tablet 2   Coenzyme Q10 (CO Q-10) 100 MG CAPS Take 100 mg by mouth daily.      doxycycline (VIBRA-TABS) 100 MG tablet Take 1 tablet (100 mg total) by mouth 2 (two) times daily. 14 tablet 0   ezetimibe (ZETIA) 10 MG tablet Take 1 tablet (10 mg total) by mouth daily. 90 tablet 3   Green Tea 150 MG CAPS Take 150 mg by mouth daily.      lisinopril (ZESTRIL) 20 MG tablet Take 1 tablet (20 mg total) by mouth daily. 90 tablet 3   metoprolol succinate (TOPROL-XL) 25 MG 24 hr tablet TAKE 1 TABLET BY MOUTH ONCE DAILY. 90 tablet 3   nitroGLYCERIN (NITROSTAT) 0.4 MG SL tablet PLACE 1 TAB UNDER TONGUE EVERY 5 MIN IF NEEDED FOR CHEST PAIN. MAY USE 3 TIMES.NO RELIEF CALL 911. 25 tablet 3   rosuvastatin (CRESTOR) 40 MG tablet Take 1 tablet (40 mg total) by mouth daily. 90 tablet 3   sildenafil (VIAGRA) 100 MG tablet TAKE (1) TABLET BY MOUTH AS NEEDED FOR ERECTILE DYSFUNCTION. 6 tablet 0  traZODone (DESYREL) 50 MG tablet Take 0.5-1 tablets (25-50 mg total) by mouth at bedtime as needed for sleep. (Patient taking differently: Take 25 mg by mouth at bedtime as needed for sleep.) 30 tablet 3   Turmeric 500 MG CAPS Take 500 mg by mouth daily.      vitamin B-12 (CYANOCOBALAMIN) 500 MCG tablet Take 1 tablet (500 mcg total) by mouth daily.     vitamin C (ASCORBIC ACID) 500 MG tablet Take 500 mg by mouth daily.     No current facility-administered medications for this visit.     Past Surgical History:  Procedure Laterality Date   carpel tunnel Bilateral 2002   COLONOSCOPY WITH PROPOFOL N/A 04/08/2017   Surgeon: Corbin Ade, MD;  diverticulosis in sigmoid and descending colon, internal hemorrhoids, otherwise normal exam.  No recommendations to repeat due to age.   COLONOSCOPY WITH PROPOFOL N/A 03/17/2021   Procedure: COLONOSCOPY WITH PROPOFOL;  Surgeon:  Lanelle Bal, DO;  Location: [facility];  Service: Endoscopy;  Laterality: N/A;  8:00am   CORONARY STENT PLACEMENT     CYSTECTOMY  1969   pilonidal cyst   LEFT AND RIGHT HEART CATHETERIZATION WITH CORONARY ANGIOGRAM N/A 09/15/2011   Procedure: LEFT AND RIGHT HEART CATHETERIZATION WITH CORONARY ANGIOGRAM;  Surgeon: Herby Abraham, MD;  Location[facility] CATH LAB;  Service: Cardiovascular;  Laterality: N/A;   LEFT HEART CATH N/A 08/23/2011   Procedure: LEFT HEART CATH;  Surgeon: Iran Ouch, MD;  Location: [facility] CATH LAB;  Service: Cardiovascular;  Laterality: N/A;   NO PAST SURGERIES     PERCUTANEOUS CORONARY STENT INTERVENTION (PCI-S)  08/23/2011   Procedure: PERCUTANEOUS CORONARY STENT INTERVENTION (PCI-S);  Surgeon: Iran Ouch, MD;  Location: [facility] CATH LAB;  Service: Cardiovascular;;     Allergies  Allergen Reactions   Flonase [Fluticasone Propionate] Other (See Comments)    Nose bleeds.      Family History  Problem Relation Age of Onset   Stroke Mother    Hypertension Father    Colon cancer Neg Hx    Pancreatic cancer Neg Hx      Social History Austin Bright reports that he quit smoking about 43 years ago. His smoking use included cigarettes. He started smoking about 68 years ago. He has a 25 pack-year smoking history. He has never used smokeless tobacco. Austin Bright reports current alcohol use of about 4.0 standard drinks of alcohol per week.   Review of Systems CONSTITUTIONAL: No weight loss, fever, chills, weakness or fatigue.  HEENT: Eyes: No visual loss, blurred vision, double vision or yellow sclerae.No hearing loss, sneezing, congestion, runny nose or sore throat.  SKIN: No rash or itching.  CARDIOVASCULAR: per hpi RESPIRATORY: No shortness of breath, cough or sputum.  GASTROINTESTINAL: No anorexia, nausea, vomiting or diarrhea. No abdominal pain or blood.  GENITOURINARY: No burning on urination, no polyuria NEUROLOGICAL: No headache, dizziness, syncope,  paralysis, ataxia, numbness or tingling in the extremities. No change in bowel or bladder control.  MUSCULOSKELETAL: No muscle, back pain, joint pain or stiffness.  LYMPHATICS: No enlarged nodes. No history of splenectomy.  PSYCHIATRIC: No history of depression or anxiety.  ENDOCRINOLOGIC: No reports of sweating, cold or heat intolerance. No polyuria or polydipsia.  Marland Kitchen   Physical Examination Today's Vitals   12/24/22 0946  BP: 128/76  Pulse: 79  SpO2: 97%  Weight: 216 lb (98 kg)  Height: 5\' 10"  (1.778 m)  PainSc: 0-No pain   Body mass index is 30.99  kg/m.  Gen: resting comfortably, no acute distress HEENT: no scleral icterus, pupils equal round and reactive, no palptable cervical adenopathy,  CV: RRR, no m/rg, no jvd Resp: Clear to auscultation bilaterally GI: abdomen is soft, non-tender, non-distended, normal bowel sounds, no hepatosplenomegaly MSK: extremities are warm, no edema.  Skin: warm, no rash Neuro:  no focal deficits Psych: appropriate affect   Diagnostic Studies 11/2011 Echo Study Conclusions  - Left ventricle: The cavity size was normal. Wall thickness was normal. Systolic function was normal. The estimated ejection fraction was in the range of 50% to 55%. There is hypokinesis of the mid-distalinferoseptal myocardium. There is hypokinesis of the distalanteroseptal myocardium. Features are consistent with a pseudonormal left ventricular filling pattern, with concomitant abnormal relaxation and increased filling pressure (grade 2 diastolic dysfunction). Doppler parameters are consistent with elevated ventricular end-diastolic filling pressure. - Aortic valve: Mildly calcified annulus. Trileaflet; mildly calcified leaflets. - Mitral valve: Trivial regurgitation. - Left atrium: The atrium was mildly dilated. - Right ventricle: The cavity size was mildly dilated. - Tricuspid valve: Trivial regurgitation. - Pulmonary arteries: PA peak pressure: 35mm Hg  (S). - Pericardium, extracardiac: There was no pericardial effusion.    Assessment and Plan  1. CAD - no recent symptoms, continue current meds - EKG today shows SR, LAFB, anterosetpal Qwaves. No acute ischemic changes     2. HTN - he is at goal, continue current meds   3. Hyperlipidemia -at goal, continue current meds   F/u 1 year      Antoine Poche, M.D.

## 2023-01-05 IMAGING — CT CT ABDOMEN WO/W CM
3 of 12 series · 9 of 46 positions shown, 15 images · IV contrast (Omnipaque or Isovue)
Comparison: CT abdomen pelvis, 08/17/2020

CLINICAL DATA: Follow-up pancreatic lesion

EXAM:
CT ABDOMEN WITHOUT AND WITH CONTRAST
TECHNIQUE: Multidetector CT imaging of the abdomen was performed following the
standard protocol before and following the bolus administration of
intravenous contrast.

[Series 4: axial arterial · axial · arterial · 0.88mm/px · z∈[+1115,+1196]mm · 2 of 83 slices shown]
[im 28/83  soft-tissue]
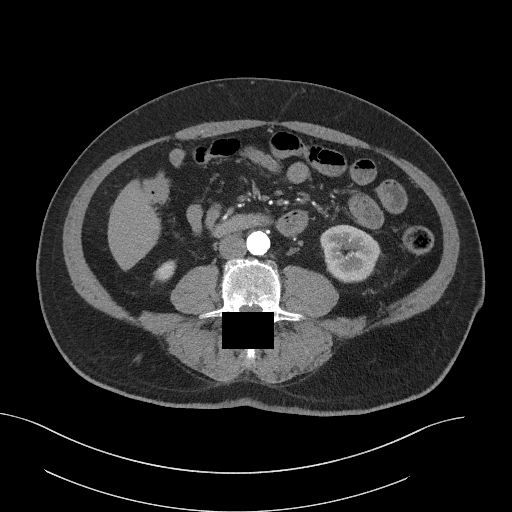
[im 55/83  soft-tissue]
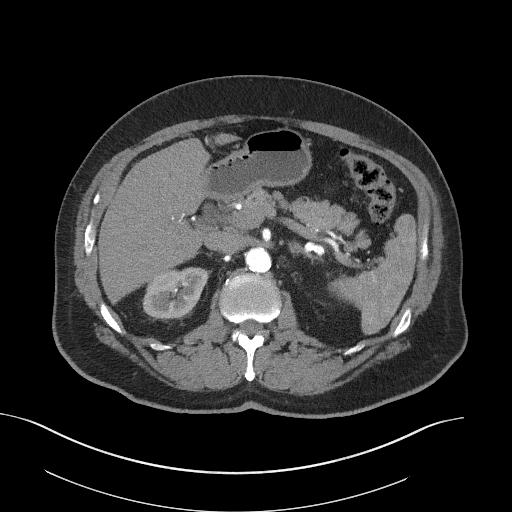

[Series 7: coronal arterial · coronal · arterial · 0.50mm/px · 3 of 101 slices shown, 4 images]
[im 26/101  soft-tissue]
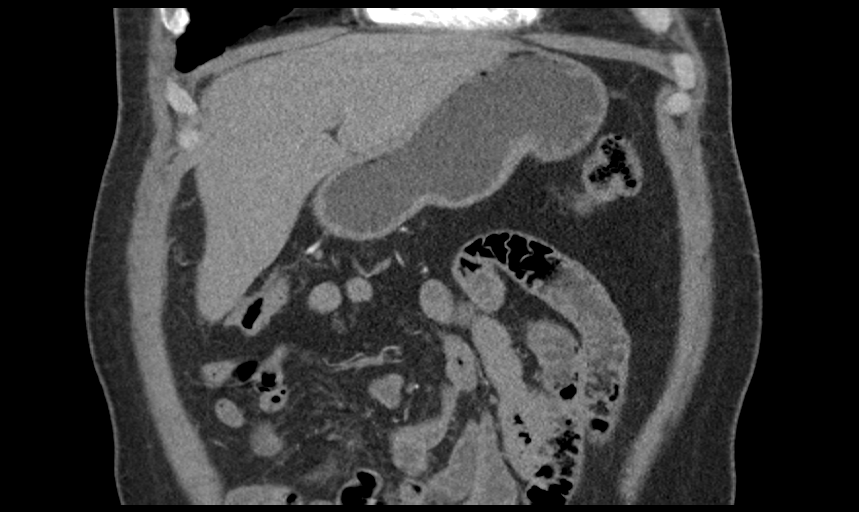
[im 51/101  soft-tissue]
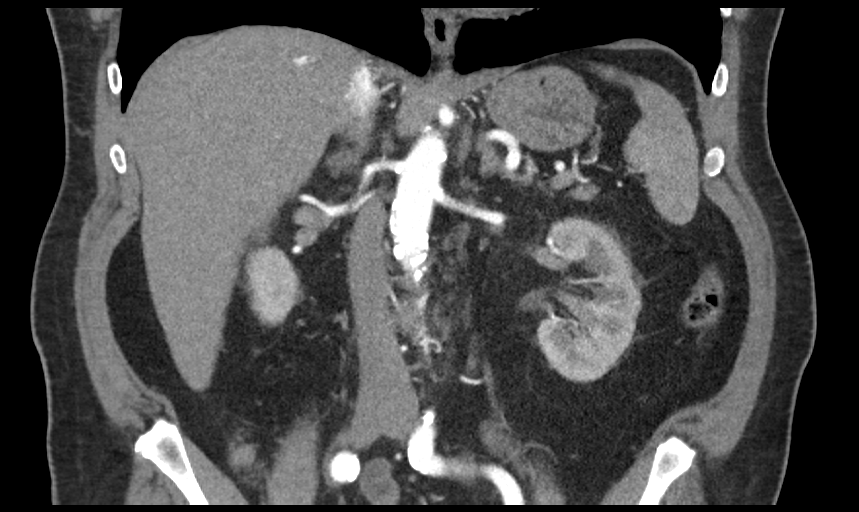
[im 51/101  bone]
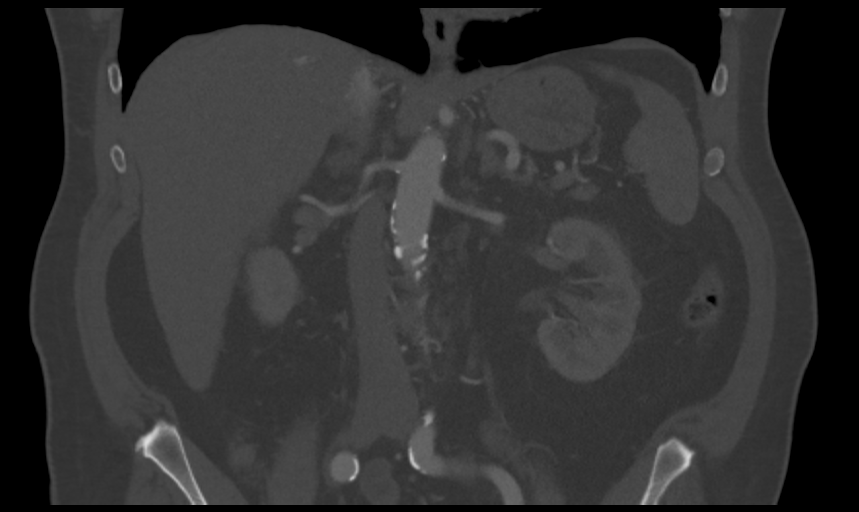
[im 76/101  soft-tissue]
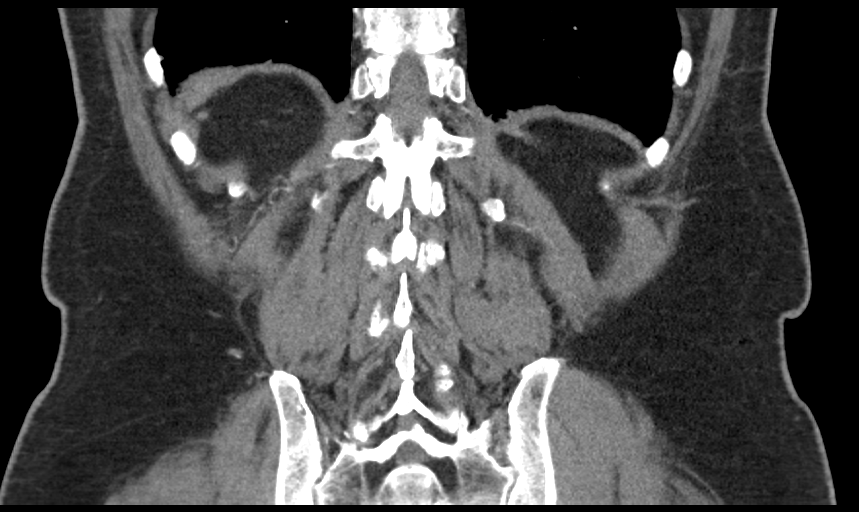

[Series 11: pancreas pv 2.0 · axial · portal-venous · 0.88mm/px · z∈[+1080,+1230]mm · 4 of 125 slices shown, 9 images]
[im 25/125  soft-tissue]
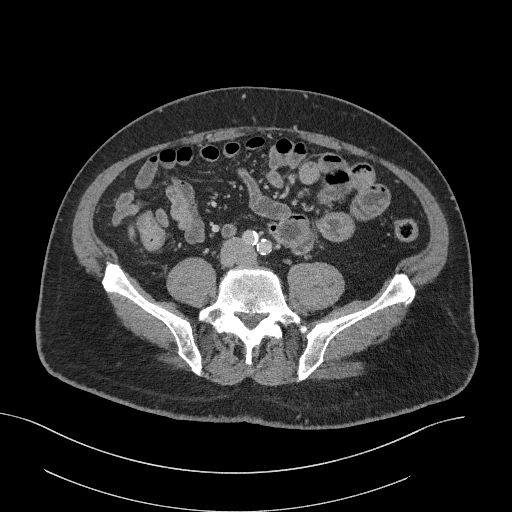
[im 25/125  lung]
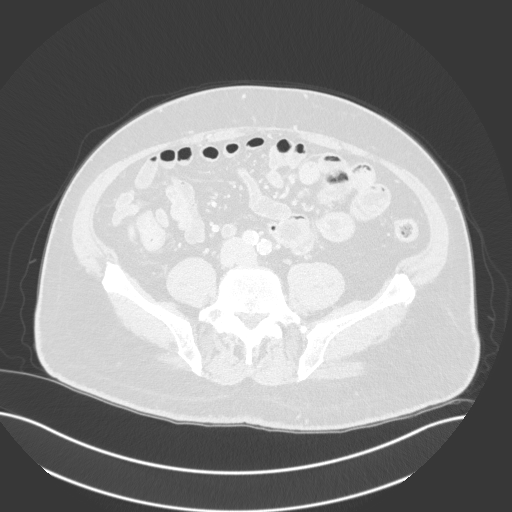
[im 25/125  bone]
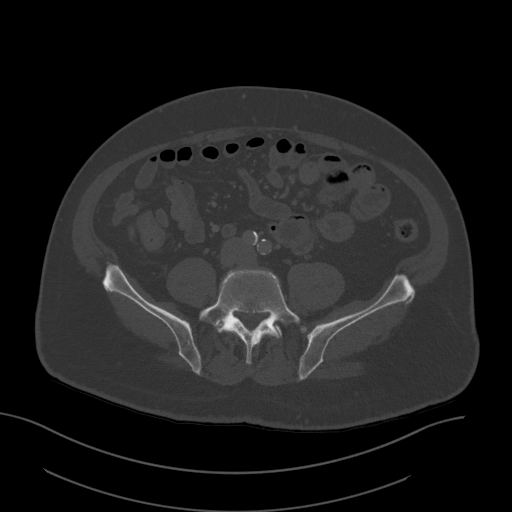
[im 50/125  soft-tissue]
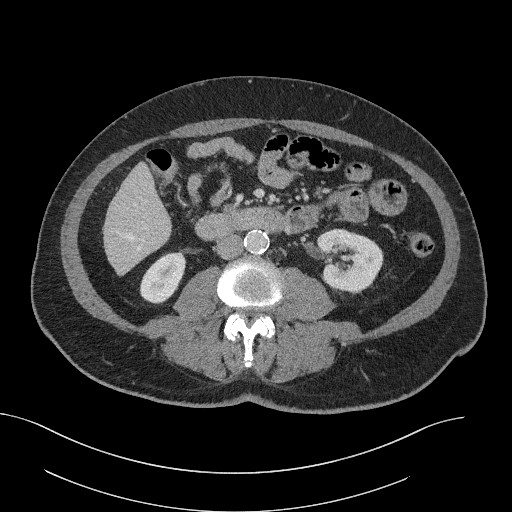
[im 50/125  lung]
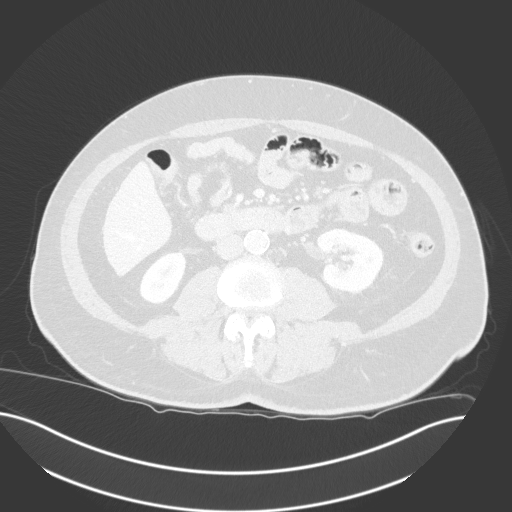
[im 75/125  soft-tissue]
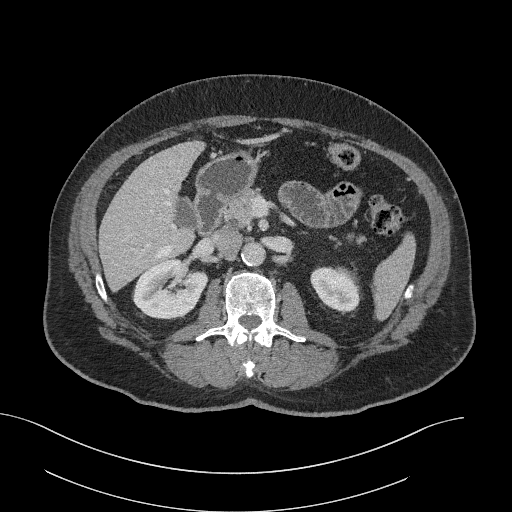
[im 75/125  lung]
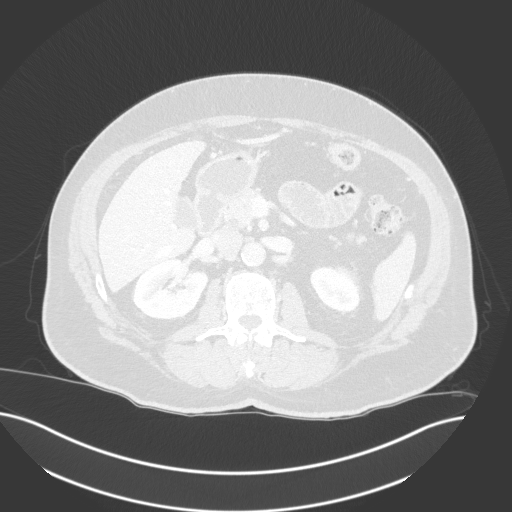
[im 100/125  soft-tissue]
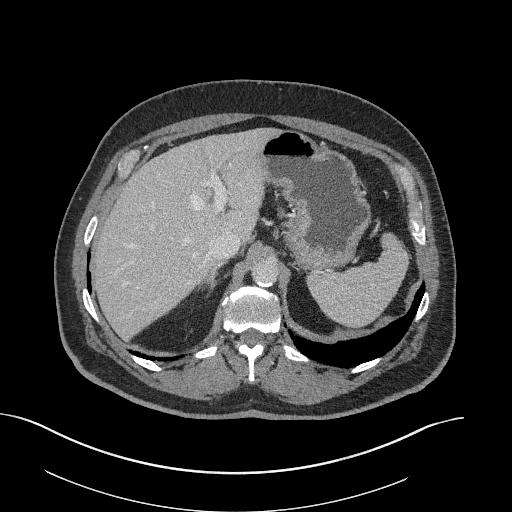
[im 100/125  lung]
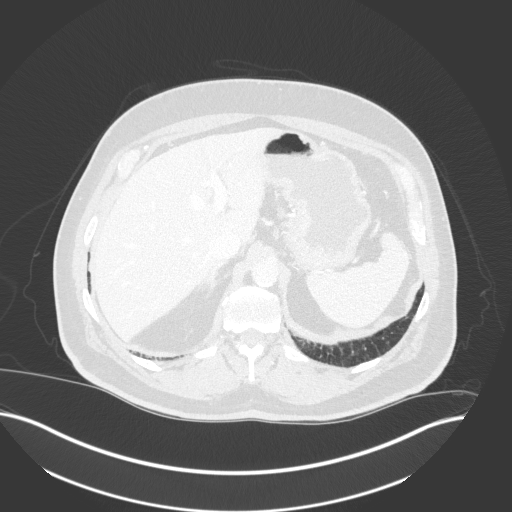

[9 of 46 positions shown; findings below may reference images not displayed]

RADIATION DOSE REDUCTION: This exam was performed according to the
departmental dose-optimization program which includes automated
exposure control, adjustment of the mA and/or kV according to
patient size and/or use of iterative reconstruction technique.

CONTRAST:  100mL OMNIPAQUE IOHEXOL 300 MG/ML  SOLN
FINDINGS: Lower chest: No acute abnormality.

Hepatobiliary: No focal liver abnormality is seen. No gallstones,
gallbladder wall thickening, or biliary dilatation.

Pancreas: Unchanged tiny, fat attenuation lesion of the ventral
pancreatic head measuring no greater than 0.3 cm (series 2, image
27). No pancreatic ductal dilatation or surrounding inflammatory
changes.

Spleen: Normal in size without focal abnormality.

Adrenals/Urinary Tract: Adrenal glands are unremarkable. Kidneys are
normal, without renal calculi, focal lesion, or hydronephrosis.

Stomach/Bowel: Stomach is within normal limits. Appendix appears
normal. No evidence of bowel wall thickening, distention, or
inflammatory changes.

Vascular/Lymphatic: Aortic atherosclerosis. No enlarged abdominal or
pelvic lymph nodes.

Other: No abdominal wall hernia or abnormality.

Musculoskeletal: No acute or significant osseous findings.
IMPRESSION: Unchanged tiny, fat attenuation lesion of the ventral pancreatic
head measuring no greater than 0.3 cm. This is consistent with a
small focus of fatty intercalation and definitively benign. No
further routine follow-up is required for this lesion.

Aortic Atherosclerosis (RLEE0-V1D.D).

## 2023-02-03 ENCOUNTER — Encounter: Payer: Self-pay | Admitting: Internal Medicine

## 2023-02-03 ENCOUNTER — Ambulatory Visit: Payer: Medicare PPO | Admitting: Internal Medicine

## 2023-02-03 VITALS — BP 128/70 | HR 87 | Ht 70.0 in | Wt 214.0 lb

## 2023-02-03 DIAGNOSIS — M67911 Unspecified disorder of synovium and tendon, right shoulder: Secondary | ICD-10-CM | POA: Insufficient documentation

## 2023-02-03 DIAGNOSIS — I2510 Atherosclerotic heart disease of native coronary artery without angina pectoris: Secondary | ICD-10-CM | POA: Diagnosis not present

## 2023-02-03 DIAGNOSIS — Z125 Encounter for screening for malignant neoplasm of prostate: Secondary | ICD-10-CM | POA: Diagnosis not present

## 2023-02-03 DIAGNOSIS — R7303 Prediabetes: Secondary | ICD-10-CM | POA: Diagnosis not present

## 2023-02-03 DIAGNOSIS — E559 Vitamin D deficiency, unspecified: Secondary | ICD-10-CM

## 2023-02-03 DIAGNOSIS — E782 Mixed hyperlipidemia: Secondary | ICD-10-CM

## 2023-02-03 DIAGNOSIS — I10 Essential (primary) hypertension: Secondary | ICD-10-CM

## 2023-02-03 MED ORDER — LISINOPRIL 20 MG PO TABS: 20.0000 mg | ORAL_TABLET | Freq: Every day | ORAL | 3 refills | Status: DC

## 2023-02-03 MED ORDER — ROSUVASTATIN CALCIUM 40 MG PO TABS: 40.0000 mg | ORAL_TABLET | Freq: Every day | ORAL | 3 refills | Status: DC

## 2023-02-03 NOTE — Assessment & Plan Note (Signed)
 BP Readings from Last 1 Encounters:  02/03/23 128/70   Well-controlled with Lisinopril , Chlorthalidone  and Metoprolol  Counseled for compliance with the medications Advised DASH diet and moderate exercise/walking, at least 150 mins/week

## 2023-02-03 NOTE — Progress Notes (Signed)
 Established Patient Office Visit  Subjective:  Patient ID: Austin Bright, male    DOB: 1945-11-12  Age: 78 y.o. MRN: [id number]  CC:  Chief Complaint  Patient presents with   Follow-up    Patient is here for f/u of HTN and CAD. Also complains of pain in his R shoulder from a fall in August.     HPI Austin Bright is a 78 y.o. male with past medical history of CAD s/p stent placement, HTN, HLD, gout and diverticulitis who presents for f/u of his chronic medical conditions.  HTN and CAD: BP is well-controlled. Takes medications regularly. Patient denies headache, dizziness, chest pain, dyspnea or palpitations.  He follows up with cardiology.  He has a history of stent placement, and takes aspirin  and statin.  He also takes metoprolol  and lisinopril .  Right shoulder/upper arm pain: He had a mechanical fall from his truck, landing on the left shoulder on 09/06/22.  He went to ER, had x-ray of left shoulder, showed no acute fracture or dislocation, but had chronic rotator cuff tendinopathy.  He has been taking Aleve  with relief of pain.  He has regained range of motion, but still has pain upon extension, especially in upper arm area.  Pain is constant, sharp, nonradiating.  Denies any numbness or weakness of UE.    Past Medical History:  Diagnosis Date   Acute diverticulitis 08/17/2020   Acute MI (HCC) 08/2011   Arthritis    Bilateral pneumonia    Diagnosed after STEMI 08/2011   CAD (coronary artery disease)    a. Diagnosed 08/2011 with anterior STEMI due to thrombotic occlusion of mid LAD s/p thrombectomy, PTCA, DES placement 08/23/11.   CHF (congestive heart failure) (HCC)    Chronic combined systolic and diastolic congestive heart failure (HCC) 09/15/2011   Dyslipidemia    Gout    Hypertension    Ischemic cardiomyopathy    a. Initial EF 35% by cath 08/23/11, improved to 40-45% by echo 08/25/11. 50-55% by echo November 2013.   Shortness of breath     Past Surgical History:   Procedure Laterality Date   carpel tunnel Bilateral 2002   COLONOSCOPY WITH PROPOFOL  N/A 04/08/2017   Surgeon: Suzette Espy, MD;  diverticulosis in sigmoid and descending colon, internal hemorrhoids, otherwise normal exam.  No recommendations to repeat due to age.   COLONOSCOPY WITH PROPOFOL  N/A 03/17/2021   Procedure: COLONOSCOPY WITH PROPOFOL ;  Surgeon: Vinetta Greening, DO;  Location: [facility];  Service: Endoscopy;  Laterality: N/A;  8:00am   CORONARY STENT PLACEMENT     CYSTECTOMY  1969   pilonidal cyst   LEFT AND RIGHT HEART CATHETERIZATION WITH CORONARY ANGIOGRAM N/A 09/15/2011   Procedure: LEFT AND RIGHT HEART CATHETERIZATION WITH CORONARY ANGIOGRAM;  Surgeon: Kristopher Pheasant, MD;  Location: [facility] CATH LAB;  Service: Cardiovascular;  Laterality: N/A;   LEFT HEART CATH N/A 08/23/2011   Procedure: LEFT HEART CATH;  Surgeon: Wenona Hamilton, MD;  Location: [facility] CATH LAB;  Service: Cardiovascular;  Laterality: N/A;   NO PAST SURGERIES     PERCUTANEOUS CORONARY STENT INTERVENTION (PCI-S)  08/23/2011   Procedure: PERCUTANEOUS CORONARY STENT INTERVENTION (PCI-S);  Surgeon: Wenona Hamilton, MD;  Location: [facility] CATH LAB;  Service: Cardiovascular;;    Family History  Problem Relation Age of Onset   Stroke Mother    Hypertension Father    Colon cancer Neg Hx    Pancreatic cancer Neg Hx  Social History   Socioeconomic History   Marital status: Married    Spouse name: Not on file   Number of children: Not on file   Years of education: Not on file   Highest education level: Master's degree (e.g., MA, MS, MEng, MEd, MSW, MBA)  Occupational History   Occupation: Child psychotherapist  Tobacco Use   Smoking status: Former    Current packs/day: 0.00    Average packs/day: 1 pack/day for 25.0 years (25.0 ttl pk-yrs)    Types: Cigarettes    Start date: 08/23/1954    Quit date: 08/23/1979    Years since quitting: 43.4   Smokeless tobacco: Never  Vaping Use   Vaping status: Some Days    Substances: Nicotine  Substance and Sexual Activity   Alcohol use: Yes    Alcohol/week: 4.0 standard drinks of alcohol    Types: 2 Cans of beer, 2 Shots of liquor per week    Comment: 1-2 drinks on the weekends   Drug use: No   Sexual activity: Yes  Other Topics Concern   Not on file  Social History Narrative   Widower since 73,married for 10 years.Lives alone,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      11/09/2022 Remarried. Wifes name is Austin Bright.    Social Drivers of Corporate investment banker Strain: Low Risk  (02/01/2023)   Overall Financial Resource Strain (CARDIA)    Difficulty of Paying Living Expenses: Not hard at all  Food Insecurity: No Food Insecurity (02/01/2023)   Hunger Vital Sign    Worried About Running Out of Food in the Last Year: Never true    Ran Out of Food in the Last Year: Never true  Transportation Needs: No Transportation Needs (02/01/2023)   PRAPARE - Administrator, Civil Service (Medical): No    Lack of Transportation (Non-Medical): No  Physical Activity: Insufficiently Active (02/01/2023)   Exercise Vital Sign    Days of Exercise per Week: 4 days    Minutes of Exercise per Session: 10 min  Stress: No Stress Concern Present (02/01/2023)   [employer] of Occupational Health - Occupational Stress Questionnaire    Feeling of Stress : Only a little  Social Connections: Moderately Isolated (02/01/2023)   Social Connection and Isolation Panel [NHANES]    Frequency of Communication with Friends and Family: Once a week    Frequency of Social Gatherings with Friends and Family: Once a week    Attends Religious Services: Never    Database administrator or Organizations: Yes    Attends Banker Meetings: 1 to 4 times per year    Marital Status: Married  Catering manager Violence: Not At Risk (11/09/2022)   Humiliation, Afraid, Rape, and Kick questionnaire    Fear of Current or Ex-Partner: No     Emotionally Abused: No    Physically Abused: No    Sexually Abused: No    Outpatient Medications Prior to Visit  Medication Sig Dispense Refill   Alpha Lipoic Acid 200 MG CAPS Take 200 mg by mouth daily.      aspirin  EC 81 MG tablet Take 81 mg by mouth daily.     chlorthalidone  (HYGROTON ) 25 MG tablet TAKE (1/2) TABLET BY MOUTH ONCE DAILY. 45 tablet 2   Coenzyme Q10 (CO Q-10) 100 MG CAPS Take 100 mg by mouth daily.      ezetimibe  (ZETIA ) 10 MG tablet Take 1 tablet (10 mg total) by mouth daily.  90 tablet 3   Green Tea 150 MG CAPS Take 150 mg by mouth daily.      metoprolol  succinate (TOPROL -XL) 25 MG 24 hr tablet TAKE 1 TABLET BY MOUTH ONCE DAILY. 90 tablet 3   nitroGLYCERIN  (NITROSTAT ) 0.4 MG SL tablet PLACE 1 TAB UNDER TONGUE EVERY 5 MIN IF NEEDED FOR CHEST PAIN. MAY USE 3 TIMES.NO RELIEF CALL 911. 25 tablet 3   sildenafil  (VIAGRA ) 100 MG tablet TAKE (1) TABLET BY MOUTH AS NEEDED FOR ERECTILE DYSFUNCTION. 6 tablet 0   traZODone  (DESYREL ) 50 MG tablet Take 0.5-1 tablets (25-50 mg total) by mouth at bedtime as needed for sleep. (Patient taking differently: Take 25 mg by mouth at bedtime as needed for sleep.) 30 tablet 3   Turmeric 500 MG CAPS Take 500 mg by mouth daily.      vitamin B-12 (CYANOCOBALAMIN ) 500 MCG tablet Take 1 tablet (500 mcg total) by mouth daily.     vitamin C  (ASCORBIC ACID ) 500 MG tablet Take 500 mg by mouth daily.     lisinopril  (ZESTRIL ) 20 MG tablet Take 1 tablet (20 mg total) by mouth daily. 90 tablet 3   rosuvastatin  (CRESTOR ) 40 MG tablet Take 1 tablet (40 mg total) by mouth daily. 90 tablet 3   amoxicillin -clavulanate (AUGMENTIN ) 875-125 MG tablet Take 1 tablet by mouth 2 (two) times daily. 14 tablet 0   doxycycline  (VIBRA -TABS) 100 MG tablet Take 1 tablet (100 mg total) by mouth 2 (two) times daily. 14 tablet 0   No facility-administered medications prior to visit.    Allergies  Allergen Reactions   Flonase  [Fluticasone  Propionate] Other (See Comments)     Nose bleeds.    ROS Review of Systems  Constitutional:  Negative for chills and fever.  HENT:  Negative for congestion and sore throat.   Eyes:  Negative for pain and discharge.  Respiratory:  Negative for cough and shortness of breath.   Cardiovascular:  Negative for chest pain and palpitations.  Gastrointestinal:  Negative for constipation, diarrhea, nausea and vomiting.  Endocrine: Negative for polydipsia and polyuria.  Genitourinary:  Negative for dysuria and hematuria.  Musculoskeletal:  Negative for neck pain and neck stiffness.  Skin:  Positive for color change. Negative for rash.  Neurological:  Negative for dizziness, weakness, numbness and headaches.  Psychiatric/Behavioral:  Negative for agitation and behavioral problems.       Objective:    Physical Exam Vitals reviewed.  Constitutional:      General: He is not in acute distress.    Appearance: He is not diaphoretic.  HENT:     Head: Normocephalic and atraumatic.     Nose: Nose normal.     Mouth/Throat:     Mouth: Mucous membranes are moist.  Eyes:     General: No scleral icterus.    Extraocular Movements: Extraocular movements intact.  Cardiovascular:     Rate and Rhythm: Normal rate and regular rhythm.     Pulses: Normal pulses.     Heart sounds: Normal heart sounds. No murmur heard. Pulmonary:     Breath sounds: Normal breath sounds. No wheezing or rales.  Musculoskeletal:     Right shoulder: No tenderness. Normal range of motion.     Right upper arm: Tenderness present.     Cervical back: Neck supple. No tenderness.     Right lower leg: No edema.     Left lower leg: No edema.  Skin:    General: Skin is warm.  Findings: No rash.  Neurological:     General: No focal deficit present.     Mental Status: He is alert and oriented to person, place, and time.     Sensory: No sensory deficit.     Motor: No weakness.  Psychiatric:        Mood and Affect: Mood normal.        Behavior: Behavior normal.      BP 128/70   Pulse 87   Ht 5\' 10"  (1.778 m)   Wt 214 lb (97.1 kg)   SpO2 93%   BMI 30.71 kg/m  Wt Readings from Last 3 Encounters:  02/03/23 214 lb (97.1 kg)  12/24/22 216 lb (98 kg)  11/09/22 210 lb (95.3 kg)    Lab Results  Component Value Date   TSH 1.440 08/21/2022   Lab Results  Component Value Date   WBC 7.6 08/21/2022   HGB 15.9 08/21/2022   HCT 46.6 08/21/2022   MCV 96 08/21/2022   PLT 181 08/21/2022   Lab Results  Component Value Date   NA 142 08/21/2022   K 4.0 08/21/2022   CO2 26 08/21/2022   GLUCOSE 113 (H) 08/21/2022   BUN 14 08/21/2022   CREATININE 1.23 08/21/2022   BILITOT 0.7 08/21/2022   ALKPHOS 71 08/21/2022   AST 26 08/21/2022   ALT 23 08/21/2022   PROT 6.8 08/21/2022   ALBUMIN 4.6 08/21/2022   CALCIUM  9.7 08/21/2022   ANIONGAP 11 08/19/2020   EGFR 61 08/21/2022   Lab Results  Component Value Date   CHOL 128 08/21/2022   Lab Results  Component Value Date   HDL 55 08/21/2022   Lab Results  Component Value Date   LDLCALC 59 08/21/2022   Lab Results  Component Value Date   TRIG 65 08/21/2022   Lab Results  Component Value Date   CHOLHDL 2.3 08/21/2022   Lab Results  Component Value Date   HGBA1C 5.9 (H) 08/21/2022      Assessment & Plan:   Problem List Items Addressed This Visit       Cardiovascular and Mediastinum   Coronary artery disease involving native coronary artery of native heart without angina pectoris   On Aspirin , statin and Zetia  On Metoprolol  and Lisinopril  Had ischemic CM, but LVEF has improved now. Followed by Cardiology      Relevant Medications   lisinopril  (ZESTRIL ) 20 MG tablet   rosuvastatin  (CRESTOR ) 40 MG tablet   Essential hypertension - Primary   BP Readings from Last 1 Encounters:  02/03/23 128/70   Well-controlled with Lisinopril , Chlorthalidone  and Metoprolol  Counseled for compliance with the medications Advised DASH diet and moderate exercise/walking, at least 150 mins/week       Relevant Medications   lisinopril  (ZESTRIL ) 20 MG tablet   rosuvastatin  (CRESTOR ) 40 MG tablet   Other Relevant Orders   TSH   CMP14+EGFR   CBC with Differential/Platelet     Musculoskeletal and Integument   Tendinopathy of right rotator cuff   X-ray of right shoulder reviewed from ER visit Likely has chronic right rotator cuff tendinopathy, may have been worse with recent fall Advised to avoid Aleve  as he takes aspirin  and has history of CAD, can take Tylenol  arthritis instead Referred to occupational therapy If persistent pain, will refer to orthopedic surgery      Relevant Orders   Ambulatory referral to Occupational Therapy     Other   Prostate cancer screening   Ordered PSA after discussing its limitations  for prostate cancer screening, including false positive results leading to additional investigations.      Relevant Orders   PSA   Prediabetes   Lab Results  Component Value Date   HGBA1C 5.9 (H) 08/21/2022   Advised to follow DASH diet for now      Relevant Orders   Hemoglobin A1c   CMP14+EGFR   Vitamin D  deficiency   Advised to take Vitamin D  2000 IU QD      Relevant Orders   VITAMIN D  25 Hydroxy (Vit-D Deficiency, Fractures)   Mixed hyperlipidemia   On Crestor  and Zetia       Relevant Medications   lisinopril  (ZESTRIL ) 20 MG tablet   rosuvastatin  (CRESTOR ) 40 MG tablet   Other Relevant Orders   Lipid panel     Meds ordered this encounter  Medications   lisinopril  (ZESTRIL ) 20 MG tablet    Sig: Take 1 tablet (20 mg total) by mouth daily.    Dispense:  90 tablet    Refill:  3   rosuvastatin  (CRESTOR ) 40 MG tablet    Sig: Take 1 tablet (40 mg total) by mouth daily.    Dispense:  90 tablet    Refill:  3    Follow-up: Return in about 6 months (around 08/03/2023) for Annual physical.    Meldon Sport, MD

## 2023-02-03 NOTE — Assessment & Plan Note (Signed)
 X-ray of right shoulder reviewed from ER visit Likely has chronic right rotator cuff tendinopathy, may have been worse with recent fall Advised to avoid Aleve  as he takes aspirin  and has history of CAD, can take Tylenol  arthritis instead Referred to occupational therapy If persistent pain, will refer to orthopedic surgery

## 2023-02-03 NOTE — Assessment & Plan Note (Signed)
On Aspirin, statin and Zetia On Metoprolol and Lisinopril Had ischemic CM, but LVEF has improved now. Followed by Cardiology

## 2023-02-03 NOTE — Assessment & Plan Note (Signed)
 Ordered PSA after discussing its limitations for prostate cancer screening, including false positive results leading to additional investigations.

## 2023-02-03 NOTE — Assessment & Plan Note (Signed)
Advised to take Vitamin D 2000 IU QD °

## 2023-02-03 NOTE — Assessment & Plan Note (Signed)
 Lab Results  Component Value Date   HGBA1C 5.9 (H) 08/21/2022   Advised to follow DASH diet for now

## 2023-02-03 NOTE — Assessment & Plan Note (Signed)
On Crestor and Zetia

## 2023-02-03 NOTE — Patient Instructions (Addendum)
 Please continue to take medications as prescribed.  Please continue to follow DASH diet and perform moderate exercise/walking at least 150 mins/week.  Please get fasting blood tests done before the next visit.

## 2023-02-06 ENCOUNTER — Other Ambulatory Visit: Payer: Self-pay | Admitting: Cardiology

## 2023-02-09 ENCOUNTER — Encounter (HOSPITAL_COMMUNITY): Payer: Self-pay | Admitting: Occupational Therapy

## 2023-02-09 ENCOUNTER — Ambulatory Visit (HOSPITAL_COMMUNITY): Payer: Medicare PPO | Attending: Internal Medicine | Admitting: Occupational Therapy

## 2023-02-09 DIAGNOSIS — M25511 Pain in right shoulder: Secondary | ICD-10-CM | POA: Insufficient documentation

## 2023-02-09 DIAGNOSIS — R29898 Other symptoms and signs involving the musculoskeletal system: Secondary | ICD-10-CM | POA: Insufficient documentation

## 2023-02-09 DIAGNOSIS — M67911 Unspecified disorder of synovium and tendon, right shoulder: Secondary | ICD-10-CM | POA: Insufficient documentation

## 2023-02-09 DIAGNOSIS — M25611 Stiffness of right shoulder, not elsewhere classified: Secondary | ICD-10-CM | POA: Diagnosis not present

## 2023-02-09 NOTE — Patient Instructions (Signed)

## 2023-02-09 NOTE — Therapy (Signed)
OUTPATIENT OCCUPATIONAL THERAPY ORTHO EVALUATION  Patient Name: Austin Bright MRN: [id number] DOB:01-Dec-1945, 78 y.o., male Today's Date: 02/09/2023   END OF SESSION:  OT End of Session - 02/09/23 1011     Visit Number 1    Number of Visits 7    Date for OT Re-Evaluation 03/26/23    Authorization Type [employer] - Requesting 6 visits    OT Start Time [phone]    OT Stop Time [phone]    OT Time Calculation (min) 31 min    Activity Tolerance Patient tolerated treatment well    Behavior During Therapy [facility] for tasks assessed/performed             Past Medical History:  Diagnosis Date   Acute diverticulitis 08/17/2020   Acute MI (HCC) 08/2011   Arthritis    Bilateral pneumonia    Diagnosed after STEMI 08/2011   CAD (coronary artery disease)    a. Diagnosed 08/2011 with anterior STEMI due to thrombotic occlusion of mid LAD s/p thrombectomy, PTCA, DES placement 08/23/11.   CHF (congestive heart failure) (HCC)    Chronic combined systolic and diastolic congestive heart failure (HCC) 09/15/2011   Dyslipidemia    Gout    Hypertension    Ischemic cardiomyopathy    a. Initial EF 35% by cath 08/23/11, improved to 40-45% by echo 08/25/11. 50-55% by echo November 2013.   Shortness of breath    Past Surgical History:  Procedure Laterality Date   carpel tunnel Bilateral 2002   COLONOSCOPY WITH PROPOFOL N/A 04/08/2017   Surgeon: Corbin Ade, MD;  diverticulosis in sigmoid and descending colon, internal hemorrhoids, otherwise normal exam.  No recommendations to repeat due to age.   COLONOSCOPY WITH PROPOFOL N/A 03/17/2021   Procedure: COLONOSCOPY WITH PROPOFOL;  Surgeon: Lanelle Bal, DO;  [facility];  Service: Endoscopy;  Laterality: N/A;  8:00am   CORONARY STENT PLACEMENT     CYSTECTOMY  1969   pilonidal cyst   LEFT AND RIGHT HEART CATHETERIZATION WITH CORONARY ANGIOGRAM N/A 09/15/2011   Procedure: LEFT AND RIGHT HEART CATHETERIZATION WITH CORONARY ANGIOGRAM;  Surgeon:  Herby Abraham, MD;  Location: [facility] CATH LAB;  Service: Cardiovascular;  Laterality: N/A;   LEFT HEART CATH N/A 08/23/2011   Procedure: LEFT HEART CATH;  Surgeon: Iran Ouch, MD;  Location: [facility] CATH LAB;  Service: Cardiovascular;  Laterality: N/A;   NO PAST SURGERIES     PERCUTANEOUS CORONARY STENT INTERVENTION (PCI-S)  08/23/2011   Procedure: PERCUTANEOUS CORONARY STENT INTERVENTION (PCI-S);  Surgeon: Iran Ouch, MD;  Location: [facility] CATH LAB;  Service: Cardiovascular;;   Patient Active Problem List   Diagnosis Date Noted   Tendinopathy of right rotator cuff 02/03/2023   Mixed hyperlipidemia 01/23/2022   Encounter for general adult medical examination with abnormal findings 07/18/2021   Prediabetes 07/18/2021   Vitamin D deficiency 07/18/2021   History of diverticulitis 02/24/2021   Pancreatic lesion 02/24/2021   Prostate cancer screening 01/30/2021   Primary insomnia 01/27/2021   Hepatomegaly    Essential hypertension 08/18/2020   Idiopathic gout 11/16/2018   Old anteroseptal myocardial infarction 08/24/2011   Coronary artery disease involving native coronary artery of native heart without angina pectoris 08/24/2011    PCP: Trena Platt, MD REFERRING PROVIDER: Trena Platt, MD  ONSET DATE: 09/01/22  REFERRING DIAG: R shoulder Pain/ Bicep Tendonitis  THERAPY DIAG:  Right shoulder pain, unspecified chronicity - Plan: Ot plan of care cert/re-cert  Shoulder stiffness, right - Plan:  Ot plan of care cert/re-cert  Other symptoms and signs involving the musculoskeletal system - Plan: Ot plan of care cert/re-cert  Rationale for Evaluation and Treatment: Rehabilitation  SUBJECTIVE:   SUBJECTIVE STATEMENT: "It just gets rough when I move it." Pt accompanied by: self  PERTINENT HISTORY: Pt had a fall in August 2024, resulting in pain in the deltoid, limited ROM, and weakness. PMH significant for CAD s/p stent placement, HTN, HLD, gout and diverticulitis.  PRECAUTIONS:  None  WEIGHT BEARING RESTRICTIONS: No  PAIN:  Are you having pain? No and intermittent pain up to 5/10 with most movements  FALLS: Has patient fallen in last 6 months? Yes. Number of falls 1  PLOF: Independent  PATIENT GOALS: To eliminate the pain  NEXT MD VISIT: None  OBJECTIVE:   HAND DOMINANCE: Left  ADLs: Overall ADLs: Pt having severe pain with all ADL's, as well as lifting and carrying items. Pt unable to reach behind his back for hygiene and pulling up the back of his pants.   FUNCTIONAL OUTCOME MEASURES: FOTO: 60.86  UPPER EXTREMITY ROM:       Assessed in seated, er/IR adducted  Active ROM Right eval  Shoulder flexion 146  Shoulder abduction 154  Shoulder internal rotation 90  Shoulder external rotation 55  (Blank rows = not tested)    UPPER EXTREMITY MMT:     Assessed in seated, er/IR adducted  MMT Right eval  Shoulder flexion 4-/5  Shoulder abduction 4/5  Shoulder internal rotation 4+/5  Shoulder external rotation 4/5  (Blank rows = not tested)  SENSATION: WFL  EDEMA: No swelling noted  OBSERVATIONS: Moderate to severe fascial restrictions along the deltoid, bicep tendon, and subscapular region.   TODAY'S TREATMENT:                                                                                                                              DATE: 02/09/23: Evaluation, A/ROM    PATIENT EDUCATION: Education details: A/ROM Person educated: Patient Education method: Programmer, multimedia, Facilities manager, and Handouts Education comprehension: verbalized understanding and returned demonstration  HOME EXERCISE PROGRAM: 1/21: A/ROM  GOALS: Goals reviewed with patient? Yes   SHORT TERM GOALS: Target date: 03/19/23  Pt will be provided with and educated on HEP to improve mobility in RUE required for use during ADL completion.   Goal status: INITIAL  Pt will decrease pain in RUE to 3/10 or less to improve ability to sleep for 2+ consecutive hours  without waking due to pain.   Goal status: INITIAL  2.  Pt will decrease RUE fascial restrictions to min amounts or less to improve mobility required for functional reaching tasks.   Goal status: INITIAL  3.  Pt will increase RUE A/ROM by 15 degrees to improve ability to use RUE when reaching overhead or behind back during dressing and bathing tasks.   Goal status: INITIAL  4.  Pt will increase RUE strength to 4+/5 or greater to  improve ability to use RUE when lifting or carrying items during meal preparation/housework/yardwork tasks.   Goal status: INITIAL  5.  Pt will return to highest level of function using RUE as non-dominant during functional task completion.   Goal status: INITIAL   ASSESSMENT:  CLINICAL IMPRESSION: Patient is a 78 y.o. male who was seen today for occupational therapy evaluation for R bicep tendonitis. Pt presents with increased pain and fascial restrictions, decreased ROM, strength, and functional use of the RUE.   PERFORMANCE DEFICITS: in functional skills including in functional skills including ADLs, IADLs, coordination, tone, ROM, strength, pain, fascial restrictions, muscle spasms, and UE functional use.Marland Kitchen   IMPAIRMENTS: are limiting patient from ADLs, IADLs, rest and sleep, work, leisure, and social participation.   COMORBIDITIES: has no other co-morbidities that affects occupational performance. Patient will benefit from skilled OT to address above impairments and improve overall function.  MODIFICATION OR ASSISTANCE TO COMPLETE EVALUATION: No modification of tasks or assist necessary to complete an evaluation.  OT OCCUPATIONAL PROFILE AND HISTORY: Problem focused assessment: Including review of records relating to presenting problem.  CLINICAL DECISION MAKING: LOW - limited treatment options, no task modification necessary  REHAB POTENTIAL: Good  EVALUATION COMPLEXITY: Low      PLAN:  OT FREQUENCY: 1x/week  OT DURATION: 6  weeks  PLANNED INTERVENTIONS: 97168 OT Re-evaluation, 97535 self care/ADL training, 16109 therapeutic exercise, 97530 therapeutic activity, 97140 manual therapy, 97035 ultrasound, 97010 moist heat, 97032 electrical stimulation (manual), passive range of motion, functional mobility training, energy conservation, coping strategies training, patient/family education, and DME and/or AE instructions  RECOMMENDED OTHER SERVICES: N/A  CONSULTED AND AGREED WITH PLAN OF CARE: Patient  PLAN FOR NEXT SESSION: Manual Therapy, A/ROM, Proximal Shoulder exercises, Isometrics, possible K-tape trial   Trish Mage, OTR/L [facility] [phone] Jernie Schutt Rosemarie Beath, OT 02/09/2023, 10:13 AM   Ethlyn Gallery Request  Referring diagnosis code (ICD 10)? M75.21 Treatment diagnosis codes (ICD 10)? (if different than referring diagnosis) M25.511, M25.611, R29.898 What was this (referring dx) caused by? []  Surgery [x]  Fall []  Ongoing issue []  Arthritis []  Other: ____________  Laterality: [x]  Rt []  Lt []  Both  Deficits: [x]  Pain [x]  Stiffness [x]  Weakness []  Edema []  Balance Deficits []  Coordination []  Gait Disturbance [x]  ROM []  Other   Functional Tool Score: FOTO: 60/100  CPT codes: See Planned Interventions listed in the Plan section of the Evaluation.

## 2023-02-17 ENCOUNTER — Ambulatory Visit (HOSPITAL_COMMUNITY): Payer: Medicare PPO | Admitting: Occupational Therapy

## 2023-02-17 ENCOUNTER — Encounter (HOSPITAL_COMMUNITY): Payer: Self-pay | Admitting: Occupational Therapy

## 2023-02-17 DIAGNOSIS — M25511 Pain in right shoulder: Secondary | ICD-10-CM | POA: Diagnosis not present

## 2023-02-17 DIAGNOSIS — M25611 Stiffness of right shoulder, not elsewhere classified: Secondary | ICD-10-CM

## 2023-02-17 DIAGNOSIS — M67911 Unspecified disorder of synovium and tendon, right shoulder: Secondary | ICD-10-CM | POA: Diagnosis not present

## 2023-02-17 DIAGNOSIS — R29898 Other symptoms and signs involving the musculoskeletal system: Secondary | ICD-10-CM | POA: Diagnosis not present

## 2023-02-17 NOTE — Therapy (Signed)
OUTPATIENT OCCUPATIONAL THERAPY ORTHO TREATMENT NOTE  Patient Name: Austin Bright MRN: [id number] DOB:07-15-1945, 78 y.o., male Today's Date: 02/17/2023   END OF SESSION:  OT End of Session - 02/17/23 1015     Visit Number 2    Number of Visits 7    Date for OT Re-Evaluation 03/26/23    Authorization Type [employer]    Authorization Time Period 6 visits approved and 1 re-eval (02/09/23-03/26/23)    Authorization - Visit Number 1    Authorization - Number of Visits 6    OT Start Time 0935    OT Stop Time 1015    OT Time Calculation (min) 40 min    Activity Tolerance Patient tolerated treatment well    Behavior During Therapy [facility] for tasks assessed/performed             Past Medical History:  Diagnosis Date   Acute diverticulitis 08/17/2020   Acute MI (HCC) 08/2011   Arthritis    Bilateral pneumonia    Diagnosed after STEMI 08/2011   CAD (coronary artery disease)    a. Diagnosed 08/2011 with anterior STEMI due to thrombotic occlusion of mid LAD s/p thrombectomy, PTCA, DES placement 08/23/11.   CHF (congestive heart failure) (HCC)    Chronic combined systolic and diastolic congestive heart failure (HCC) 09/15/2011   Dyslipidemia    Gout    Hypertension    Ischemic cardiomyopathy    a. Initial EF 35% by cath 08/23/11, improved to 40-45% by echo 08/25/11. 50-55% by echo November 2013.   Shortness of breath    Past Surgical History:  Procedure Laterality Date   carpel tunnel Bilateral 2002   COLONOSCOPY WITH PROPOFOL N/A 04/08/2017   Bright: Corbin Ade, MD;  diverticulosis in sigmoid and descending colon, internal hemorrhoids, otherwise normal exam.  No recommendations to repeat due to age.   COLONOSCOPY WITH PROPOFOL N/A 03/17/2021   Procedure: COLONOSCOPY WITH PROPOFOL;  Bright: Lanelle Bal, DO;  [facility];  Service: Endoscopy;  Laterality: N/A;  8:00am   CORONARY STENT PLACEMENT     CYSTECTOMY  1969   pilonidal cyst   LEFT AND RIGHT HEART  CATHETERIZATION WITH CORONARY ANGIOGRAM N/A 09/15/2011   Procedure: LEFT AND RIGHT HEART CATHETERIZATION WITH CORONARY ANGIOGRAM;  Bright: Herby Abraham, MD;  Location: [facility] CATH LAB;  Service: Cardiovascular;  Laterality: N/A;   LEFT HEART CATH N/A 08/23/2011   Procedure: LEFT HEART CATH;  Bright: Iran Ouch, MD;  Location: [facility] CATH LAB;  Service: Cardiovascular;  Laterality: N/A;   NO PAST SURGERIES     PERCUTANEOUS CORONARY STENT INTERVENTION (PCI-S)  08/23/2011   Procedure: PERCUTANEOUS CORONARY STENT INTERVENTION (PCI-S);  Bright: Iran Ouch, MD;  Location: [facility] CATH LAB;  Service: Cardiovascular;;   Patient Active Problem List   Diagnosis Date Noted   Tendinopathy of right rotator cuff 02/03/2023   Mixed hyperlipidemia 01/23/2022   Encounter for general adult medical examination with abnormal findings 07/18/2021   Prediabetes 07/18/2021   Vitamin D deficiency 07/18/2021   History of diverticulitis 02/24/2021   Pancreatic lesion 02/24/2021   Prostate cancer screening 01/30/2021   Primary insomnia 01/27/2021   Hepatomegaly    Essential hypertension 08/18/2020   Idiopathic gout 11/16/2018   Old anteroseptal myocardial infarction 08/24/2011   Coronary artery disease involving native coronary artery of native heart without angina pectoris 08/24/2011    PCP: Trena Platt, MD REFERRING PROVIDER: Trena Platt, MD  ONSET DATE: 09/01/22  REFERRING DIAG:  R shoulder Pain/ Bicep Tendonitis  THERAPY DIAG:  Right shoulder pain, unspecified chronicity  Shoulder stiffness, right  Other symptoms and signs involving the musculoskeletal system  Rationale for Evaluation and Treatment: Rehabilitation  SUBJECTIVE:   SUBJECTIVE STATEMENT: "I've really been feeling it on the outside of my arm." Pt accompanied by: self  PERTINENT HISTORY: Pt had a fall in August 2024, resulting in pain in the deltoid, limited ROM, and weakness. PMH significant for CAD s/p stent placement,  HTN, HLD, gout and diverticulitis.  PRECAUTIONS: None  WEIGHT BEARING RESTRICTIONS: No  PAIN:  Are you having pain? No and intermittent pain up to 5/10 with most movements  FALLS: Has patient fallen in last 6 months? Yes. Number of falls 1  PLOF: Independent  PATIENT GOALS: To eliminate the pain  NEXT MD VISIT: None  OBJECTIVE:   HAND DOMINANCE: Left  ADLs: Overall ADLs: Pt having severe pain with all ADL's, as well as lifting and carrying items. Pt unable to reach behind his back for hygiene and pulling up the back of his pants.   FUNCTIONAL OUTCOME MEASURES: FOTO: 60.86  UPPER EXTREMITY ROM:       Assessed in seated, er/IR adducted  Active ROM Right eval  Shoulder flexion 146  Shoulder abduction 154  Shoulder internal rotation 90  Shoulder external rotation 55  (Blank rows = not tested)    UPPER EXTREMITY MMT:     Assessed in seated, er/IR adducted  MMT Right eval  Shoulder flexion 4-/5  Shoulder abduction 4/5  Shoulder internal rotation 4+/5  Shoulder external rotation 4/5  (Blank rows = not tested)  SENSATION: WFL  EDEMA: No swelling noted  OBSERVATIONS: Moderate to severe fascial restrictions along the deltoid, bicep tendon, and subscapular region.   TODAY'S TREATMENT:                                                                                                                              DATE:  02/17/23:  -Manual Therapy: myofascial release and trigger point applied to biceps, deltoid, scapular region, and trapezius, in order to reduce pain and fascial restrictions, as well as improve ROM. -A/ROM: seated, flexion, abduction, protraction, horizontal abduction, er/IR, x10 -Proximal Shoulder Exercises: paddles, criss cross, circles both directions, x10 each -PNF Strengthening: chest pulls, overhead pulls, er pulls, PNF up, PNF down, x10 -Stretches: cross chest pull, behind the back pull, flexion on the wall, er in the corner,  5x10"   PATIENT EDUCATION: Education details: Social worker and PNF strengthening Person educated: Patient Education method: Programmer, multimedia, Facilities manager, and Handouts Education comprehension: verbalized understanding and returned demonstration  HOME EXERCISE PROGRAM: 1/21: A/ROM 1/29: Stretches and PNF strengthening   GOALS: Goals reviewed with patient? Yes   SHORT TERM GOALS: Target date: 03/19/23  Pt will be provided with and educated on HEP to improve mobility in RUE required for use during ADL completion.   Goal status: IN PROGRESS  Pt will decrease pain in RUE  to 3/10 or less to improve ability to sleep for 2+ consecutive hours without waking due to pain.   Goal status: IN PROGRESS  2.  Pt will decrease RUE fascial restrictions to min amounts or less to improve mobility required for functional reaching tasks.   Goal status: IN PROGRESS  3.  Pt will increase RUE A/ROM by 15 degrees to improve ability to use RUE when reaching overhead or behind back during dressing and bathing tasks.   Goal status: IN PROGRESS  4.  Pt will increase RUE strength to 4+/5 or greater to improve ability to use RUE when lifting or carrying items during meal preparation/housework/yardwork tasks.   Goal status: IN PROGRESS  5.  Pt will return to highest level of function using RUE as non-dominant during functional task completion.   Goal status: IN PROGRESS   ASSESSMENT:  CLINICAL IMPRESSION: Pt presenting to first OT session with increased pain and fascial restrictions, addressed with manual therapy, as well as ROM exercises. He is able to continue achieving 80% to full ROM, however has noted increase in pain during abduction and horizontal abduction. OT added stretches to ease the severity of pull along the deltoid muscle, in combination with shoulder strengthening exercises. Verbal and tactile cuing provided throughout session for positioning and technique.    PERFORMANCE DEFICITS: in  functional skills including in functional skills including ADLs, IADLs, coordination, tone, ROM, strength, pain, fascial restrictions, muscle spasms, and UE functional use.Marland Kitchen    PLAN:  OT FREQUENCY: 1x/week  OT DURATION: 6 weeks  PLANNED INTERVENTIONS: 97168 OT Re-evaluation, 97535 self care/ADL training, 25366 therapeutic exercise, 97530 therapeutic activity, 97140 manual therapy, 97035 ultrasound, 97010 moist heat, 97032 electrical stimulation (manual), passive range of motion, functional mobility training, energy conservation, coping strategies training, patient/family education, and DME and/or AE instructions  RECOMMENDED OTHER SERVICES: N/A  CONSULTED AND AGREED WITH PLAN OF CARE: Patient  PLAN FOR NEXT SESSION: Manual Therapy, A/ROM, Proximal Shoulder exercises, Isometrics, possible K-tape trial   Trish Mage, OTR/L [facility] [phone] Keilana Morlock Rosemarie Beath, OT 02/17/2023, 1:18 PM

## 2023-02-17 NOTE — Patient Instructions (Signed)
1) Strengthening: Chest Pull - Resisted   Hold Theraband in front of body with hands about shoulder width a part. Pull band a part and back together slowly. Repeat __10-15__ times. Complete ___1_ set(s) per session.. Repeat ___1_ session(s) per day.  http://orth.exer.us/926   Copyright  VHI. All rights reserved.   2) PNF Strengthening: Resisted   Standing with resistive band around each hand, bring right arm up and away, thumb back. Repeat _10-15___ times per set. Do __1__ sets per session. Do __1__ sessions per day.    3) Resisted External Rotation: in Neutral - Bilateral   Sit or stand, tubing in both hands, elbows at sides, bent to 90, forearms forward. Pinch shoulder blades together and rotate forearms out. Keep elbows at sides. Repeat _10-15___ times per set. Do __1__ sets per session. Do _1___ sessions per day.  http://orth.exer.us/966   Copyright  VHI. All rights reserved.   4) PNF Strengthening: Resisted   Standing, hold resistive band above head. Bring right arm down and out from side. Repeat _10-15___ times per set. Do __1__ sets per session. Do __1__ sessions per day.  http://orth.exer.us/922   Copyright  VHI. All rights reserved   1) Flexion Wall Stretch    Face wall, place affected handon wall in front of you. Slide hand up the wall  and lean body in towards the wall. Hold for 10 seconds. Repeat 3-5 times. 1-2 times/day.     2) Towel Stretch with Internal Rotation   Or     Gently pull up (or to the side) your affected arm  behind your back with the assist of a towel. Hold 10 seconds, repeat 3-5 times. 1-2 times/day.             3) Corner Stretch    Stand at a corner of a wall, place your arms on the walls with elbows bent. Lean into the corner until a stretch is felt along the front of your chest and/or shoulders. Hold for 10 seconds. Repeat 3-5X, 1-2 times/day.    4) Posterior Capsule Stretch    Bring the involved arm across  chest. Grasp elbow and pull toward chest until you feel a stretch in the back of the upper arm and shoulder. Hold 10 seconds. Repeat 3-5X. Complete 1-2 times/day.    5) Scapular Retraction    Tuck chin back as you pinch shoulder blades together.  Hold 5 seconds. Repeat 3-5X. Complete 1-2 times/day.    6) External Rotation Stretch:     Place your affected hand on the wall with the elbow bent and gently turn your body the opposite direction until a stretch is felt. Hold 10 seconds, repeat 3-5X. Complete 1-2 times/day.   OR    Standing in an open doorway, place your arm on the edge of the doorway with the shoulder at 90 degrees from the side and elbow bent to 90 degrees. Lean forward until you feel a stretch on the front of your shoulder. Keep neck relaxed. Hold 10 seconds, repeat 3-5X. Complete 1-2 times/day

## 2023-02-24 ENCOUNTER — Encounter (HOSPITAL_COMMUNITY): Payer: Medicare PPO | Admitting: Occupational Therapy

## 2023-02-25 ENCOUNTER — Encounter (HOSPITAL_COMMUNITY): Payer: Self-pay | Admitting: Occupational Therapy

## 2023-02-25 ENCOUNTER — Ambulatory Visit (HOSPITAL_COMMUNITY): Payer: Medicare PPO | Attending: Internal Medicine | Admitting: Occupational Therapy

## 2023-02-25 DIAGNOSIS — M25511 Pain in right shoulder: Secondary | ICD-10-CM | POA: Diagnosis not present

## 2023-02-25 DIAGNOSIS — M25611 Stiffness of right shoulder, not elsewhere classified: Secondary | ICD-10-CM

## 2023-02-25 DIAGNOSIS — R29898 Other symptoms and signs involving the musculoskeletal system: Secondary | ICD-10-CM

## 2023-02-25 NOTE — Patient Instructions (Signed)
 Complete _______ repetitions each. Complete _______ time a day.  Wall taps with theraband  With a looped elastic band around forearms/wrists and arms at a 90 angle pressed against the wall. Keeping elbows on the wall, tap right arm out to the right. Hold for 1 second. Return right arm back to neutral. Repeat with left arm.    Wall V slides with Theraband  Place a band loop around hands/forearms and face a wall. Extend both arms diagonally into a V shape on the wall. Hold this stretch for specified amount of time. Lower arms slowly back into neutral position. Repeat.       scap clocks  Tie a loop with a theraband and place around your wrists.  Stand with a wall in front of you.  Picture a clock in front of you.  Place both palms on the wall, arms straight.  While keeping the left/right hand planted, use the right/left hand to pull away and tap each number (1, 3, 5- right OR 11,9,7 left), coming back to center each time.

## 2023-02-25 NOTE — Therapy (Signed)
 OUTPATIENT OCCUPATIONAL THERAPY ORTHO TREATMENT NOTE  Patient Name: Austin Bright MRN: [id number] DOB:06-18-1945, 78 y.o., male Today's Date: 02/26/2023   END OF SESSION:  OT End of Session - 02/25/23 1147     Visit Number 3    Number of Visits 7    Date for OT Re-Evaluation 03/26/23    Authorization Type [employer]    Authorization Time Period 6 visits approved and 1 re-eval (02/09/23-03/26/23)    Authorization - Visit Number 2    Authorization - Number of Visits 6    OT Start Time 1107    OT Stop Time 1147    OT Time Calculation (min) 40 min    Activity Tolerance Patient tolerated treatment well    Behavior During Therapy [facility] for tasks assessed/performed             Past Medical History:  Diagnosis Date   Acute diverticulitis 08/17/2020   Acute MI (HCC) 08/2011   Arthritis    Bilateral pneumonia    Diagnosed after STEMI 08/2011   CAD (coronary artery disease)    a. Diagnosed 08/2011 with anterior STEMI due to thrombotic occlusion of mid LAD s/p thrombectomy, PTCA, DES placement 08/23/11.   CHF (congestive heart failure) (HCC)    Chronic combined systolic and diastolic congestive heart failure (HCC) 09/15/2011   Dyslipidemia    Gout    Hypertension    Ischemic cardiomyopathy    a. Initial EF 35% by cath 08/23/11, improved to 40-45% by echo 08/25/11. 50-55% by echo November 2013.   Shortness of breath    Past Surgical History:  Procedure Laterality Date   carpel tunnel Bilateral 2002   COLONOSCOPY WITH PROPOFOL  N/A 04/08/2017   Surgeon: [name], MD;  diverticulosis in sigmoid and descending colon, internal hemorrhoids, otherwise normal exam.  No recommendations to repeat due to age.   COLONOSCOPY WITH PROPOFOL  N/A 03/17/2021   Procedure: COLONOSCOPY WITH PROPOFOL ;  Surgeon: [name], DO;  [facility];  Service: Endoscopy;  Laterality: N/A;  8:00am   CORONARY STENT PLACEMENT     CYSTECTOMY  1969   pilonidal cyst   LEFT AND RIGHT HEART  CATHETERIZATION WITH CORONARY ANGIOGRAM N/A 09/15/2011   Procedure: LEFT AND RIGHT HEART CATHETERIZATION WITH CORONARY ANGIOGRAM;  Surgeon: [name], MD;  Location: [facility] CATH LAB;  Service: Cardiovascular;  Laterality: N/A;   LEFT HEART CATH N/A 08/23/2011   Procedure: LEFT HEART CATH;  Surgeon: [name], MD;  Location: [facility] CATH LAB;  Service: Cardiovascular;  Laterality: N/A;   NO PAST SURGERIES     PERCUTANEOUS CORONARY STENT INTERVENTION (PCI-S)  08/23/2011   Procedure: PERCUTANEOUS CORONARY STENT INTERVENTION (PCI-S);  Surgeon: [name], MD;  Location: [facility] CATH LAB;  Service: Cardiovascular;;   Patient Active Problem List   Diagnosis Date Noted   Tendinopathy of right rotator cuff 02/03/2023   Mixed hyperlipidemia 01/23/2022   Encounter for general adult medical examination with abnormal findings 07/18/2021   Prediabetes 07/18/2021   Vitamin D  deficiency 07/18/2021   History of diverticulitis 02/24/2021   Pancreatic lesion 02/24/2021   Prostate cancer screening 01/30/2021   Primary insomnia 01/27/2021   Hepatomegaly    Essential hypertension 08/18/2020   Idiopathic gout 11/16/2018   Old anteroseptal myocardial infarction 08/24/2011   Coronary artery disease involving native coronary artery of native heart without angina pectoris 08/24/2011    PCP: Tobie Downs, MD REFERRING PROVIDER: Tobie Downs, MD  ONSET DATE: 09/01/22  REFERRING DIAG:  R shoulder Pain/ Bicep Tendonitis  THERAPY DIAG:  Right shoulder pain, unspecified chronicity  Shoulder stiffness, right  Other symptoms and signs involving the musculoskeletal system  Rationale for Evaluation and Treatment: Rehabilitation  SUBJECTIVE:   SUBJECTIVE STATEMENT: I don't feel like I've improved like I thought I would Pt accompanied by: self  PERTINENT HISTORY: Pt had a fall in August 2024, resulting in pain in the deltoid, limited ROM, and weakness. PMH significant for CAD s/p stent placement,  HTN, HLD, gout and diverticulitis.  PRECAUTIONS: None  WEIGHT BEARING RESTRICTIONS: No  PAIN:  Are you having pain? No and intermittent pain up to 5/10 with most movements  FALLS: Has patient fallen in last 6 months? Yes. Number of falls 1  PLOF: Independent  PATIENT GOALS: To eliminate the pain  NEXT MD VISIT: None  OBJECTIVE:   HAND DOMINANCE: Left  ADLs: Overall ADLs: Pt having severe pain with all ADL's, as well as lifting and carrying items. Pt unable to reach behind his back for hygiene and pulling up the back of his pants.   FUNCTIONAL OUTCOME MEASURES: FOTO: 60.86  UPPER EXTREMITY ROM:       Assessed in seated, er/IR adducted  Active ROM Right eval  Shoulder flexion 146  Shoulder abduction 154  Shoulder internal rotation 90  Shoulder external rotation 55  (Blank rows = not tested)    UPPER EXTREMITY MMT:     Assessed in seated, er/IR adducted  MMT Right eval  Shoulder flexion 4-/5  Shoulder abduction 4/5  Shoulder internal rotation 4+/5  Shoulder external rotation 4/5  (Blank rows = not tested)  SENSATION: WFL  EDEMA: No swelling noted  OBSERVATIONS: Moderate to severe fascial restrictions along the deltoid, bicep tendon, and subscapular region.   TODAY'S TREATMENT:                                                                                                                              DATE:  02/25/23 -Manual Therapy: myofascial release and trigger point applied to biceps, deltoid, scapular region, and trapezius, in order to reduce pain and fascial restrictions, as well as improve ROM. -A/ROM: seated, flexion, abduction, protraction, horizontal abduction, er/IR, x10 -ABC's in the air, arm at 90 degrees flexion -PNF Strengthening: chest pulls, overhead pulls, er pulls, PNF up, PNF down, x10 -Loop Band Exercises: green band, wall taps, V ups, Wall Clocks, x10 -K-taping: 1 strip applied from mid deltoid up to glenohumeral notch, 2nd strip  cut in half and placed on distal deltoid tendons and wrapped around the center tape up to the glenohumeral notch. -UBE: level 1, 2.5' forwards and backwards  02/17/23:  -Manual Therapy: myofascial release and trigger point applied to biceps, deltoid, scapular region, and trapezius, in order to reduce pain and fascial restrictions, as well as improve ROM. -A/ROM: seated, flexion, abduction, protraction, horizontal abduction, er/IR, x10 -Proximal Shoulder Exercises: paddles, criss cross, circles both directions, x10 each -PNF Strengthening: chest pulls,  overhead pulls, er pulls, PNF up, PNF down, x10 -Stretches: cross chest pull, behind the back pull, flexion on the wall, er in the corner, 5x10   PATIENT EDUCATION: Education details: Loop Band Exercises Person educated: Patient Education method: Explanation, Demonstration, and Handouts Education comprehension: verbalized understanding and returned demonstration  HOME EXERCISE PROGRAM: 1/21: A/ROM 1/29: Stretches and PNF strengthening   GOALS: Goals reviewed with patient? Yes   SHORT TERM GOALS: Target date: 03/19/23  Pt will be provided with and educated on HEP to improve mobility in RUE required for use during ADL completion.   Goal status: IN PROGRESS  Pt will decrease pain in RUE to 3/10 or less to improve ability to sleep for 2+ consecutive hours without waking due to pain.   Goal status: IN PROGRESS  2.  Pt will decrease RUE fascial restrictions to min amounts or less to improve mobility required for functional reaching tasks.   Goal status: IN PROGRESS  3.  Pt will increase RUE A/ROM by 15 degrees to improve ability to use RUE when reaching overhead or behind back during dressing and bathing tasks.   Goal status: IN PROGRESS  4.  Pt will increase RUE strength to 4+/5 or greater to improve ability to use RUE when lifting or carrying items during meal preparation/housework/yardwork tasks.   Goal status: IN  PROGRESS  5.  Pt will return to highest level of function using RUE as non-dominant during functional task completion.   Goal status: IN PROGRESS   ASSESSMENT:  CLINICAL IMPRESSION: Pain continues to be a limiting factor for patient with overall mobility. He continues to feel that all pain is muscle related along the deltoid, rather than in the joint of his shoulder. OT applied k-tape along the deltoid this session to address muscle tension and pain, as well as promoting muscle support during all functional movements. Additionally, OT added loop band exercises this session to improve his strengthening, as well as continuing to address deltoid tightness and pain. Verbal and tactile cuing provided for positioning and technique throughout session.   PERFORMANCE DEFICITS: in functional skills including in functional skills including ADLs, IADLs, coordination, tone, ROM, strength, pain, fascial restrictions, muscle spasms, and UE functional use.SABRA    PLAN:  OT FREQUENCY: 1x/week  OT DURATION: 6 weeks  PLANNED INTERVENTIONS: 97168 OT Re-evaluation, 97535 self care/ADL training, 02889 therapeutic exercise, 97530 therapeutic activity, 97140 manual therapy, 97035 ultrasound, 97010 moist heat, 97032 electrical stimulation (manual), passive range of motion, functional mobility training, energy conservation, coping strategies training, patient/family education, and DME and/or AE instructions  RECOMMENDED OTHER SERVICES: N/A  CONSULTED AND AGREED WITH PLAN OF CARE: Patient  PLAN FOR NEXT SESSION: Manual Therapy, A/ROM, Proximal Shoulder exercises, Isometrics, possible K-tape trial   Valentin Nightingale, OTR/L [facility] [phone] Terina Mcelhinny Jillyn Nightingale, OT 02/26/2023, 9:15 AM

## 2023-03-03 ENCOUNTER — Ambulatory Visit (HOSPITAL_COMMUNITY): Payer: Medicare PPO | Admitting: Occupational Therapy

## 2023-03-03 ENCOUNTER — Encounter (HOSPITAL_COMMUNITY): Payer: Self-pay | Admitting: Occupational Therapy

## 2023-03-03 DIAGNOSIS — M25511 Pain in right shoulder: Secondary | ICD-10-CM | POA: Diagnosis not present

## 2023-03-03 DIAGNOSIS — M25611 Stiffness of right shoulder, not elsewhere classified: Secondary | ICD-10-CM | POA: Diagnosis not present

## 2023-03-03 DIAGNOSIS — R29898 Other symptoms and signs involving the musculoskeletal system: Secondary | ICD-10-CM | POA: Diagnosis not present

## 2023-03-03 NOTE — Therapy (Signed)
OUTPATIENT OCCUPATIONAL THERAPY ORTHO TREATMENT NOTE  Patient Name: Austin Bright MRN: [id number] DOB:December 12, 1945, 78 y.o., male Today's Date: 03/03/2023   END OF SESSION:  OT End of Session - 03/03/23 1031     Visit Number 4    Number of Visits 7    Date for OT Re-Evaluation 03/26/23    Authorization Type [employer]    Authorization Time Period 6 visits approved and 1 re-eval (02/09/23-03/26/23)    Authorization - Visit Number 3    Authorization - Number of Visits 6    OT Start Time [phone]    OT Stop Time 1031    OT Time Calculation (min) 45 min    Activity Tolerance Patient tolerated treatment well    Behavior During Therapy [facility] for tasks assessed/performed             Past Medical History:  Diagnosis Date   Acute diverticulitis 08/17/2020   Acute MI (HCC) 08/2011   Arthritis    Bilateral pneumonia    Diagnosed after STEMI 08/2011   CAD (coronary artery disease)    a. Diagnosed 08/2011 with anterior STEMI due to thrombotic occlusion of mid LAD s/p thrombectomy, PTCA, DES placement 08/23/11.   CHF (congestive heart failure) (HCC)    Chronic combined systolic and diastolic congestive heart failure (HCC) 09/15/2011   Dyslipidemia    Gout    Hypertension    Ischemic cardiomyopathy    a. Initial EF 35% by cath 08/23/11, improved to 40-45% by echo 08/25/11. 50-55% by echo November 2013.   Shortness of breath    Past Surgical History:  Procedure Laterality Date   carpel tunnel Bilateral 2002   COLONOSCOPY WITH PROPOFOL N/A 04/08/2017   Surgeon: Corbin Ade, MD;  diverticulosis in sigmoid and descending colon, internal hemorrhoids, otherwise normal exam.  No recommendations to repeat due to age.   COLONOSCOPY WITH PROPOFOL N/A 03/17/2021   Procedure: COLONOSCOPY WITH PROPOFOL;  Surgeon: Lanelle Bal, DO;  [facility];  Service: Endoscopy;  Laterality: N/A;  8:00am   CORONARY STENT PLACEMENT     CYSTECTOMY  1969   pilonidal cyst   LEFT AND RIGHT HEART  CATHETERIZATION WITH CORONARY ANGIOGRAM N/A 09/15/2011   Procedure: LEFT AND RIGHT HEART CATHETERIZATION WITH CORONARY ANGIOGRAM;  Surgeon: Herby Abraham, MD;  Location: [facility] CATH LAB;  Service: Cardiovascular;  Laterality: N/A;   LEFT HEART CATH N/A 08/23/2011   Procedure: LEFT HEART CATH;  Surgeon: Iran Ouch, MD;  Location: [facility] CATH LAB;  Service: Cardiovascular;  Laterality: N/A;   NO PAST SURGERIES     PERCUTANEOUS CORONARY STENT INTERVENTION (PCI-S)  08/23/2011   Procedure: PERCUTANEOUS CORONARY STENT INTERVENTION (PCI-S);  Surgeon: Iran Ouch, MD;  Location: [facility] CATH LAB;  Service: Cardiovascular;;   Patient Active Problem List   Diagnosis Date Noted   Tendinopathy of right rotator cuff 02/03/2023   Mixed hyperlipidemia 01/23/2022   Encounter for general adult medical examination with abnormal findings 07/18/2021   Prediabetes 07/18/2021   Vitamin D deficiency 07/18/2021   History of diverticulitis 02/24/2021   Pancreatic lesion 02/24/2021   Prostate cancer screening 01/30/2021   Primary insomnia 01/27/2021   Hepatomegaly    Essential hypertension 08/18/2020   Idiopathic gout 11/16/2018   Old anteroseptal myocardial infarction 08/24/2011   Coronary artery disease involving native coronary artery of native heart without angina pectoris 08/24/2011    PCP: Trena Platt, MD REFERRING PROVIDER: Trena Platt, MD  ONSET DATE: 09/01/22  REFERRING DIAG:  R shoulder Pain/ Bicep Tendonitis  THERAPY DIAG:  Right shoulder pain, unspecified chronicity  Shoulder stiffness, right  Other symptoms and signs involving the musculoskeletal system  Rationale for Evaluation and Treatment: Rehabilitation  SUBJECTIVE:   SUBJECTIVE STATEMENT: "I think it may be less sore and knotted" Pt accompanied by: self  PERTINENT HISTORY: Pt had a fall in August 2024, resulting in pain in the deltoid, limited ROM, and weakness. PMH significant for CAD s/p stent placement, HTN, HLD, gout  and diverticulitis.  PRECAUTIONS: None  WEIGHT BEARING RESTRICTIONS: No  PAIN:  Are you having pain? No and intermittent pain up to 5/10 with most movements  FALLS: Has patient fallen in last 6 months? Yes. Number of falls 1  PLOF: Independent  PATIENT GOALS: To eliminate the pain  NEXT MD VISIT: None  OBJECTIVE:   HAND DOMINANCE: Left  ADLs: Overall ADLs: Pt having severe pain with all ADL's, as well as lifting and carrying items. Pt unable to reach behind his back for hygiene and pulling up the back of his pants.   FUNCTIONAL OUTCOME MEASURES: FOTO: 60.86  UPPER EXTREMITY ROM:       Assessed in seated, er/IR adducted  Active ROM Right eval  Shoulder flexion 146  Shoulder abduction 154  Shoulder internal rotation 90  Shoulder external rotation 55  (Blank rows = not tested)    UPPER EXTREMITY MMT:     Assessed in seated, er/IR adducted  MMT Right eval  Shoulder flexion 4-/5  Shoulder abduction 4/5  Shoulder internal rotation 4+/5  Shoulder external rotation 4/5  (Blank rows = not tested)  SENSATION: WFL  EDEMA: No swelling noted  OBSERVATIONS: Moderate to severe fascial restrictions along the deltoid, bicep tendon, and subscapular region.   TODAY'S TREATMENT:                                                                                                                              DATE:  03/03/23 -Manual Therapy: myofascial release and trigger point applied to biceps, deltoid, scapular region, and trapezius, in order to reduce pain and fascial restrictions, as well as improve ROM. -A/ROM: seated, flexion, abduction, protraction, horizontal abduction, er/IR, x12 -X to V arms, x12 -Goal Post, x12 -Strengthening: 3#, bicep curls, hammer curls, protraction, x12 -Stretching: bicep on the wall, 4x15" -K-taping: 1 strip applied from mid deltoid up to glenohumeral notch, 2nd strip cut in half and placed on distal deltoid tendons and wrapped around the  center tape up to the glenohumeral notch.  02/25/23 -Manual Therapy: myofascial release and trigger point applied to biceps, deltoid, scapular region, and trapezius, in order to reduce pain and fascial restrictions, as well as improve ROM. -A/ROM: seated, flexion, abduction, protraction, horizontal abduction, er/IR, x10 -ABC's in the air, arm at 90 degrees flexion -PNF Strengthening: chest pulls, overhead pulls, er pulls, PNF up, PNF down, x10 -Loop Band Exercises: green band, wall taps, V ups, Wall Clocks, x10 -K-taping: 1 strip  applied from mid deltoid up to glenohumeral notch, 2nd strip cut in half and placed on distal deltoid tendons and wrapped around the center tape up to the glenohumeral notch. -UBE: level 1, 2.5' forwards and backwards  02/17/23:  -Manual Therapy: myofascial release and trigger point applied to biceps, deltoid, scapular region, and trapezius, in order to reduce pain and fascial restrictions, as well as improve ROM. -A/ROM: seated, flexion, abduction, protraction, horizontal abduction, er/IR, x10 -Proximal Shoulder Exercises: paddles, criss cross, circles both directions, x10 each -PNF Strengthening: chest pulls, overhead pulls, er pulls, PNF up, PNF down, x10 -Stretches: cross chest pull, behind the back pull, flexion on the wall, er in the corner, 5x10"   PATIENT EDUCATION: Education details: bicep strengthening Person educated: Patient Education method: Explanation, Demonstration, and Handouts Education comprehension: verbalized understanding and returned demonstration  HOME EXERCISE PROGRAM: 1/21: A/ROM 1/29: Stretches and PNF strengthening 2/6: Loop Band Exercises 2/12: Bicep strengthening   GOALS: Goals reviewed with patient? Yes   SHORT TERM GOALS: Target date: 03/19/23  Pt will be provided with and educated on HEP to improve mobility in RUE required for use during ADL completion.   Goal status: IN PROGRESS  Pt will decrease pain in RUE to 3/10 or  less to improve ability to sleep for 2+ consecutive hours without waking due to pain.   Goal status: IN PROGRESS  2.  Pt will decrease RUE fascial restrictions to min amounts or less to improve mobility required for functional reaching tasks.   Goal status: IN PROGRESS  3.  Pt will increase RUE A/ROM by 15 degrees to improve ability to use RUE when reaching overhead or behind back during dressing and bathing tasks.   Goal status: IN PROGRESS  4.  Pt will increase RUE strength to 4+/5 or greater to improve ability to use RUE when lifting or carrying items during meal preparation/housework/yardwork tasks.   Goal status: IN PROGRESS  5.  Pt will return to highest level of function using RUE as non-dominant during functional task completion.   Goal status: IN PROGRESS   ASSESSMENT:  CLINICAL IMPRESSION: This session, pt presenting with decreased fascial restrictions and pain. He was able to tolerate all ROM with good movement pattern and no pain. OT was able to add bicep strengthening this session with emphasis on the extension at a slow and controlled rate. Pt also requesting k-tape be reapplied to continue improving his pain and fascial restrictions. Verbal and tactile cuing provided for positioning and technique throughout session.   PERFORMANCE DEFICITS: in functional skills including in functional skills including ADLs, IADLs, coordination, tone, ROM, strength, pain, fascial restrictions, muscle spasms, and UE functional use.Marland Kitchen    PLAN:  OT FREQUENCY: 1x/week  OT DURATION: 6 weeks  PLANNED INTERVENTIONS: 97168 OT Re-evaluation, 97535 self care/ADL training, 16109 therapeutic exercise, 97530 therapeutic activity, 97140 manual therapy, 97035 ultrasound, 97010 moist heat, 97032 electrical stimulation (manual), passive range of motion, functional mobility training, energy conservation, coping strategies training, patient/family education, and DME and/or AE instructions  RECOMMENDED  OTHER SERVICES: N/A  CONSULTED AND AGREED WITH PLAN OF CARE: Patient  PLAN FOR NEXT SESSION: Manual Therapy, A/ROM, Proximal Shoulder exercises, Isometrics, K-taping   Trish Mage, OTR/L [facility] [phone] Kennyth Arnold, OT 03/03/2023, 11:23 AM

## 2023-03-10 ENCOUNTER — Encounter (HOSPITAL_COMMUNITY): Payer: Medicare PPO | Admitting: Occupational Therapy

## 2023-03-17 ENCOUNTER — Encounter (HOSPITAL_COMMUNITY): Payer: Medicare PPO | Admitting: Occupational Therapy

## 2023-03-19 ENCOUNTER — Ambulatory Visit (HOSPITAL_COMMUNITY): Payer: Medicare PPO | Admitting: Occupational Therapy

## 2023-03-19 ENCOUNTER — Encounter (HOSPITAL_COMMUNITY): Payer: Self-pay | Admitting: Occupational Therapy

## 2023-03-19 DIAGNOSIS — M25611 Stiffness of right shoulder, not elsewhere classified: Secondary | ICD-10-CM | POA: Diagnosis not present

## 2023-03-19 DIAGNOSIS — M25511 Pain in right shoulder: Secondary | ICD-10-CM | POA: Diagnosis not present

## 2023-03-19 DIAGNOSIS — R29898 Other symptoms and signs involving the musculoskeletal system: Secondary | ICD-10-CM | POA: Diagnosis not present

## 2023-03-19 NOTE — Therapy (Signed)
 OUTPATIENT OCCUPATIONAL THERAPY ORTHO TREATMENT NOTE  Patient Name: Austin Bright MRN: [id number] DOB:11-19-1945, 78 y.o., male Today's Date: 03/19/2023   END OF SESSION:  OT End of Session - 03/19/23 1143     Visit Number 5    Number of Visits 7    Date for OT Re-Evaluation 03/26/23    Authorization Type [employer]    Authorization Time Period 6 visits approved and 1 re-eval (02/09/23-03/26/23)    Authorization - Visit Number 4    Authorization - Number of Visits 6    OT Start Time 1104    OT Stop Time 1143    OT Time Calculation (min) 39 min    Activity Tolerance Patient tolerated treatment well    Behavior During Therapy [facility] for tasks assessed/performed              Past Medical History:  Diagnosis Date   Acute diverticulitis 08/17/2020   Acute MI (HCC) 08/2011   Arthritis    Bilateral pneumonia    Diagnosed after STEMI 08/2011   CAD (coronary artery disease)    a. Diagnosed 08/2011 with anterior STEMI due to thrombotic occlusion of mid LAD s/p thrombectomy, PTCA, DES placement 08/23/11.   CHF (congestive heart failure) (HCC)    Chronic combined systolic and diastolic congestive heart failure (HCC) 09/15/2011   Dyslipidemia    Gout    Hypertension    Ischemic cardiomyopathy    a. Initial EF 35% by cath 08/23/11, improved to 40-45% by echo 08/25/11. 50-55% by echo November 2013.   Shortness of breath    Past Surgical History:  Procedure Laterality Date   carpel tunnel Bilateral 2002   COLONOSCOPY WITH PROPOFOL N/A 04/08/2017   Surgeon: Corbin Ade, MD;  diverticulosis in sigmoid and descending colon, internal hemorrhoids, otherwise normal exam.  No recommendations to repeat due to age.   COLONOSCOPY WITH PROPOFOL N/A 03/17/2021   Procedure: COLONOSCOPY WITH PROPOFOL;  Surgeon: Lanelle Bal, DO;  [facility]: [facility];  Service: Endoscopy;  Laterality: N/A;  8:00am   CORONARY STENT PLACEMENT     CYSTECTOMY  1969   pilonidal cyst   LEFT AND RIGHT HEART  CATHETERIZATION WITH CORONARY ANGIOGRAM N/A 09/15/2011   Procedure: LEFT AND RIGHT HEART CATHETERIZATION WITH CORONARY ANGIOGRAM;  Surgeon: Herby Abraham, MD;  Location: [facility] CATH LAB;  Service: Cardiovascular;  Laterality: N/A;   LEFT HEART CATH N/A 08/23/2011   Procedure: LEFT HEART CATH;  Surgeon: Iran Ouch, MD;  Location: [facility] CATH LAB;  Service: Cardiovascular;  Laterality: N/A;   NO PAST SURGERIES     PERCUTANEOUS CORONARY STENT INTERVENTION (PCI-S)  08/23/2011   Procedure: PERCUTANEOUS CORONARY STENT INTERVENTION (PCI-S);  Surgeon: Iran Ouch, MD;  Location: [facility] CATH LAB;  Service: Cardiovascular;;   Patient Active Problem List   Diagnosis Date Noted   Tendinopathy of right rotator cuff 02/03/2023   Mixed hyperlipidemia 01/23/2022   Encounter for general adult medical examination with abnormal findings 07/18/2021   Prediabetes 07/18/2021   Vitamin D deficiency 07/18/2021   History of diverticulitis 02/24/2021   Pancreatic lesion 02/24/2021   Prostate cancer screening 01/30/2021   Primary insomnia 01/27/2021   Hepatomegaly    Essential hypertension 08/18/2020   Idiopathic gout 11/16/2018   Old anteroseptal myocardial infarction 08/24/2011   Coronary artery disease involving native coronary artery of native heart without angina pectoris 08/24/2011    PCP: Trena Platt, MD REFERRING PROVIDER: Trena Platt, MD  ONSET DATE: 09/01/22  REFERRING  DIAG: R shoulder Pain/ Bicep Tendonitis  THERAPY DIAG:  Right shoulder pain, unspecified chronicity  Shoulder stiffness, right  Rationale for Evaluation and Treatment: Rehabilitation  SUBJECTIVE:   SUBJECTIVE STATEMENT: "It hurts really bad between 7-7:30PM" Pt accompanied by: self  PERTINENT HISTORY: Pt had a fall in August 2024, resulting in pain in the deltoid, limited ROM, and weakness. PMH significant for CAD s/p stent placement, HTN, HLD, gout and diverticulitis.  PRECAUTIONS: None  WEIGHT BEARING  RESTRICTIONS: No  PAIN:  Are you having pain? No and intermittent pain up to 5/10 with most movements  FALLS: Has patient fallen in last 6 months? Yes. Number of falls 1  PLOF: Independent  PATIENT GOALS: To eliminate the pain  NEXT MD VISIT: None  OBJECTIVE:   HAND DOMINANCE: Left  ADLs: Overall ADLs: Pt having severe pain with all ADL's, as well as lifting and carrying items. Pt unable to reach behind his back for hygiene and pulling up the back of his pants.   FUNCTIONAL OUTCOME MEASURES: FOTO: 60.86  UPPER EXTREMITY ROM:       Assessed in seated, er/IR adducted  Active ROM Right eval  Shoulder flexion 146  Shoulder abduction 154  Shoulder internal rotation 90  Shoulder external rotation 55  (Blank rows = not tested)    UPPER EXTREMITY MMT:     Assessed in seated, er/IR adducted  MMT Right eval  Shoulder flexion 4-/5  Shoulder abduction 4/5  Shoulder internal rotation 4+/5  Shoulder external rotation 4/5  (Blank rows = not tested)  SENSATION: WFL  EDEMA: No swelling noted  OBSERVATIONS: Moderate to severe fascial restrictions along the deltoid, bicep tendon, and subscapular region.   TODAY'S TREATMENT:                                                                                                                              DATE:  03/19/23 -Manual Therapy: myofascial release and trigger point applied to biceps, deltoid, scapular region, and trapezius, in order to reduce pain and fascial restrictions, as well as improve ROM. -A/ROM: seated, flexion, abduction, protraction, horizontal abduction, er/IR, x12 -Shoulder strength: flexion, abduction, curls, protraction x10 3 pounds (2 pounds abduction)  -UBE: level 1, 2.5' forwards and backwards     03/03/23 -Manual Therapy: myofascial release and trigger point applied to biceps, deltoid, scapular region, and trapezius, in order to reduce pain and fascial restrictions, as well as improve ROM. -A/ROM:  seated, flexion, abduction, protraction, horizontal abduction, er/IR, x12 -X to V arms, x12 -Goal Post, x12 -Strengthening: 3#, bicep curls, hammer curls, protraction, x12 -Stretching: bicep on the wall, 4x15" -K-taping: 1 strip applied from mid deltoid up to glenohumeral notch, 2nd strip cut in half and placed on distal deltoid tendons and wrapped around the center tape up to the glenohumeral notch.  02/25/23 -Manual Therapy: myofascial release and trigger point applied to biceps, deltoid, scapular region, and trapezius, in order to reduce pain and fascial restrictions,  as well as improve ROM. -A/ROM: seated, flexion, abduction, protraction, horizontal abduction, er/IR, x10 -ABC's in the air, arm at 90 degrees flexion -PNF Strengthening: chest pulls, overhead pulls, er pulls, PNF up, PNF down, x10 -Loop Band Exercises: green band, wall taps, V ups, Wall Clocks, x10 -K-taping: 1 strip applied from mid deltoid up to glenohumeral notch, 2nd strip cut in half and placed on distal deltoid tendons and wrapped around the center tape up to the glenohumeral notch. -UBE: level 1, 2.5' forwards and backwards   PATIENT EDUCATION: Education details: bicep strengthening Person educated: Patient Education method: Explanation, Demonstration, and Handouts Education comprehension: verbalized understanding and returned demonstration  HOME EXERCISE PROGRAM: 1/21: A/ROM 1/29: Stretches and PNF strengthening 2/6: Loop Band Exercises 2/12: Bicep strengthening   GOALS: Goals reviewed with patient? Yes   SHORT TERM GOALS: Target date: 03/19/23  Pt will be provided with and educated on HEP to improve mobility in RUE required for use during ADL completion.   Goal status: IN PROGRESS  Pt will decrease pain in RUE to 3/10 or less to improve ability to sleep for 2+ consecutive hours without waking due to pain.   Goal status: IN PROGRESS  2.  Pt will decrease RUE fascial restrictions to min amounts or  less to improve mobility required for functional reaching tasks.   Goal status: IN PROGRESS  3.  Pt will increase RUE A/ROM by 15 degrees to improve ability to use RUE when reaching overhead or behind back during dressing and bathing tasks.   Goal status: IN PROGRESS  4.  Pt will increase RUE strength to 4+/5 or greater to improve ability to use RUE when lifting or carrying items during meal preparation/housework/yardwork tasks.   Goal status: IN PROGRESS  5.  Pt will return to highest level of function using RUE as non-dominant during functional task completion.   Goal status: IN PROGRESS   ASSESSMENT:  CLINICAL IMPRESSION: Session focused on manual therapy due to increased fascial restrictions in bicep region. Pt reports decreased tightness after manual this session. Continued shoulder strengthening, pt reports pain with weighted abduction movement in deltoid area. VC for technique as needed.   PERFORMANCE DEFICITS: in functional skills including in functional skills including ADLs, IADLs, coordination, tone, ROM, strength, pain, fascial restrictions, muscle spasms, and UE functional use.Marland Kitchen    PLAN:  OT FREQUENCY: 1x/week  OT DURATION: 6 weeks  PLANNED INTERVENTIONS: 97168 OT Re-evaluation, 97535 self care/ADL training, 16109 therapeutic exercise, 97530 therapeutic activity, 97140 manual therapy, 97035 ultrasound, 97010 moist heat, 97032 electrical stimulation (manual), passive range of motion, functional mobility training, energy conservation, coping strategies training, patient/family education, and DME and/or AE instructions  RECOMMENDED OTHER SERVICES: N/A  CONSULTED AND AGREED WITH PLAN OF CARE: Patient  PLAN FOR NEXT SESSION: Manual Therapy, A/ROM, Proximal Shoulder exercises, Isometrics, K-taping   [facility] [phone] Bevelyn Ngo, OTR/L 03/19/2023, 11:44 AM

## 2023-03-24 ENCOUNTER — Ambulatory Visit (HOSPITAL_COMMUNITY): Payer: Medicare PPO | Attending: Internal Medicine | Admitting: Occupational Therapy

## 2023-03-24 ENCOUNTER — Encounter (HOSPITAL_COMMUNITY): Payer: Self-pay | Admitting: Occupational Therapy

## 2023-03-24 DIAGNOSIS — R29898 Other symptoms and signs involving the musculoskeletal system: Secondary | ICD-10-CM | POA: Diagnosis not present

## 2023-03-24 DIAGNOSIS — M25611 Stiffness of right shoulder, not elsewhere classified: Secondary | ICD-10-CM | POA: Diagnosis not present

## 2023-03-24 DIAGNOSIS — M25511 Pain in right shoulder: Secondary | ICD-10-CM | POA: Insufficient documentation

## 2023-03-24 NOTE — Therapy (Signed)
 OUTPATIENT OCCUPATIONAL THERAPY ORTHO TREATMENT NOTE REASSESSMENT/RECERTIFICATION  Patient Name: Austin Bright MRN: [id number] DOB:07-May-1945, 78 y.o., male Today's Date: 03/25/2023   END OF SESSION:  OT End of Session - 03/24/23 1027     Visit Number 6    Number of Visits 10    Date for OT Re-Evaluation 04/30/23    Authorization Type [employer]    Authorization Time Period 6 visits approved and 1 re-eval (02/09/23-03/26/23), requesting 4 more visits    Authorization - Visit Number 5    Authorization - Number of Visits 6    OT Start Time [phone]    OT Stop Time 1027    OT Time Calculation (min) 41 min    Activity Tolerance Patient tolerated treatment well    Behavior During Therapy [facility] for tasks assessed/performed             Past Medical History:  Diagnosis Date   Acute diverticulitis 08/17/2020   Acute MI (HCC) 08/2011   Arthritis    Bilateral pneumonia    Diagnosed after STEMI 08/2011   CAD (coronary artery disease)    a. Diagnosed 08/2011 with anterior STEMI due to thrombotic occlusion of mid LAD s/p thrombectomy, PTCA, DES placement 08/23/11.   CHF (congestive heart failure) (HCC)    Chronic combined systolic and diastolic congestive heart failure (HCC) 09/15/2011   Dyslipidemia    Gout    Hypertension    Ischemic cardiomyopathy    a. Initial EF 35% by cath 08/23/11, improved to 40-45% by echo 08/25/11. 50-55% by echo November 2013.   Shortness of breath    Past Surgical History:  Procedure Laterality Date   carpel tunnel Bilateral 2002   COLONOSCOPY WITH PROPOFOL N/A 04/08/2017   Surgeon: Corbin Ade, MD;  diverticulosis in sigmoid and descending colon, internal hemorrhoids, otherwise normal exam.  No recommendations to repeat due to age.   COLONOSCOPY WITH PROPOFOL N/A 03/17/2021   Procedure: COLONOSCOPY WITH PROPOFOL;  Surgeon: Lanelle Bal, DO;  Location: [facility];  Service: Endoscopy;  Laterality: N/A;  8:00am   CORONARY STENT PLACEMENT     CYSTECTOMY   1969   pilonidal cyst   LEFT AND RIGHT HEART CATHETERIZATION WITH CORONARY ANGIOGRAM N/A 09/15/2011   Procedure: LEFT AND RIGHT HEART CATHETERIZATION WITH CORONARY ANGIOGRAM;  Surgeon: Herby Abraham, MD;  Location: [facility] CATH LAB;  Service: Cardiovascular;  Laterality: N/A;   LEFT HEART CATH N/A 08/23/2011   Procedure: LEFT HEART CATH;  Surgeon: Iran Ouch, MD;  Location: [facility] CATH LAB;  Service: Cardiovascular;  Laterality: N/A;   NO PAST SURGERIES     PERCUTANEOUS CORONARY STENT INTERVENTION (PCI-S)  08/23/2011   Procedure: PERCUTANEOUS CORONARY STENT INTERVENTION (PCI-S);  Surgeon: Iran Ouch, MD;  Location: [facility] CATH LAB;  Service: Cardiovascular;;   Patient Active Problem List   Diagnosis Date Noted   Tendinopathy of right rotator cuff 02/03/2023   Mixed hyperlipidemia 01/23/2022   Encounter for general adult medical examination with abnormal findings 07/18/2021   Prediabetes 07/18/2021   Vitamin D deficiency 07/18/2021   History of diverticulitis 02/24/2021   Pancreatic lesion 02/24/2021   Prostate cancer screening 01/30/2021   Primary insomnia 01/27/2021   Hepatomegaly    Essential hypertension 08/18/2020   Idiopathic gout 11/16/2018   Old anteroseptal myocardial infarction 08/24/2011   Coronary artery disease involving native coronary artery of native heart without angina pectoris 08/24/2011    PCP: Trena Platt, MD REFERRING PROVIDER: Trena Platt, MD  ONSET  DATE: 09/01/22  REFERRING DIAG: R shoulder Pain/ Bicep Tendonitis  THERAPY DIAG:  Right shoulder pain, unspecified chronicity - Plan: Ot plan of care cert/re-cert  Shoulder stiffness, right - Plan: Ot plan of care cert/re-cert  Other symptoms and signs involving the musculoskeletal system - Plan: Ot plan of care cert/re-cert  Rationale for Evaluation and Treatment: Rehabilitation  SUBJECTIVE:   SUBJECTIVE STATEMENT: "I'm feeling somewhat better" Pt accompanied by: self  PERTINENT HISTORY: Pt  had a fall in August 2024, resulting in pain in the deltoid, limited ROM, and weakness. PMH significant for CAD s/p stent placement, HTN, HLD, gout and diverticulitis.  PRECAUTIONS: None  WEIGHT BEARING RESTRICTIONS: No  PAIN:  Are you having pain? No and intermittent pain up to 5/10 with most movements  FALLS: Has patient fallen in last 6 months? Yes. Number of falls 1  PLOF: Independent  PATIENT GOALS: To eliminate the pain  NEXT MD VISIT: None  OBJECTIVE:   HAND DOMINANCE: Left  ADLs: Overall ADLs: Pt having severe pain with all ADL's, as well as lifting and carrying items. Pt unable to reach behind his back for hygiene and pulling up the back of his pants.   FUNCTIONAL OUTCOME MEASURES: FOTO: 60.86 03/24/23: 59.02  UPPER EXTREMITY ROM:       Assessed in seated, er/IR adducted  Active ROM Right eval Right 03/24/23  Shoulder flexion 146 163  Shoulder abduction 154 169  Shoulder internal rotation 90 90  Shoulder external rotation 55 73  (Blank rows = not tested)    UPPER EXTREMITY MMT:     Assessed in seated, er/IR adducted  MMT Right eval Right 03/24/23  Shoulder flexion 4-/5 4+/5  Shoulder abduction 4/5 5/5  Shoulder internal rotation 4+/5 5/5  Shoulder external rotation 4/5 4+/5  (Blank rows = not tested)  SENSATION: WFL  EDEMA: No swelling noted  OBSERVATIONS: Moderate to severe fascial restrictions along the deltoid, bicep tendon, and subscapular region.   TODAY'S TREATMENT:                                                                                                                              DATE:  03/24/23 -A/ROM: seated, flexion, abduction, protraction, horizontal abduction, er/IR, x12 -X to V arms, x12 -Goal Post, x12 -Proximal Shoulder Strengthening: paddles, criss cross, circles both directions, x10 each -Measurements for reassessment -Manual Therapy: myofascial release and trigger point, utilizing massage gun intermittently, applied  to biceps, deltoid, scapular region, and trapezius, in order to reduce pain and fascial restrictions, as well as improve ROM.  03/19/23 -Manual Therapy: myofascial release and trigger point applied to biceps, deltoid, scapular region, and trapezius, in order to reduce pain and fascial restrictions, as well as improve ROM. -A/ROM: seated, flexion, abduction, protraction, horizontal abduction, er/IR, x12 -Shoulder strength: flexion, abduction, curls, protraction x10 3 pounds (2 pounds abduction)  -UBE: level 1, 2.5' forwards and backwards  03/03/23 -Manual Therapy: myofascial release and trigger point applied to  biceps, deltoid, scapular region, and trapezius, in order to reduce pain and fascial restrictions, as well as improve ROM. -A/ROM: seated, flexion, abduction, protraction, horizontal abduction, er/IR, x12 -X to V arms, x12 -Goal Post, x12 -Strengthening: 3#, bicep curls, hammer curls, protraction, x12 -Stretching: bicep on the wall, 4x15" -K-taping: 1 strip applied from mid deltoid up to glenohumeral notch, 2nd strip cut in half and placed on distal deltoid tendons and wrapped around the center tape up to the glenohumeral notch.   PATIENT EDUCATION: Education details: Continue HEP Person educated: Patient Education method: Explanation, Demonstration, and Handouts Education comprehension: verbalized understanding and returned demonstration  HOME EXERCISE PROGRAM: 1/21: A/ROM 1/29: Stretches and PNF strengthening 2/6: Loop Band Exercises 2/12: Bicep strengthening   GOALS: Goals reviewed with patient? Yes   SHORT TERM GOALS: Target date: 03/19/23  Pt will be provided with and educated on HEP to improve mobility in RUE required for use during ADL completion.   Goal status: IN PROGRESS  Pt will decrease pain in RUE to 3/10 or less to improve ability to sleep for 2+ consecutive hours without waking due to pain.   Goal status: IN PROGRESS  2.  Pt will decrease RUE fascial  restrictions to min amounts or less to improve mobility required for functional reaching tasks.   Goal status: IN PROGRESS  3.  Pt will increase RUE A/ROM by 15 degrees to improve ability to use RUE when reaching overhead or behind back during dressing and bathing tasks.   Goal status: MET  4.  Pt will increase RUE strength to 5/5 or greater to improve ability to use RUE when lifting or carrying items during meal preparation/housework/yardwork tasks.   Goal status: REVISED  5.  Pt will return to highest level of function using RUE as non-dominant during functional task completion.   Goal status: IN PROGRESS   ASSESSMENT:  CLINICAL IMPRESSION: Pt completed reassessment this session for recertification. He continues to report periods of significant pain with moderate to severe fascial restrictions. This session OT addressed fascial restrictions with manual therapy, using both myofascial techniques and the massage gun to deeper penetrate the muscles and assist them in releasing. Pt has met his ROM goal and OT upgraded his strength goal to 5/5. Recommending pt continue with skilled OT for 4 more sessions in order to continue strengthening and reducing pain/fascial restrictions. Verbal and tactile cuing provided throughout session for positioning and technique, as well as education to follow up with PCP for referral to orthopedic MD.   PERFORMANCE DEFICITS: in functional skills including in functional skills including ADLs, IADLs, coordination, tone, ROM, strength, pain, fascial restrictions, muscle spasms, and UE functional use.Marland Kitchen    PLAN:  OT FREQUENCY: 1x/week  OT DURATION: 6 weeks  PLANNED INTERVENTIONS: 97168 OT Re-evaluation, 97535 self care/ADL training, 16109 therapeutic exercise, 97530 therapeutic activity, 97140 manual therapy, 97035 ultrasound, 97010 moist heat, 97032 electrical stimulation (manual), passive range of motion, functional mobility training, energy conservation,  coping strategies training, patient/family education, and DME and/or AE instructions  RECOMMENDED OTHER SERVICES: N/A  CONSULTED AND AGREED WITH PLAN OF CARE: Patient  PLAN FOR NEXT SESSION: Manual Therapy, A/ROM, Proximal Shoulder exercises, Isometrics, K-taping   Trish Mage, OTR/L [facility] [phone] Nikolas Casher Rosemarie Beath, OT 03/25/2023, 1:02 PM  [employer] Auth Request  Referring diagnosis code (ICD 10)? M75.21 Treatment diagnosis codes (ICD 10)? (if different than referring diagnosis) M25.511, M25.611, R29.898 What was this (referring dx) caused by? []  Surgery []  Fall [x]  Ongoing issue []   Arthritis []  Other: ____________  Laterality: [x]  Rt []  Lt []  Both  Deficits: [x]  Pain [x]  Stiffness [x]  Weakness []  Edema []  Balance Deficits []  Coordination []  Gait Disturbance [x]  ROM []  Other   Functional Tool Score: FOTO: 59/100  CPT codes: See Planned Interventions listed in the Plan section of the Evaluation.

## 2023-03-25 ENCOUNTER — Other Ambulatory Visit: Payer: Self-pay | Admitting: Medical Genetics

## 2023-03-25 NOTE — Addendum Note (Signed)
 Addended by: Trish Mage E on: 03/25/2023 01:02 PM   Modules accepted: Orders

## 2023-03-26 ENCOUNTER — Other Ambulatory Visit (HOSPITAL_COMMUNITY)
Admission: RE | Admit: 2023-03-26 | Discharge: 2023-03-26 | Disposition: A | Discharge status: Home / Self Care | Payer: Self-pay | Source: Ambulatory Visit | Attending: Oncology | Admitting: Oncology

## 2023-03-31 ENCOUNTER — Ambulatory Visit (HOSPITAL_COMMUNITY): Admitting: Occupational Therapy

## 2023-03-31 ENCOUNTER — Encounter (HOSPITAL_COMMUNITY): Payer: Self-pay | Admitting: Occupational Therapy

## 2023-03-31 DIAGNOSIS — R29898 Other symptoms and signs involving the musculoskeletal system: Secondary | ICD-10-CM

## 2023-03-31 DIAGNOSIS — M25611 Stiffness of right shoulder, not elsewhere classified: Secondary | ICD-10-CM

## 2023-03-31 DIAGNOSIS — M25511 Pain in right shoulder: Secondary | ICD-10-CM

## 2023-03-31 NOTE — Therapy (Signed)
 OUTPATIENT OCCUPATIONAL THERAPY ORTHO TREATMENT NOTE   Patient Name: Austin Bright MRN: [id number] DOB:27-Apr-1945, 78 y.o., male Today's Date: 03/31/2023   END OF SESSION:  OT End of Session - 03/31/23 1145     Visit Number 7    Number of Visits 10    Date for OT Re-Evaluation 04/30/23    Authorization Type [employer]    Authorization Time Period 4 visits approved (03/31/23-04/26/23)    Authorization - Visit Number 1    Authorization - Number of Visits 4    OT Start Time 1103    OT Stop Time 1142    OT Time Calculation (min) 39 min    Activity Tolerance Patient tolerated treatment well    Behavior During Therapy [facility] for tasks assessed/performed             Past Medical History:  Diagnosis Date   Acute diverticulitis 08/17/2020   Acute MI (HCC) 08/2011   Arthritis    Bilateral pneumonia    Diagnosed after STEMI 08/2011   CAD (coronary artery disease)    a. Diagnosed 08/2011 with anterior STEMI due to thrombotic occlusion of mid LAD s/p thrombectomy, PTCA, DES placement 08/23/11.   CHF (congestive heart failure) (HCC)    Chronic combined systolic and diastolic congestive heart failure (HCC) 09/15/2011   Dyslipidemia    Gout    Hypertension    Ischemic cardiomyopathy    a. Initial EF 35% by cath 08/23/11, improved to 40-45% by echo 08/25/11. 50-55% by echo November 2013.   Shortness of breath    Past Surgical History:  Procedure Laterality Date   carpel tunnel Bilateral 2002   COLONOSCOPY WITH PROPOFOL N/A 04/08/2017   Surgeon: Corbin Ade, MD;  diverticulosis in sigmoid and descending colon, internal hemorrhoids, otherwise normal exam.  No recommendations to repeat due to age.   COLONOSCOPY WITH PROPOFOL N/A 03/17/2021   Procedure: COLONOSCOPY WITH PROPOFOL;  Surgeon: Lanelle Bal, DO;  Location: [facility];  Service: Endoscopy;  Laterality: N/A;  8:00am   CORONARY STENT PLACEMENT     CYSTECTOMY  1969   pilonidal cyst   LEFT AND RIGHT HEART CATHETERIZATION  WITH CORONARY ANGIOGRAM N/A 09/15/2011   Procedure: LEFT AND RIGHT HEART CATHETERIZATION WITH CORONARY ANGIOGRAM;  Surgeon: Herby Abraham, MD;  Location: [facility] CATH LAB;  Service: Cardiovascular;  Laterality: N/A;   LEFT HEART CATH N/A 08/23/2011   Procedure: LEFT HEART CATH;  Surgeon: Iran Ouch, MD;  Location: [facility] CATH LAB;  Service: Cardiovascular;  Laterality: N/A;   NO PAST SURGERIES     PERCUTANEOUS CORONARY STENT INTERVENTION (PCI-S)  08/23/2011   Procedure: PERCUTANEOUS CORONARY STENT INTERVENTION (PCI-S);  Surgeon: Iran Ouch, MD;  Location: [facility] CATH LAB;  Service: Cardiovascular;;   Patient Active Problem List   Diagnosis Date Noted   Tendinopathy of right rotator cuff 02/03/2023   Mixed hyperlipidemia 01/23/2022   Encounter for general adult medical examination with abnormal findings 07/18/2021   Prediabetes 07/18/2021   Vitamin D deficiency 07/18/2021   History of diverticulitis 02/24/2021   Pancreatic lesion 02/24/2021   Prostate cancer screening 01/30/2021   Primary insomnia 01/27/2021   Hepatomegaly    Essential hypertension 08/18/2020   Idiopathic gout 11/16/2018   Old anteroseptal myocardial infarction 08/24/2011   Coronary artery disease involving native coronary artery of native heart without angina pectoris 08/24/2011    PCP: Trena Platt, MD REFERRING PROVIDER: Trena Platt, MD  ONSET DATE: 09/01/22  REFERRING DIAG: R shoulder  Pain/ Bicep Tendonitis  THERAPY DIAG:  Right shoulder pain, unspecified chronicity  Shoulder stiffness, right  Other symptoms and signs involving the musculoskeletal system  Rationale for Evaluation and Treatment: Rehabilitation  SUBJECTIVE:   SUBJECTIVE STATEMENT: "I'm feeling somewhat better" Pt accompanied by: self  PERTINENT HISTORY: Pt had a fall in August 2024, resulting in pain in the deltoid, limited ROM, and weakness. PMH significant for CAD s/p stent placement, HTN, HLD, gout and  diverticulitis.  PRECAUTIONS: None  WEIGHT BEARING RESTRICTIONS: No  PAIN:  Are you having pain? No and intermittent pain up to 5/10 with most movements  FALLS: Has patient fallen in last 6 months? Yes. Number of falls 1  PLOF: Independent  PATIENT GOALS: To eliminate the pain  NEXT MD VISIT: None  OBJECTIVE:   HAND DOMINANCE: Left  ADLs: Overall ADLs: Pt having severe pain with all ADL's, as well as lifting and carrying items. Pt unable to reach behind his back for hygiene and pulling up the back of his pants.   FUNCTIONAL OUTCOME MEASURES: FOTO: 60.86 03/24/23: 59.02  UPPER EXTREMITY ROM:       Assessed in seated, er/IR adducted  Active ROM Right eval Right 03/24/23  Shoulder flexion 146 163  Shoulder abduction 154 169  Shoulder internal rotation 90 90  Shoulder external rotation 55 73  (Blank rows = not tested)    UPPER EXTREMITY MMT:     Assessed in seated, er/IR adducted  MMT Right eval Right 03/24/23  Shoulder flexion 4-/5 4+/5  Shoulder abduction 4/5 5/5  Shoulder internal rotation 4+/5 5/5  Shoulder external rotation 4/5 4+/5  (Blank rows = not tested)  SENSATION: WFL  EDEMA: No swelling noted  OBSERVATIONS: Moderate to severe fascial restrictions along the deltoid, bicep tendon, and subscapular region.   TODAY'S TREATMENT:                                                                                                                              DATE:  03/31/23 -Manual Therapy: myofascial release and trigger point applied to biceps, deltoid, scapular region, and trapezius, in order to reduce pain and fascial restrictions, as well as improve ROM. -A/ROM: seated, flexion, abduction, protraction, horizontal abduction, er/IR, x15 -X to V arms, x15 -Goal Post, x15 -Scapular Strengthening: red band, extension, retraction, rows, x12 -Shoulder Strengthening: red band, horizontal abduction, er, IR, flexion, abduction, x12 -UBE: level 1, 2.5' forwards  and backwards  03/24/23 -A/ROM: seated, flexion, abduction, protraction, horizontal abduction, er/IR, x12 -X to V arms, x12 -Goal Post, x12 -Proximal Shoulder Strengthening: paddles, criss cross, circles both directions, x10 each -Measurements for reassessment -Manual Therapy: myofascial release and trigger point, utilizing massage gun intermittently, applied to biceps, deltoid, scapular region, and trapezius, in order to reduce pain and fascial restrictions, as well as improve ROM.  03/19/23 -Manual Therapy: myofascial release and trigger point applied to biceps, deltoid, scapular region, and trapezius, in order to reduce pain and fascial restrictions,  as well as improve ROM. -A/ROM: seated, flexion, abduction, protraction, horizontal abduction, er/IR, x12 -Shoulder strength: flexion, abduction, curls, protraction x10 3 pounds (2 pounds abduction)  -UBE: level 1, 2.5' forwards and backwards   PATIENT EDUCATION: Education details: Scapular and Shoulder Tband  Person educated: Patient Education method: Explanation, Demonstration, and Handouts Education comprehension: verbalized understanding and returned demonstration  HOME EXERCISE PROGRAM: 1/21: A/ROM 1/29: Stretches and PNF strengthening 2/6: Loop Band Exercises 2/12: Bicep strengthening 3/12: Scapular and Shoulder T-band   GOALS: Goals reviewed with patient? Yes   SHORT TERM GOALS: Target date: 03/19/23  Pt will be provided with and educated on HEP to improve mobility in RUE required for use during ADL completion.   Goal status: IN PROGRESS  Pt will decrease pain in RUE to 3/10 or less to improve ability to sleep for 2+ consecutive hours without waking due to pain.   Goal status: IN PROGRESS  2.  Pt will decrease RUE fascial restrictions to min amounts or less to improve mobility required for functional reaching tasks.   Goal status: IN PROGRESS  3.  Pt will increase RUE A/ROM by 15 degrees to improve ability to use  RUE when reaching overhead or behind back during dressing and bathing tasks.   Goal status: MET  4.  Pt will increase RUE strength to 5/5 or greater to improve ability to use RUE when lifting or carrying items during meal preparation/housework/yardwork tasks.   Goal status: REVISED  5.  Pt will return to highest level of function using RUE as non-dominant during functional task completion.   Goal status: IN PROGRESS   ASSESSMENT:  CLINICAL IMPRESSION: This session pt continuing to have significant pain along his biceps and deltoid, which also continue to present with severe fascial restrictions. Pt reports that he has been continuing all of his exercises and stretches, as well as adding in massage gun for fascial release daily. This session OT added more theraband exercises to improve his strength and provide relief along the deltoid and biceps. He was able to complete all with good technique, except for abduction which increased pain significantly. Verbal and tactile cuing provided for positioning and technique throughout session.  PERFORMANCE DEFICITS: in functional skills including in functional skills including ADLs, IADLs, coordination, tone, ROM, strength, pain, fascial restrictions, muscle spasms, and UE functional use.Marland Kitchen    PLAN:  OT FREQUENCY: 1x/week  OT DURATION: 6 weeks  PLANNED INTERVENTIONS: 97168 OT Re-evaluation, 97535 self care/ADL training, 86578 therapeutic exercise, 97530 therapeutic activity, 97140 manual therapy, 97035 ultrasound, 97010 moist heat, 97032 electrical stimulation (manual), passive range of motion, functional mobility training, energy conservation, coping strategies training, patient/family education, and DME and/or AE instructions  RECOMMENDED OTHER SERVICES: N/A  CONSULTED AND AGREED WITH PLAN OF CARE: Patient  PLAN FOR NEXT SESSION: Manual Therapy, A/ROM, Proximal Shoulder exercises, Isometrics, K-taping   Trish Mage, OTR/L [facility]  Outpatient Rehab [phone] Kennyth Arnold, OT 03/31/2023, 11:47 AM

## 2023-03-31 NOTE — Patient Instructions (Signed)
 1) (Home) Extension: Isometric / Bilateral Arm Retraction - Sitting ? ? ?Facing anchor, hold hands and elbow at shoulder height, with elbow bent.  Pull arms back to squeeze shoulder blades together. Repeat 10-15 times. 1-3 times/day.  ? ?2) (Clinic) Extension / Flexion (Assist) ? ? ?Face anchor, pull arms back, keeping elbow straight, and squeze shoulder blades together. ?Repeat 10-15 times. 1-3 times/day.  ? ?Copyright ? [employer]. All rights reserved.  ? ?3) (Home) Retraction: Row - Bilateral (Anchor) ? ? ?Facing anchor, arms reaching forward, pull hands toward stomach, keeping elbows bent and at your sides and pinching shoulder blades together. ?Repeat 10-15 times. 1-3 times/day.  ? ?Copyright ? [employer]. All rights reserved.  ? ? ? ? ?Theraband strengthening: Complete 10-15X, 1-2X/day ? ?1) Shoulder protraction ? ?Anchor band in doorway, stand with back to door. Push your hand forward as much as you can to bringing your shoulder blades forward on your rib cage. ? ? ? ? ? ?2) Shoulder horizontal abduction ? ?Standing with a theraband anchored at chest height, begin with arm straight and some tension in the band. Move your arm out to your side (keeping straight the whole time). Bring the affected arm back to midline. ? ? ? ? ?3) Shoulder Internal Rotation ? ?While holding an elastic band at your side with your elbow bent, start with your hand away from your stomach, then pull the band towards your stomach. Keep your elbow near your side the entire time. ? ? ? ? ?4) Shoulder External Rotation ? ?While holding an elastic band at your side with your elbow bent, start with your hand near your stomach and then pull the band away. Keep your elbow at your side the entire time. ? ? ? ? ?5) Shoulder flexion ? ?While standing with back to the door, holding Theraband at hand level, raise arm in front of you.  Keep elbow straight through entire movement.  ? ? ? ? ?6) Shoulder abduction ? ?While holding an elastic band at your side, draw  up your arm to the side keeping your elbow straight. ?  ? ?

## 2023-04-07 ENCOUNTER — Encounter (HOSPITAL_COMMUNITY): Payer: Self-pay | Admitting: Occupational Therapy

## 2023-04-07 ENCOUNTER — Ambulatory Visit (HOSPITAL_COMMUNITY): Admitting: Occupational Therapy

## 2023-04-07 DIAGNOSIS — M25611 Stiffness of right shoulder, not elsewhere classified: Secondary | ICD-10-CM | POA: Diagnosis not present

## 2023-04-07 DIAGNOSIS — R29898 Other symptoms and signs involving the musculoskeletal system: Secondary | ICD-10-CM

## 2023-04-07 DIAGNOSIS — M25511 Pain in right shoulder: Secondary | ICD-10-CM | POA: Diagnosis not present

## 2023-04-07 NOTE — Therapy (Signed)
 OUTPATIENT OCCUPATIONAL THERAPY ORTHO TREATMENT NOTE   Patient Name: KARI KERTH MRN: [id number] DOB:[birth year], 78 y.o., male Today's Date: 04/07/2023   END OF SESSION:  OT End of Session - 04/07/23 1013     Visit Number 8    Number of Visits 10    Date for OT Re-Evaluation 04/30/23    Authorization Type [employer]    Authorization Time Period 4 visits approved (03/31/23-04/26/23)    Authorization - Visit Number 2    Authorization - Number of Visits 4    OT Start Time 0931    OT Stop Time 1013    OT Time Calculation (min) 42 min    Activity Tolerance Patient tolerated treatment well    Behavior During Therapy [facility] for tasks assessed/performed             Past Medical History:  Diagnosis Date   Acute diverticulitis 08/17/2020   Acute MI (HCC) 08/2011   Arthritis    Bilateral pneumonia    Diagnosed after STEMI 08/2011   CAD (coronary artery disease)    a. Diagnosed 08/2011 with anterior STEMI due to thrombotic occlusion of mid LAD s/p thrombectomy, PTCA, DES placement 08/23/11.   CHF (congestive heart failure) (HCC)    Chronic combined systolic and diastolic congestive heart failure (HCC) 09/15/2011   Dyslipidemia    Gout    Hypertension    Ischemic cardiomyopathy    a. Initial EF 35% by cath 08/23/11, improved to 40-45% by echo 08/25/11. 50-55% by echo November 2013.   Shortness of breath    Past Surgical History:  Procedure Laterality Date   carpel tunnel Bilateral 2002   COLONOSCOPY WITH PROPOFOL N/A 04/08/2017   Surgeon: Corbin Ade, MD;  diverticulosis in sigmoid and descending colon, internal hemorrhoids, otherwise normal exam.  No recommendations to repeat due to age.   COLONOSCOPY WITH PROPOFOL N/A 03/17/2021   Procedure: COLONOSCOPY WITH PROPOFOL;  Surgeon: Lanelle Bal, DO;  Location: [facility];  Service: Endoscopy;  Laterality: N/A;  8:00am   CORONARY STENT PLACEMENT     CYSTECTOMY  1969   pilonidal cyst   LEFT AND RIGHT HEART CATHETERIZATION  WITH CORONARY ANGIOGRAM N/A 09/15/2011   Procedure: LEFT AND RIGHT HEART CATHETERIZATION WITH CORONARY ANGIOGRAM;  Surgeon: Herby Abraham, MD;  Location: [facility] CATH LAB;  Service: Cardiovascular;  Laterality: N/A;   LEFT HEART CATH N/A 08/23/2011   Procedure: LEFT HEART CATH;  Surgeon: Iran Ouch, MD;  Location: [facility] CATH LAB;  Service: Cardiovascular;  Laterality: N/A;   NO PAST SURGERIES     PERCUTANEOUS CORONARY STENT INTERVENTION (PCI-S)  08/23/2011   Procedure: PERCUTANEOUS CORONARY STENT INTERVENTION (PCI-S);  Surgeon: Iran Ouch, MD;  Location: [facility] CATH LAB;  Service: Cardiovascular;;   Patient Active Problem List   Diagnosis Date Noted   Tendinopathy of right rotator cuff 02/03/2023   Mixed hyperlipidemia 01/23/2022   Encounter for general adult medical examination with abnormal findings 07/18/2021   Prediabetes 07/18/2021   Vitamin D deficiency 07/18/2021   History of diverticulitis 02/24/2021   Pancreatic lesion 02/24/2021   Prostate cancer screening 01/30/2021   Primary insomnia 01/27/2021   Hepatomegaly    Essential hypertension 08/18/2020   Idiopathic gout 11/16/2018   Old anteroseptal myocardial infarction 08/24/2011   Coronary artery disease involving native coronary artery of native heart without angina pectoris 08/24/2011    PCP: Trena Platt, MD REFERRING PROVIDER: Trena Platt, MD  ONSET DATE: 09/01/22  REFERRING DIAG: R shoulder  Pain/ Bicep Tendonitis  THERAPY DIAG:  Right shoulder pain, unspecified chronicity  Shoulder stiffness, right  Other symptoms and signs involving the musculoskeletal system  Rationale for Evaluation and Treatment: Rehabilitation  SUBJECTIVE:   SUBJECTIVE STATEMENT: "I am having so much pain lately." Pt accompanied by: self  PERTINENT HISTORY: Pt had a fall in August 2024, resulting in pain in the deltoid, limited ROM, and weakness. PMH significant for CAD s/p stent placement, HTN, HLD, gout and  diverticulitis.  PRECAUTIONS: None  WEIGHT BEARING RESTRICTIONS: No  PAIN:  Are you having pain? No and intermittent pain up to 5/10 with most movements  FALLS: Has patient fallen in last 6 months? Yes. Number of falls 1  PLOF: Independent  PATIENT GOALS: To eliminate the pain  NEXT MD VISIT: None  OBJECTIVE:   HAND DOMINANCE: Left  ADLs: Overall ADLs: Pt having severe pain with all ADL's, as well as lifting and carrying items. Pt unable to reach behind his back for hygiene and pulling up the back of his pants.   FUNCTIONAL OUTCOME MEASURES: FOTO: 60.86 03/24/23: 59.02  UPPER EXTREMITY ROM:       Assessed in seated, er/IR adducted  Active ROM Right eval Right 03/24/23  Shoulder flexion 146 163  Shoulder abduction 154 169  Shoulder internal rotation 90 90  Shoulder external rotation 55 73  (Blank rows = not tested)    UPPER EXTREMITY MMT:     Assessed in seated, er/IR adducted  MMT Right eval Right 03/24/23  Shoulder flexion 4-/5 4+/5  Shoulder abduction 4/5 5/5  Shoulder internal rotation 4+/5 5/5  Shoulder external rotation 4/5 4+/5  (Blank rows = not tested)  SENSATION: WFL  EDEMA: No swelling noted  OBSERVATIONS: Moderate to severe fascial restrictions along the deltoid, bicep tendon, and subscapular region.   TODAY'S TREATMENT:                                                                                                                              DATE:  04/07/23 -Manual Therapy: myofascial release and trigger point applied to biceps, deltoid, scapular region, and trapezius, in order to reduce pain and fascial restrictions, as well as improve ROM. -A/ROM: seated, flexion, abduction, protraction, horizontal abduction, er/IR, x15 -X to V arms, x15 -Goal Post, x15 -Overhead lacing -Theraball Exercises: blue weighted ball, flexion, protraction, overhead press, V ups, circles both directions, x10 -ABC's on the wall: green weighted  ball  03/31/23 -Manual Therapy: myofascial release and trigger point applied to biceps, deltoid, scapular region, and trapezius, in order to reduce pain and fascial restrictions, as well as improve ROM. -A/ROM: seated, flexion, abduction, protraction, horizontal abduction, er/IR, x15 -X to V arms, x15 -Goal Post, x15 -Scapular Strengthening: red band, extension, retraction, rows, x12 -Shoulder Strengthening: red band, horizontal abduction, er, IR, flexion, abduction, x12 -UBE: level 1, 2.5' forwards and backwards  03/24/23 -A/ROM: seated, flexion, abduction, protraction, horizontal abduction, er/IR, x12 -X to V arms, x12 -  Goal Post, x12 -Proximal Shoulder Strengthening: paddles, criss cross, circles both directions, x10 each -Measurements for reassessment -Manual Therapy: myofascial release and trigger point, utilizing massage gun intermittently, applied to biceps, deltoid, scapular region, and trapezius, in order to reduce pain and fascial restrictions, as well as improve ROM.   PATIENT EDUCATION: Education details: Network engineer Tband  Person educated: Patient Education method: Explanation, Demonstration, and Handouts Education comprehension: verbalized understanding and returned demonstration  HOME EXERCISE PROGRAM: 1/21: A/ROM 1/29: Stretches and PNF strengthening 2/6: Loop Band Exercises 2/12: Bicep strengthening 3/12: Scapular and Shoulder T-band   GOALS: Goals reviewed with patient? Yes   SHORT TERM GOALS: Target date: 03/19/23  Pt will be provided with and educated on HEP to improve mobility in RUE required for use during ADL completion.   Goal status: IN PROGRESS  Pt will decrease pain in RUE to 3/10 or less to improve ability to sleep for 2+ consecutive hours without waking due to pain.   Goal status: IN PROGRESS  2.  Pt will decrease RUE fascial restrictions to min amounts or less to improve mobility required for functional reaching tasks.   Goal  status: IN PROGRESS  3.  Pt will increase RUE A/ROM by 15 degrees to improve ability to use RUE when reaching overhead or behind back during dressing and bathing tasks.   Goal status: MET  4.  Pt will increase RUE strength to 5/5 or greater to improve ability to use RUE when lifting or carrying items during meal preparation/housework/yardwork tasks.   Goal status: REVISED  5.  Pt will return to highest level of function using RUE as non-dominant during functional task completion.   Goal status: IN PROGRESS   ASSESSMENT:  CLINICAL IMPRESSION: Pt reports that his pain is worsening and it is constant. He has a large fascial knot in the middle of his deltoid that is creating increased pain and discomfort, even with manual therapy. OT having pt stop resistance band shoulder strengthening, as pt has felt the most intense pain with those exercises. Instead pt started therapy ball exercises and proximal shoulder strengthening which allowed pain to remain lower. Verbal and tactile cuing provided throughout session for positioning and technique.   PERFORMANCE DEFICITS: in functional skills including in functional skills including ADLs, IADLs, coordination, tone, ROM, strength, pain, fascial restrictions, muscle spasms, and UE functional use.Marland Kitchen    PLAN:  OT FREQUENCY: 1x/week  OT DURATION: 6 weeks  PLANNED INTERVENTIONS: 97168 OT Re-evaluation, 97535 self care/ADL training, 91478 therapeutic exercise, 97530 therapeutic activity, 97140 manual therapy, 97035 ultrasound, 97010 moist heat, 97032 electrical stimulation (manual), passive range of motion, functional mobility training, energy conservation, coping strategies training, patient/family education, and DME and/or AE instructions  RECOMMENDED OTHER SERVICES: N/A  CONSULTED AND AGREED WITH PLAN OF CARE: Patient  PLAN FOR NEXT SESSION: Manual Therapy, A/ROM, Proximal Shoulder exercises, Isometrics, K-taping   Trish Mage, OTR/L [facility]  Outpatient Rehab [phone] Chalisa Kobler Rosemarie Beath, OT 04/07/2023, 1:23 PM

## 2023-04-12 ENCOUNTER — Other Ambulatory Visit: Payer: Self-pay | Admitting: Internal Medicine

## 2023-04-12 ENCOUNTER — Encounter: Payer: Self-pay | Admitting: Internal Medicine

## 2023-04-12 DIAGNOSIS — M67911 Unspecified disorder of synovium and tendon, right shoulder: Secondary | ICD-10-CM

## 2023-04-14 ENCOUNTER — Ambulatory Visit (HOSPITAL_COMMUNITY): Admitting: Occupational Therapy

## 2023-04-14 ENCOUNTER — Encounter (HOSPITAL_COMMUNITY): Payer: Self-pay | Admitting: Occupational Therapy

## 2023-04-14 DIAGNOSIS — R29898 Other symptoms and signs involving the musculoskeletal system: Secondary | ICD-10-CM

## 2023-04-14 DIAGNOSIS — M25511 Pain in right shoulder: Secondary | ICD-10-CM | POA: Diagnosis not present

## 2023-04-14 DIAGNOSIS — M25611 Stiffness of right shoulder, not elsewhere classified: Secondary | ICD-10-CM

## 2023-04-14 NOTE — Therapy (Unsigned)
 OUTPATIENT OCCUPATIONAL THERAPY ORTHO TREATMENT NOTE   Patient Name: Austin Bright MRN: [id number] DOB:1945-12-16, 78 y.o., male Today's Date: 04/15/2023  OCCUPATIONAL THERAPY DISCHARGE SUMMARY  Visits from Start of Care: 9  Current functional level related to goals / functional outcomes: Pt has met most OT goals. His ROM and strength are WFL.    Remaining deficits: Pt continues to have intermittent increased pain.    Education / Equipment: Pt provided comprehensive HEP.    Plan: Patient agrees to discharge as strength and ROM are near baseline.       END OF SESSION:  OT End of Session - 04/14/23 1051     Visit Number 9    Number of Visits 10    Date for OT Re-Evaluation 04/30/23    Authorization Type [employer]    Authorization Time Period 4 visits approved (03/31/23-04/26/23)    Authorization - Visit Number 3    Authorization - Number of Visits 4    OT Start Time 1019    OT Stop Time 1051    OT Time Calculation (min) 32 min    Activity Tolerance Patient tolerated treatment well    Behavior During Therapy [facility] for tasks assessed/performed             Past Medical History:  Diagnosis Date   Acute diverticulitis 08/17/2020   Acute MI (HCC) 08/2011   Arthritis    Bilateral pneumonia    Diagnosed after STEMI 08/2011   CAD (coronary artery disease)    a. Diagnosed 08/2011 with anterior STEMI due to thrombotic occlusion of mid LAD s/p thrombectomy, PTCA, DES placement 08/23/11.   CHF (congestive heart failure) (HCC)    Chronic combined systolic and diastolic congestive heart failure (HCC) 09/15/2011   Dyslipidemia    Gout    Hypertension    Ischemic cardiomyopathy    a. Initial EF 35% by cath 08/23/11, improved to 40-45% by echo 08/25/11. 50-55% by echo November 2013.   Shortness of breath    Past Surgical History:  Procedure Laterality Date   carpel tunnel Bilateral 2002   COLONOSCOPY WITH PROPOFOL N/A 04/08/2017   Surgeon: Corbin Ade, MD;  diverticulosis in  sigmoid and descending colon, internal hemorrhoids, otherwise normal exam.  No recommendations to repeat due to age.   COLONOSCOPY WITH PROPOFOL N/A 03/17/2021   Procedure: COLONOSCOPY WITH PROPOFOL;  Surgeon: Lanelle Bal, DO;  [facility];  Service: Endoscopy;  Laterality: N/A;  8:00am   CORONARY STENT PLACEMENT     CYSTECTOMY  1969   pilonidal cyst   LEFT AND RIGHT HEART CATHETERIZATION WITH CORONARY ANGIOGRAM N/A 09/15/2011   Procedure: LEFT AND RIGHT HEART CATHETERIZATION WITH CORONARY ANGIOGRAM;  Surgeon: Herby Abraham, MD;  Location: [facility] CATH LAB;  Service: Cardiovascular;  Laterality: N/A;   LEFT HEART CATH N/A 08/23/2011   Procedure: LEFT HEART CATH;  Surgeon: Iran Ouch, MD;  Location: [facility] CATH LAB;  Service: Cardiovascular;  Laterality: N/A;   NO PAST SURGERIES     PERCUTANEOUS CORONARY STENT INTERVENTION (PCI-S)  08/23/2011   Procedure: PERCUTANEOUS CORONARY STENT INTERVENTION (PCI-S);  Surgeon: Iran Ouch, MD;  Location: [facility] CATH LAB;  Service: Cardiovascular;;   Patient Active Problem List   Diagnosis Date Noted   Tendinopathy of right rotator cuff 02/03/2023   Mixed hyperlipidemia 01/23/2022   Encounter for general adult medical examination with abnormal findings 07/18/2021   Prediabetes 07/18/2021   Vitamin D deficiency 07/18/2021   History of diverticulitis  02/24/2021   Pancreatic lesion 02/24/2021   Prostate cancer screening 01/30/2021   Primary insomnia 01/27/2021   Hepatomegaly    Essential hypertension 08/18/2020   Idiopathic gout 11/16/2018   Old anteroseptal myocardial infarction 08/24/2011   Coronary artery disease involving native coronary artery of native heart without angina pectoris 08/24/2011    PCP: Trena Platt, MD REFERRING PROVIDER: Trena Platt, MD  ONSET DATE: 09/01/22  REFERRING DIAG: R shoulder Pain/ Bicep Tendonitis  THERAPY DIAG:  Right shoulder pain, unspecified chronicity  Shoulder stiffness,  right  Other symptoms and signs involving the musculoskeletal system  Rationale for Evaluation and Treatment: Rehabilitation  SUBJECTIVE:   SUBJECTIVE STATEMENT: "This week is so much better" Pt accompanied by: self  PERTINENT HISTORY: Pt had a fall in August 2024, resulting in pain in the deltoid, limited ROM, and weakness. PMH significant for CAD s/p stent placement, HTN, HLD, gout and diverticulitis.  PRECAUTIONS: None  WEIGHT BEARING RESTRICTIONS: No  PAIN:  Are you having pain? No  FALLS: Has patient fallen in last 6 months? Yes. Number of falls 1  PLOF: Independent  PATIENT GOALS: To eliminate the pain  NEXT MD VISIT: None  OBJECTIVE:   HAND DOMINANCE: Left  ADLs: Overall ADLs: Pt having severe pain with all ADL's, as well as lifting and carrying items. Pt unable to reach behind his back for hygiene and pulling up the back of his pants.   FUNCTIONAL OUTCOME MEASURES: FOTO: 60.86 03/24/23: 59.02 04/14/23: 68.69  UPPER EXTREMITY ROM:       Assessed in seated, er/IR adducted  Active ROM Right eval Right 03/24/23 Right 04/14/23  Shoulder flexion 146 163 167  Shoulder abduction 154 169 171  Shoulder internal rotation 90 90 90  Shoulder external rotation 55 73 72  (Blank rows = not tested)    UPPER EXTREMITY MMT:     Assessed in seated, er/IR adducted  MMT Right eval Right 03/24/23 Right 04/14/23  Shoulder flexion 4-/5 4+/5 5/5  Shoulder abduction 4/5 5/5 5/5  Shoulder internal rotation 4+/5 5/5 5/5  Shoulder external rotation 4/5 4+/5 5/5  (Blank rows = not tested)  SENSATION: WFL  EDEMA: No swelling noted  OBSERVATIONS: Moderate to severe fascial restrictions along the deltoid, bicep tendon, and subscapular region.   TODAY'S TREATMENT:                                                                                                                              DATE:  04/14/23 -Manual Therapy: myofascial release and trigger point applied to  biceps, deltoid, scapular region, and trapezius, in order to reduce pain and fascial restrictions, as well as improve ROM. -A/ROM: seated, flexion, abduction, protraction, horizontal abduction, er/IR, x15 -X to V arms, x15 -Goal Post, x15 -Measurements for Reassessments  04/07/23 -Manual Therapy: myofascial release and trigger point applied to biceps, deltoid, scapular region, and trapezius, in order to reduce pain and fascial restrictions, as well as improve ROM. -A/ROM:  seated, flexion, abduction, protraction, horizontal abduction, er/IR, x15 -X to V arms, x15 -Goal Post, x15 -Overhead lacing -Theraball Exercises: blue weighted ball, flexion, protraction, overhead press, V ups, circles both directions, x10 -ABC's on the wall: green weighted ball  03/31/23 -Manual Therapy: myofascial release and trigger point applied to biceps, deltoid, scapular region, and trapezius, in order to reduce pain and fascial restrictions, as well as improve ROM. -A/ROM: seated, flexion, abduction, protraction, horizontal abduction, er/IR, x15 -X to V arms, x15 -Goal Post, x15 -Scapular Strengthening: red band, extension, retraction, rows, x12 -Shoulder Strengthening: red band, horizontal abduction, er, IR, flexion, abduction, x12 -UBE: level 1, 2.5' forwards and backwards   PATIENT EDUCATION: Education details: Scapular and Shoulder Tband  Person educated: Patient Education method: Explanation, Demonstration, and Handouts Education comprehension: verbalized understanding and returned demonstration  HOME EXERCISE PROGRAM: 1/21: A/ROM 1/29: Stretches and PNF strengthening 2/6: Loop Band Exercises 2/12: Bicep strengthening 3/12: Scapular and Shoulder T-band   GOALS: Goals reviewed with patient? Yes   SHORT TERM GOALS: Target date: 03/19/23  Pt will be provided with and educated on HEP to improve mobility in RUE required for use during ADL completion.   Goal status: MET  Pt will decrease pain in  RUE to 3/10 or less to improve ability to sleep for 2+ consecutive hours without waking due to pain.   Goal status: NOT MET  2.  Pt will decrease RUE fascial restrictions to min amounts or less to improve mobility required for functional reaching tasks.   Goal status: MET  3.  Pt will increase RUE A/ROM by 15 degrees to improve ability to use RUE when reaching overhead or behind back during dressing and bathing tasks.   Goal status: MET  4.  Pt will increase RUE strength to 5/5 or greater to improve ability to use RUE when lifting or carrying items during meal preparation/housework/yardwork tasks.   Goal status: MET  5.  Pt will return to highest level of function using RUE as non-dominant during functional task completion.   Goal status: MET   ASSESSMENT:  CLINICAL IMPRESSION: Pt completed reassessment for discharge this session. He continues to have intermittent increased pain, however at this time, pain is well managed. He has good ROM and strength is back to "normal". Pt has no further skilled OT needs at this time and will be discharged from OT.   PERFORMANCE DEFICITS: in functional skills including in functional skills including ADLs, IADLs, coordination, tone, ROM, strength, pain, fascial restrictions, muscle spasms, and UE functional use.Marland Kitchen    PLAN:  OT FREQUENCY: 1x/week  OT DURATION: 6 weeks  PLANNED INTERVENTIONS: 97168 OT Re-evaluation, 97535 self care/ADL training, 13244 therapeutic exercise, 97530 therapeutic activity, 97140 manual therapy, 97035 ultrasound, 97010 moist heat, 97032 electrical stimulation (manual), passive range of motion, functional mobility training, energy conservation, coping strategies training, patient/family education, and DME and/or AE instructions  RECOMMENDED OTHER SERVICES: N/A  CONSULTED AND AGREED WITH PLAN OF CARE: Patient  PLAN FOR NEXT SESSION: DISCHARGE   Trish Mage, OTR/L [facility] [phone] Kennyth Arnold, OT 04/15/2023, 9:21 AM

## 2023-04-21 ENCOUNTER — Encounter (HOSPITAL_COMMUNITY): Admitting: Occupational Therapy

## 2023-04-23 ENCOUNTER — Ambulatory Visit (INDEPENDENT_AMBULATORY_CARE_PROVIDER_SITE_OTHER): Admitting: Orthopedic Surgery

## 2023-04-23 VITALS — BP 124/82 | HR 82 | Ht 70.0 in | Wt 212.0 lb

## 2023-04-23 DIAGNOSIS — S46011A Strain of muscle(s) and tendon(s) of the rotator cuff of right shoulder, initial encounter: Secondary | ICD-10-CM

## 2023-04-23 MED ORDER — METHYLPREDNISOLONE ACETATE 40 MG/ML IJ SUSP
40.0000 mg | Freq: Once | INTRAMUSCULAR | Status: AC
  Administered 2023-04-23: 40 mg via INTRA_ARTICULAR

## 2023-04-23 NOTE — Progress Notes (Signed)
 Office Visit Note   Patient: Austin Bright           Date of Birth: 07/21/1945           MRN: [id number] Visit Date: 04/23/2023 Requested by: Anabel Halon, MD [address] [place],  [place] [address] PCP: Anabel Halon, MD   Assessment & Plan:  78 year old male right shoulder injury completed physical therapy still having pain recommend MRI  Inject right shoulder subacromial space   Procedure note the subacromial injection shoulder RIGHT    Verbal consent was obtained to inject the  RIGHT   Shoulder  Timeout was completed to confirm the injection site is a subacromial space of the  RIGHT  shoulder   Medication used Depo-Medrol 40 mg and lidocaine 1% 3 cc  Anesthesia was provided by ethyl chloride  The injection was performed in the RIGHT  posterior subacromial space. After pinning the skin with alcohol and anesthetized the skin with ethyl chloride the subacromial space was injected using a 20-gauge needle. There were no complications  Sterile dressing was applied.    Encounter Diagnosis  Name Primary?   Traumatic complete tear of right rotator cuff, initial encounter Yes    Meds ordered this encounter  Medications   methylPREDNISolone acetate (DEPO-MEDROL) injection 40 mg     Subjective: Chief Complaint  Patient presents with   Shoulder Pain    After a fall from flatbed of truck Aug '66    HPI: 78 year old male history of coronary artery disease idiopathic gout hypertension diverticulitis vitamin D deficiency hyperlipidemia rotator cuff syndrome (over 25 years ago) presents with pain right shoulder after falling in August 2024.  He underwent physical therapy takes Aleve for pain.  He has good pain control with Aleve he has regained most of his range of motion except for internal rotation which is a little bit less than the opposite side comes in for evaluation of ongoing pain  Also concerned that primary care physician recommended that he do not take  Aleve and the Aleve is what he is taking to control pain  ROS: Currently no chest pain or shortness of breath   Images personally read and my interpretation : His shoulder [facility] from [facility] was normal  Visit Diagnoses:  1. Traumatic complete tear of right rotator cuff, initial encounter      Follow-Up Instructions: Return for MRI RESULTS, IN OFFICE.    Objective: Vital Signs: BP 124/82   Pulse 82   Ht 5\' 10"  (1.778 m)   Wt 212 lb (96.2 kg)   BMI 30.42 kg/m   Physical Exam Vitals and nursing note reviewed. Exam conducted with a chaperone present.  Constitutional:      General: He is not in acute distress.    Appearance: Normal appearance. He is normal weight. He is not ill-appearing, toxic-appearing or diaphoretic.  HENT:     Head: Normocephalic and atraumatic.  Eyes:     General: No scleral icterus.       Right eye: No discharge.        Left eye: No discharge.     Extraocular Movements: Extraocular movements intact.     Pupils: Pupils are equal, round, and reactive to light.  Cardiovascular:     Rate and Rhythm: Normal rate.     Pulses: Normal pulses.  Pulmonary:     Effort: Pulmonary effort is normal.  Musculoskeletal:     Comments: Right shoulder  The patient has several  cysts and sort of various areas on his back which are chronic  Tenderness deltoid  Range of motion flexion normal abduction normal internal rotation 4 level difference right to left  External rotation normal  Strength test in straight abduction 3 out of 5, empty can position 3 out of 5  Stability test normal    Skin:    General: Skin is warm and dry.     Capillary Refill: Capillary refill takes less than 2 seconds.  Neurological:     General: No focal deficit present.     Mental Status: He is alert and oriented to person, place, and time.     Sensory: No sensory deficit.     Gait: Gait normal.  Psychiatric:        Mood and Affect: Mood normal.          Specialty Comments:  No specialty comments available.  Imaging: No results found.   PMFS History: Patient Active Problem List   Diagnosis Date Noted   Tendinopathy of right rotator cuff 02/03/2023   Mixed hyperlipidemia 01/23/2022   Encounter for general adult medical examination with abnormal findings 07/18/2021   Prediabetes 07/18/2021   Vitamin D deficiency 07/18/2021   History of diverticulitis 02/24/2021   Pancreatic lesion 02/24/2021   Prostate cancer screening 01/30/2021   Primary insomnia 01/27/2021   Hepatomegaly    Essential hypertension 08/18/2020   Idiopathic gout 11/16/2018   Neck mass 12/31/2015   Old anteroseptal myocardial infarction 08/24/2011   Coronary artery disease involving native coronary artery of native heart without angina pectoris 08/24/2011   Past Medical History:  Diagnosis Date   Acute diverticulitis 08/17/2020   Acute MI (HCC) 08/2011   Arthritis    Bilateral pneumonia    Diagnosed after STEMI 08/2011   CAD (coronary artery disease)    a. Diagnosed 08/2011 with anterior STEMI due to thrombotic occlusion of mid LAD s/p thrombectomy, PTCA, DES placement 08/23/11.   CHF (congestive heart failure) (HCC)    Chronic combined systolic and diastolic congestive heart failure (HCC) 09/15/2011   Dyslipidemia    Gout    Hypertension    Ischemic cardiomyopathy    a. Initial EF 35% by cath 08/23/11, improved to 40-45% by echo 08/25/11. 50-55% by echo November 2013.   Shortness of breath     Family History  Problem Relation Age of Onset   Stroke Mother    Hypertension Father    Colon cancer Neg Hx    Pancreatic cancer Neg Hx     Past Surgical History:  Procedure Laterality Date   carpel tunnel Bilateral 2002   COLONOSCOPY WITH PROPOFOL N/A 04/08/2017   Surgeon: Corbin Ade, MD;  diverticulosis in sigmoid and descending colon, internal hemorrhoids, otherwise normal exam.  No recommendations to repeat due to age.   COLONOSCOPY WITH  PROPOFOL N/A 03/17/2021   Procedure: COLONOSCOPY WITH PROPOFOL;  Surgeon: Lanelle Bal, DO;  [facility];  Service: Endoscopy;  Laterality: N/A;  8:00am   CORONARY STENT PLACEMENT     CYSTECTOMY  1969   pilonidal cyst   LEFT AND RIGHT HEART CATHETERIZATION WITH CORONARY ANGIOGRAM N/A 09/15/2011   Procedure: LEFT AND RIGHT HEART CATHETERIZATION WITH CORONARY ANGIOGRAM;  Surgeon: Herby Abraham, MD;  Location: [facility] CATH LAB;  Service: Cardiovascular;  Laterality: N/A;   LEFT HEART CATH N/A 08/23/2011   Procedure: LEFT HEART CATH;  Surgeon: Iran Ouch, MD;  Location: [facility] CATH LAB;  Service: Cardiovascular;  Laterality: N/A;  NO PAST SURGERIES     PERCUTANEOUS CORONARY STENT INTERVENTION (PCI-S)  08/23/2011   Procedure: PERCUTANEOUS CORONARY STENT INTERVENTION (PCI-S);  Surgeon: Iran Ouch, MD;  Location: [facility] CATH LAB;  Service: Cardiovascular;;   Social History   Occupational History   Occupation: Child psychotherapist  Tobacco Use   Smoking status: Former    Current packs/day: 0.00    Average packs/day: 1 pack/day for 25.0 years (25.0 ttl pk-yrs)    Types: Cigarettes    Start date: 08/23/1954    Quit date: 08/23/1979    Years since quitting: 43.6   Smokeless tobacco: Never  Vaping Use   Vaping status: Some Days   Substances: Nicotine  Substance and Sexual Activity   Alcohol use: Yes    Alcohol/week: 4.0 standard drinks of alcohol    Types: 2 Cans of beer, 2 Shots of liquor per week    Comment: 1-2 drinks on the weekends   Drug use: No   Sexual activity: Yes

## 2023-04-23 NOTE — Progress Notes (Signed)
  Intake history:  Ht 5\' 10"  (1.778 m)   Wt 212 lb (96.2 kg)   BMI 30.42 kg/m  Body mass index is 30.42 kg/m.    WHAT ARE WE SEEING YOU FOR TODAY?   right shoulder  How long has this bothered you? (DOI?DOS?WS?)  8 month(s) ago  Anticoag.  No  Diabetes No  Heart disease No  Hypertension Yes  SMOKING HX No  Kidney disease No  Any ALLERGIES ______________________________________________   Treatment:  Have you taken:  Tylenol No  Advil Yes  Had PT Yes  Had injection No  Other  _________________________

## 2023-04-23 NOTE — Addendum Note (Signed)
 Addended by: Elvina Mattes T on: 04/23/2023 10:32 AM   Modules accepted: Orders

## 2023-04-26 ENCOUNTER — Encounter: Payer: Self-pay | Admitting: Internal Medicine

## 2023-04-26 ENCOUNTER — Other Ambulatory Visit: Payer: Self-pay | Admitting: Internal Medicine

## 2023-04-26 DIAGNOSIS — F40240 Claustrophobia: Secondary | ICD-10-CM

## 2023-04-26 MED ORDER — DIAZEPAM 10 MG PO TABS: 10.0000 mg | ORAL_TABLET | Freq: Once | ORAL | 0 refills | Status: AC

## 2023-04-28 ENCOUNTER — Ambulatory Visit (HOSPITAL_COMMUNITY)
Admission: RE | Admit: 2023-04-28 | Discharge: 2023-04-28 | Disposition: A | Discharge status: Home / Self Care | Source: Ambulatory Visit | Attending: Orthopedic Surgery | Admitting: Orthopedic Surgery

## 2023-04-28 DIAGNOSIS — S46011A Strain of muscle(s) and tendon(s) of the rotator cuff of right shoulder, initial encounter: Secondary | ICD-10-CM | POA: Insufficient documentation

## 2023-04-28 DIAGNOSIS — M25411 Effusion, right shoulder: Secondary | ICD-10-CM | POA: Diagnosis not present

## 2023-04-28 DIAGNOSIS — M129 Arthropathy, unspecified: Secondary | ICD-10-CM | POA: Diagnosis not present

## 2023-04-28 DIAGNOSIS — M25511 Pain in right shoulder: Secondary | ICD-10-CM | POA: Diagnosis not present

## 2023-04-28 DIAGNOSIS — M7581 Other shoulder lesions, right shoulder: Secondary | ICD-10-CM | POA: Diagnosis not present

## 2023-05-13 ENCOUNTER — Ambulatory Visit: Admitting: Orthopedic Surgery

## 2023-05-13 DIAGNOSIS — S46011A Strain of muscle(s) and tendon(s) of the rotator cuff of right shoulder, initial encounter: Secondary | ICD-10-CM

## 2023-05-13 NOTE — Progress Notes (Signed)
 Follow-up visit  MRI results right shoulder  Chief complaint right shoulder pain  Encounter Diagnosis  Name Primary?   Traumatic complete tear of right rotator cuff, initial encounter Yes   78 year old male fell in August injured his right shoulder he had therapy he came in for evaluation.  One of his main problems he cannot swing his golf club his range of motion is pretty good but he does have some occasional pain for which he will take some Aleve  intermittently   I will include the report but basically he has a massive cuff tear right shoulder with retraction.  He has some cardiovascular disease recovered from an MI 12 years ago  He does not have a lot of pain but does have weakness his range of motion is actually very good  We showed him his MRI  We talked about surgical intervention but he would like to think this over and then he will get back to us  if needed   IMPRESSION: 1. Severe supraspinatus tendinosis with a complete full-thickness, full width tear and 2.4 cm of retraction. 2. Moderate infraspinatus tendinosis with a small full-thickness tear along the anterior-most aspect. 3. Mild subscapularis tendinosis. 4. Severe tendinosis of the intra-articular portion of the long head of the biceps tendon. 5. Moderate partial discarded loss of the glenohumeral joint.     Electronically Signed   By: Onnie Bilis M.D.   On: 05/02/2023 08:10

## 2023-05-18 LAB — GENECONNECT MOLECULAR SCREEN

## 2023-05-20 ENCOUNTER — Telehealth: Payer: Self-pay | Admitting: Medical Genetics

## 2023-05-20 NOTE — Progress Notes (Signed)
 [name]  05/20/23 1:37 PM  FIRST ATTEMPT: Left message for patient to call back.   [facility]  05/20/23 1:39 PM  Confirmed I was speaking with Austin Bright II [id number] by using name and DOB. Informed participant the reason for this call is to follow-up on a recent blood sample the participant provided at one of the [facility] lab locations. Informed participant the test was not able to be completed with this sample and apologized for the inconvenience. Participant was requested to provide a new sample at one of our participating labs at no cost so that participant can continue participation and receive test results. Participant agreed to provide another sample. Participant was provided the [employer] program website to learn why this may have happened. Participant was thanked for their time and continued support of the above study.

## 2023-05-21 ENCOUNTER — Other Ambulatory Visit: Payer: Self-pay | Admitting: Cardiology

## 2023-05-24 ENCOUNTER — Other Ambulatory Visit: Payer: Self-pay | Admitting: Medical Genetics

## 2023-05-24 DIAGNOSIS — Z006 Encounter for examination for normal comparison and control in clinical research program: Secondary | ICD-10-CM

## 2023-05-24 NOTE — Progress Notes (Signed)
 Initial result was a TNP. New order requested. Confirmed consent on file.

## 2023-05-25 ENCOUNTER — Other Ambulatory Visit (HOSPITAL_COMMUNITY)
Admission: RE | Admit: 2023-05-25 | Discharge: 2023-05-25 | Disposition: A | Discharge status: Home / Self Care | Payer: Self-pay | Source: Ambulatory Visit | Attending: Oncology | Admitting: Oncology

## 2023-05-25 DIAGNOSIS — Z006 Encounter for examination for normal comparison and control in clinical research program: Secondary | ICD-10-CM

## 2023-06-04 LAB — GENECONNECT MOLECULAR SCREEN: Genetic Analysis Overall Interpretation: NEGATIVE

## 2023-07-19 ENCOUNTER — Other Ambulatory Visit: Payer: Self-pay | Admitting: Cardiology

## 2023-08-02 DIAGNOSIS — E782 Mixed hyperlipidemia: Secondary | ICD-10-CM | POA: Diagnosis not present

## 2023-08-02 DIAGNOSIS — Z125 Encounter for screening for malignant neoplasm of prostate: Secondary | ICD-10-CM | POA: Diagnosis not present

## 2023-08-02 DIAGNOSIS — R7303 Prediabetes: Secondary | ICD-10-CM | POA: Diagnosis not present

## 2023-08-02 DIAGNOSIS — I10 Essential (primary) hypertension: Secondary | ICD-10-CM | POA: Diagnosis not present

## 2023-08-02 DIAGNOSIS — E559 Vitamin D deficiency, unspecified: Secondary | ICD-10-CM | POA: Diagnosis not present

## 2023-08-03 ENCOUNTER — Ambulatory Visit (INDEPENDENT_AMBULATORY_CARE_PROVIDER_SITE_OTHER): Payer: Medicare PPO | Admitting: Internal Medicine

## 2023-08-03 ENCOUNTER — Other Ambulatory Visit (HOSPITAL_COMMUNITY): Payer: Self-pay

## 2023-08-03 ENCOUNTER — Encounter: Payer: Self-pay | Admitting: Internal Medicine

## 2023-08-03 ENCOUNTER — Telehealth: Payer: Self-pay | Admitting: Pharmacy Technician

## 2023-08-03 VITALS — BP 118/68 | HR 63 | Ht 70.0 in | Wt 222.6 lb

## 2023-08-03 DIAGNOSIS — M67911 Unspecified disorder of synovium and tendon, right shoulder: Secondary | ICD-10-CM

## 2023-08-03 DIAGNOSIS — I2510 Atherosclerotic heart disease of native coronary artery without angina pectoris: Secondary | ICD-10-CM

## 2023-08-03 DIAGNOSIS — I10 Essential (primary) hypertension: Secondary | ICD-10-CM | POA: Diagnosis not present

## 2023-08-03 LAB — CMP14+EGFR
ALT: 31 IU/L (ref 0–44)
AST: 33 IU/L (ref 0–40)
Albumin: 4.4 g/dL (ref 3.8–4.8)
Alkaline Phosphatase: 68 IU/L (ref 44–121)
BUN/Creatinine Ratio: 14 (ref 10–24)
BUN: 17 mg/dL (ref 8–27)
Bilirubin Total: 0.7 mg/dL (ref 0.0–1.2)
CO2: 20 mmol/L (ref 20–29)
Calcium: 9.2 mg/dL (ref 8.6–10.2)
Chloride: 97 mmol/L (ref 96–106)
Creatinine, Ser: 1.23 mg/dL (ref 0.76–1.27)
Globulin, Total: 2.4 g/dL (ref 1.5–4.5)
Glucose: 109 mg/dL — ABNORMAL HIGH (ref 70–99)
Potassium: 4.5 mmol/L (ref 3.5–5.2)
Sodium: 137 mmol/L (ref 134–144)
Total Protein: 6.8 g/dL (ref 6.0–8.5)
eGFR: 61 mL/min/1.73 (ref >59)

## 2023-08-03 LAB — LIPID PANEL
Chol/HDL Ratio: 2.5 ratio (ref 0.0–5.0)
Cholesterol, Total: 117 mg/dL (ref 100–199)
HDL: 47 mg/dL (ref >39)
LDL Chol Calc (NIH): 52 mg/dL (ref 0–99)
Triglycerides: 95 mg/dL (ref 0–149)
VLDL Cholesterol Cal: 18 mg/dL (ref 5–40)

## 2023-08-03 LAB — HEMOGLOBIN A1C
Est. average glucose Bld gHb Est-mCnc: 131 mg/dL
Hgb A1c MFr Bld: 6.2 % — ABNORMAL HIGH (ref 4.8–5.6)

## 2023-08-03 LAB — CBC WITH DIFFERENTIAL/PLATELET
Basophils Absolute: 0 x10E3/uL (ref 0.0–0.2)
Basos: 0 %
EOS (ABSOLUTE): 0.1 x10E3/uL (ref 0.0–0.4)
Eos: 1 %
Hematocrit: 45.8 % (ref 37.5–51.0)
Hemoglobin: 14.9 g/dL (ref 13.0–17.7)
Immature Grans (Abs): 0 x10E3/uL (ref 0.0–0.1)
Immature Granulocytes: 0 %
Lymphocytes Absolute: 1.5 x10E3/uL (ref 0.7–3.1)
Lymphs: 22 %
MCH: 31.1 pg (ref 26.6–33.0)
MCHC: 32.5 g/dL (ref 31.5–35.7)
MCV: 96 fL (ref 79–97)
Monocytes Absolute: 0.8 x10E3/uL (ref 0.1–0.9)
Monocytes: 12 %
Neutrophils Absolute: 4.6 x10E3/uL (ref 1.4–7.0)
Neutrophils: 65 %
Platelets: 167 x10E3/uL (ref 150–450)
RBC: 4.79 x10E6/uL (ref 4.14–5.80)
RDW: 14.1 % (ref 11.6–15.4)
WBC: 7 x10E3/uL (ref 3.4–10.8)

## 2023-08-03 LAB — PSA: Prostate Specific Ag, Serum: 1 ng/mL (ref 0.0–4.0)

## 2023-08-03 LAB — VITAMIN D 25 HYDROXY (VIT D DEFICIENCY, FRACTURES): Vit D, 25-Hydroxy: 32.8 ng/mL (ref 30.0–100.0)

## 2023-08-03 LAB — TSH: TSH: 1.14 u[IU]/mL (ref 0.450–4.500)

## 2023-08-03 MED ORDER — WEGOVY 0.25 MG/0.5ML ~~LOC~~ SOAJ: 0.2500 mg | SUBCUTANEOUS | 0 refills | Status: DC

## 2023-08-03 MED ORDER — OZEMPIC (0.25 OR 0.5 MG/DOSE) 2 MG/3ML ~~LOC~~ SOPN: PEN_INJECTOR | SUBCUTANEOUS | 1 refills | Status: DC

## 2023-08-03 NOTE — Progress Notes (Addendum)
 Established Patient Office Visit  Subjective:  Patient ID: Austin Bright, male    DOB: 06-Apr-1945  Age: 78 y.o. MRN: [id number]  CC:  Chief Complaint  Patient presents with   Shoulder Pain    Concerns about his shoulder pain from previous visit.    Coronary Artery Disease   Hypertension    HPI Austin Bright is a 78 y.o. male with past medical history of CAD s/p stent placement, HTN, HLD, gout and diverticulitis who presents for f/u of his chronic medical conditions.  HTN and CAD: BP is well-controlled. Takes medications regularly. Patient denies headache, dizziness, chest pain, dyspnea or palpitations.  He follows up with cardiology.  He has a history of stent placement, and takes aspirin  and statin.  He also takes metoprolol  and lisinopril .  Right shoulder/upper arm pain: He had a mechanical fall from his truck, landing on the left shoulder on 09/06/22.  He had MRI of right shoulder, which showed severe supraspinatus tendinosis with a complete full-thickness tear, moderate infraspinatus tendinosis with a full-thickness tear and severe tendinosis of the biceps tendon.  Has been evaluated by orthopedic surgeon, but preferred to wait for the surgery.  He has been taking Aleve  with relief of pain.  He has regained range of motion, but still has pain upon extension, especially in upper arm area. Denies any numbness or weakness of UE.    Past Medical History:  Diagnosis Date   Acute diverticulitis 08/17/2020   Acute MI (HCC) 08/2011   Arthritis    Bilateral pneumonia    Diagnosed after STEMI 08/2011   CAD (coronary artery disease)    a. Diagnosed 08/2011 with anterior STEMI due to thrombotic occlusion of mid LAD s/p thrombectomy, PTCA, DES placement 08/23/11.   CHF (congestive heart failure) (HCC)    Chronic combined systolic and diastolic congestive heart failure (HCC) 09/15/2011   Dyslipidemia    Gout    Hypertension    Ischemic cardiomyopathy    a. Initial EF 35% by cath  08/23/11, improved to 40-45% by echo 08/25/11. 50-55% by echo November 2013.   Shortness of breath     Past Surgical History:  Procedure Laterality Date   carpel tunnel Bilateral 2002   COLONOSCOPY WITH PROPOFOL  N/A 04/08/2017   Surgeon: [name], MD;  diverticulosis in sigmoid and descending colon, internal hemorrhoids, otherwise normal exam.  No recommendations to repeat due to age.   COLONOSCOPY WITH PROPOFOL  N/A 03/17/2021   Procedure: COLONOSCOPY WITH PROPOFOL ;  Surgeon: [name], DO;  Location: [facility];  Service: Endoscopy;  Laterality: N/A;  8:00am   CORONARY STENT PLACEMENT     CYSTECTOMY  1969   pilonidal cyst   LEFT AND RIGHT HEART CATHETERIZATION WITH CORONARY ANGIOGRAM N/A 09/15/2011   Procedure: LEFT AND RIGHT HEART CATHETERIZATION WITH CORONARY ANGIOGRAM;  Surgeon: [name], MD;  Location: [facility] CATH LAB;  Service: Cardiovascular;  Laterality: N/A;   LEFT HEART CATH N/A 08/23/2011   Procedure: LEFT HEART CATH;  Surgeon: [name], MD;  Location: [facility] CATH LAB;  Service: Cardiovascular;  Laterality: N/A;   NO PAST SURGERIES     PERCUTANEOUS CORONARY STENT INTERVENTION (PCI-S)  08/23/2011   Procedure: PERCUTANEOUS CORONARY STENT INTERVENTION (PCI-S);  Surgeon: [name], MD;  Location: [facility] CATH LAB;  Service: Cardiovascular;;    Family History  Problem Relation Age of Onset   Stroke Mother    Hypertension Father    Colon cancer Neg Hx  Pancreatic cancer Neg Hx     Social History   Socioeconomic History   Marital status: Married    Spouse name: Not on file   Number of children: Not on file   Years of education: Not on file   Highest education level: Master's degree (e.g., MA, MS, MEng, MEd, MSW, MBA)  Occupational History   Occupation: Child psychotherapist  Tobacco Use   Smoking status: Former    Current packs/day: 0.00    Average packs/day: 1 pack/day for 25.0 years (25.0 ttl pk-yrs)    Types: Cigarettes    Start date: 08/23/1954     Quit date: 08/23/1979    Years since quitting: 43.9   Smokeless tobacco: Never  Vaping Use   Vaping status: Some Days   Substances: Nicotine  Substance and Sexual Activity   Alcohol use: Yes    Alcohol/week: 4.0 standard drinks of alcohol    Types: 2 Cans of beer, 2 Shots of liquor per week    Comment: 1-2 drinks on the weekends   Drug use: No   Sexual activity: Yes  Other Topics Concern   Not on file  Social History Narrative   Widower since 39,married for 10 years.Lives alone,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      11/09/2022 Remarried. Wifes name is [name].    Social Drivers of Corporate investment banker Strain: Low Risk  (07/30/2023)   Overall Financial Resource Strain (CARDIA)    Difficulty of Paying Living Expenses: Not hard at all  Food Insecurity: No Food Insecurity (07/30/2023)   Hunger Vital Sign    Worried About Running Out of Food in the Last Year: Never true    Ran Out of Food in the Last Year: Never true  Transportation Needs: No Transportation Needs (07/30/2023)   PRAPARE - Administrator, Civil Service (Medical): No    Lack of Transportation (Non-Medical): No  Physical Activity: Insufficiently Active (07/30/2023)   Exercise Vital Sign    Days of Exercise per Week: 3 days    Minutes of Exercise per Session: 20 min  Stress: No Stress Concern Present (07/30/2023)   [employer] of Occupational Health - Occupational Stress Questionnaire    Feeling of Stress: Not at all  Social Connections: Moderately Integrated (07/30/2023)   Social Connection and Isolation Panel    Frequency of Communication with Friends and Family: Twice a week    Frequency of Social Gatherings with Friends and Family: Once a week    Attends Religious Services: Never    Database administrator or Organizations: Yes    Attends Banker Meetings: 1 to 4 times per year    Marital Status: Married  Catering manager Violence:  Not At Risk (11/09/2022)   Humiliation, Afraid, Rape, and Kick questionnaire    Fear of Current or Ex-Partner: No    Emotionally Abused: No    Physically Abused: No    Sexually Abused: No    Outpatient Medications Prior to Visit  Medication Sig Dispense Refill   Alpha Lipoic Acid 200 MG CAPS Take 200 mg by mouth daily.      aspirin  EC 81 MG tablet Take 81 mg by mouth daily.     chlorthalidone  (HYGROTON ) 25 MG tablet TAKE (1/2) TABLET BY MOUTH ONCE DAILY. 45 tablet 2   Coenzyme Q10 (CO Q-10) 100 MG CAPS Take 100 mg by mouth daily.      ezetimibe  (ZETIA ) 10 MG tablet TAKE ONE TABLET  BY MOUTH EVERY DAY 90 tablet 3   Green Tea 150 MG CAPS Take 150 mg by mouth daily.      lisinopril  (ZESTRIL ) 20 MG tablet Take 1 tablet (20 mg total) by mouth daily. 90 tablet 3   metoprolol  succinate (TOPROL -XL) 25 MG 24 hr tablet TAKE 1 TABLET BY MOUTH ONCE DAILY. 90 tablet 3   nitroGLYCERIN  (NITROSTAT ) 0.4 MG SL tablet PLACE 1 TAB UNDER TONGUE EVERY 5 MIN IF NEEDED FOR CHEST PAIN. MAY USE 3 TIMES.NO RELIEF CALL 911. 25 tablet 3   rosuvastatin  (CRESTOR ) 40 MG tablet Take 1 tablet (40 mg total) by mouth daily. 90 tablet 3   sildenafil  (VIAGRA ) 100 MG tablet TAKE (1) TABLET BY MOUTH AS NEEDED FOR ERECTILE DYSFUNCTION. 6 tablet 0   traZODone  (DESYREL ) 50 MG tablet Take 0.5-1 tablets (25-50 mg total) by mouth at bedtime as needed for sleep. (Patient taking differently: Take 25 mg by mouth at bedtime as needed for sleep.) 30 tablet 3   Turmeric 500 MG CAPS Take 500 mg by mouth daily.      vitamin B-12 (CYANOCOBALAMIN ) 500 MCG tablet Take 1 tablet (500 mcg total) by mouth daily.     vitamin C  (ASCORBIC ACID ) 500 MG tablet Take 500 mg by mouth daily.     No facility-administered medications prior to visit.    Allergies  Allergen Reactions   Flonase  [Fluticasone  Propionate] Other (See Comments)    Nose bleeds.    ROS Review of Systems  Constitutional:  Negative for chills and fever.  HENT:  Negative for  congestion and sore throat.   Eyes:  Negative for pain and discharge.  Respiratory:  Negative for cough and shortness of breath.   Cardiovascular:  Negative for chest pain and palpitations.  Gastrointestinal:  Negative for diarrhea, nausea and vomiting.  Endocrine: Negative for polydipsia and polyuria.  Genitourinary:  Negative for dysuria and hematuria.  Musculoskeletal:  Negative for neck pain and neck stiffness.  Skin:  Positive for color change. Negative for rash.  Neurological:  Negative for dizziness, weakness, numbness and headaches.  Psychiatric/Behavioral:  Negative for agitation and behavioral problems.       Objective:    Physical Exam Vitals reviewed.  Constitutional:      General: He is not in acute distress.    Appearance: He is not diaphoretic.  HENT:     Head: Normocephalic and atraumatic.     Nose: Nose normal.     Mouth/Throat:     Mouth: Mucous membranes are moist.  Eyes:     General: No scleral icterus.    Extraocular Movements: Extraocular movements intact.  Cardiovascular:     Rate and Rhythm: Normal rate and regular rhythm.     Heart sounds: Normal heart sounds. No murmur heard. Pulmonary:     Breath sounds: Normal breath sounds. No wheezing or rales.  Musculoskeletal:     Right shoulder: No tenderness. Normal range of motion.     Right upper arm: Tenderness present.     Cervical back: Neck supple. No tenderness.     Right lower leg: No edema.     Left lower leg: No edema.  Skin:    General: Skin is warm.     Findings: No rash.  Neurological:     General: No focal deficit present.     Mental Status: He is alert and oriented to person, place, and time.     Sensory: No sensory deficit.     Motor: No weakness.  Psychiatric:        Mood and Affect: Mood normal.        Behavior: Behavior normal.     BP 118/68   Pulse 63   Ht 5' 10 (1.778 m)   Wt 222 lb 9.6 oz (101 kg)   SpO2 96%   BMI 31.94 kg/m  Wt Readings from Last 3 Encounters:   08/03/23 222 lb 9.6 oz (101 kg)  04/23/23 212 lb (96.2 kg)  02/03/23 214 lb (97.1 kg)    Lab Results  Component Value Date   TSH 1.140 08/02/2023   Lab Results  Component Value Date   WBC 7.0 08/02/2023   HGB 14.9 08/02/2023   HCT 45.8 08/02/2023   MCV 96 08/02/2023   PLT 167 08/02/2023   Lab Results  Component Value Date   NA 137 08/02/2023   K 4.5 08/02/2023   CO2 20 08/02/2023   GLUCOSE 109 (H) 08/02/2023   BUN 17 08/02/2023   CREATININE 1.23 08/02/2023   BILITOT 0.7 08/02/2023   ALKPHOS 68 08/02/2023   AST 33 08/02/2023   ALT 31 08/02/2023   PROT 6.8 08/02/2023   ALBUMIN 4.4 08/02/2023   CALCIUM  9.2 08/02/2023   ANIONGAP 11 08/19/2020   EGFR 61 08/02/2023   Lab Results  Component Value Date   CHOL 117 08/02/2023   Lab Results  Component Value Date   HDL 47 08/02/2023   Lab Results  Component Value Date   LDLCALC 52 08/02/2023   Lab Results  Component Value Date   TRIG 95 08/02/2023   Lab Results  Component Value Date   CHOLHDL 2.5 08/02/2023   Lab Results  Component Value Date   HGBA1C 6.2 (H) 08/02/2023      Assessment & Plan:   Problem List Items Addressed This Visit       Cardiovascular and Mediastinum   Coronary artery disease involving native coronary artery of native heart without angina pectoris   S/p stent placement On Aspirin , statin and Zetia  On Metoprolol  and Lisinopril  Had ischemic CM, but LVEF has improved now. Followed by Cardiology  Due to his history of CAD, HTN and obesity, he would benefit from GLP-1 agonist therapy to reduce his cardiovascular risk - started Wegovy  0.25 mg qw, plan to increase dose as tolerated      Relevant Medications   Semaglutide -Weight Management (WEGOVY ) 0.25 MG/0.5ML SOAJ   Essential hypertension - Primary   BP Readings from Last 1 Encounters:  08/03/23 118/68   Well-controlled with Lisinopril , Chlorthalidone  and Metoprolol  Counseled for compliance with the medications Advised DASH  diet and moderate exercise/walking, at least 150 mins/week        Musculoskeletal and Integument   Tendinopathy of right rotator cuff   X-ray and MRI of right shoulder reviewed Advised to avoid Aleve  as he takes aspirin  and has history of CAD, can take Tylenol  arthritis instead Completed occupational therapy Due to persistent pain, had referred to orthopedic surgery, but he prefers to avoid surgery        Meds ordered this encounter  Medications   DISCONTD: Semaglutide ,0.25 or 0.5MG /DOS, (OZEMPIC , 0.25 OR 0.5 MG/DOSE,) 2 MG/3ML SOPN    Sig: Inject 0.25 mg into the skin every 7 (seven) days for 28 days, THEN 0.5 mg every 7 (seven) days for 28 days.    Dispense:  3 mL    Refill:  1   Semaglutide -Weight Management (WEGOVY ) 0.25 MG/0.5ML SOAJ    Sig: Inject 0.25 mg into the skin every  7 (seven) days.    Dispense:  2 mL    Refill:  0    Follow-up: Return in about 2 months (around 10/04/2023) for CAD.    Suzzane MARLA Blanch, MD

## 2023-08-03 NOTE — Assessment & Plan Note (Addendum)
 X-ray and MRI of right shoulder reviewed Advised to avoid Aleve  as he takes aspirin  and has history of CAD, can take Tylenol  arthritis instead Completed occupational therapy Due to persistent pain, had referred to orthopedic surgery, but he prefers to avoid surgery

## 2023-08-03 NOTE — Patient Instructions (Addendum)
 Please start taking Ozempic  0.25 mg once weekly for 4 weeks and then 0.5 mg once weekly.  Please continue to take medications as prescribed.  Please continue to follow low carb diet and perform moderate exercise/walking at least 150 mins/week.  Please consider getting Shingrix and Tdap vaccine at local pharmacy.

## 2023-08-03 NOTE — Assessment & Plan Note (Addendum)
 S/p stent placement On Aspirin , statin and Zetia  On Metoprolol  and Lisinopril  Had ischemic CM, but LVEF has improved now. Followed by Cardiology  Due to his history of CAD, HTN and obesity, he would benefit from GLP-1 agonist therapy to reduce his cardiovascular risk - started Wegovy  0.25 mg qw, plan to increase dose as tolerated

## 2023-08-03 NOTE — Telephone Encounter (Signed)
 Pharmacy Patient Advocate Encounter  Received notification from [employer] that Prior Authorization for Wegovy  0.25MG /0.5ML auto-injectors  has been APPROVED from 08/03/2023 to 01/19/2024. Unable to obtain price due to refill too soon rejection, last fill date 08/03/2023 next available fill date08/05/2023.   PA #/Case ID/Reference #: [id number]

## 2023-08-03 NOTE — Assessment & Plan Note (Signed)
 BP Readings from Last 1 Encounters:  08/03/23 118/68   Well-controlled with Lisinopril , Chlorthalidone  and Metoprolol  Counseled for compliance with the medications Advised DASH diet and moderate exercise/walking, at least 150 mins/week

## 2023-08-03 NOTE — Telephone Encounter (Signed)
 Pharmacy Patient Advocate Encounter   Received notification from CoverMyMeds that prior authorization for Wegovy  0.25MG /0.5ML auto-injectors is required/requested.   Insurance verification completed.   The patient is insured through [employer] .   Per test claim: PA required; PA submitted to above mentioned insurance via [employer] Key/confirmation #/EOC [id number] Status is pending

## 2023-08-05 ENCOUNTER — Encounter: Payer: Self-pay | Admitting: Internal Medicine

## 2023-08-05 ENCOUNTER — Other Ambulatory Visit: Payer: Self-pay

## 2023-08-05 DIAGNOSIS — I2510 Atherosclerotic heart disease of native coronary artery without angina pectoris: Secondary | ICD-10-CM

## 2023-08-05 MED ORDER — WEGOVY 0.25 MG/0.5ML ~~LOC~~ SOAJ
0.2500 mg | SUBCUTANEOUS | 0 refills | Status: DC
Start: 2023-08-05 — End: 2023-09-06

## 2023-08-10 ENCOUNTER — Encounter: Payer: Self-pay | Admitting: Internal Medicine

## 2023-08-10 ENCOUNTER — Other Ambulatory Visit: Payer: Self-pay | Admitting: Internal Medicine

## 2023-08-10 DIAGNOSIS — H1033 Unspecified acute conjunctivitis, bilateral: Secondary | ICD-10-CM

## 2023-08-10 MED ORDER — OFLOXACIN 0.3 % OP SOLN: 1.0000 [drp] | Freq: Four times a day (QID) | OPHTHALMIC | 0 refills | Status: DC

## 2023-09-06 ENCOUNTER — Other Ambulatory Visit: Payer: Self-pay | Admitting: Internal Medicine

## 2023-09-06 ENCOUNTER — Encounter: Payer: Self-pay | Admitting: Internal Medicine

## 2023-09-06 DIAGNOSIS — I2510 Atherosclerotic heart disease of native coronary artery without angina pectoris: Secondary | ICD-10-CM

## 2023-09-06 MED ORDER — WEGOVY 0.5 MG/0.5ML ~~LOC~~ SOAJ: 0.5000 mg | SUBCUTANEOUS | 0 refills | Status: DC

## 2023-09-09 ENCOUNTER — Emergency Department (HOSPITAL_COMMUNITY)
Admission: EM | Admit: 2023-09-09 | Discharge: 2023-09-09 | Disposition: A | Discharge status: Home / Self Care | Source: Home / Self Care | Attending: Emergency Medicine | Admitting: Emergency Medicine

## 2023-09-09 ENCOUNTER — Other Ambulatory Visit: Payer: Self-pay

## 2023-09-09 ENCOUNTER — Emergency Department (HOSPITAL_COMMUNITY)

## 2023-09-09 ENCOUNTER — Encounter (HOSPITAL_COMMUNITY): Payer: Self-pay

## 2023-09-09 DIAGNOSIS — G834 Cauda equina syndrome: Secondary | ICD-10-CM | POA: Diagnosis not present

## 2023-09-09 DIAGNOSIS — M4800 Spinal stenosis, site unspecified: Secondary | ICD-10-CM | POA: Diagnosis present

## 2023-09-09 DIAGNOSIS — Z7982 Long term (current) use of aspirin: Secondary | ICD-10-CM | POA: Insufficient documentation

## 2023-09-09 DIAGNOSIS — I255 Ischemic cardiomyopathy: Secondary | ICD-10-CM | POA: Diagnosis not present

## 2023-09-09 DIAGNOSIS — Z8249 Family history of ischemic heart disease and other diseases of the circulatory system: Secondary | ICD-10-CM | POA: Diagnosis not present

## 2023-09-09 DIAGNOSIS — M48061 Spinal stenosis, lumbar region without neurogenic claudication: Secondary | ICD-10-CM | POA: Diagnosis not present

## 2023-09-09 DIAGNOSIS — Z955 Presence of coronary angioplasty implant and graft: Secondary | ICD-10-CM | POA: Diagnosis not present

## 2023-09-09 DIAGNOSIS — K5730 Diverticulosis of large intestine without perforation or abscess without bleeding: Secondary | ICD-10-CM | POA: Diagnosis not present

## 2023-09-09 DIAGNOSIS — M5116 Intervertebral disc disorders with radiculopathy, lumbar region: Secondary | ICD-10-CM | POA: Diagnosis not present

## 2023-09-09 DIAGNOSIS — I2510 Atherosclerotic heart disease of native coronary artery without angina pectoris: Secondary | ICD-10-CM | POA: Insufficient documentation

## 2023-09-09 DIAGNOSIS — R7989 Other specified abnormal findings of blood chemistry: Secondary | ICD-10-CM | POA: Diagnosis not present

## 2023-09-09 DIAGNOSIS — R339 Retention of urine, unspecified: Secondary | ICD-10-CM | POA: Diagnosis present

## 2023-09-09 DIAGNOSIS — M48062 Spinal stenosis, lumbar region with neurogenic claudication: Secondary | ICD-10-CM | POA: Diagnosis not present

## 2023-09-09 DIAGNOSIS — E876 Hypokalemia: Secondary | ICD-10-CM | POA: Diagnosis not present

## 2023-09-09 DIAGNOSIS — M5416 Radiculopathy, lumbar region: Secondary | ICD-10-CM | POA: Diagnosis not present

## 2023-09-09 DIAGNOSIS — Z87891 Personal history of nicotine dependence: Secondary | ICD-10-CM | POA: Diagnosis not present

## 2023-09-09 DIAGNOSIS — Z4789 Encounter for other orthopedic aftercare: Secondary | ICD-10-CM | POA: Diagnosis not present

## 2023-09-09 DIAGNOSIS — M47816 Spondylosis without myelopathy or radiculopathy, lumbar region: Secondary | ICD-10-CM | POA: Diagnosis not present

## 2023-09-09 DIAGNOSIS — I10 Essential (primary) hypertension: Secondary | ICD-10-CM | POA: Diagnosis not present

## 2023-09-09 DIAGNOSIS — R7303 Prediabetes: Secondary | ICD-10-CM | POA: Diagnosis present

## 2023-09-09 DIAGNOSIS — I110 Hypertensive heart disease with heart failure: Secondary | ICD-10-CM | POA: Diagnosis not present

## 2023-09-09 DIAGNOSIS — D72829 Elevated white blood cell count, unspecified: Secondary | ICD-10-CM | POA: Diagnosis not present

## 2023-09-09 DIAGNOSIS — M4726 Other spondylosis with radiculopathy, lumbar region: Secondary | ICD-10-CM | POA: Diagnosis not present

## 2023-09-09 DIAGNOSIS — E785 Hyperlipidemia, unspecified: Secondary | ICD-10-CM | POA: Diagnosis not present

## 2023-09-09 DIAGNOSIS — Z0389 Encounter for observation for other suspected diseases and conditions ruled out: Secondary | ICD-10-CM | POA: Diagnosis not present

## 2023-09-09 DIAGNOSIS — I25810 Atherosclerosis of coronary artery bypass graft(s) without angina pectoris: Secondary | ICD-10-CM | POA: Diagnosis not present

## 2023-09-09 DIAGNOSIS — I5042 Chronic combined systolic (congestive) and diastolic (congestive) heart failure: Secondary | ICD-10-CM | POA: Insufficient documentation

## 2023-09-09 DIAGNOSIS — M25362 Other instability, left knee: Secondary | ICD-10-CM | POA: Diagnosis present

## 2023-09-09 DIAGNOSIS — M1990 Unspecified osteoarthritis, unspecified site: Secondary | ICD-10-CM | POA: Diagnosis present

## 2023-09-09 DIAGNOSIS — M549 Dorsalgia, unspecified: Secondary | ICD-10-CM | POA: Diagnosis not present

## 2023-09-09 DIAGNOSIS — M2548 Effusion, other site: Secondary | ICD-10-CM | POA: Diagnosis not present

## 2023-09-09 DIAGNOSIS — Z981 Arthrodesis status: Secondary | ICD-10-CM | POA: Diagnosis not present

## 2023-09-09 DIAGNOSIS — R262 Difficulty in walking, not elsewhere classified: Secondary | ICD-10-CM | POA: Diagnosis present

## 2023-09-09 DIAGNOSIS — I252 Old myocardial infarction: Secondary | ICD-10-CM | POA: Diagnosis not present

## 2023-09-09 DIAGNOSIS — M623 Immobility syndrome (paraplegic): Secondary | ICD-10-CM | POA: Diagnosis not present

## 2023-09-09 DIAGNOSIS — M545 Low back pain, unspecified: Secondary | ICD-10-CM | POA: Diagnosis not present

## 2023-09-09 DIAGNOSIS — Z7401 Bed confinement status: Secondary | ICD-10-CM | POA: Diagnosis not present

## 2023-09-09 DIAGNOSIS — M5126 Other intervertebral disc displacement, lumbar region: Secondary | ICD-10-CM | POA: Diagnosis not present

## 2023-09-09 DIAGNOSIS — K592 Neurogenic bowel, not elsewhere classified: Secondary | ICD-10-CM | POA: Diagnosis not present

## 2023-09-09 DIAGNOSIS — Z79899 Other long term (current) drug therapy: Secondary | ICD-10-CM | POA: Insufficient documentation

## 2023-09-09 DIAGNOSIS — Z8701 Personal history of pneumonia (recurrent): Secondary | ICD-10-CM | POA: Diagnosis not present

## 2023-09-09 DIAGNOSIS — R7401 Elevation of levels of liver transaminase levels: Secondary | ICD-10-CM | POA: Diagnosis not present

## 2023-09-09 DIAGNOSIS — M25361 Other instability, right knee: Secondary | ICD-10-CM | POA: Diagnosis present

## 2023-09-09 DIAGNOSIS — F17290 Nicotine dependence, other tobacco product, uncomplicated: Secondary | ICD-10-CM | POA: Diagnosis present

## 2023-09-09 DIAGNOSIS — T380X5A Adverse effect of glucocorticoids and synthetic analogues, initial encounter: Secondary | ICD-10-CM | POA: Diagnosis not present

## 2023-09-09 MED ORDER — NAPROXEN 500 MG PO TABS: 500.0000 mg | ORAL_TABLET | Freq: Two times a day (BID) | ORAL | 0 refills | Status: DC

## 2023-09-09 MED ORDER — DEXAMETHASONE SODIUM PHOSPHATE 10 MG/ML IJ SOLN
10.0000 mg | Freq: Once | INTRAMUSCULAR | Status: AC
  Administered 2023-09-09: 10 mg via INTRAVENOUS
  Filled 2023-09-09: qty 1

## 2023-09-09 MED ORDER — OXYCODONE-ACETAMINOPHEN 5-325 MG PO TABS
1.0000 | ORAL_TABLET | Freq: Once | ORAL | Status: AC
  Administered 2023-09-09: 1 via ORAL
  Filled 2023-09-09: qty 1

## 2023-09-09 MED ORDER — METHOCARBAMOL 500 MG PO TABS: 500.0000 mg | ORAL_TABLET | Freq: Two times a day (BID) | ORAL | 0 refills | Status: DC

## 2023-09-09 NOTE — Discharge Instructions (Signed)
 You were seen today for back pain.  Your x-rays are negative for fracture but the disc space is narrowed.  Given this and your history and physical exam, I suspect that you may have nerve pain also called radicular pain.  You were given a shot of steroid.  Given that you tolerate NSAIDs, you can start naproxen  or Aleve .  Would limit the use for the next 5 to 7 days.  You will also be given short course of muscle relaxant.  This could make you sleepy.  I advise you not to drive or operate heavy machinery.

## 2023-09-09 NOTE — ED Provider Notes (Signed)
 [place] EMERGENCY DEPARTMENT AT [facility] Provider Note   CSN: [id number] Arrival date & time: 09/09/23  9857     Patient presents with: No chief complaint on file.   Austin Bright is a 78 y.o. male.   HPI     This is a 78 year old male who presents with back pain.  Patient reports 4-day history of worsening back pain.  He states that the back pain is in his mid back and radiates down the bilateral legs.  Denies any specific injury.  He did do some gardening at the end of last week but no other noted heavy lifting.  Has not noted any bowel or bladder difficulty.  Has been taking ibuprofen with minimal relief.  States the pain got unbearable tonight.  Seems to be worse with walking.  Denies numbness or tingling of the legs.  Prior to Admission medications   Medication Sig Start Date End Date Taking? Authorizing Provider  methocarbamol  (ROBAXIN ) 500 MG tablet Take 1 tablet (500 mg total) by mouth 2 (two) times daily. 09/09/23  Yes Shenica Holzheimer, Charmaine FALCON, MD  naproxen  (NAPROSYN ) 500 MG tablet Take 1 tablet (500 mg total) by mouth 2 (two) times daily. 09/09/23  Yes Monette Omara, Charmaine FALCON, MD  semaglutide -weight management (WEGOVY ) 0.5 MG/0.5ML SOAJ SQ injection Inject 0.5 mg into the skin every 7 (seven) days. 09/06/23   [name], MD  Alpha Lipoic Acid 200 MG CAPS Take 200 mg by mouth daily.     [provider]  aspirin  EC 81 MG tablet Take 81 mg by mouth daily.    [provider]  chlorthalidone  (HYGROTON ) 25 MG tablet TAKE (1/2) TABLET BY MOUTH ONCE DAILY. 05/21/23   Alvan Dorn FALCON, MD  Coenzyme Q10 (CO Q-10) 100 MG CAPS Take 100 mg by mouth daily.     [provider]  ezetimibe  (ZETIA ) 10 MG tablet TAKE ONE TABLET BY MOUTH EVERY DAY 07/19/23   Alvan Dorn FALCON, MD  Green Tea 150 MG CAPS Take 150 mg by mouth daily.     [provider]  lisinopril  (ZESTRIL ) 20 MG tablet Take 1 tablet (20 mg total) by mouth daily. 02/03/23   [name], MD  metoprolol  succinate (TOPROL -XL) 25 MG 24 hr tablet TAKE 1 TABLET BY MOUTH ONCE DAILY. 02/08/23   Alvan Dorn FALCON, MD  nitroGLYCERIN  (NITROSTAT ) 0.4 MG SL tablet PLACE 1 TAB UNDER TONGUE EVERY 5 MIN IF NEEDED FOR CHEST PAIN. MAY USE 3 TIMES.NO RELIEF CALL 911. 09/27/20   Marylu Leita SAUNDERS, NP  ofloxacin  (OCUFLOX ) 0.3 % ophthalmic solution Place 1 drop into both eyes 4 (four) times daily. 08/10/23   [name], MD  rosuvastatin  (CRESTOR ) 40 MG tablet Take 1 tablet (40 mg total) by mouth daily. 02/03/23   [name], MD  sildenafil  (VIAGRA ) 100 MG tablet TAKE (1) TABLET BY MOUTH AS NEEDED FOR ERECTILE DYSFUNCTION. 04/22/22   Alvan Dorn FALCON, MD  traZODone  (DESYREL ) 50 MG tablet Take 0.5-1 tablets (25-50 mg total) by mouth at bedtime as needed for sleep. Patient taking differently: Take 25 mg by mouth at bedtime as needed for sleep. 01/27/21   [name], MD  Turmeric 500 MG CAPS Take 500 mg by mouth daily.     [provider]  vitamin B-12 (CYANOCOBALAMIN ) 500 MCG tablet Take 1 tablet (500 mcg total) by mouth daily. 08/19/20   [name], MD  vitamin C  (ASCORBIC ACID ) 500 MG tablet Take 500  mg by mouth daily.    [provider]    Allergies: Flonase  [fluticasone  propionate]    Review of Systems  Constitutional:  Negative for fever.  Gastrointestinal:  Negative for abdominal pain.  Genitourinary:  Negative for difficulty urinating.  Musculoskeletal:  Positive for back pain.  Neurological:  Negative for weakness and numbness.  All other systems reviewed and are negative.   Updated Vital Signs BP 114/69 (BP Location: Left Arm)   Pulse 73   Temp 98 F (36.7 C) (Oral)   Resp 18   SpO2 94%   Physical Exam Vitals and nursing note reviewed.  Constitutional:      Appearance: He is well-developed. He is not ill-appearing.  HENT:     Head: Normocephalic and atraumatic.  Eyes:     Pupils: Pupils are equal, round, and reactive to light.   Cardiovascular:     Rate and Rhythm: Normal rate and regular rhythm.     Heart sounds: Normal heart sounds. No murmur heard. Pulmonary:     Effort: Pulmonary effort is normal. No respiratory distress.     Breath sounds: Normal breath sounds. No wheezing.  Abdominal:     Palpations: Abdomen is soft.     Tenderness: There is no abdominal tenderness. There is no rebound.  Musculoskeletal:     Cervical back: Neck supple.     Comments: Tenderness to palpation lower lumbar spine, no midline tenderness,, step-off, deformity, positive right straight leg raise  Lymphadenopathy:     Cervical: No cervical adenopathy.  Skin:    General: Skin is warm and dry.  Neurological:     Mental Status: He is alert and oriented to person, place, and time.     Comments: 5 out of 5 strength bilateral lower extremities, 2+ patellar reflexes bilaterally  Psychiatric:        Mood and Affect: Mood normal.     (all labs ordered are listed, but only abnormal results are displayed) Labs Reviewed - No data to display  EKG: None  Radiology: DG Lumbar Spine Complete Result Date: 09/09/2023 CLINICAL DATA:  Four days of persistent lumbar region back pain and discomfort. EXAM: LUMBAR SPINE - COMPLETE 4+ VIEW COMPARISON:  CT abdomen pelvis 08/17/2020 FINDINGS: No evidence of acute fracture or traumatic listhesis. Mild multilevel spondylosis and facet arthropathy. There is moderate disc space height loss at L2-L3. IMPRESSION: 1. No evidence of acute fracture or traumatic listhesis. 2. Moderate disc space height loss at L2-L3. Electronically Signed   By: Norman Gatlin M.D.   On: 09/09/2023 02:58     Procedures   Medications Ordered in the ED  dexamethasone  (DECADRON ) injection 10 mg (10 mg Intravenous Given 09/09/23 0225)  oxyCODONE -acetaminophen  (PERCOCET/ROXICET) 5-325 MG per tablet 1 tablet (1 tablet Oral Given 09/09/23 0225)                                    Medical Decision Making Amount and/or  Complexity of Data Reviewed Radiology: ordered.  Risk Prescription drug management.   This patient presents to the ED for concern of neck pain, this involves an extensive number of treatment options, and is a complaint that carries with it a high risk of complications and morbidity.  I considered the following differential and admission for this acute, potentially life threatening condition.  The differential diagnosis includes strain, radiculopathy, less likely fracture or epidural abscess  MDM:    This is  a 78 year old male who presents with low back pain.  Nontoxic and vital signs are reassuring.  No significant red flags with the exception of age.  No history of cancer, fever, drug use.  No signs or symptoms of cauda equina.  Patient was given a dose of Decadron  and a Percocet.  X-rays show no evidence of fracture but do show disc space narrowing and height loss at L3/L4.  Given history and physical exam, feel this likely supports a radicular cause.  Patient states he does not like the way the Percocet makes him feel.  He does take anti-inflammatories at home regularly and tolerates them.  Discussed that he needs to limit use when he can but should take scheduled NSAIDs for the next 3 to 5 days.  Will give a short course of muscle relaxant as well.  May benefit from physical therapy.  If not improving, would have him follow-up with his primary doctor for referral for advanced imaging.  Patient stated understanding.  (Labs, imaging, consults)  Labs: I Ordered, and personally interpreted labs.  The pertinent results include: None  Imaging Studies ordered: I ordered imaging studies including x-ray I independently visualized and interpreted imaging. I agree with the radiologist interpretation  Additional history obtained from chart review.  External records from outside source obtained and reviewed including prior evaluations  Cardiac Monitoring: The patient was not maintained on a cardiac  monitor.  If on the cardiac monitor, I personally viewed and interpreted the cardiac monitored which showed an underlying rhythm of: N/A  Reevaluation: After the interventions noted above, I reevaluated the patient and found that they have :stayed the same  Social Determinants of Health:  lives independently  Disposition: Discharge  Co morbidities that complicate the patient evaluation  Past Medical History:  Diagnosis Date   Acute diverticulitis 08/17/2020   Acute MI (HCC) 08/2011   Arthritis    Bilateral pneumonia    Diagnosed after STEMI 08/2011   CAD (coronary artery disease)    a. Diagnosed 08/2011 with anterior STEMI due to thrombotic occlusion of mid LAD s/p thrombectomy, PTCA, DES placement 08/23/11.   CHF (congestive heart failure) (HCC)    Chronic combined systolic and diastolic congestive heart failure (HCC) 09/15/2011   Dyslipidemia    Gout    Hypertension    Ischemic cardiomyopathy    a. Initial EF 35% by cath 08/23/11, improved to 40-45% by echo 08/25/11. 50-55% by echo November 2013.   Shortness of breath      Medicines Meds ordered this encounter  Medications   dexamethasone  (DECADRON ) injection 10 mg   oxyCODONE -acetaminophen  (PERCOCET/ROXICET) 5-325 MG per tablet 1 tablet    Refill:  0   naproxen  (NAPROSYN ) 500 MG tablet    Sig: Take 1 tablet (500 mg total) by mouth 2 (two) times daily.    Dispense:  30 tablet    Refill:  0   methocarbamol  (ROBAXIN ) 500 MG tablet    Sig: Take 1 tablet (500 mg total) by mouth 2 (two) times daily.    Dispense:  20 tablet    Refill:  0    I have reviewed the patients home medicines and have made adjustments as needed  Problem List / ED Course: Problem List Items Addressed This Visit   None Visit Diagnoses       Lumbar radiculopathy, acute    -  Primary   Relevant Medications   methocarbamol  (ROBAXIN ) 500 MG tablet  Final diagnoses:  Lumbar radiculopathy, acute    ED Discharge Orders           Ordered    naproxen  (NAPROSYN ) 500 MG tablet  2 times daily        09/09/23 0314    methocarbamol  (ROBAXIN ) 500 MG tablet  2 times daily        09/09/23 0314               Arleth Mccullar, Charmaine FALCON, MD 09/09/23 [phone]

## 2023-09-09 NOTE — ED Notes (Signed)
 Patient transported to XR.

## 2023-09-09 NOTE — ED Triage Notes (Signed)
 Pt with lower back pain x4 days, No urinary symptoms. Denies injury.

## 2023-09-10 ENCOUNTER — Other Ambulatory Visit: Payer: Self-pay | Admitting: Internal Medicine

## 2023-09-10 ENCOUNTER — Encounter: Payer: Self-pay | Admitting: Internal Medicine

## 2023-09-10 DIAGNOSIS — M51362 Other intervertebral disc degeneration, lumbar region with discogenic back pain and lower extremity pain: Secondary | ICD-10-CM

## 2023-09-10 MED ORDER — PREDNISONE 10 MG (21) PO TBPK: ORAL_TABLET | ORAL | 0 refills | Status: DC

## 2023-09-11 ENCOUNTER — Encounter (HOSPITAL_COMMUNITY)
Admission: EM | Disposition: A | Discharge status: Rehabilitation Facility | Payer: Self-pay | Source: Home / Self Care | Attending: Neurosurgery

## 2023-09-11 ENCOUNTER — Emergency Department (HOSPITAL_COMMUNITY)

## 2023-09-11 ENCOUNTER — Inpatient Hospital Stay (HOSPITAL_COMMUNITY)
Admission: EM | Admit: 2023-09-11 | Discharge: 2023-09-14 | DRG: 402 | Disposition: A | Discharge status: Rehabilitation Facility | Attending: Neurosurgery | Admitting: Neurosurgery

## 2023-09-11 ENCOUNTER — Inpatient Hospital Stay (HOSPITAL_COMMUNITY): Admitting: Anesthesiology

## 2023-09-11 ENCOUNTER — Encounter (HOSPITAL_COMMUNITY): Payer: Self-pay | Admitting: Emergency Medicine

## 2023-09-11 ENCOUNTER — Inpatient Hospital Stay (HOSPITAL_COMMUNITY)

## 2023-09-11 ENCOUNTER — Other Ambulatory Visit: Payer: Self-pay

## 2023-09-11 DIAGNOSIS — Z7401 Bed confinement status: Secondary | ICD-10-CM | POA: Diagnosis not present

## 2023-09-11 DIAGNOSIS — K592 Neurogenic bowel, not elsewhere classified: Secondary | ICD-10-CM | POA: Diagnosis not present

## 2023-09-11 DIAGNOSIS — Z791 Long term (current) use of non-steroidal anti-inflammatories (NSAID): Secondary | ICD-10-CM

## 2023-09-11 DIAGNOSIS — E785 Hyperlipidemia, unspecified: Secondary | ICD-10-CM | POA: Diagnosis present

## 2023-09-11 DIAGNOSIS — I2510 Atherosclerotic heart disease of native coronary artery without angina pectoris: Secondary | ICD-10-CM | POA: Diagnosis present

## 2023-09-11 DIAGNOSIS — D72829 Elevated white blood cell count, unspecified: Secondary | ICD-10-CM | POA: Diagnosis present

## 2023-09-11 DIAGNOSIS — M25361 Other instability, right knee: Secondary | ICD-10-CM | POA: Diagnosis present

## 2023-09-11 DIAGNOSIS — I5042 Chronic combined systolic (congestive) and diastolic (congestive) heart failure: Secondary | ICD-10-CM | POA: Diagnosis present

## 2023-09-11 DIAGNOSIS — M4800 Spinal stenosis, site unspecified: Secondary | ICD-10-CM | POA: Diagnosis present

## 2023-09-11 DIAGNOSIS — I10 Essential (primary) hypertension: Secondary | ICD-10-CM | POA: Diagnosis not present

## 2023-09-11 DIAGNOSIS — Z0389 Encounter for observation for other suspected diseases and conditions ruled out: Secondary | ICD-10-CM | POA: Diagnosis not present

## 2023-09-11 DIAGNOSIS — I25810 Atherosclerosis of coronary artery bypass graft(s) without angina pectoris: Secondary | ICD-10-CM | POA: Diagnosis not present

## 2023-09-11 DIAGNOSIS — Z79899 Other long term (current) drug therapy: Secondary | ICD-10-CM

## 2023-09-11 DIAGNOSIS — I252 Old myocardial infarction: Secondary | ICD-10-CM

## 2023-09-11 DIAGNOSIS — R262 Difficulty in walking, not elsewhere classified: Secondary | ICD-10-CM | POA: Diagnosis present

## 2023-09-11 DIAGNOSIS — R339 Retention of urine, unspecified: Secondary | ICD-10-CM | POA: Diagnosis present

## 2023-09-11 DIAGNOSIS — M48062 Spinal stenosis, lumbar region with neurogenic claudication: Secondary | ICD-10-CM | POA: Diagnosis not present

## 2023-09-11 DIAGNOSIS — Z955 Presence of coronary angioplasty implant and graft: Secondary | ICD-10-CM

## 2023-09-11 DIAGNOSIS — T380X5A Adverse effect of glucocorticoids and synthetic analogues, initial encounter: Secondary | ICD-10-CM | POA: Diagnosis present

## 2023-09-11 DIAGNOSIS — M48061 Spinal stenosis, lumbar region without neurogenic claudication: Secondary | ICD-10-CM | POA: Diagnosis not present

## 2023-09-11 DIAGNOSIS — Z888 Allergy status to other drugs, medicaments and biological substances status: Secondary | ICD-10-CM

## 2023-09-11 DIAGNOSIS — M545 Low back pain, unspecified: Secondary | ICD-10-CM | POA: Diagnosis not present

## 2023-09-11 DIAGNOSIS — Z7982 Long term (current) use of aspirin: Secondary | ICD-10-CM

## 2023-09-11 DIAGNOSIS — I110 Hypertensive heart disease with heart failure: Secondary | ICD-10-CM | POA: Diagnosis present

## 2023-09-11 DIAGNOSIS — Z8249 Family history of ischemic heart disease and other diseases of the circulatory system: Secondary | ICD-10-CM

## 2023-09-11 DIAGNOSIS — K5730 Diverticulosis of large intestine without perforation or abscess without bleeding: Secondary | ICD-10-CM | POA: Diagnosis not present

## 2023-09-11 DIAGNOSIS — Z87891 Personal history of nicotine dependence: Secondary | ICD-10-CM | POA: Diagnosis not present

## 2023-09-11 DIAGNOSIS — G834 Cauda equina syndrome: Secondary | ICD-10-CM | POA: Diagnosis not present

## 2023-09-11 DIAGNOSIS — Z7985 Long-term (current) use of injectable non-insulin antidiabetic drugs: Secondary | ICD-10-CM

## 2023-09-11 DIAGNOSIS — I255 Ischemic cardiomyopathy: Secondary | ICD-10-CM | POA: Diagnosis present

## 2023-09-11 DIAGNOSIS — M4726 Other spondylosis with radiculopathy, lumbar region: Secondary | ICD-10-CM | POA: Diagnosis present

## 2023-09-11 DIAGNOSIS — M5116 Intervertebral disc disorders with radiculopathy, lumbar region: Secondary | ICD-10-CM

## 2023-09-11 DIAGNOSIS — M1990 Unspecified osteoarthritis, unspecified site: Secondary | ICD-10-CM | POA: Diagnosis present

## 2023-09-11 DIAGNOSIS — Z8701 Personal history of pneumonia (recurrent): Secondary | ICD-10-CM

## 2023-09-11 DIAGNOSIS — E876 Hypokalemia: Secondary | ICD-10-CM | POA: Diagnosis not present

## 2023-09-11 DIAGNOSIS — F17290 Nicotine dependence, other tobacco product, uncomplicated: Secondary | ICD-10-CM | POA: Diagnosis present

## 2023-09-11 DIAGNOSIS — Z981 Arthrodesis status: Secondary | ICD-10-CM | POA: Diagnosis not present

## 2023-09-11 DIAGNOSIS — R7401 Elevation of levels of liver transaminase levels: Secondary | ICD-10-CM | POA: Diagnosis not present

## 2023-09-11 DIAGNOSIS — M25362 Other instability, left knee: Secondary | ICD-10-CM | POA: Diagnosis present

## 2023-09-11 DIAGNOSIS — M623 Immobility syndrome (paraplegic): Secondary | ICD-10-CM | POA: Diagnosis not present

## 2023-09-11 DIAGNOSIS — M549 Dorsalgia, unspecified: Secondary | ICD-10-CM | POA: Diagnosis not present

## 2023-09-11 DIAGNOSIS — R7989 Other specified abnormal findings of blood chemistry: Secondary | ICD-10-CM | POA: Diagnosis not present

## 2023-09-11 DIAGNOSIS — M2548 Effusion, other site: Secondary | ICD-10-CM | POA: Diagnosis not present

## 2023-09-11 DIAGNOSIS — R7303 Prediabetes: Secondary | ICD-10-CM | POA: Diagnosis present

## 2023-09-11 DIAGNOSIS — M5126 Other intervertebral disc displacement, lumbar region: Secondary | ICD-10-CM | POA: Diagnosis not present

## 2023-09-11 HISTORY — DX: Other specific arthropathies, not elsewhere classified, right shoulder: M12.811

## 2023-09-11 HISTORY — DX: Unspecified rotator cuff tear or rupture of right shoulder, not specified as traumatic: M75.101

## 2023-09-11 LAB — CBC WITH DIFFERENTIAL/PLATELET
Abs Immature Granulocytes: 0.03 K/uL (ref 0.00–0.07)
Basophils Absolute: 0 K/uL (ref 0.0–0.1)
Basophils Relative: 0 %
Eosinophils Absolute: 0 K/uL (ref 0.0–0.5)
Eosinophils Relative: 0 %
HCT: 48 % (ref 39.0–52.0)
Hemoglobin: 16.7 g/dL (ref 13.0–17.0)
Immature Granulocytes: 0 %
Lymphocytes Relative: 12 %
Lymphs Abs: 1.3 K/uL (ref 0.7–4.0)
MCH: 32.1 pg (ref 26.0–34.0)
MCHC: 34.8 g/dL (ref 30.0–36.0)
MCV: 92.3 fL (ref 80.0–100.0)
Monocytes Absolute: 1.1 K/uL — ABNORMAL HIGH (ref 0.1–1.0)
Monocytes Relative: 10 %
Neutro Abs: 8.1 K/uL — ABNORMAL HIGH (ref 1.7–7.7)
Neutrophils Relative %: 78 %
Platelets: 182 K/uL (ref 150–400)
RBC: 5.2 MIL/uL (ref 4.22–5.81)
RDW: 13.4 % (ref 11.5–15.5)
WBC: 10.6 K/uL — ABNORMAL HIGH (ref 4.0–10.5)
nRBC: 0 % (ref 0.0–0.2)

## 2023-09-11 LAB — COMPREHENSIVE METABOLIC PANEL WITH GFR
ALT: 65 U/L — ABNORMAL HIGH (ref 0–44)
AST: 90 U/L — ABNORMAL HIGH (ref 15–41)
Albumin: 4.5 g/dL (ref 3.5–5.0)
Alkaline Phosphatase: 62 U/L (ref 38–126)
Anion gap: 17 — ABNORMAL HIGH (ref 5–15)
BUN: 34 mg/dL — ABNORMAL HIGH (ref 8–23)
CO2: 27 mmol/L (ref 22–32)
Calcium: 9.9 mg/dL (ref 8.9–10.3)
Chloride: 95 mmol/L — ABNORMAL LOW (ref 98–111)
Creatinine, Ser: 1.23 mg/dL (ref 0.61–1.24)
GFR, Estimated: >60 mL/min (ref >60)
Glucose, Bld: 136 mg/dL — ABNORMAL HIGH (ref 70–99)
Potassium: 3.5 mmol/L (ref 3.5–5.1)
Sodium: 139 mmol/L (ref 135–145)
Total Bilirubin: 1.6 mg/dL — ABNORMAL HIGH (ref 0.0–1.2)
Total Protein: 7.5 g/dL (ref 6.5–8.1)

## 2023-09-11 LAB — PROTIME-INR
INR: 1 (ref 0.8–1.2)
Prothrombin Time: 13.3 s (ref 11.4–15.2)

## 2023-09-11 LAB — SURGICAL PCR SCREEN
MRSA, PCR: NEGATIVE
Staphylococcus aureus: POSITIVE — AB

## 2023-09-11 LAB — APTT: aPTT: 26 s (ref 24–36)

## 2023-09-11 LAB — TYPE AND SCREEN
ABO/RH(D): O POS
Antibody Screen: NEGATIVE

## 2023-09-11 LAB — ABO/RH: ABO/RH(D): O POS

## 2023-09-11 SURGERY — POSTERIOR LUMBAR FUSION 1 LEVEL
Anesthesia: General | Site: Back

## 2023-09-11 MED ORDER — BACITRACIN ZINC 500 UNIT/GM EX OINT
TOPICAL_OINTMENT | CUTANEOUS | Status: DC | PRN
  Administered 2023-09-11: 1 via TOPICAL

## 2023-09-11 MED ORDER — LACTATED RINGERS IV SOLN: INTRAVENOUS | Status: DC | PRN

## 2023-09-11 MED ORDER — BUPIVACAINE-EPINEPHRINE (PF) 0.5% -1:200000 IJ SOLN
INTRAMUSCULAR | Status: DC | PRN
  Administered 2023-09-11: 10 mL

## 2023-09-11 MED ORDER — 0.9 % SODIUM CHLORIDE (POUR BTL) OPTIME
TOPICAL | Status: DC | PRN
  Administered 2023-09-11: 1000 mL

## 2023-09-11 MED ORDER — BACITRACIN ZINC 500 UNIT/GM EX OINT
TOPICAL_OINTMENT | CUTANEOUS | Status: AC
  Filled 2023-09-11: qty 28.35

## 2023-09-11 MED ORDER — LACTATED RINGERS IV SOLN: INTRAVENOUS | Status: DC

## 2023-09-11 MED ORDER — CHLORHEXIDINE GLUCONATE CLOTH 2 % EX PADS
6.0000 | MEDICATED_PAD | Freq: Every day | CUTANEOUS | Status: DC
  Administered 2023-09-12 – 2023-09-14 (×3): 6 via TOPICAL

## 2023-09-11 MED ORDER — KETOROLAC TROMETHAMINE 15 MG/ML IJ SOLN
15.0000 mg | Freq: Once | INTRAMUSCULAR | Status: AC
  Administered 2023-09-11: 15 mg via INTRAVENOUS
  Filled 2023-09-11: qty 1

## 2023-09-11 MED ORDER — PROPOFOL 10 MG/ML IV BOLUS
INTRAVENOUS | Status: AC
  Filled 2023-09-11: qty 20

## 2023-09-11 MED ORDER — CEFAZOLIN SODIUM-DEXTROSE 2-3 GM-%(50ML) IV SOLR
INTRAVENOUS | Status: DC | PRN
  Administered 2023-09-11: 2 g via INTRAVENOUS

## 2023-09-11 MED ORDER — ORAL CARE MOUTH RINSE
15.0000 mL | Freq: Once | OROMUCOSAL | Status: AC
  Administered 2023-09-11: 15 mL via OROMUCOSAL

## 2023-09-11 MED ORDER — PROPOFOL 10 MG/ML IV BOLUS
INTRAVENOUS | Status: DC | PRN
  Administered 2023-09-11: 200 mg via INTRAVENOUS

## 2023-09-11 MED ORDER — MUPIROCIN 2 % EX OINT
1.0000 | TOPICAL_OINTMENT | Freq: Two times a day (BID) | CUTANEOUS | Status: DC
  Administered 2023-09-11 – 2023-09-14 (×5): 1 via NASAL
  Filled 2023-09-11 (×2): qty 22

## 2023-09-11 MED ORDER — THROMBIN 20000 UNITS EX SOLR
CUTANEOUS | Status: AC
  Filled 2023-09-11: qty 20000

## 2023-09-11 MED ORDER — VASHE WOUND IRRIGATION OPTIME
TOPICAL | Status: DC | PRN
  Administered 2023-09-11: 34 [oz_av] via TOPICAL

## 2023-09-11 MED ORDER — HYDROMORPHONE HCL 1 MG/ML IJ SOLN
0.5000 mg | Freq: Once | INTRAMUSCULAR | Status: AC
  Administered 2023-09-11: 0.5 mg via INTRAVENOUS
  Filled 2023-09-11: qty 0.5

## 2023-09-11 MED ORDER — SUCCINYLCHOLINE CHLORIDE 20 MG/ML IJ SOLN
INTRAMUSCULAR | Status: DC | PRN
  Administered 2023-09-11: 100 mg via INTRAVENOUS

## 2023-09-11 MED ORDER — THROMBIN 5000 UNITS EX SOLR: OROMUCOSAL | Status: DC | PRN

## 2023-09-11 MED ORDER — ROCURONIUM BROMIDE 100 MG/10ML IV SOLN
INTRAVENOUS | Status: DC | PRN
  Administered 2023-09-11 (×2): 20 mg via INTRAVENOUS
  Administered 2023-09-11: 80 mg via INTRAVENOUS

## 2023-09-11 MED ORDER — FENTANYL CITRATE (PF) 250 MCG/5ML IJ SOLN
INTRAMUSCULAR | Status: AC
  Filled 2023-09-11: qty 5

## 2023-09-11 MED ORDER — ONDANSETRON HCL 4 MG/2ML IJ SOLN: 4.0000 mg | Freq: Once | INTRAMUSCULAR | Status: DC | PRN

## 2023-09-11 MED ORDER — PHENYLEPHRINE HCL-NACL 20-0.9 MG/250ML-% IV SOLN
INTRAVENOUS | Status: DC | PRN
Start: 2023-09-11 — End: 2023-09-11
  Administered 2023-09-11: 50 ug/min via INTRAVENOUS

## 2023-09-11 MED ORDER — ACETAMINOPHEN 10 MG/ML IV SOLN: 1000.0000 mg | Freq: Once | INTRAVENOUS | Status: DC | PRN

## 2023-09-11 MED ORDER — ONDANSETRON HCL 4 MG/2ML IJ SOLN
INTRAMUSCULAR | Status: DC | PRN
  Administered 2023-09-11: 4 mg via INTRAVENOUS

## 2023-09-11 MED ORDER — HYDROMORPHONE HCL 1 MG/ML IJ SOLN
1.0000 mg | Freq: Once | INTRAMUSCULAR | Status: AC
  Administered 2023-09-11: 1 mg via INTRAVENOUS
  Filled 2023-09-11: qty 1

## 2023-09-11 MED ORDER — AMISULPRIDE (ANTIEMETIC) 5 MG/2ML IV SOLN: 10.0000 mg | Freq: Once | INTRAVENOUS | Status: DC | PRN

## 2023-09-11 MED ORDER — THROMBIN 5000 UNITS EX KIT
PACK | CUTANEOUS | Status: AC
Start: 2023-09-11 — End: 2023-09-11
  Filled 2023-09-11: qty 1

## 2023-09-11 MED ORDER — BUPIVACAINE LIPOSOME 1.3 % IJ SUSP
INTRAMUSCULAR | Status: DC | PRN
  Administered 2023-09-11: 20 mL

## 2023-09-11 MED ORDER — THROMBIN 20000 UNITS EX SOLR
CUTANEOUS | Status: AC
Start: 2023-09-11 — End: 2023-09-11
  Filled 2023-09-11: qty 20000

## 2023-09-11 MED ORDER — CHLORHEXIDINE GLUCONATE 0.12 % MT SOLN: 15.0000 mL | Freq: Once | OROMUCOSAL | Status: AC

## 2023-09-11 MED ORDER — FENTANYL CITRATE (PF) 100 MCG/2ML IJ SOLN
INTRAMUSCULAR | Status: DC | PRN
  Administered 2023-09-11: 25 ug via INTRAVENOUS
  Administered 2023-09-11: 50 ug via INTRAVENOUS
  Administered 2023-09-11: 100 ug via INTRAVENOUS
  Administered 2023-09-11 (×2): 50 ug via INTRAVENOUS
  Administered 2023-09-11: 25 ug via INTRAVENOUS

## 2023-09-11 MED ORDER — CEFAZOLIN SODIUM-DEXTROSE 2-4 GM/100ML-% IV SOLN
INTRAVENOUS | Status: AC
  Filled 2023-09-11: qty 100

## 2023-09-11 MED ORDER — FENTANYL CITRATE (PF) 100 MCG/2ML IJ SOLN: 25.0000 ug | INTRAMUSCULAR | Status: DC | PRN

## 2023-09-11 MED ORDER — BUPIVACAINE LIPOSOME 1.3 % IJ SUSP
INTRAMUSCULAR | Status: AC
Start: 2023-09-11 — End: 2023-09-11
  Filled 2023-09-11: qty 20

## 2023-09-11 MED ORDER — LIDOCAINE HCL (CARDIAC) PF 50 MG/5ML IV SOSY
PREFILLED_SYRINGE | INTRAVENOUS | Status: DC | PRN
  Administered 2023-09-11: 60 mg via INTRAVENOUS

## 2023-09-11 MED ORDER — DEXAMETHASONE SODIUM PHOSPHATE 10 MG/ML IJ SOLN
10.0000 mg | Freq: Every day | INTRAMUSCULAR | Status: DC
  Administered 2023-09-11 – 2023-09-12 (×2): 10 mg via INTRAVENOUS
  Filled 2023-09-11 (×4): qty 1

## 2023-09-11 MED ORDER — THROMBIN 20000 UNITS EX SOLR: CUTANEOUS | Status: DC | PRN

## 2023-09-11 MED ORDER — SUGAMMADEX SODIUM 200 MG/2ML IV SOLN
INTRAVENOUS | Status: DC | PRN
Start: 2023-09-11 — End: 2023-09-11
  Administered 2023-09-11: 400 mg via INTRAVENOUS

## 2023-09-11 MED ORDER — BUPIVACAINE-EPINEPHRINE (PF) 0.5% -1:200000 IJ SOLN
INTRAMUSCULAR | Status: AC
  Filled 2023-09-11: qty 30

## 2023-09-11 SURGICAL SUPPLY — 65 items
BAG COUNTER SPONGE SURGICOUNT (BAG) ×1 IMPLANT
BASKET BONE COLLECTION (BASKET) ×1 IMPLANT
BENZOIN TINCTURE PRP APPL 2/3 (GAUZE/BANDAGES/DRESSINGS) ×1 IMPLANT
BLADE CLIPPER SURG (BLADE) IMPLANT
BLADE SURG 15 STRL SS SAFETY (BLADE) IMPLANT
BUR MATCHSTICK NEURO 3.0 LAGG (BURR) ×1 IMPLANT
BUR PRECISION FLUTE 6.0 (BURR) ×1 IMPLANT
CAGE ALTERA 10X31X10-14 15D (Cage) IMPLANT
CANISTER SUCTION 3000ML PPV (SUCTIONS) ×1 IMPLANT
CAP LOCK DLX THRD (Cap) IMPLANT
CLEANSER WND VASHE INSTL 34OZ (WOUND CARE) ×1 IMPLANT
CNTNR URN SCR LID CUP LEK RST (MISCELLANEOUS) ×1 IMPLANT
COVER BACK TABLE 60X90IN (DRAPES) ×1 IMPLANT
DRAPE C-ARM 42X72 X-RAY (DRAPES) ×2 IMPLANT
DRAPE C-ARMOR (DRAPES) IMPLANT
DRAPE HALF SHEET 40X57 (DRAPES) ×1 IMPLANT
DRAPE LAPAROTOMY 100X72X124 (DRAPES) ×1 IMPLANT
DRAPE SURG 17X23 STRL (DRAPES) ×1 IMPLANT
DRSG OPSITE POSTOP 4X6 (GAUZE/BANDAGES/DRESSINGS) ×1 IMPLANT
ELECTRODE BLDE 4.0 EZ CLN MEGD (MISCELLANEOUS) ×1 IMPLANT
ELECTRODE REM PT RTRN 9FT ADLT (ELECTROSURGICAL) ×1 IMPLANT
EVACUATOR 1/8 PVC DRAIN (DRAIN) ×1 IMPLANT
GAUZE 4X4 16PLY ~~LOC~~+RFID DBL (SPONGE) ×1 IMPLANT
GLOVE BIO SURGEON STRL SZ 6 (GLOVE) ×1 IMPLANT
GLOVE BIO SURGEON STRL SZ 6.5 (GLOVE) IMPLANT
GLOVE BIO SURGEON STRL SZ7 (GLOVE) IMPLANT
GLOVE BIO SURGEON STRL SZ8 (GLOVE) ×2 IMPLANT
GLOVE BIO SURGEON STRL SZ8.5 (GLOVE) ×2 IMPLANT
GLOVE BIOGEL PI IND STRL 6.5 (GLOVE) ×1 IMPLANT
GLOVE BIOGEL PI IND STRL 7.5 (GLOVE) IMPLANT
GLOVE EXAM NITRILE XL STR (GLOVE) IMPLANT
GOWN STRL REUS W/ TWL LRG LVL3 (GOWN DISPOSABLE) ×1 IMPLANT
GOWN STRL REUS W/ TWL XL LVL3 (GOWN DISPOSABLE) ×2 IMPLANT
GOWN STRL REUS W/TWL 2XL LVL3 (GOWN DISPOSABLE) IMPLANT
HEMOSTAT POWDER KIT SURGIFOAM (HEMOSTASIS) ×1 IMPLANT
KIT BASIN OR (CUSTOM PROCEDURE TRAY) ×1 IMPLANT
KIT GRAFTMAG DEL NEURO DISP (NEUROSURGERY SUPPLIES) IMPLANT
KIT POSITIONER JACKSON TABLE (MISCELLANEOUS) ×1 IMPLANT
KIT TURNOVER KIT B (KITS) ×1 IMPLANT
NDL HYPO 21X1.5 SAFETY (NEEDLE) ×1 IMPLANT
NDL HYPO 22X1.5 SAFETY MO (MISCELLANEOUS) ×1 IMPLANT
NEEDLE HYPO 21X1.5 SAFETY (NEEDLE) ×1 IMPLANT
NEEDLE HYPO 22X1.5 SAFETY MO (MISCELLANEOUS) ×1 IMPLANT
NS IRRIG 1000ML POUR BTL (IV SOLUTION) ×1 IMPLANT
PACK LAMINECTOMY NEURO (CUSTOM PROCEDURE TRAY) ×1 IMPLANT
PAD ARMBOARD POSITIONER FOAM (MISCELLANEOUS) ×3 IMPLANT
PATTIES SURGICAL .5 X.5 (GAUZE/BANDAGES/DRESSINGS) IMPLANT
PATTIES SURGICAL .5 X1 (DISPOSABLE) IMPLANT
PATTIES SURGICAL 1X1 (DISPOSABLE) IMPLANT
PUTTY DBM 10CC CALC GRAN (Putty) IMPLANT
PUTTY DBM 5CC CALC GRAN (Putty) IMPLANT
ROD CURVED TI 6.35X45 (Rod) IMPLANT
SCREW PA DLX CREO 7.5X75 (Screw) IMPLANT
SPIKE FLUID TRANSFER (MISCELLANEOUS) ×1 IMPLANT
SPONGE NEURO XRAY DETECT 1X3 (DISPOSABLE) IMPLANT
SPONGE SURGIFOAM ABS GEL 100 (HEMOSTASIS) IMPLANT
SPONGE T-LAP 4X18 ~~LOC~~+RFID (SPONGE) IMPLANT
STRIP CLOSURE SKIN 1/2X4 (GAUZE/BANDAGES/DRESSINGS) ×1 IMPLANT
SUT VIC AB 1 CT1 18XBRD ANBCTR (SUTURE) ×2 IMPLANT
SUT VIC AB 2-0 CP2 18 (SUTURE) ×2 IMPLANT
SYR 20ML LL LF (SYRINGE) IMPLANT
TOWEL GREEN STERILE (TOWEL DISPOSABLE) ×1 IMPLANT
TOWEL GREEN STERILE FF (TOWEL DISPOSABLE) ×1 IMPLANT
TRAY FOLEY MTR SLVR 16FR STAT (SET/KITS/TRAYS/PACK) ×1 IMPLANT
WATER STERILE IRR 1000ML POUR (IV SOLUTION) ×1 IMPLANT

## 2023-09-11 NOTE — Anesthesia Postprocedure Evaluation (Signed)
 Anesthesia Post Note  Patient: Austin Bright  Procedure(s) Performed: Lumbar Four - Five Decompression, Instrumentation and Fusion (Back)     Patient location during evaluation: PACU Anesthesia Type: General Level of consciousness: awake Pain management: pain level controlled Vital Signs Assessment: post-procedure vital signs reviewed and stable Respiratory status: spontaneous breathing, nonlabored ventilation and respiratory function stable Cardiovascular status: blood pressure returned to baseline and stable Postop Assessment: no apparent nausea or vomiting Anesthetic complications: no   No notable events documented.  Last Vitals:  Vitals:   09/11/23 2150 09/11/23 2205  BP: 121/69 111/66  Pulse: 98 93  Resp: 12 20  Temp:  36.7 C  SpO2: 97% 95%    Last Pain:  Vitals:   09/11/23 2135  TempSrc:   PainSc: 0-No pain                 Juliett Eastburn P Kwane Rohl

## 2023-09-11 NOTE — ED Triage Notes (Signed)
 Pt to the ED with [facility] and continued Back pain. Pt was seen here on Thursday for the same. Pt states he has numbness in both legs.

## 2023-09-11 NOTE — H&P (Signed)
 Subjective: The patient is a 78 year old white male who had a myocardial infarction in 2013 treated with a stent.  He takes a baby aspirin  daily.  He was in his you state of health until 6 days ago when he began having back pain.  It has progressively gotten worse to the point where he cannot stand and walk because of the pain.  He perineal numbness and urinary retention this morning.  He was seen at the [facility], ER and worked up with a lumbar MRI which demonstrated severe stenosis and a central herniated disc at L4-5.  I was contacted and had the patient transferred to [facility].  I immediately came in to see the patient.  He complains of back pain with left greater leg pain numbness and tingling.  He complains of perineal anesthesia and urinary retention.    Past Medical History:  Diagnosis Date   Acute diverticulitis 08/17/2020   Acute MI (HCC) 08/2011   Arthritis    Bilateral pneumonia    Diagnosed after STEMI 08/2011   CAD (coronary artery disease)    a. Diagnosed 08/2011 with anterior STEMI due to thrombotic occlusion of mid LAD s/p thrombectomy, PTCA, DES placement 08/23/11.   CHF (congestive heart failure) (HCC)    Chronic combined systolic and diastolic congestive heart failure (HCC) 09/15/2011   Dyslipidemia    Gout    Hypertension    Ischemic cardiomyopathy    a. Initial EF 35% by cath 08/23/11, improved to 40-45% by echo 08/25/11. 50-55% by echo November 2013.   Shortness of breath     Past Surgical History:  Procedure Laterality Date   carpel tunnel Bilateral 2002   COLONOSCOPY WITH PROPOFOL  N/A 04/08/2017   Surgeon: [name], MD;  diverticulosis in sigmoid and descending colon, internal hemorrhoids, otherwise normal exam.  No recommendations to repeat due to age.   COLONOSCOPY WITH PROPOFOL  N/A 03/17/2021   Procedure: COLONOSCOPY WITH PROPOFOL ;  Surgeon: [name], DO;  Location: [facility] ENDO SUITE;  Service: Endoscopy;  Laterality: N/A;  8:00am   CORONARY STENT  PLACEMENT     CYSTECTOMY  1969   pilonidal cyst   LEFT AND RIGHT HEART CATHETERIZATION WITH CORONARY ANGIOGRAM N/A 09/15/2011   Procedure: LEFT AND RIGHT HEART CATHETERIZATION WITH CORONARY ANGIOGRAM;  Surgeon: [name], MD;  Location: [facility] CATH LAB;  Service: Cardiovascular;  Laterality: N/A;   LEFT HEART CATH N/A 08/23/2011   Procedure: LEFT HEART CATH;  Surgeon: [name], MD;  Location: [facility] CATH LAB;  Service: Cardiovascular;  Laterality: N/A;   NO PAST SURGERIES     PERCUTANEOUS CORONARY STENT INTERVENTION (PCI-S)  08/23/2011   Procedure: PERCUTANEOUS CORONARY STENT INTERVENTION (PCI-S);  Surgeon: [name], MD;  Location: [facility] CATH LAB;  Service: Cardiovascular;;    Allergies  Allergen Reactions   Flonase  [Fluticasone  Propionate] Other (See Comments)    Nose bleeds.    Social History   Tobacco Use   Smoking status: Former    Current packs/day: 0.00    Average packs/day: 1 pack/day for 25.0 years (25.0 ttl pk-yrs)    Types: Cigarettes    Start date: 08/23/1954    Quit date: 08/23/1979    Years since quitting: 44.0   Smokeless tobacco: Never  Substance Use Topics   Alcohol use: Yes    Alcohol/week: 4.0 standard drinks of alcohol    Types: 2 Cans of beer, 2 Shots of liquor per week    Comment: 1-2 drinks on the weekends  Family History  Problem Relation Age of Onset   Stroke Mother    Hypertension Father    Colon cancer Neg Hx    Pancreatic cancer Neg Hx    Prior to Admission medications   Medication Sig Start Date End Date Taking? Authorizing Provider  semaglutide -weight management (WEGOVY ) 0.5 MG/0.5ML SOAJ SQ injection Inject 0.5 mg into the skin every 7 (seven) days. 09/06/23   [name], MD  Alpha Lipoic Acid 200 MG CAPS Take 200 mg by mouth daily.     [provider]  aspirin  EC 81 MG tablet Take 81 mg by mouth daily.    [provider]  chlorthalidone  (HYGROTON ) 25 MG tablet TAKE (1/2) TABLET BY MOUTH ONCE DAILY. 05/21/23    Alvan Dorn FALCON, MD  Coenzyme Q10 (CO Q-10) 100 MG CAPS Take 100 mg by mouth daily.     [provider]  ezetimibe  (ZETIA ) 10 MG tablet TAKE ONE TABLET BY MOUTH EVERY DAY 07/19/23   Alvan Dorn FALCON, MD  Green Tea 150 MG CAPS Take 150 mg by mouth daily.     [provider]  lisinopril  (ZESTRIL ) 20 MG tablet Take 1 tablet (20 mg total) by mouth daily. 02/03/23   [name], MD  methocarbamol  (ROBAXIN ) 500 MG tablet Take 1 tablet (500 mg total) by mouth 2 (two) times daily. 09/09/23   Horton, Charmaine FALCON, MD  metoprolol  succinate (TOPROL -XL) 25 MG 24 hr tablet TAKE 1 TABLET BY MOUTH ONCE DAILY. 02/08/23   Alvan Dorn FALCON, MD  naproxen  (NAPROSYN ) 500 MG tablet Take 1 tablet (500 mg total) by mouth 2 (two) times daily. 09/09/23   Horton, Charmaine FALCON, MD  nitroGLYCERIN  (NITROSTAT ) 0.4 MG SL tablet PLACE 1 TAB UNDER TONGUE EVERY 5 MIN IF NEEDED FOR CHEST PAIN. MAY USE 3 TIMES.NO RELIEF CALL 911. 09/27/20   Marylu Leita SAUNDERS, NP  ofloxacin  (OCUFLOX ) 0.3 % ophthalmic solution Place 1 drop into both eyes 4 (four) times daily. 08/10/23   [name], MD  predniSONE  (STERAPRED UNI-PAK 21 TAB) 10 MG (21) TBPK tablet Take as package instructions. 09/10/23   [name], MD  rosuvastatin  (CRESTOR ) 40 MG tablet Take 1 tablet (40 mg total) by mouth daily. 02/03/23   [name], MD  sildenafil  (VIAGRA ) 100 MG tablet TAKE (1) TABLET BY MOUTH AS NEEDED FOR ERECTILE DYSFUNCTION. 04/22/22   Alvan Dorn FALCON, MD  traZODone  (DESYREL ) 50 MG tablet Take 0.5-1 tablets (25-50 mg total) by mouth at bedtime as needed for sleep. Patient taking differently: Take 25 mg by mouth at bedtime as needed for sleep. 01/27/21   [name], MD  Turmeric 500 MG CAPS Take 500 mg by mouth daily.     [provider]  vitamin B-12 (CYANOCOBALAMIN ) 500 MCG tablet Take 1 tablet (500 mcg total) by mouth daily. 08/19/20   [name], MD  vitamin C  (ASCORBIC ACID ) 500 MG tablet Take 500 mg by mouth daily.     [provider]     Review of Systems  Positive ROS:: As above  All other systems have been reviewed and were otherwise negative with the exception of those mentioned in the HPI and as above.  Objective: Vital signs in last 24 hours: Temp:  [98.2 F (36.8 C)-98.6 F (37 C)] 98.2 F (36.8 C) (08/23 1559) Pulse Rate:  [71-88] 88 (08/23 1559) Resp:  [16-20] 16 (08/23 1559) BP: (112-153)/(61-82) 153/82 (08/23 1559) SpO2:  [97 %-99 %] 97 % (08/23  1559) Weight:  [94.3 kg] 94.3 kg (08/23 1025) Estimated body mass index is 29.84 kg/m as calculated from the following:   Height as of this encounter: 5' 10 (1.778 m).   Weight as of this encounter: 94.3 kg.   General Appearance: Alert   HEENT: Normocephalic, extraocular muscles are intact  Neck: Unremarkable  Thorax: Symmetric  Abdomen: Soft  Extremities: Unremarkable  Neurologic exam: The patient is alert and oriented x 3.  Cranial nerves II through XII are examined bilaterally grossly normal.  The patient's motor strength is grossly normal in his bilateral bicep, tricep, handgrip, iliopsoas, dorsiflexors and gastrocnemius.  He has perineal anesthesia.  I reviewed the patient's lumbar MRI.  He has severe spinal stenosis at L4-5 secondary to a central herniated disc, ligamentum flavum and facet hypertrophy.  The other levels are relatively unremarkable.  Data Review Lab Results  Component Value Date   WBC 10.6 (H) 09/11/2023   HGB 16.7 09/11/2023   HCT 48.0 09/11/2023   MCV 92.3 09/11/2023   PLT 182 09/11/2023   Lab Results  Component Value Date   NA 139 09/11/2023   K 3.5 09/11/2023   CL 95 (L) 09/11/2023   CO2 27 09/11/2023   BUN 34 (H) 09/11/2023   CREATININE 1.23 09/11/2023   GLUCOSE 136 (H) 09/11/2023   Lab Results  Component Value Date   INR 1.0 09/11/2023    Assessment/Plan: Lumbar spinal stenosis: Cauda equina syndrome, lumbago, lumbar radiculopathy: I have discussed the situation with the  patient and his wife.  We have discussed the various treatment options.  I have recommended an L4-5 decompression instrumentation and fusion.  I described that surgery to them.  We have discussed the risks of surgery include the risk of anesthesia, hemorrhage, infection, spinal fluid leak, nerve injury, failure to improve his symptoms, medical risk, etc.  We also discussed the alternative of doing nothing.  I have answered all her questions.  He has decided proceed with surgery.  I posted the emergency surgery at approximately 1655.   Austin Bright 09/11/2023 4:56 PM

## 2023-09-11 NOTE — Anesthesia Procedure Notes (Signed)
 Procedure Name: Intubation Date/Time: 09/11/2023 6:18 PM  Performed by: Celia Alan HERO, CRNAPre-anesthesia Checklist: Patient identified, Emergency Drugs available, Suction available, Patient being monitored and Timeout performed Patient Re-evaluated:Patient Re-evaluated prior to induction Oxygen Delivery Method: Circle system utilized Preoxygenation: Pre-oxygenation with 100% oxygen Induction Type: IV induction and Rapid sequence Laryngoscope Size: Miller and 2 Grade View: Grade II Tube type: Oral Tube size: 7.5 mm Number of attempts: 1 Airway Equipment and Method: Stylet Placement Confirmation: ETT inserted through vocal cords under direct vision, positive ETCO2 and breath sounds checked- equal and bilateral Secured at: 24 cm Tube secured with: Tape Dental Injury: Teeth and Oropharynx as per pre-operative assessment

## 2023-09-11 NOTE — Op Note (Signed)
 Brief history: The patient is a 78 year old white male who presented to the ER complaining of back pain, leg pain and perineal numbness with urinary retention consistent with cauda equina syndrome.  He was worked up with a lumbar MRI which demonstrated severe stenosis at L4-5.  I discussed the various treatment options with him and recommended surgery.  He has decided proceed with surgery.  Preoperative diagnosis: Lumbar herniated disc, lumbar facet arthropathy, lumbar spinal stenosis compressing both the L4 and the L5 nerve roots; lumbago; lumbar radiculopathy; neurogenic claudication  Postoperative diagnosis: The same  Procedure: Bilateral L4-5 laminotomy/foraminotomies/medial facetectomy to decompress the bilateral L4 and L5 nerve roots(the work required to do this was in addition to the work required to do the posterior lumbar interbody fusion because of the patient's spinal stenosis, large central herniated disc facet arthropathy. Etc. requiring a wide decompression of the nerve roots.); left L4-5 transforaminal lumbar interbody fusion with local morselized autograft bone and Zimmer DBM; insertion of interbody prosthesis at L4-5 (globus peek expandable interbody prosthesis); posterior nonsegmental instrumentation from L4 to L5 with globus titanium pedicle screws and rods; posterior lateral arthrodesis at L4-5 with local morselized autograft bone and Zimmer DBM.  Surgeon: Dr. Chyrl Budge  Asst.: Duwaine Beck, NP  Anesthesia: Gen. endotracheal  Estimated blood loss: 200 cc  Drains: Medium Hemovac drain in the epidural space  Complications: None  Description of procedure: The patient was brought to the operating room by the anesthesia team. General endotracheal anesthesia was induced. The patient was turned to the prone position on the Wilson frame. The patient's lumbosacral region was then prepared with Betadine scrub and Betadine solution. Sterile drapes were applied.  I then injected the  area to be incised with Marcaine  with epinephrine  solution. I then used the scalpel to make a linear midline incision over the L4-5 interspace. I then used electrocautery to perform a bilateral subperiosteal dissection exposing the spinous process and lamina of L4-5. We then obtained intraoperative radiograph to confirm our location. We then inserted the Verstrac retractor to provide exposure.  I began the decompression by using the high speed drill to perform laminotomies at L4-5 bilaterally. We then used the Kerrison punches to widen the laminotomy and removed the ligamentum flavum at L4-5 bilaterally. We used the Kerrison punches to remove the medial facets at L4-5 bilaterally, we removed the left L4-5 facet. We performed wide foraminotomies about the bilateral L4 and L5 nerve roots completing the decompression.  We now turned our attention to the posterior lumbar interbody fusion. I used a scalpel to incise the intervertebral disc at L4-5 bilaterally. I then performed a partial intervertebral discectomy at L4-5 using the pituitary forceps.  As expected we encountered a large central herniated disc at L4-5.  We removed it with the pituitary forceps.  We prepared the vertebral endplates at L4-5 bilateral for the fusion by removing the soft tissues with the curettes. We then used the trial spacers to pick the appropriate sized interbody prosthesis. We prefilled his prosthesis with a combination of local morselized autograft bone that we obtained during the decompression as well as Zimmer DBM. We inserted the prefilled prosthesis into the interspace at L4-5 bilaterally, we then turned and expanded the prosthesis. There was a good snug fit of the prosthesis in the interspace. We then filled and the remainder of the intervertebral disc space with local morselized autograft bone and Zimmer DBM. This completed the posterior lumbar interbody arthrodesis.  During the decompression and insertion of the prosthesis the  assistant protected the thecal sac and nerve roots with the D'Errico retractor.  We now turned attention to the instrumentation. Under fluoroscopic guidance we cannulated the bilateral L4 and L5 pedicles with the bone probe. We then removed the bone probe. We then tapped the pedicle with a 6.5 millimeter tap. We then removed the tap. We probed inside the tapped pedicle with a ball probe to rule out cortical breaches. We then inserted a 7.5 x 55 millimeter pedicle screw into the L4 and L5 pedicles bilaterally under fluoroscopic guidance. We then palpated along the medial aspect of the pedicles to rule out cortical breaches. There were none. The nerve roots were not injured. We then connected the unilateral pedicle screws with a lordotic rod. We compressed the construct and secured the rod in place with the caps. We then tightened the caps appropriately. This completed the instrumentation from L4-5 bilaterally.  We now turned our attention to the posterior lateral arthrodesis at L4-5. We used the high-speed drill to decorticate the remainder of the facets, pars, transverse process at L4-5. We then applied a combination of local morselized autograft bone and Zimmer DBM over these decorticated posterior lateral structures. This completed the posterior lateral arthrodesis.  We then obtained hemostasis using bipolar electrocautery. We irrigated the wound out with vashe solution. We inspected the thecal sac and nerve roots and noted they were well decompressed. We then removed the retractor.  We injected Exparel  .  We placed a medium Hemovac drain in the epidural space and tunneled out through a separate stab wound.  We reapproximated patient's thoracolumbar fascia with interrupted #1 Vicryl suture. We reapproximated patient's subcutaneous tissue with interrupted 2-0 Vicryl suture. The reapproximated patient's skin with Steri-Strips and benzoin. The wound was then coated with bacitracin  ointment. A sterile dressing  was applied. The drapes were removed. The patient was subsequently returned to the supine position where they were extubated by the anesthesia team. He was then transported to the post anesthesia care unit in stable condition. All sponge instrument and needle counts were reportedly correct at the end of this case.

## 2023-09-11 NOTE — ED Provider Notes (Signed)
 Austin Bright   CSN: [id number] Arrival date & time: 09/11/23  1019     Patient presents with: Back Pain   Austin Bright is a 78 y.o. male.    Back Pain   Patient presents because of back pain.  Patient states that he started with back pain last Sunday.  Seem to be pretty mild but progressively became worse throughout the week.  Patient states that over the past day or so, he started noticing increasing pain going down bilateral legs but seems to be maybe slightly more concentrated on the right posterior leg.  Endorsing sensory changes in both legs.  Not really able to ambulate because of ongoing pain.  Endorses constipation.  He states he feels like his been able to empty his bladder.  No bowel or bladder incontinence.  Does endorse some sensory changes in his groin area.  No fever no chills.  No history of prostate cancer.  No IV drug abuse.  States that most of the pain is around his L5-S1 area.  No Falls.   Previous medical history reviewed : Patient was last seen in the ED on August 21.  Presented with back pain in that time.  Complained about radiation to bilateral legs at that time.  Was given Decadron  Dosepak as well as Percocet.     Prior to Admission medications   Medication Sig Start Date End Date Taking? Authorizing Provider  semaglutide -weight management (WEGOVY ) 0.5 MG/0.5ML SOAJ SQ injection Inject 0.5 mg into the skin every 7 (seven) days. 09/06/23   Austin Suzzane POUR, MD  Alpha Lipoic Acid 200 MG CAPS Take 200 mg by mouth daily.     [provider]  aspirin  EC 81 MG tablet Take 81 mg by mouth daily.    [provider]  chlorthalidone  (HYGROTON ) 25 MG tablet TAKE (1/2) TABLET BY MOUTH ONCE DAILY. 05/21/23   Austin Dorn FALCON, MD  Coenzyme Q10 (CO Q-10) 100 MG CAPS Take 100 mg by mouth daily.     [provider]  ezetimibe  (ZETIA ) 10 MG tablet TAKE ONE TABLET BY MOUTH EVERY DAY 07/19/23    Austin Dorn FALCON, MD  Green Tea 150 MG CAPS Take 150 mg by mouth daily.     [provider]  lisinopril  (ZESTRIL ) 20 MG tablet Take 1 tablet (20 mg total) by mouth daily. 02/03/23   Austin Suzzane POUR, MD  methocarbamol  (ROBAXIN ) 500 MG tablet Take 1 tablet (500 mg total) by mouth 2 (two) times daily. 09/09/23   Austin Bright, Austin FALCON, MD  metoprolol  succinate (TOPROL -XL) 25 MG 24 hr tablet TAKE 1 TABLET BY MOUTH ONCE DAILY. 02/08/23   Austin Dorn FALCON, MD  naproxen  (NAPROSYN ) 500 MG tablet Take 1 tablet (500 mg total) by mouth 2 (two) times daily. 09/09/23   Austin Bright, Austin FALCON, MD  nitroGLYCERIN  (NITROSTAT ) 0.4 MG SL tablet PLACE 1 TAB UNDER TONGUE EVERY 5 MIN IF NEEDED FOR CHEST PAIN. MAY USE 3 TIMES.NO RELIEF CALL 911. 09/27/20   Austin Leita SAUNDERS, NP  ofloxacin  (OCUFLOX ) 0.3 % ophthalmic solution Place 1 drop into both eyes 4 (four) times daily. 08/10/23   Austin Suzzane POUR, MD  predniSONE  (STERAPRED UNI-PAK 21 TAB) 10 MG (21) TBPK tablet Take as package instructions. 09/10/23   Austin Suzzane POUR, MD  rosuvastatin  (CRESTOR ) 40 MG tablet Take 1 tablet (40 mg total) by mouth daily. 02/03/23   Austin Suzzane POUR, MD  sildenafil  (VIAGRA ) 100 MG  tablet TAKE (1) TABLET BY MOUTH AS NEEDED FOR ERECTILE DYSFUNCTION. 04/22/22   Austin Dorn FALCON, MD  traZODone  (DESYREL ) 50 MG tablet Take 0.5-1 tablets (25-50 mg total) by mouth at bedtime as needed for sleep. Patient taking differently: Take 25 mg by mouth at bedtime as needed for sleep. 01/27/21   Austin Suzzane POUR, MD  Turmeric 500 MG CAPS Take 500 mg by mouth daily.     [provider]  vitamin B-12 (CYANOCOBALAMIN ) 500 MCG tablet Take 1 tablet (500 mcg total) by mouth daily. 08/19/20   Austin Lenis, MD  vitamin C  (ASCORBIC ACID ) 500 MG tablet Take 500 mg by mouth daily.    [provider]    Allergies: Flonase  [fluticasone  propionate]    Review of Systems  Musculoskeletal:  Positive for back pain.    Updated Vital Signs BP 131/75   Pulse 86    Temp 98.6 F (37 C) (Oral)   Resp 20   Ht 5' 10 (1.778 m)   Wt 94.3 kg   SpO2 97%   BMI 29.84 kg/m   Physical Exam Vitals and nursing Bright reviewed.  Constitutional:      General: He is not in acute distress.    Appearance: He is well-developed.  HENT:     Head: Normocephalic and atraumatic.  Eyes:     Conjunctiva/sclera: Conjunctivae normal.  Cardiovascular:     Rate and Rhythm: Normal rate and regular rhythm.     Heart sounds: No murmur heard. Pulmonary:     Effort: Pulmonary effort is normal. No respiratory distress.     Breath sounds: Normal breath sounds.  Abdominal:     Palpations: Abdomen is soft.     Tenderness: There is no abdominal tenderness.  Musculoskeletal:        General: No swelling.     Cervical back: Neck supple.     Comments: Decreased strength at the hip with flexion limited due to pain.   Equal and strong bilateral dorsi and plantar flexion.   Skin:    General: Skin is warm and dry.     Capillary Refill: Capillary refill takes less than 2 seconds.  Neurological:     Mental Status: He is alert.  Psychiatric:        Mood and Affect: Mood normal.     (all labs ordered are listed, but only abnormal results are displayed) Labs Reviewed  CBC WITH DIFFERENTIAL/PLATELET - Abnormal; Notable for the following components:      Result Value   WBC 10.6 (*)    Neutro Abs 8.1 (*)    Monocytes Absolute 1.1 (*)    All other components within normal limits  COMPREHENSIVE METABOLIC PANEL WITH GFR - Abnormal; Notable for the following components:   Chloride 95 (*)    Glucose, Bld 136 (*)    BUN 34 (*)    AST 90 (*)    ALT 65 (*)    Total Bilirubin 1.6 (*)    Anion gap 17 (*)    All other components within normal limits  PROTIME-INR  APTT    EKG: EKG Interpretation Date/Time:  Saturday September 11 2023 11:50:16 EDT Ventricular Rate:  71 PR Interval:  187 QRS Duration:  103 QT Interval:  392 QTC Calculation: 426 R Axis:   -70  Text  Interpretation: Sinus rhythm Left anterior fascicular block Anterior infarct, old Confirmed by Austin Rea [phone]) on 09/11/2023 1:00:56 PM  Radiology: MR LUMBAR SPINE WO CONTRAST Result Date: 09/11/2023 CLINICAL DATA:  78 year old male with back pain. EXAM: MRI LUMBAR SPINE WITHOUT CONTRAST TECHNIQUE: Multiplanar, multisequence MR imaging of the lumbar spine was performed. No intravenous contrast was administered. COMPARISON:  Lumbar radiographs 2 days ago. FINDINGS: Segmentation:  Normal on the comparison. Alignment: Stable lumbar lordosis. No significant scoliosis or spondylolisthesis. Vertebrae: Normal background bone marrow signal. Maintained vertebral height. Intact visible sacrum and SI joints. No marrow edema or evidence of acute osseous abnormality. Conus medullaris and cauda equina: Conus extends to the L1 level. No lower spinal cord or conus signal abnormality. Paraspinal and other soft tissues: Calcified aortic atherosclerosis better demonstrated on the comparison. Negative other Visualized abdominal viscera and paraspinal soft tissues. Distal large bowel diverticulosis in the pelvis. Disc levels: Negative for age visible lower thoracic levels through L1-L2. L2-L3: Disc space loss. Circumferential disc osteophyte complex asymmetric to the left. Epidural lipomatosis and mild to moderate posterior element hypertrophy. Moderate spinal stenosis (series 8, image 17). Mild to moderate left lateral recess stenosis. Moderate bilateral L2 foraminal stenosis. L3-L4: Severe multifactorial spinal stenosis (series 8, image 23) related to similar circumferential disc bulge asymmetric to the right, epidural lipomatosis, moderate to severe facet and ligament flavum hypertrophy. Degenerative facet joint fluid. Mild bilateral L3 neural foraminal stenosis greater on the right. L4-L5: Better maintained disc space but large central or left paracentral disc extrusion into the spinal canal resulting in severe or very  severe stenosis (series 8, image 28 and series 5, image 9). Superimposed mild epidural lipomatosis and moderate facet and ligament flavum hypertrophy. Lateral recess stenosis may be greater on the left (L5 nerve level). Mild L4 foraminal stenosis. L5-S1: Negative disc. Mild to moderate facet hypertrophy greater on the right. Trace facet joint fluid on that side. No stenosis. IMPRESSION: 1. Bulky disc extrusion at L4-L5 with very Severe spinal stenosis. Lateral recess stenosis may be greater on the left. Query L5 radiculitis. 2. Superimposed multifactorial Severe spinal stenosis also at L3-L4, and moderate spinal stenosis at L2-L3. Up to moderate bilateral L2 neural foraminal stenosis. Electronically Signed   By: VEAR Hurst M.D.   On: 09/11/2023 11:35     Procedures   Medications Ordered in the ED  dexamethasone  (DECADRON ) injection 10 mg (10 mg Intravenous Given 09/11/23 1509)  HYDROmorphone  (DILAUDID ) injection 0.5 mg (0.5 mg Intravenous Given 09/11/23 1101)  ketorolac  (TORADOL ) 15 MG/ML injection 15 mg (15 mg Intravenous Given 09/11/23 1143)  HYDROmorphone  (DILAUDID ) injection 1 mg (1 mg Intravenous Given 09/11/23 1507)    Clinical Course as of 09/11/23 1522  Sat Sep 11, 2023  1309 Dr. Mavis ( if less than 200 then can see in office on Tuesday )  [TL]    Clinical Course User Index [TL] Austin Lavonia SAILOR, MD                                 Medical Decision Making Amount and/or Complexity of Data Reviewed Labs: ordered. Radiology: ordered.  Risk Prescription drug management. Decision regarding hospitalization.    Patient presents because of back pain.  Patient states that he started with back pain last Sunday.  Seem to be pretty mild but progressively became worse throughout the week.  Patient states that over the past day or so, he started noticing increasing pain going down bilateral legs but seems to be maybe slightly more concentrated on the right posterior leg.  Endorsing sensory  changes in both legs.  Not really able to ambulate because of  ongoing pain.  Endorses constipation.  He states he feels like his been able to empty his bladder.  No bowel or bladder incontinence.  Does endorse some sensory changes in his groin area.  No fever no chills.  No history of prostate cancer.  No IV drug abuse.  States that most of the pain is around his L5-S1 area.  No Falls.  Previous medical history reviewed : Patient was last seen in the ED on August 21.  Presented with back pain in that time.  Complained about radiation to bilateral legs at that time.  Was given Decadron  Dosepak as well as Percocet.   Decreased strength at the hip with flexion limited due to pain.  Sensory changes mostly in the posterior aspect of bilateral legs.  To be more prominent on the right compared to the left.  Equal and strong bilateral dorsi and plantar flexion.    In the setting of increasing back pain, bilateral leg pain with numbness, numbness of the groin area, constipation, ordered MRI of the lumbar spine.  No concerns for any kind of spinal abscess.  No history of any kind of immunocompromise.  No IV drug abuse history.  Does not take anticoagulation that be concerning for spontaneous epidural.    MRI lumbar spine as above.  Severe stenosis.   I had ordered a post bladder void.  He was not able to void at all.  Bladder scan shows over 400 cc of urine  Foley was placed.    Spoke to Dr. Mavis from neurosurgery.  Recommend Decadron  10 mg.  Recommended transfer to [facility] for probable surgical intervention tomorrow.       Final diagnoses:  Spinal stenosis of lumbar region with neurogenic claudication    ED Discharge Orders     None          Austin Lavonia SAILOR, MD 09/11/23 [phone]

## 2023-09-11 NOTE — Plan of Care (Signed)

## 2023-09-11 NOTE — ED Notes (Signed)
 Pt has returned to the floor from MRI at this time

## 2023-09-11 NOTE — ED Notes (Signed)
 Report given to A. Kiang RN of [facility] at this time. No questions, comments, or concerns after report was given.

## 2023-09-11 NOTE — Transfer of Care (Signed)
 Immediate Anesthesia Transfer of Care Note  Patient: FARZAD TIBBETTS II  Procedure(s) Performed: Lumbar Four - Five Decompression, Instrumentation and Fusion (Back)  Patient Location: PACU  Anesthesia Type:General  Level of Consciousness: awake and alert   Airway & Oxygen Therapy: Patient Spontanous Breathing and Patient connected to nasal cannula oxygen  Post-op Assessment: Report given to RN and Post -op Vital signs reviewed and stable  Post vital signs: Reviewed and stable  Last Vitals:  Vitals Value Taken Time  BP 126/70 09/11/23 21:31  Temp    Pulse 107 09/11/23 21:37  Resp 14 09/11/23 21:37  SpO2 97 % 09/11/23 21:37  Vitals shown include unfiled device data.  Last Pain:  Vitals:   09/11/23 1732  TempSrc: Oral  PainSc: 5          Complications: No notable events documented.

## 2023-09-11 NOTE — Anesthesia Preprocedure Evaluation (Addendum)
 Anesthesia Evaluation  Patient identified by MRN, date of birth, ID band Patient awake    Reviewed: Allergy & Precautions, NPO status , Patient's Chart, lab work & pertinent test results  Airway Mallampati: II  TM Distance: >3 FB Neck ROM: Full    Dental  (+) Chipped, Dental Advisory Given,    Pulmonary former smoker   Pulmonary exam normal        Cardiovascular hypertension, Pt. on medications and Pt. on home beta blockers + CAD, + Past MI, + Cardiac Stents and +CHF  Normal cardiovascular exam     Neuro/Psych negative neurological ROS  negative psych ROS   GI/Hepatic negative GI ROS, Neg liver ROS,,,  Endo/Other  negative endocrine ROS  Patient on GLP-1 Agonist  Renal/GU negative Renal ROS     Musculoskeletal  (+) Arthritis ,    Abdominal  (+) + obese  Peds  Hematology negative hematology ROS (+)   Anesthesia Other Findings Spinal stenosis  Reproductive/Obstetrics                              Anesthesia Physical Anesthesia Plan  ASA: 3 and emergent  Anesthesia Plan: General   Post-op Pain Management:    Induction: Intravenous and Rapid sequence  PONV Risk Score and Plan: 2 and Ondansetron , Dexamethasone  and Treatment may vary due to age or medical condition  Airway Management Planned: Oral ETT  Additional Equipment:   Intra-op Plan:   Post-operative Plan: Extubation in OR  Informed Consent: I have reviewed the patients History and Physical, chart, labs and discussed the procedure including the risks, benefits and alternatives for the proposed anesthesia with the patient or authorized representative who has indicated his/her understanding and acceptance.     Dental advisory given  Plan Discussed with: CRNA  Anesthesia Plan Comments:          Anesthesia Quick Evaluation

## 2023-09-11 NOTE — ED Notes (Signed)
 Pt tried to pee using a urinal but was unable to. Pt states he feel his bladder is full but can't go. Pt's bladder scan showed around .

## 2023-09-11 NOTE — ED Notes (Signed)
 Pt taken to MRI

## 2023-09-12 DIAGNOSIS — G834 Cauda equina syndrome: Secondary | ICD-10-CM | POA: Diagnosis present

## 2023-09-12 MED ORDER — LISINOPRIL 20 MG PO TABS
20.0000 mg | ORAL_TABLET | Freq: Every day | ORAL | Status: DC
  Administered 2023-09-12 – 2023-09-14 (×3): 20 mg via ORAL
  Filled 2023-09-12 (×3): qty 1

## 2023-09-12 MED ORDER — SODIUM CHLORIDE 0.9% FLUSH
3.0000 mL | Freq: Two times a day (BID) | INTRAVENOUS | Status: DC
  Administered 2023-09-12 – 2023-09-14 (×6): 3 mL via INTRAVENOUS

## 2023-09-12 MED ORDER — ROSUVASTATIN CALCIUM 20 MG PO TABS
40.0000 mg | ORAL_TABLET | Freq: Every day | ORAL | Status: DC
  Administered 2023-09-12 – 2023-09-14 (×3): 40 mg via ORAL
  Filled 2023-09-12 (×3): qty 2

## 2023-09-12 MED ORDER — BISACODYL 10 MG RE SUPP
10.0000 mg | Freq: Every day | RECTAL | Status: DC | PRN
  Administered 2023-09-13: 10 mg via RECTAL
  Filled 2023-09-12: qty 1

## 2023-09-12 MED ORDER — DOCUSATE SODIUM 100 MG PO CAPS
100.0000 mg | ORAL_CAPSULE | Freq: Two times a day (BID) | ORAL | Status: DC
  Administered 2023-09-12 – 2023-09-14 (×6): 100 mg via ORAL
  Filled 2023-09-12 (×6): qty 1

## 2023-09-12 MED ORDER — PHENOL 1.4 % MT LIQD
1.0000 | OROMUCOSAL | Status: DC | PRN
Start: 2023-09-12 — End: 2023-09-14

## 2023-09-12 MED ORDER — ACETAMINOPHEN 650 MG RE SUPP: 650.0000 mg | RECTAL | Status: DC | PRN

## 2023-09-12 MED ORDER — OXYCODONE HCL 5 MG PO TABS: 5.0000 mg | ORAL_TABLET | ORAL | Status: DC | PRN

## 2023-09-12 MED ORDER — MORPHINE SULFATE (PF) 2 MG/ML IV SOLN: 2.0000 mg | INTRAVENOUS | Status: DC | PRN

## 2023-09-12 MED ORDER — CYCLOBENZAPRINE HCL 10 MG PO TABS: 10.0000 mg | ORAL_TABLET | Freq: Three times a day (TID) | ORAL | Status: DC | PRN

## 2023-09-12 MED ORDER — ACETAMINOPHEN 500 MG PO TABS
1000.0000 mg | ORAL_TABLET | Freq: Four times a day (QID) | ORAL | Status: AC
  Administered 2023-09-12 (×3): 1000 mg via ORAL
  Filled 2023-09-12 (×3): qty 2

## 2023-09-12 MED ORDER — SODIUM CHLORIDE 0.9% FLUSH
3.0000 mL | INTRAVENOUS | Status: DC | PRN
  Administered 2023-09-12: 3 mL via INTRAVENOUS

## 2023-09-12 MED ORDER — OFLOXACIN 0.3 % OP SOLN: 1.0000 [drp] | Freq: Four times a day (QID) | OPHTHALMIC | Status: DC

## 2023-09-12 MED ORDER — MENTHOL 3 MG MT LOZG: 1.0000 | LOZENGE | OROMUCOSAL | Status: DC | PRN

## 2023-09-12 MED ORDER — ACETAMINOPHEN 325 MG PO TABS: 650.0000 mg | ORAL_TABLET | ORAL | Status: DC | PRN

## 2023-09-12 MED ORDER — METHOCARBAMOL 500 MG PO TABS
500.0000 mg | ORAL_TABLET | Freq: Two times a day (BID) | ORAL | Status: DC
  Administered 2023-09-12 – 2023-09-14 (×6): 500 mg via ORAL
  Filled 2023-09-12 (×6): qty 1

## 2023-09-12 MED ORDER — ONDANSETRON HCL 4 MG PO TABS: 4.0000 mg | ORAL_TABLET | Freq: Four times a day (QID) | ORAL | Status: DC | PRN

## 2023-09-12 MED ORDER — METOPROLOL SUCCINATE ER 25 MG PO TB24
25.0000 mg | ORAL_TABLET | Freq: Every day | ORAL | Status: DC
  Administered 2023-09-12 – 2023-09-14 (×3): 25 mg via ORAL
  Filled 2023-09-12 (×3): qty 1

## 2023-09-12 MED ORDER — EZETIMIBE 10 MG PO TABS
10.0000 mg | ORAL_TABLET | Freq: Every day | ORAL | Status: DC
  Administered 2023-09-12 – 2023-09-14 (×3): 10 mg via ORAL
  Filled 2023-09-12 (×3): qty 1

## 2023-09-12 MED ORDER — CHLORTHALIDONE 25 MG PO TABS
25.0000 mg | ORAL_TABLET | Freq: Every day | ORAL | Status: DC
  Administered 2023-09-12 – 2023-09-14 (×3): 25 mg via ORAL
  Filled 2023-09-12 (×3): qty 1

## 2023-09-12 MED ORDER — OXYCODONE HCL 5 MG PO TABS: 10.0000 mg | ORAL_TABLET | ORAL | Status: DC | PRN

## 2023-09-12 MED ORDER — NITROGLYCERIN 0.4 MG SL SUBL: 0.4000 mg | SUBLINGUAL_TABLET | SUBLINGUAL | Status: DC | PRN

## 2023-09-12 MED ORDER — CEFAZOLIN SODIUM-DEXTROSE 2-4 GM/100ML-% IV SOLN
2.0000 g | Freq: Three times a day (TID) | INTRAVENOUS | Status: AC
  Administered 2023-09-12 (×2): 2 g via INTRAVENOUS
  Filled 2023-09-12 (×2): qty 100

## 2023-09-12 MED ORDER — ONDANSETRON HCL 4 MG/2ML IJ SOLN: 4.0000 mg | Freq: Four times a day (QID) | INTRAMUSCULAR | Status: DC | PRN

## 2023-09-12 NOTE — Progress Notes (Signed)
 Subjective: The patient is alert and pleasant.  He looks and feels better.  His wife and family members are at the bedside.  He tells me that his back and leg pain is dramatically better.  He still has perineal numbness, but this has improved as well.  His Foley catheter is still in place.  Objective: Vital signs in last 24 hours: Temp:  [97.7 F (36.5 C)-98.6 F (37 C)] 97.7 F (36.5 C) (08/24 0841) Pulse Rate:  [71-107] 83 (08/24 0841) Resp:  [12-20] 16 (08/24 0841) BP: (93-153)/(61-90) 109/67 (08/24 0841) SpO2:  [94 %-100 %] 97 % (08/24 0841) Weight:  [94.3 kg] 94.3 kg (08/23 1025) Estimated body mass index is 29.84 kg/m as calculated from the following:   Height as of this encounter: 5' 10 (1.778 m).   Weight as of this encounter: 94.3 kg.   Intake/Output from previous day: 08/23 0701 - 08/24 0700 In: 1050 [I.V.:1000; IV Piggyback:50] Out: 850 [Urine:475; Drains:175; Blood:200] Intake/Output this shift: Total I/O In: 240 [P.O.:240] Out: -   Physical exam patient is alert and pleasant.  His lower extremity strength is grossly normal.  Lab Results: Recent Labs    09/11/23 1051  WBC 10.6*  HGB 16.7  HCT 48.0  PLT 182   BMET Recent Labs    09/11/23 1051  NA 139  K 3.5  CL 95*  CO2 27  GLUCOSE 136*  BUN 34*  CREATININE 1.23  CALCIUM  9.9    Studies/Results: DG Lumbar Spine 1 View Result Date: 09/12/2023 CLINICAL DATA:  [id number] Surgery, elective [id number] EXAM: LUMBAR SPINE - 1 VIEW COMPARISON:  X-ray lumbar spine 09/11/2023 9:10 p.m., MRI lumbar spine 09/11/2023 FINDINGS: Intraoperative surgical hardware posterior to the L4 and L5 vertebral body. Atherosclerotic plaque. IMPRESSION: Surgical hardware posterior to the L4 and L5 vertebral body. Electronically Signed   By: Morgane  Naveau M.D.   On: 09/12/2023 01:02   DG Lumbar Spine 2-3 Views Result Date: 09/12/2023 CLINICAL DATA:  [id number] Surgery, elective [id number]; surgery Localization images done on portable for  L4-L5 Spinal fusion by Dr. Mavis Reason for exam: Lumbar Four - Five Decompression, Instrumentation and Fusion x-tables EXAM: LUMBAR SPINE - 2-3 VIEW; DG C-ARM 1-60 MIN-NO REPORT COMPARISON:  None Available. FINDINGS: Intraoperative L4-L5 posterolateral and interbody surgical hardware. # low resolution intraoperative spot views of the lower lumbar spine were obtained. No fracture visible on the limited views. Total fluoroscopy time: 15.4 seconds Total radiation dose: 12.1 mGy IMPRESSION: Intraoperative L4-L5 posterolateral and interbody surgical hardware. Electronically Signed   By: Morgane  Naveau M.D.   On: 09/12/2023 00:56   DG C-Arm 1-60 Min-No Report Result Date: 09/11/2023 Fluoroscopy was utilized by the requesting physician.  No radiographic interpretation.   DG C-Arm 1-60 Min-No Report Result Date: 09/11/2023 Fluoroscopy was utilized by the requesting physician.  No radiographic interpretation.   MR LUMBAR SPINE WO CONTRAST Result Date: 09/11/2023 CLINICAL DATA:  78 year old male with back pain. EXAM: MRI LUMBAR SPINE WITHOUT CONTRAST TECHNIQUE: Multiplanar, multisequence MR imaging of the lumbar spine was performed. No intravenous contrast was administered. COMPARISON:  Lumbar radiographs 2 days ago. FINDINGS: Segmentation:  Normal on the comparison. Alignment: Stable lumbar lordosis. No significant scoliosis or spondylolisthesis. Vertebrae: Normal background bone marrow signal. Maintained vertebral height. Intact visible sacrum and SI joints. No marrow edema or evidence of acute osseous abnormality. Conus medullaris and cauda equina: Conus extends to the L1 level. No lower spinal cord or conus signal abnormality. Paraspinal and other soft tissues: Calcified aortic atherosclerosis  better demonstrated on the comparison. Negative other Visualized abdominal viscera and paraspinal soft tissues. Distal large bowel diverticulosis in the pelvis. Disc levels: Negative for age visible lower thoracic  levels through L1-L2. L2-L3: Disc space loss. Circumferential disc osteophyte complex asymmetric to the left. Epidural lipomatosis and mild to moderate posterior element hypertrophy. Moderate spinal stenosis (series 8, image 17). Mild to moderate left lateral recess stenosis. Moderate bilateral L2 foraminal stenosis. L3-L4: Severe multifactorial spinal stenosis (series 8, image 23) related to similar circumferential disc bulge asymmetric to the right, epidural lipomatosis, moderate to severe facet and ligament flavum hypertrophy. Degenerative facet joint fluid. Mild bilateral L3 neural foraminal stenosis greater on the right. L4-L5: Better maintained disc space but large central or left paracentral disc extrusion into the spinal canal resulting in severe or very severe stenosis (series 8, image 28 and series 5, image 9). Superimposed mild epidural lipomatosis and moderate facet and ligament flavum hypertrophy. Lateral recess stenosis may be greater on the left (L5 nerve level). Mild L4 foraminal stenosis. L5-S1: Negative disc. Mild to moderate facet hypertrophy greater on the right. Trace facet joint fluid on that side. No stenosis. IMPRESSION: 1. Bulky disc extrusion at L4-L5 with very Severe spinal stenosis. Lateral recess stenosis may be greater on the left. Query L5 radiculitis. 2. Superimposed multifactorial Severe spinal stenosis also at L3-L4, and moderate spinal stenosis at L2-L3. Up to moderate bilateral L2 neural foraminal stenosis. Electronically Signed   By: VEAR Hurst M.D.   On: 09/11/2023 11:35    Assessment/Plan: Postop day #1: The patient is doing well.  We will mobilize him with PT.  Cauda equina syndrome: He feels much better but is still having perineal numbness.  We will plan to continue the Foley catheter until tomorrow.  Hopefully his urinary retention will resolve.  If not we will plan to send him home with a Foley catheter with urology follow-up.  I have answered all their questions.   LOS: 1 day     Austin Bright 09/12/2023, 9:09 AM     Patient ID: Austin Bright, male   DOB: 23-Apr-1945, 78 y.o.   MRN: [id number]

## 2023-09-12 NOTE — Progress Notes (Signed)
 Inpatient Rehab Admissions Coordinator Note:   Per therapy patient was screened for CIR candidacy by Netra Postlethwait SHAUNNA Yvone Cohens, CCC-SLP. At this time, pt appears to be a potential candidate for CIR. I will place an order for rehab consult for full assessment, per our protocol.  Please contact me any with questions.SABRA Tinnie Yvone Cohens, MS, CCC-SLP Admissions Coordinator [phone] 09/12/23 3:57 PM

## 2023-09-12 NOTE — Plan of Care (Signed)

## 2023-09-12 NOTE — Progress Notes (Signed)
 Orthopedic Tech Progress Note Patient Details:  Austin Bright 1945/05/26 [id number]  Ortho Devices Type of Ortho Device: Lumbar corsett Ortho Device/Splint Interventions: Ordered, Adjustment   Post Interventions Instructions Provided: Care of device, Adjustment of device  [place] A Wonda 09/12/2023, 7:48 AM

## 2023-09-12 NOTE — Evaluation (Signed)
 Physical Therapy Evaluation Patient Details Name: Austin Bright MRN: [id number] DOB: 1945/03/15 Today's Date: 09/12/2023  History of Present Illness  Pt is a 78 y.o. male admitted 8/23 with lumbar spinal stenosis, cauda equina syndrome. He emergently underwent PLIF. PMH:  MI, OA, CAD, CHF, HTN  Clinical Impression  Pt admitted with above diagnosis. PTA pt lived at home with wife, active and independent. Pt currently with functional limitations due to the deficits listed below (see PT Problem List). On eval, pt required mod assist bed mobility, min assist sit to stand from elevated surface with RW, and mod assist SPT. With fatigue, however, stedy required for second SPT. Pt reporting numbness BLE. Pt will benefit from acute skilled PT to increase their independence and safety with mobility to allow discharge. Post acute, pt would benefit from further therapy in inpatient setting > 3 hours/day.         If plan is discharge home, recommend the following: A lot of help with walking and/or transfers;A lot of help with bathing/dressing/bathroom;Assist for transportation;Assistance with cooking/housework;Help with stairs or ramp for entrance   Can travel by private vehicle        Equipment Recommendations Rolling walker (2 wheels);Wheelchair (measurements PT);Wheelchair cushion (measurements PT)  Recommendations for Other Services  Rehab consult    Functional Status Assessment Patient has had a recent decline in their functional status and demonstrates the ability to make significant improvements in function in a reasonable and predictable amount of time.     Precautions / Restrictions Precautions Precautions: Back;Fall Recall of Precautions/Restrictions: Intact Precaution/Restrictions Comments: Educated on 3/3 back precautions. Required Braces or Orthoses: Spinal Brace Spinal Brace: Lumbar corset;Applied in sitting position      Mobility  Bed Mobility Overal bed mobility: Needs  Assistance Bed Mobility: Rolling, Sidelying to Sit Rolling: Min assist Sidelying to sit: Mod assist, HOB elevated, Used rails       General bed mobility comments: increased time, cues for sequencing, assist with BLE and trunk    Transfers Overall transfer level: Needs assistance Equipment used: Rolling walker (2 wheels) Transfers: Sit to/from Stand, Bed to chair/wheelchair/BSC Sit to Stand: Min assist, From elevated surface, Via lift equipment   Step pivot transfers: Mod assist       General transfer comment: STS from elevated bed with RW. Unable to take pivot steps with RW. RW removed and face to face step pivot transfer bed to [facility] Pt reporting increased BLE numbness after sitting on BSC. Stedy transfer BSC to recliner. Min assist STS in stedy. Transfer via Lift Equipment: Stedy  Ambulation/Gait               General Gait Details: unable due to BLE weakness/numbness  Stairs            Wheelchair Mobility     Tilt Bed    Modified Rankin (Stroke Patients Only)       Balance Overall balance assessment: Needs assistance Sitting-balance support: Feet supported Sitting balance-Leahy Scale: Good     Standing balance support: Bilateral upper extremity supported, During functional activity, Reliant on assistive device for balance Standing balance-Leahy Scale: Poor                               Pertinent Vitals/Pain Pain Assessment Pain Assessment: Faces Faces Pain Scale: Hurts little more Pain Location: back Pain Descriptors / Indicators: Discomfort Pain Intervention(s): Monitored during session, Repositioned    Home Living Family/patient  expects to be discharged to:: Private residence Living Arrangements: Spouse/significant other Available Help at Discharge: Family;Available 24 hours/day (Wife will take 1 week vacation from work when pt d/c home.) Type of Home: House Home Access: Stairs to enter Entrance Stairs-Rails: None Entrance  Stairs-Number of Steps: 2 Alternate Level Stairs-Number of Steps: flight Home Layout: Two level;Bed/bath upstairs Home Equipment: Rollator (4 wheels);Cane - single point;Crutches      Prior Function Prior Level of Function : Independent/Modified Independent;Driving             Mobility Comments: active, mowing the lawn       Extremity/Trunk Assessment   Upper Extremity Assessment Upper Extremity Assessment: Overall WFL for tasks assessed    Lower Extremity Assessment Lower Extremity Assessment: Generalized weakness (pt reporting numbness bilat)    Cervical / Trunk Assessment Cervical / Trunk Assessment: Back Surgery  Communication   Communication Communication: No apparent difficulties    Cognition Arousal: Alert Behavior During Therapy: WFL for tasks assessed/performed   PT - Cognitive impairments: No apparent impairments                         Following commands: Intact       Cueing Cueing Techniques: Verbal cues     General Comments General comments (skin integrity, edema, etc.): VSS on RA    Exercises     Assessment/Plan    PT Assessment Patient needs continued PT services  PT Problem List Decreased strength;Impaired sensation;Decreased activity tolerance;Decreased knowledge of use of DME;Decreased balance;Decreased mobility;Decreased knowledge of precautions       PT Treatment Interventions DME instruction;Therapeutic exercise;Gait training;Balance training;Functional mobility training;Therapeutic activities;Patient/family education    PT Goals (Current goals can be found in the Care Plan section)  Acute Rehab PT Goals Patient Stated Goal: independence PT Goal Formulation: With patient/family Time For Goal Achievement: 09/26/23 Potential to Achieve Goals: Good    Frequency Min 3X/week     Co-evaluation               AM-PAC PT 6 Clicks Mobility  Outcome Measure Help needed turning from your back to your side while in a  flat bed without using bedrails?: A Little Help needed moving from lying on your back to sitting on the side of a flat bed without using bedrails?: A Lot Help needed moving to and from a bed to a chair (including a wheelchair)?: A Lot Help needed standing up from a chair using your arms (e.g., wheelchair or bedside chair)?: A Little Help needed to walk in hospital room?: Total Help needed climbing 3-5 steps with a railing? : Total 6 Click Score: 12    End of Session Equipment Utilized During Treatment: Gait belt;Back brace Activity Tolerance: Patient tolerated treatment well Patient left: in chair;with call bell/phone within reach;with family/visitor present Nurse Communication: Mobility status;Need for lift equipment PT Visit Diagnosis: Other abnormalities of gait and mobility (R26.89);Muscle weakness (generalized) (M62.81)    Time: 0930-1002 PT Time Calculation (min) (ACUTE ONLY): 32 min   Charges:   PT Evaluation $PT Eval Moderate Complexity: 1 Mod PT Treatments $Therapeutic Activity: 8-22 mins PT General Charges $$ ACUTE PT VISIT: 1 Visit         Sari MATSU., PT  Office # [phone]   Erven Sari Shaker 09/12/2023, 11:52 AM

## 2023-09-12 NOTE — Evaluation (Signed)
 Occupational Therapy Evaluation Patient Details Name: Austin Bright MRN: [id number] DOB: 05-24-1945 Today's Date: 09/12/2023   History of Present Illness   Pt is a 78 y.o. male admitted 8/23 with lumbar spinal stenosis, cauda equina syndrome. He emergently underwent PLIF. PMH:  MI, OA, CAD, CHF, HTN     Clinical Impressions Patient admitted for the diagnosis above.  PTA he lived at home with his spouse, and needed no assist with ADL,iADL, community mobility.  S/p PLIF, currently needing up to Mod A for simple transfers and Mod A for lower body ADL from a seated position.  Patient can stand at [facility] level, but does not have the strength to weight shift in order to take steps.  OT will follow in the acute setting to address deficits, and Patient will benefit from intensive inpatient follow-up therapy, >3 hours/day.  Patient demonstrates not only the ability to tolerate a higher intensity rehab, but could reach Mod I level prior to returning home.       If plan is discharge home, recommend the following:   Assist for transportation;Assistance with cooking/housework;A lot of help with bathing/dressing/bathroom;A lot of help with walking and/or transfers     Functional Status Assessment   Patient has had a recent decline in their functional status and demonstrates the ability to make significant improvements in function in a reasonable and predictable amount of time.     Equipment Recommendations   None recommended by OT     Recommendations for Other Services         Precautions/Restrictions   Precautions Precautions: Back;Fall Recall of Precautions/Restrictions: Intact Required Braces or Orthoses: Spinal Brace Spinal Brace: Lumbar corset;Applied in sitting position Restrictions Weight Bearing Restrictions Per Provider Order: No     Mobility Bed Mobility Overal bed mobility: Needs Assistance Bed Mobility: Sit to Sidelying         Sit to sidelying: Min assist       Transfers Overall transfer level: Needs assistance Equipment used: Ambulation equipment used Transfers: Sit to/from Stand Sit to Stand: Min assist, From elevated surface, Via lift equipment     Step pivot transfers: Mod assist       Transfer via Lift Equipment: Stedy    Balance Overall balance assessment: Needs assistance Sitting-balance support: Feet supported Sitting balance-Leahy Scale: Good     Standing balance support: Reliant on assistive device for balance Standing balance-Leahy Scale: Poor                             ADL either performed or assessed with clinical judgement   ADL Overall ADL's : Needs assistance/impaired Eating/Feeding: Independent;Sitting   Grooming: Wash/dry hands;Wash/dry face;Set up;Sitting   Upper Body Bathing: Supervision/ safety;Sitting   Lower Body Bathing: Moderate assistance;Sitting/lateral leans   Upper Body Dressing : Supervision/safety;Sitting   Lower Body Dressing: Moderate assistance;Sitting/lateral leans   Toilet Transfer: Moderate assistance;Regular Teacher, adult education Details (indicate cue type and reason): use of Stedy                 Vision Baseline Vision/History: 1 Wears glasses Patient Visual Report: No change from baseline       Perception Perception: Not tested       Praxis Praxis: Not tested       Pertinent Vitals/Pain Pain Assessment Pain Assessment: Faces Faces Pain Scale: Hurts little more Pain Location: back Pain Descriptors / Indicators: Discomfort, Aching Pain Intervention(s): Monitored during session  Extremity/Trunk Assessment Upper Extremity Assessment Upper Extremity Assessment: Overall WFL for tasks assessed;Right hand dominant;RUE deficits/detail;LUE deficits/detail RUE Deficits / Details: Chronic RCT, non operative RUE Sensation: WNL RUE Coordination: WNL LUE Sensation: WNL LUE Coordination: WNL   Lower Extremity Assessment Lower Extremity Assessment:  Defer to PT evaluation   Cervical / Trunk Assessment Cervical / Trunk Assessment: Back Surgery   Communication Communication Communication: No apparent difficulties   Cognition Arousal: Alert Behavior During Therapy: WFL for tasks assessed/performed Cognition: No apparent impairments                               Following commands: Intact       Cueing  General Comments   Cueing Techniques: Verbal cues  VSS on RA   Exercises     Shoulder Instructions      Home Living Family/patient expects to be discharged to:: Private residence Living Arrangements: Spouse/significant other Available Help at Discharge: Family;Available 24 hours/day Type of Home: House Home Access: Stairs to enter [employer] of Steps: 2 Entrance Stairs-Rails: None Home Layout: Two level;Bed/bath upstairs Alternate Level Stairs-Number of Steps: flight Alternate Level Stairs-Rails: Right     Bathroom Toilet: Standard     Home Equipment: Rollator (4 wheels);Cane - single point;Crutches          Prior Functioning/Environment Prior Level of Function : Independent/Modified Independent;Driving             Mobility Comments: active, mowing the lawn ADLs Comments: Ind    OT Problem List: Decreased strength;Decreased range of motion;Impaired balance (sitting and/or standing);Pain   OT Treatment/Interventions: Self-care/ADL training;Therapeutic activities;Patient/family education;Balance training;DME and/or AE instruction      OT Goals(Current goals can be found in the care plan section)   Acute Rehab OT Goals Patient Stated Goal: Return home OT Goal Formulation: With patient Time For Goal Achievement: 09/27/23 Potential to Achieve Goals: Good ADL Goals Pt Will Perform Grooming: with supervision;standing Pt Will Perform Upper Body Bathing: Independently;sitting Pt Will Perform Lower Body Bathing: with set-up;sitting/lateral leans Pt Will Perform Upper Body  Dressing: Independently;sitting Pt Will Perform Lower Body Dressing: with set-up;sitting/lateral leans Pt Will Transfer to Toilet: with min assist;stand pivot transfer;bedside commode   OT Frequency:  Min 2X/week    Co-evaluation              AM-PAC OT 6 Clicks Daily Activity     Outcome Measure Help from another person eating meals?: None Help from another person taking care of personal grooming?: None Help from another person toileting, which includes using toliet, bedpan, or urinal?: A Lot Help from another person bathing (including washing, rinsing, drying)?: A Lot Help from another person to put on and taking off regular upper body clothing?: A Little Help from another person to put on and taking off regular lower body clothing?: A Lot 6 Click Score: 17   End of Session Nurse Communication: Mobility status  Activity Tolerance: Patient tolerated treatment well Patient left: in bed;with call bell/phone within reach;with family/visitor present  OT Visit Diagnosis: Unsteadiness on feet (R26.81);Muscle weakness (generalized) (M62.81)                Time: 8741-8679 OT Time Calculation (min): 22 min Charges:  OT General Charges $OT Visit: 1 Visit OT Evaluation $OT Eval Moderate Complexity: 1 Mod  09/12/2023  RP, OTR/L  Acute Rehabilitation Services  Office:  [phone]   Charlie JONETTA Halsted 09/12/2023, 1:26 PM

## 2023-09-12 NOTE — Plan of Care (Signed)
  Problem: Education: Goal: Knowledge of General Education information will improve Description: Including pain rating scale, medication(s)/side effects and non-pharmacologic comfort measures Outcome: Progressing   Problem: Activity: Goal: Risk for activity intolerance will decrease Outcome: Progressing   Problem: Pain Managment: Goal: General experience of comfort will improve and/or be controlled Outcome: Progressing

## 2023-09-13 MED ORDER — FLEET ENEMA RE ENEM
1.0000 | ENEMA | Freq: Every day | RECTAL | Status: DC | PRN
  Filled 2023-09-13: qty 1

## 2023-09-13 MED ORDER — POLYETHYLENE GLYCOL 3350 17 G PO PACK
17.0000 g | PACK | Freq: Every day | ORAL | Status: DC
  Administered 2023-09-13 – 2023-09-14 (×2): 17 g via ORAL
  Filled 2023-09-13 (×2): qty 1

## 2023-09-13 MED ORDER — TAMSULOSIN HCL 0.4 MG PO CAPS
0.4000 mg | ORAL_CAPSULE | Freq: Every day | ORAL | Status: DC
  Administered 2023-09-13 – 2023-09-14 (×2): 0.4 mg via ORAL
  Filled 2023-09-13 (×2): qty 1

## 2023-09-13 NOTE — Plan of Care (Signed)

## 2023-09-13 NOTE — Progress Notes (Signed)
 IP rehab admissions - I met with patient at the bedside.  He would like CIR for a week prior to home with his wife.  His wife can take a couple weeks off work at discharge.  We have opened the case with insurance carrier and will await their decision.  Will follow up again tomorrow.  [phone]

## 2023-09-13 NOTE — Progress Notes (Signed)
 Providing Compassionate, Quality Care - Together   Subjective: Patient reports improved strength in BLE. Still has numbness in lower extremities. Reports constipation as well. Was able to ambulate with a walker this AM to the door in his room and back to the chair. Hemovac drain came out overnight.  Objective: Vital signs in last 24 hours: Temp:  [97.9 F (36.6 C)-98.2 F (36.8 C)] 97.9 F (36.6 C) (08/25 0919) Pulse Rate:  [73-83] 83 (08/25 0919) Resp:  [15-18] 18 (08/25 0919) BP: (100-121)/(60-71) 100/71 (08/25 0919) SpO2:  [96 %-100 %] 98 % (08/25 0919)  Intake/Output from previous day: 08/24 0701 - 08/25 0700 In: 1040 [P.O.:840; IV Piggyback:200] Out: 2480 [Urine:2375; Drains:105] Intake/Output this shift: Total I/O In: -  Out: 400 [Urine:400]  Alert and oriented x 4 PERRLA CN II-XII grossly intact MAE, Strength appears normal in BLE Incision is covered with Honeycomb dressing and Steri Strips; Dressing intact, with small amount of dried sanguinous drainage.   Lab Results: Recent Labs    09/11/23 1051  WBC 10.6*  HGB 16.7  HCT 48.0  PLT 182   BMET Recent Labs    09/11/23 1051  NA 139  K 3.5  CL 95*  CO2 27  GLUCOSE 136*  BUN 34*  CREATININE 1.23  CALCIUM  9.9    Studies/Results: DG Lumbar Spine 1 View Result Date: 09/12/2023 CLINICAL DATA:  [id number] Surgery, elective [id number] EXAM: LUMBAR SPINE - 1 VIEW COMPARISON:  X-ray lumbar spine 09/11/2023 9:10 p.m., MRI lumbar spine 09/11/2023 FINDINGS: Intraoperative surgical hardware posterior to the L4 and L5 vertebral body. Atherosclerotic plaque. IMPRESSION: Surgical hardware posterior to the L4 and L5 vertebral body. Electronically Signed   By: Morgane  Naveau M.D.   On: 09/12/2023 01:02   DG Lumbar Spine 2-3 Views Result Date: 09/12/2023 CLINICAL DATA:  [id number] Surgery, elective [id number]; surgery Localization images done on portable for L4-L5 Spinal fusion by Dr. Mavis Reason for exam: Lumbar Four - Five  Decompression, Instrumentation and Fusion x-tables EXAM: LUMBAR SPINE - 2-3 VIEW; DG C-ARM 1-60 MIN-NO REPORT COMPARISON:  None Available. FINDINGS: Intraoperative L4-L5 posterolateral and interbody surgical hardware. # low resolution intraoperative spot views of the lower lumbar spine were obtained. No fracture visible on the limited views. Total fluoroscopy time: 15.4 seconds Total radiation dose: 12.1 mGy IMPRESSION: Intraoperative L4-L5 posterolateral and interbody surgical hardware. Electronically Signed   By: Morgane  Naveau M.D.   On: 09/12/2023 00:56   DG C-Arm 1-60 Min-No Report Result Date: 09/11/2023 Fluoroscopy was utilized by the requesting physician.  No radiographic interpretation.   DG C-Arm 1-60 Min-No Report Result Date: 09/11/2023 Fluoroscopy was utilized by the requesting physician.  No radiographic interpretation.   MR LUMBAR SPINE WO CONTRAST Result Date: 09/11/2023 CLINICAL DATA:  78 year old male with back pain. EXAM: MRI LUMBAR SPINE WITHOUT CONTRAST TECHNIQUE: Multiplanar, multisequence MR imaging of the lumbar spine was performed. No intravenous contrast was administered. COMPARISON:  Lumbar radiographs 2 days ago. FINDINGS: Segmentation:  Normal on the comparison. Alignment: Stable lumbar lordosis. No significant scoliosis or spondylolisthesis. Vertebrae: Normal background bone marrow signal. Maintained vertebral height. Intact visible sacrum and SI joints. No marrow edema or evidence of acute osseous abnormality. Conus medullaris and cauda equina: Conus extends to the L1 level. No lower spinal cord or conus signal abnormality. Paraspinal and other soft tissues: Calcified aortic atherosclerosis better demonstrated on the comparison. Negative other Visualized abdominal viscera and paraspinal soft tissues. Distal large bowel diverticulosis in the pelvis. Disc levels: Negative for age visible  lower thoracic levels through L1-L2. L2-L3: Disc space loss. Circumferential disc  osteophyte complex asymmetric to the left. Epidural lipomatosis and mild to moderate posterior element hypertrophy. Moderate spinal stenosis (series 8, image 17). Mild to moderate left lateral recess stenosis. Moderate bilateral L2 foraminal stenosis. L3-L4: Severe multifactorial spinal stenosis (series 8, image 23) related to similar circumferential disc bulge asymmetric to the right, epidural lipomatosis, moderate to severe facet and ligament flavum hypertrophy. Degenerative facet joint fluid. Mild bilateral L3 neural foraminal stenosis greater on the right. L4-L5: Better maintained disc space but large central or left paracentral disc extrusion into the spinal canal resulting in severe or very severe stenosis (series 8, image 28 and series 5, image 9). Superimposed mild epidural lipomatosis and moderate facet and ligament flavum hypertrophy. Lateral recess stenosis may be greater on the left (L5 nerve level). Mild L4 foraminal stenosis. L5-S1: Negative disc. Mild to moderate facet hypertrophy greater on the right. Trace facet joint fluid on that side. No stenosis. IMPRESSION: 1. Bulky disc extrusion at L4-L5 with very Severe spinal stenosis. Lateral recess stenosis may be greater on the left. Query L5 radiculitis. 2. Superimposed multifactorial Severe spinal stenosis also at L3-L4, and moderate spinal stenosis at L2-L3. Up to moderate bilateral L2 neural foraminal stenosis. Electronically Signed   By: VEAR Hurst M.D.   On: 09/11/2023 11:35    Assessment/Plan: Patient with cauda equina syndrome, underwent L4-5 decompression and fusion by Dr. Mavis on 11/11/2023. Patient's symptoms are greatly improved postoperatively.   LOS: 2 days   -Will trial Foley catheter removal this afternoon. -Patient is possibly eligible for CIR at discharge. Patient would greatly benefit from acute inpatient rehabilitation. -Added scheduled Miralax  and prn Fleet enema to patient's medication list.  I am in communication with  my attending and they agree with the plan for this patient.   Gerard Beck, DNP, AGNP-C Nurse Practitioner  [facility] [address], [address], [place], [place] [address] P: [phone]    F: [phone]  09/13/2023, 10:32 AM

## 2023-09-13 NOTE — Progress Notes (Signed)
 Hemovac became dislodged overnight and came out, pt unaware. Minimal blood noted in hemovac, serosanguineous drainage to bed underneath pt. Pt states he has minimal pain and sensation is improved since surgery, though he can feel his feet, he is unable to bear much weight on them as sensation in legs remains diminished and feet are numb. Pt agreeable to colace stating it usually 'works very fast on me ', no BM overnight. Foley remains in place draining clear yellow urine.

## 2023-09-13 NOTE — Progress Notes (Signed)
 Subjective: The patient is alert and pleasant.  His back is bit more sore today.  He is interested in rehab.  Objective: Vital signs in last 24 hours: Temp:  [97.9 F (36.6 C)-98.2 F (36.8 C)] 97.9 F (36.6 C) (08/25 0919) Pulse Rate:  [73-83] 83 (08/25 0919) Resp:  [15-18] 18 (08/25 0919) BP: (100-121)/(60-71) 100/71 (08/25 0919) SpO2:  [96 %-100 %] 98 % (08/25 0919) Estimated body mass index is 29.84 kg/m as calculated from the following:   Height as of this encounter: 5' 10 (1.778 m).   Weight as of this encounter: 94.3 kg.   Intake/Output from previous day: 08/24 0701 - 08/25 0700 In: 1040 [P.O.:840; IV Piggyback:200] Out: 2480 [Urine:2375; Drains:105] Intake/Output this shift: Total I/O In: -  Out: 400 [Urine:400]  Physical exam the patient is alert and pleasant.  His lower extremity strength is grossly normal.  The Foley catheter is still in place.  Lab Results: Recent Labs    09/11/23 1051  WBC 10.6*  HGB 16.7  HCT 48.0  PLT 182   BMET Recent Labs    09/11/23 1051  NA 139  K 3.5  CL 95*  CO2 27  GLUCOSE 136*  BUN 34*  CREATININE 1.23  CALCIUM  9.9    Studies/Results: DG Lumbar Spine 1 View Result Date: 09/12/2023 CLINICAL DATA:  [id number] Surgery, elective [id number] EXAM: LUMBAR SPINE - 1 VIEW COMPARISON:  X-ray lumbar spine 09/11/2023 9:10 p.m., MRI lumbar spine 09/11/2023 FINDINGS: Intraoperative surgical hardware posterior to the L4 and L5 vertebral body. Atherosclerotic plaque. IMPRESSION: Surgical hardware posterior to the L4 and L5 vertebral body. Electronically Signed   By: Morgane  Naveau M.D.   On: 09/12/2023 01:02   DG Lumbar Spine 2-3 Views Result Date: 09/12/2023 CLINICAL DATA:  [id number] Surgery, elective [id number]; surgery Localization images done on portable for L4-L5 Spinal fusion by Dr. Mavis Reason for exam: Lumbar Four - Five Decompression, Instrumentation and Fusion x-tables EXAM: LUMBAR SPINE - 2-3 VIEW; DG C-ARM 1-60 MIN-NO REPORT  COMPARISON:  None Available. FINDINGS: Intraoperative L4-L5 posterolateral and interbody surgical hardware. # low resolution intraoperative spot views of the lower lumbar spine were obtained. No fracture visible on the limited views. Total fluoroscopy time: 15.4 seconds Total radiation dose: 12.1 mGy IMPRESSION: Intraoperative L4-L5 posterolateral and interbody surgical hardware. Electronically Signed   By: Morgane  Naveau M.D.   On: 09/12/2023 00:56   DG C-Arm 1-60 Min-No Report Result Date: 09/11/2023 Fluoroscopy was utilized by the requesting physician.  No radiographic interpretation.   DG C-Arm 1-60 Min-No Report Result Date: 09/11/2023 Fluoroscopy was utilized by the requesting physician.  No radiographic interpretation.    Assessment/Plan: Postop day #2: We will try to discontinue the Foley catheter later on today.  Rehab is assessing the patient.  I have answered all his questions.  LOS: 2 days     Reyes JONETTA Mavis 09/13/2023, 12:33 PM     Patient ID: Austin Bright, male   DOB: 02-10-1945, 78 y.o.   MRN: [id number]

## 2023-09-13 NOTE — Progress Notes (Signed)
 Mobility Specialist Progress Note:    09/13/23 1454  Mobility  Activity Ambulated with assistance  Level of Assistance Contact guard assist, steadying assist  Assistive Device Stedy  Activity Response Tolerated well  Mobility Referral Yes  Mobility visit 1 Mobility  Mobility Specialist Start Time (ACUTE ONLY) 1330  Mobility Specialist Stop Time (ACUTE ONLY) 1345  Mobility Specialist Time Calculation (min) (ACUTE ONLY) 15 min   Pt received seated EOB bed alarm going off. Pt requested assisted to the BR> requested to use the stedy d/t feeling very weak after PT session today. Pt was able to stand w/ the stedy w/ contact guard. Helped transfer to toilet. Instructed to use call light when finished. All needs met. Nursing team aware.  Thersia Minder Mobility Specialist  Please contact vis Secure Chat or  Rehab Office [phone]

## 2023-09-13 NOTE — Progress Notes (Signed)
 Physical Therapy Treatment Patient Details Name: Austin Bright MRN: [id number] DOB: 03/24/1945 Today's Date: 09/13/2023   History of Present Illness Pt is a 78 y.o. male admitted 8/23 with lumbar spinal stenosis, cauda equina syndrome. He emergently underwent PLIF. PMH:  MI, OA, CAD, CHF, HTN    PT Comments  Pt received in bed, wife present, pt reports some increased pain in back but also increased sensation in LEs which is encouraging. Pt able to come to EOB from bed flat with use of bed rail and CGA. Stedy first used for safety and strengthening before ambulation. Pt practiced multiple sit>stand and pregait activities and then stedy exchanged for RW. Pt able to step feet today and ambulate 25' before LE's fatigued and knees buckling slightly. Patient will benefit from intensive inpatient follow-up therapy, >3 hours/day. PT will continue to follow.     If plan is discharge home, recommend the following: A lot of help with walking and/or transfers;A lot of help with bathing/dressing/bathroom;Assist for transportation;Assistance with cooking/housework;Help with stairs or ramp for entrance   Can travel by private vehicle        Equipment Recommendations  Rolling walker (2 wheels);Wheelchair (measurements PT);Wheelchair cushion (measurements PT)    Recommendations for Other Services Rehab consult     Precautions / Restrictions Precautions Precautions: Back;Fall Precaution Booklet Issued: No Recall of Precautions/Restrictions: Intact Precaution/Restrictions Comments: Educated on 3/3 back precautions. Required Braces or Orthoses: Spinal Brace Spinal Brace: Lumbar corset;Applied in sitting position Restrictions Weight Bearing Restrictions Per Provider Order: No     Mobility  Bed Mobility Overal bed mobility: Needs Assistance Bed Mobility: Rolling, Sidelying to Sit Rolling: Contact guard assist Sidelying to sit: Contact guard assist       General bed mobility comments:  worked on tolerating flat bed 2 mins, then bridging knees one at a time and core activation with pelvic tilts. Pt tolerated well and then able to roll to R with use of bed rail. CGA for SL to sit but no physical assist given    Transfers Overall transfer level: Needs assistance Equipment used: Ambulation equipment used, Rolling walker (2 wheels) Transfers: Sit to/from Stand, Bed to chair/wheelchair/BSC Sit to Stand: Min assist, From elevated surface, Via lift equipment, +2 safety/equipment           General transfer comment: began with stedy for safety and to improve pt confidence. Pt able to stand with min A for power up and cues for posture. Pivoted to chair in stedy and then worked on STS to [employer] from [employer]. Needed min A for safety but had sufficient LE strength to stand Transfer via Lift Equipment: Stedy  Ambulation/Gait Ambulation/Gait assistance: Mod assist, +2 safety/equipment Gait Distance (Feet): 35 Feet (5' fwd, 5' bkwd, 25' fwd) Assistive device: Rolling walker (2 wheels) Gait Pattern/deviations: Step-through pattern, Decreased stride length Gait velocity: decreased Gait velocity interpretation: <1.31 ft/sec, indicative of household ambulator Pre-gait activities: wt shifting and then stepping General Gait Details: pt leaning heavily on RW which made bkwd stepping difficult but pt improved with practice. Pt has increased low back pain with stepping compared to yesterday but was pleased to have increased sensation in BLE's. Knees buckling slightly by end of 25'. Chair brought behind for safety   Stairs             Wheelchair Mobility     Tilt Bed    Modified Rankin (Stroke Patients Only)       Balance Overall balance assessment: Needs assistance Sitting-balance support: Feet  supported Sitting balance-Leahy Scale: Good     Standing balance support: Reliant on assistive device for balance, Bilateral upper extremity supported Standing balance-Leahy Scale:  Poor Standing balance comment: needed mod A for dynamic balance                            Communication Communication Communication: No apparent difficulties  Cognition Arousal: Alert Behavior During Therapy: WFL for tasks assessed/performed   PT - Cognitive impairments: No apparent impairments                         Following commands: Intact      Cueing Cueing Techniques: Verbal cues  Exercises General Exercises - Lower Extremity Ankle Circles/Pumps: AROM, Both, 10 reps, Seated Long Arc Quad: AROM, Both, 10 reps, Seated (with 3-5 sec hold in ext)    General Comments General comments (skin integrity, edema, etc.): VSS on RA. Wife present      Pertinent Vitals/Pain Pain Assessment Pain Assessment: Faces Faces Pain Scale: Hurts even more Pain Location: low back Pain Descriptors / Indicators: Discomfort, Aching, Operative site guarding Pain Intervention(s): Limited activity within patient's tolerance, Monitored during session    Home Living                          Prior Function            PT Goals (current goals can now be found in the care plan section) Acute Rehab PT Goals Patient Stated Goal: independence PT Goal Formulation: With patient/family Time For Goal Achievement: 09/26/23 Potential to Achieve Goals: Good Progress towards PT goals: Progressing toward goals    Frequency    Min 3X/week      PT Plan      Co-evaluation              AM-PAC PT 6 Clicks Mobility   Outcome Measure  Help needed turning from your back to your side while in a flat bed without using bedrails?: A Little Help needed moving from lying on your back to sitting on the side of a flat bed without using bedrails?: A Lot Help needed moving to and from a bed to a chair (including a wheelchair)?: A Lot Help needed standing up from a chair using your arms (e.g., wheelchair or bedside chair)?: A Little Help needed to walk in hospital room?:  Total Help needed climbing 3-5 steps with a railing? : Total 6 Click Score: 12    End of Session Equipment Utilized During Treatment: Gait belt;Back brace Activity Tolerance: Patient tolerated treatment well Patient left: in chair;with call bell/phone within reach;with family/visitor present;with chair alarm set Nurse Communication: Mobility status PT Visit Diagnosis: Other abnormalities of gait and mobility (R26.89);Muscle weakness (generalized) (M62.81)     Time: 9062-8997 PT Time Calculation (min) (ACUTE ONLY): 25 min  Charges:    $Gait Training: 8-22 mins $Therapeutic Activity: 8-22 mins PT General Charges $$ ACUTE PT VISIT: 1 Visit                     Richerd Lipoma, PT  Acute Rehab Services Secure chat preferred Office [phone]    Richerd CROME Yancy Knoble 09/13/2023, 10:51 AM

## 2023-09-13 NOTE — PMR Pre-admission (Signed)
 PMR Admission Coordinator Pre-Admission Assessment  Patient: Austin Bright is an 78 y.o., male MRN: [id number] DOB: Jun 02, 1945 Height: 5' 10 (177.8 cm) Weight: 94.3 kg  Insurance Information HMO:     PPO: Yes     PCP:       IPA:       80/20:       OTHER:  Group [id number] PRIMARY: [employer] Medicare Choice PPO      Policy#: [id number]      Subscriber: self  CM Name: [name]      Phone#: [phone] ext [phone]     Fax#: [phone] Pre-Cert#: [id number] auth for CIR from [facility] with [facility] Medicare with updates due to [name] (ext [id number]) on 9/2 at fax listed above     Employer: Retired Benefits:  Phone #: [phone]     Name: checked online at [email or web address] Eff. Date: 01/20/23     Deduct: $0      Out of Pocket Max: $4000 (met $400)      Life Max: n/a CIR: $160/day for days 1-0 max $1600/admission      SNF: 40 for days 1-20; $50/day for days 21-100 Outpatient: med nec     Co-Pay: $20/visit Home Health: 100%      Co-Pay: none DME: 80%     Co-Pay: 20% Providers: in network SECONDARY:       Policy#:      Phone#:   Artist:       Phone#:   The Data processing manager" for patients in Inpatient Rehabilitation Facilities with attached "Privacy Act Statement-Health Care Records" was provided and verbally reviewed with: Patient  Emergency Contact Information Contact Information     Name Relation Home Work Mobile   [name] Spouse   [phone]      Other Contacts     Name Relation Home Work Mobile   [name] Other [phone]  [phone]       Current Medical History  Patient Admitting Diagnosis: Cauda equina s/p PLIF  History of Present Illness: A 78 year old white male who had a myocardial infarction in 2013 treated with a stent.  He takes a baby aspirin  daily.   He was in his you state of health until 6 days ago when he began having back pain.  It has progressively gotten worse to the point where he cannot stand and walk because of the  pain.  He perineal numbness and urinary retention this morning.  He was seen at the [facility] ED on 09/09/23 and worked up with a lumbar MRI which demonstrated severe stenosis and a central herniated disc at L4-5.  Patient returned to [facility] ED on 09/11/23.  Dr. Mavis was contacted and had the patient transferred to [facility].  He offered complaints of back pain with left greater leg pain numbness and tingling.  He complained of perineal anesthesia and urinary retention. He underwent PLIF by Dr. Mavis on 09/11/23.  He continues with a foley catheter.  PT/OT evaluations were completed with recommendations for acute inpatient rehab admission.   Patient's medical record from [facility] has been reviewed by the rehabilitation admission coordinator and physician.  Past Medical History  Past Medical History:  Diagnosis Date   Acute diverticulitis 08/17/2020   Acute MI (HCC) 08/2011   Arthritis    Bilateral pneumonia    Diagnosed after STEMI 08/2011   CAD (coronary artery disease)    a. Diagnosed 08/2011 with anterior STEMI due to thrombotic occlusion  of mid LAD s/p thrombectomy, PTCA, DES placement 08/23/11.   CHF (congestive heart failure) (HCC)    Chronic combined systolic and diastolic congestive heart failure (HCC) 09/15/2011   Dyslipidemia    Gout    Hypertension    Ischemic cardiomyopathy    a. Initial EF 35% by cath 08/23/11, improved to 40-45% by echo 08/25/11. 50-55% by echo November 2013.   Shortness of breath     Has the patient had major surgery during 100 days prior to admission? Yes  Family History   family history includes Hypertension in his father; Stroke in his mother.  Current Medications  Current Facility-Administered Medications:    acetaminophen  (TYLENOL ) tablet 650 mg, 650 mg, Oral, Q4H PRN **OR** acetaminophen  (TYLENOL ) suppository 650 mg, 650 mg, Rectal, Q4H PRN, [name], MD   bisacodyl  (DULCOLAX) suppository 10 mg, 10  mg, Rectal, Daily PRN, [name], MD, 10 mg at 09/13/23 0845   Chlorhexidine  Gluconate Cloth 2 % PADS 6 each, 6 each, Topical, Daily, [name], MD, 6 each at 09/13/23 1102   chlorthalidone  (HYGROTON ) tablet 25 mg, 25 mg, Oral, Daily, [name], MD, 25 mg at 09/13/23 0848   cyclobenzaprine  (FLEXERIL ) tablet 10 mg, 10 mg, Oral, TID PRN, [name], MD   docusate sodium  (COLACE) capsule 100 mg, 100 mg, Oral, BID, [name], MD, 100 mg at 09/13/23 9152   ezetimibe  (ZETIA ) tablet 10 mg, 10 mg, Oral, Daily, [name], MD, 10 mg at 09/13/23 0848   lisinopril  (ZESTRIL ) tablet 20 mg, 20 mg, Oral, Daily, [name], MD, 20 mg at 09/13/23 0848   menthol -cetylpyridinium (CEPACOL) lozenge 3 mg, 1 lozenge, Oral, PRN **OR** phenol (CHLORASEPTIC) mouth spray 1 spray, 1 spray, Mouth/Throat, PRN, [name], MD   methocarbamol  (ROBAXIN ) tablet 500 mg, 500 mg, Oral, BID, [name], MD, 500 mg at 09/13/23 9151   metoprolol  succinate (TOPROL -XL) 24 hr tablet 25 mg, 25 mg, Oral, Daily, [name], MD, 25 mg at 09/13/23 0848   morphine  (PF) 2 MG/ML injection 2-4 mg, 2-4 mg, Intravenous, Q2H PRN, [name], MD   mupirocin  ointment (BACTROBAN ) 2 % 1 Application, 1 Application, Nasal, BID, [name], MD, 1 Application at 09/13/23 9150   nitroGLYCERIN  (NITROSTAT ) SL tablet 0.4 mg, 0.4 mg, Sublingual, Q5 min PRN, [name], MD   ondansetron  (ZOFRAN ) tablet 4 mg, 4 mg, Oral, Q6H PRN **OR** ondansetron  (ZOFRAN ) injection 4 mg, 4 mg, Intravenous, Q6H PRN, [name], MD   oxyCODONE  (Oxy IR/ROXICODONE ) immediate release tablet 10 mg, 10 mg, Oral, Q3H PRN, [name], MD   oxyCODONE  (Oxy IR/ROXICODONE ) immediate release tablet 5 mg, 5 mg, Oral, Q3H PRN, [name], MD   polyethylene glycol (MIRALAX  / GLYCOLAX ) packet 17 g, 17 g, Oral, Daily, Bergman, Meghan D, NP, 17 g at 09/13/23 1101   rosuvastatin  (CRESTOR ) tablet 40 mg, 40  mg, Oral, Daily, [name], MD, 40 mg at 09/13/23 0848   sodium chloride  flush (NS) 0.9 % injection 3 mL, 3 mL, Intravenous, Q12H, [name], MD, 3 mL at 09/13/23 0849   sodium chloride  flush (NS) 0.9 % injection 3 mL, 3 mL, Intravenous, PRN, [name], MD, 3 mL at 09/12/23 9491   sodium phosphate  (FLEET) enema 1 enema, 1 enema, Rectal, Daily PRN, Bergman, Meghan D, NP   tamsulosin  (FLOMAX ) capsule 0.4 mg, 0.4 mg, Oral, QPC breakfast, Bergman, Meghan D, NP, 0.4 mg at 09/13/23 1101  Patients Current Diet:  Diet Order  Diet Heart Room service appropriate? Yes; Fluid consistency: Thin  Diet effective now                   Precautions / Restrictions Precautions Precautions: Back, Fall Precaution Booklet Issued: No Precaution/Restrictions Comments: Educated on 3/3 back precautions. Spinal Brace: Lumbar corset, Applied in sitting position Restrictions Weight Bearing Restrictions Per Provider Order: No   Has the patient had 2 or more falls or a fall with injury in the past year? No  Prior Activity Level Limited Community (1-2x/wk): Worked from home.  Went out 1-2 times a week, was driving.  Prior Functional Level Self Care: Did the patient need help bathing, dressing, using the toilet or eating? Independent  Indoor Mobility: Did the patient need assistance with walking from room to room (with or without device)? Independent  Stairs: Did the patient need assistance with internal or external stairs (with or without device)? Independent  Functional Cognition: Did the patient need help planning regular tasks such as shopping or remembering to take medications? Independent  Patient Information Are you of Hispanic, Latino/a,or Spanish origin?: A. No, not of Hispanic, Latino/a, or Spanish origin What is your race?: A. White Do you need or want an interpreter to communicate with a doctor or health care staff?: 0. No Patient information obtained via proxy  : no  Patient's Response To:  Health Literacy and Transportation Is the patient able to respond to health literacy and transportation needs?: Yes Health Literacy - How often do you need to have someone help you when you read instructions, pamphlets, or other written material from your doctor or pharmacy?: Never In the past 12 months, has lack of transportation kept you from medical appointments or from getting medications?: No In the past 12 months, has lack of transportation kept you from meetings, work, or from getting things needed for daily living?: No Higher education careers adviser obtained via proxy: no  Journalist, newspaper / Equipment Home Equipment: Rollator (4 wheels), Cane - single point, Crutches  Prior Device Use: Indicate devices/aids used by the patient prior to current illness, exacerbation or injury? None of the above  Current Functional Level Cognition  Orientation Level: Oriented X4    Extremity Assessment (includes Sensation/Coordination)  Upper Extremity Assessment: Overall WFL for tasks assessed, Right hand dominant, RUE deficits/detail, LUE deficits/detail RUE Deficits / Details: Chronic RCT, non operative RUE Sensation: WNL RUE Coordination: WNL LUE Sensation: WNL LUE Coordination: WNL  Lower Extremity Assessment: Defer to PT evaluation    ADLs  Overall ADL's : Needs assistance/impaired Eating/Feeding: Independent, Sitting Grooming: Wash/dry hands, Wash/dry face, Set up, Sitting Upper Body Bathing: Supervision/ safety, Sitting Lower Body Bathing: Moderate assistance, Sitting/lateral leans Upper Body Dressing : Supervision/safety, Sitting Lower Body Dressing: Moderate assistance, Sitting/lateral leans Toilet Transfer: Moderate assistance, Regular Teacher, adult education Details (indicate cue type and reason): use of Stedy    Mobility  Overal bed mobility: Needs Assistance Bed Mobility: Rolling, Sidelying to Sit Rolling: Contact guard  assist Sidelying to sit: Contact guard assist Sit to sidelying: Min assist General bed mobility comments: worked on tolerating flat bed 2 mins, then bridging knees one at a time and core activation with pelvic tilts. Pt tolerated well and then able to roll to R with use of bed rail. CGA for SL to sit but no physical assist given    Transfers  Overall transfer level: Needs assistance Equipment used: Ambulation equipment used, Rolling walker (2 wheels) Transfers: Sit to/from Stand, Bed to chair/wheelchair/BSC Sit  to Stand: Min assist, From elevated surface, Via lift equipment, +2 safety/equipment Bed to/from chair/wheelchair/BSC transfer type:: Via Lift equipment Step pivot transfers: Mod assist Transfer via Lift Equipment: Stedy General transfer comment: began with stedy for safety and to improve pt confidence. Pt able to stand with min A for power up and cues for posture. Pivoted to chair in stedy and then worked on STS to [employer] from [employer] Needed min A for safety but had sufficient LE strength to stand    Ambulation / Gait / Stairs / Wheelchair Mobility  Ambulation/Gait Ambulation/Gait assistance: Mod assist, +2 safety/equipment Gait Distance (Feet): 35 Feet (5' fwd, 5' bkwd, 25' fwd) Assistive device: Rolling walker (2 wheels) Gait Pattern/deviations: Step-through pattern, Decreased stride length General Gait Details: pt leaning heavily on RW which made bkwd stepping difficult but pt improved with practice. Pt has increased low back pain with stepping compared to yesterday but was pleased to have increased sensation in BLE's. Knees buckling slightly by end of 25'. Chair brought behind for safety Gait velocity: decreased Gait velocity interpretation: <1.31 ft/sec, indicative of household ambulator Pre-gait activities: wt shifting and then stepping    Posture / Balance Balance Overall balance assessment: Needs assistance Sitting-balance support: Feet supported Sitting balance-Leahy  Scale: Good Standing balance support: Reliant on assistive device for balance, Bilateral upper extremity supported Standing balance-Leahy Scale: Poor Standing balance comment: needed mod A for dynamic balance    Special considerations/life events  Skin Post op surgical back incision with dressing   Previous Home Environment (from acute therapy documentation) Living Arrangements: Spouse/significant other Available Help at Discharge: Family, Available 24 hours/day Type of Home: House Home Layout: Two level, Bed/bath upstairs Alternate Level Stairs-Rails: Right Alternate Level Stairs-Number of Steps: flight Home Access: Stairs to enter Entrance Stairs-Rails: None Entrance Stairs-Number of Steps: 2 Firefighter: Standard Home Care Services: No  Discharge Living Setting Plans for Discharge Living Setting: Patient's home, House, Lives with (comment) (Lives with wife.) Type of Home at Discharge: House Discharge Home Layout: Two level, 1/2 bath on main level, Bed/bath upstairs Alternate Level Stairs-Rails: Right Alternate Level Stairs-Number of Steps: 13 Discharge Home Access: Stairs to enter Entrance Stairs-Rails: None Entrance Stairs-Number of Steps: 2 Discharge Bathroom Shower/Tub: Walk-in shower, Door Discharge Bathroom Toilet: Handicapped height Discharge Bathroom Accessibility: Yes How Accessible: Accessible via walker Does the patient have any problems obtaining your medications?: No  Social/Family/Support Systems Patient Roles: Spouse Contact Information: Davi Kroon - wife Anticipated Caregiver: wife Anticipated Caregiver's Contact Information: [name] - wife - [phone] Ability/Limitations of Caregiver: Wife works but can take two weeks off work after patient discharge to assist Caregiver Availability: 24/7 Discharge Plan Discussed with Primary Caregiver: Yes Is Caregiver In Agreement with Plan?: Yes Does Caregiver/Family have Issues with Lodging/Transportation  while Pt is in Rehab?: No  Goals Patient/Family Goal for Rehab: PT/OT mod I and supervision goals Expected length of stay: 7 days Pt/Family Agrees to Admission and willing to participate: Yes Program Orientation Provided & Reviewed with Pt/Caregiver Including Roles  & Responsibilities: Yes  Decrease burden of Care through IP rehab admission: N/A  Possible need for SNF placement upon discharge: Not anticipated  Patient Condition: I have reviewed medical records from [facility], spoken with CM, and patient. I met with patient at the bedside for inpatient rehabilitation assessment.  Patient will benefit from ongoing PT and OT, can actively participate in 3 hours of therapy a day 5 days of the week, and can make measurable gains during the admission.  Patient will also benefit from the coordinated team approach during an Inpatient Acute Rehabilitation admission.  The patient will receive intensive therapy as well as Rehabilitation physician, nursing, social worker, and care management interventions.  Due to bladder management, bowel management, safety, skin/wound care, disease management, medication administration, pain management, and patient education the patient requires 24 hour a day rehabilitation nursing.  The patient is currently min to mod assist with mobility and basic ADLs.  Discharge setting and therapy post discharge at home with home health is anticipated.  Patient has agreed to participate in the Acute Inpatient Rehabilitation Program and will admit today.  Preadmission Screen Completed By:  Lovett CHRISTELLA Ropes, and Reche Lowers 09/13/2023 3:19 PM ______________________________________________________________________   Discussed status with Dr. Urbano on 09/14/23  at 12:23 PM  and received approval for admission today.  Admission Coordinator:  Lovett CHRISTELLA Ropes, RN, and Reche Lowers  time 12:23 PM Pattricia 09/14/23    Assessment/Plan: Diagnosis: Cauda equina s/p PLIF Does the  need for close, 24 hr/day Medical supervision in concert with the patient's rehab needs make it unreasonable for this patient to be served in a less intensive setting? Yes Co-Morbidities requiring supervision/potential complications: CAD, hx diverticulitis, CHF, dyslipidemia, gout, HTN Due to bladder management, bowel management, safety, skin/wound care, disease management, medication administration, pain management, and patient education, does the patient require 24 hr/day rehab nursing? Yes Does the patient require coordinated care of a physician, rehab nurse, PT, OT, and SLP to address physical and functional deficits in the context of the above medical diagnosis(es)? Yes Addressing deficits in the following areas: balance, endurance, locomotion, strength, transferring, bowel/bladder control, bathing, dressing, feeding, grooming, toileting, and psychosocial support Can the patient actively participate in an intensive therapy program of at least 3 hrs of therapy 5 days a week? Yes The potential for patient to make measurable gains while on inpatient rehab is excellent Anticipated functional outcomes upon discharge from inpatient rehab: modified independent and supervision PT, modified independent and supervision OT, n/a SLP Estimated rehab length of stay to reach the above functional goals is: 7 Anticipated discharge destination: Home 10. Overall Rehab/Functional Prognosis: excellent   MD Signature: Murray Urbano

## 2023-09-13 NOTE — Progress Notes (Signed)
 Mobility Specialist Progress Note:    09/13/23 1207  Mobility  Activity Pivoted/transferred from bed to chair  Level of Assistance Contact guard assist, steadying assist  Assistive Device Front wheel walker  Distance Ambulated (ft) 5 ft  Activity Response Tolerated well  Mobility Referral Yes  Mobility visit 1 Mobility  Mobility Specialist Start Time (ACUTE ONLY) 1040  Mobility Specialist Stop Time (ACUTE ONLY) 1055  Mobility Specialist Time Calculation (min) (ACUTE ONLY) 15 min   Pt received in bed agreeable to mobility. +2 for safety. Pt was able to perform better than initial PT eval. No c/o throughout. Was able to practice their back precautions w/o fault. Returned to bed w/ call bell and personal belongings in reach. All needs met.  Thersia Minder Mobility Specialist  Please contact vis Secure Chat or  Rehab Office [phone]

## 2023-09-14 ENCOUNTER — Other Ambulatory Visit: Payer: Self-pay

## 2023-09-14 ENCOUNTER — Encounter (HOSPITAL_COMMUNITY): Payer: Self-pay | Admitting: Physical Medicine and Rehabilitation

## 2023-09-14 ENCOUNTER — Encounter (HOSPITAL_COMMUNITY): Payer: Self-pay | Admitting: Neurosurgery

## 2023-09-14 ENCOUNTER — Inpatient Hospital Stay (HOSPITAL_COMMUNITY)
Admission: AD | Admit: 2023-09-14 | Discharge: 2023-09-23 | DRG: 074 | Disposition: A | Discharge status: Home / Self Care | Source: Intra-hospital | Attending: Physical Medicine and Rehabilitation | Admitting: Physical Medicine and Rehabilitation

## 2023-09-14 DIAGNOSIS — I110 Hypertensive heart disease with heart failure: Secondary | ICD-10-CM | POA: Diagnosis not present

## 2023-09-14 DIAGNOSIS — M25369 Other instability, unspecified knee: Secondary | ICD-10-CM | POA: Diagnosis present

## 2023-09-14 DIAGNOSIS — I5042 Chronic combined systolic (congestive) and diastolic (congestive) heart failure: Secondary | ICD-10-CM | POA: Diagnosis not present

## 2023-09-14 DIAGNOSIS — I2510 Atherosclerotic heart disease of native coronary artery without angina pectoris: Secondary | ICD-10-CM | POA: Diagnosis not present

## 2023-09-14 DIAGNOSIS — Z8249 Family history of ischemic heart disease and other diseases of the circulatory system: Secondary | ICD-10-CM

## 2023-09-14 DIAGNOSIS — R7989 Other specified abnormal findings of blood chemistry: Secondary | ICD-10-CM | POA: Diagnosis not present

## 2023-09-14 DIAGNOSIS — I10 Essential (primary) hypertension: Secondary | ICD-10-CM | POA: Diagnosis present

## 2023-09-14 DIAGNOSIS — R339 Retention of urine, unspecified: Secondary | ICD-10-CM | POA: Diagnosis present

## 2023-09-14 DIAGNOSIS — I25810 Atherosclerosis of coronary artery bypass graft(s) without angina pectoris: Secondary | ICD-10-CM | POA: Diagnosis not present

## 2023-09-14 DIAGNOSIS — M623 Immobility syndrome (paraplegic): Secondary | ICD-10-CM | POA: Diagnosis not present

## 2023-09-14 DIAGNOSIS — G834 Cauda equina syndrome: Principal | ICD-10-CM | POA: Diagnosis present

## 2023-09-14 DIAGNOSIS — R7303 Prediabetes: Secondary | ICD-10-CM | POA: Diagnosis present

## 2023-09-14 DIAGNOSIS — E785 Hyperlipidemia, unspecified: Secondary | ICD-10-CM | POA: Diagnosis not present

## 2023-09-14 DIAGNOSIS — E876 Hypokalemia: Secondary | ICD-10-CM | POA: Diagnosis not present

## 2023-09-14 DIAGNOSIS — K594 Anal spasm: Secondary | ICD-10-CM | POA: Diagnosis not present

## 2023-09-14 DIAGNOSIS — D72829 Elevated white blood cell count, unspecified: Secondary | ICD-10-CM | POA: Diagnosis present

## 2023-09-14 DIAGNOSIS — T380X5A Adverse effect of glucocorticoids and synthetic analogues, initial encounter: Secondary | ICD-10-CM | POA: Diagnosis not present

## 2023-09-14 DIAGNOSIS — Z79899 Other long term (current) drug therapy: Secondary | ICD-10-CM

## 2023-09-14 DIAGNOSIS — Z7982 Long term (current) use of aspirin: Secondary | ICD-10-CM

## 2023-09-14 DIAGNOSIS — Z981 Arthrodesis status: Secondary | ICD-10-CM

## 2023-09-14 DIAGNOSIS — I255 Ischemic cardiomyopathy: Secondary | ICD-10-CM | POA: Diagnosis not present

## 2023-09-14 DIAGNOSIS — Z823 Family history of stroke: Secondary | ICD-10-CM | POA: Diagnosis not present

## 2023-09-14 DIAGNOSIS — Z4789 Encounter for other orthopedic aftercare: Secondary | ICD-10-CM

## 2023-09-14 DIAGNOSIS — M48061 Spinal stenosis, lumbar region without neurogenic claudication: Secondary | ICD-10-CM | POA: Diagnosis present

## 2023-09-14 DIAGNOSIS — Z955 Presence of coronary angioplasty implant and graft: Secondary | ICD-10-CM

## 2023-09-14 DIAGNOSIS — I252 Old myocardial infarction: Secondary | ICD-10-CM | POA: Diagnosis not present

## 2023-09-14 DIAGNOSIS — K592 Neurogenic bowel, not elsewhere classified: Secondary | ICD-10-CM | POA: Diagnosis present

## 2023-09-14 DIAGNOSIS — M79672 Pain in left foot: Secondary | ICD-10-CM | POA: Diagnosis not present

## 2023-09-14 DIAGNOSIS — F17290 Nicotine dependence, other tobacco product, uncomplicated: Secondary | ICD-10-CM | POA: Diagnosis present

## 2023-09-14 DIAGNOSIS — Z888 Allergy status to other drugs, medicaments and biological substances status: Secondary | ICD-10-CM

## 2023-09-14 DIAGNOSIS — R7401 Elevation of levels of liver transaminase levels: Secondary | ICD-10-CM | POA: Diagnosis not present

## 2023-09-14 DIAGNOSIS — M79671 Pain in right foot: Secondary | ICD-10-CM | POA: Diagnosis not present

## 2023-09-14 MED ORDER — TRAZODONE HCL 50 MG PO TABS
25.0000 mg | ORAL_TABLET | Freq: Every evening | ORAL | Status: DC | PRN
  Administered 2023-09-20 – 2023-09-22 (×3): 50 mg via ORAL
  Filled 2023-09-14 (×3): qty 1

## 2023-09-14 MED ORDER — OXYCODONE HCL 5 MG PO TABS: 10.0000 mg | ORAL_TABLET | ORAL | Status: DC | PRN

## 2023-09-14 MED ORDER — POLYETHYLENE GLYCOL 3350 17 G PO PACK: 17.0000 g | PACK | Freq: Every day | ORAL | Status: DC | PRN

## 2023-09-14 MED ORDER — LIDOCAINE HCL URETHRAL/MUCOSAL 2 % EX GEL: CUTANEOUS | Status: DC | PRN

## 2023-09-14 MED ORDER — ENOXAPARIN SODIUM 30 MG/0.3ML IJ SOSY
30.0000 mg | PREFILLED_SYRINGE | INTRAMUSCULAR | Status: DC
  Administered 2023-09-14: 30 mg via SUBCUTANEOUS
  Filled 2023-09-14: qty 0.3

## 2023-09-14 MED ORDER — POLYETHYLENE GLYCOL 3350 17 G PO PACK
17.0000 g | PACK | Freq: Every day | ORAL | Status: DC
Start: 2023-09-15 — End: 2023-09-22

## 2023-09-14 MED ORDER — MELATONIN 5 MG PO TABS
10.0000 mg | ORAL_TABLET | Freq: Every evening | ORAL | Status: DC | PRN
  Administered 2023-09-21 – 2023-09-22 (×3): 10 mg via ORAL
  Filled 2023-09-14 (×3): qty 2

## 2023-09-14 MED ORDER — PROSOURCE PLUS PO LIQD
30.0000 mL | Freq: Two times a day (BID) | ORAL | Status: DC
  Administered 2023-09-15 – 2023-09-16 (×3): 30 mL via ORAL
  Filled 2023-09-14 (×4): qty 30

## 2023-09-14 MED ORDER — PHENOL 1.4 % MT LIQD: 1.0000 | OROMUCOSAL | Status: DC | PRN

## 2023-09-14 MED ORDER — ROSUVASTATIN CALCIUM 20 MG PO TABS
40.0000 mg | ORAL_TABLET | Freq: Every day | ORAL | Status: DC
  Administered 2023-09-15 – 2023-09-23 (×9): 40 mg via ORAL
  Filled 2023-09-14 (×10): qty 2

## 2023-09-14 MED ORDER — ASPIRIN 81 MG PO TBEC
81.0000 mg | DELAYED_RELEASE_TABLET | Freq: Every day | ORAL | Status: DC
  Administered 2023-09-14 – 2023-09-23 (×10): 81 mg via ORAL
  Filled 2023-09-14 (×10): qty 1

## 2023-09-14 MED ORDER — POLYETHYLENE GLYCOL 3350 17 G PO PACK
17.0000 g | PACK | Freq: Every day | ORAL | Status: DC
  Administered 2023-09-15 – 2023-09-22 (×5): 17 g via ORAL
  Filled 2023-09-14 (×9): qty 1

## 2023-09-14 MED ORDER — OXYCODONE HCL 5 MG PO TABS: 5.0000 mg | ORAL_TABLET | ORAL | Status: DC | PRN

## 2023-09-14 MED ORDER — GUAIFENESIN-DM 100-10 MG/5ML PO SYRP: 5.0000 mL | ORAL_SOLUTION | Freq: Four times a day (QID) | ORAL | Status: DC | PRN

## 2023-09-14 MED ORDER — MUPIROCIN 2 % EX OINT: 1.0000 | TOPICAL_OINTMENT | Freq: Two times a day (BID) | CUTANEOUS | Status: DC

## 2023-09-14 MED ORDER — TAMSULOSIN HCL 0.4 MG PO CAPS: 0.4000 mg | ORAL_CAPSULE | Freq: Every day | ORAL | Status: DC

## 2023-09-14 MED ORDER — NITROGLYCERIN 0.4 MG SL SUBL: 0.4000 mg | SUBLINGUAL_TABLET | SUBLINGUAL | Status: DC | PRN

## 2023-09-14 MED ORDER — TAMSULOSIN HCL 0.4 MG PO CAPS
0.4000 mg | ORAL_CAPSULE | Freq: Every day | ORAL | Status: DC
  Administered 2023-09-15 – 2023-09-22 (×8): 0.4 mg via ORAL
  Filled 2023-09-14 (×8): qty 1

## 2023-09-14 MED ORDER — BISACODYL 10 MG RE SUPP
10.0000 mg | Freq: Every day | RECTAL | Status: DC
  Administered 2023-09-14 – 2023-09-15 (×2): 10 mg via RECTAL
  Filled 2023-09-14 (×2): qty 1

## 2023-09-14 MED ORDER — SORBITOL 70 % SOLN: 30.0000 mL | Freq: Every day | Status: DC | PRN

## 2023-09-14 MED ORDER — VITAMIN B-12 100 MCG PO TABS
100.0000 ug | ORAL_TABLET | Freq: Every day | ORAL | Status: DC
  Administered 2023-09-15 – 2023-09-23 (×9): 100 ug via ORAL
  Filled 2023-09-14 (×9): qty 1

## 2023-09-14 MED ORDER — CHLORTHALIDONE 25 MG PO TABS
25.0000 mg | ORAL_TABLET | Freq: Every day | ORAL | Status: DC
  Filled 2023-09-14: qty 1

## 2023-09-14 MED ORDER — EZETIMIBE 10 MG PO TABS
10.0000 mg | ORAL_TABLET | Freq: Every day | ORAL | Status: DC
  Administered 2023-09-15 – 2023-09-23 (×9): 10 mg via ORAL
  Filled 2023-09-14 (×9): qty 1

## 2023-09-14 MED ORDER — VITAMIN C 500 MG PO TABS
1000.0000 mg | ORAL_TABLET | Freq: Every day | ORAL | Status: DC
  Administered 2023-09-15 – 2023-09-22 (×8): 1000 mg via ORAL
  Filled 2023-09-14 (×8): qty 2

## 2023-09-14 MED ORDER — METOPROLOL SUCCINATE ER 25 MG PO TB24
25.0000 mg | ORAL_TABLET | Freq: Every day | ORAL | Status: DC
  Administered 2023-09-15 – 2023-09-22 (×7): 25 mg via ORAL
  Filled 2023-09-14 (×8): qty 1

## 2023-09-14 MED ORDER — ACETAMINOPHEN 325 MG PO TABS: 325.0000 mg | ORAL_TABLET | ORAL | Status: DC | PRN

## 2023-09-14 MED ORDER — METOPROLOL SUCCINATE ER 25 MG PO TB24: 25.0000 mg | ORAL_TABLET | Freq: Every day | ORAL | Status: DC

## 2023-09-14 MED ORDER — ALUM & MAG HYDROXIDE-SIMETH 200-200-20 MG/5ML PO SUSP: 30.0000 mL | ORAL | Status: DC | PRN

## 2023-09-14 MED ORDER — CHLORTHALIDONE 25 MG PO TABS
12.5000 mg | ORAL_TABLET | Freq: Every day | ORAL | Status: DC
  Administered 2023-09-15: 12.5 mg via ORAL
  Filled 2023-09-14: qty 0.5

## 2023-09-14 MED ORDER — CHLORHEXIDINE GLUCONATE CLOTH 2 % EX PADS: 6.0000 | MEDICATED_PAD | Freq: Every day | CUTANEOUS | Status: AC

## 2023-09-14 MED ORDER — MUPIROCIN 2 % EX OINT
1.0000 | TOPICAL_OINTMENT | Freq: Two times a day (BID) | CUTANEOUS | Status: AC
  Administered 2023-09-14 – 2023-09-16 (×4): 1 via NASAL
  Filled 2023-09-14: qty 22

## 2023-09-14 MED ORDER — LISINOPRIL 10 MG PO TABS
10.0000 mg | ORAL_TABLET | Freq: Every day | ORAL | Status: DC
Start: 2023-09-15 — End: 2023-09-18
  Administered 2023-09-15 – 2023-09-18 (×4): 10 mg via ORAL
  Filled 2023-09-14 (×4): qty 1

## 2023-09-14 MED ORDER — ACETAMINOPHEN 325 MG PO TABS
650.0000 mg | ORAL_TABLET | ORAL | Status: DC | PRN
Start: 2023-09-14 — End: 2023-09-22

## 2023-09-14 MED ORDER — MENTHOL 3 MG MT LOZG: 1.0000 | LOZENGE | OROMUCOSAL | Status: DC | PRN

## 2023-09-14 MED ORDER — FLEET ENEMA RE ENEM: 1.0000 | ENEMA | Freq: Once | RECTAL | Status: DC | PRN

## 2023-09-14 MED ORDER — METHOCARBAMOL 500 MG PO TABS
500.0000 mg | ORAL_TABLET | Freq: Four times a day (QID) | ORAL | Status: DC | PRN
  Administered 2023-09-20: 500 mg via ORAL
  Filled 2023-09-14: qty 1

## 2023-09-14 NOTE — Progress Notes (Signed)
 Patient transferred to 4W-18 via wheelchair by Madelin, RN. Report called to Autumn on 4W. Patient stable at time of transfer. Wife at bedside and all belongings sent with patient.

## 2023-09-14 NOTE — Progress Notes (Signed)
 Occupational Therapy Treatment Patient Details Name: Austin Bright MRN: [id number] DOB: 1945-09-10 Today's Date: 09/14/2023   History of present illness Pt is a 78 y.o. male admitted 8/23 with lumbar spinal stenosis, cauda equina syndrome. He emergently underwent PLIF. PMH:  MI, OA, CAD, CHF, HTN   OT comments  Patient completed grooming task seated sinkside including shaving.  Patient had just completed PT session which involved mobility, patient not pushed to progress to stand grooming.  He is scheduled to transition to AIR this date, will defer to AIR.  OT will continue efforts if he remains in the acute setting.         If plan is discharge home, recommend the following:  Assist for transportation;Assistance with cooking/housework;A lot of help with bathing/dressing/bathroom;A lot of help with walking and/or transfers   Equipment Recommendations  None recommended by OT    Recommendations for Other Services      Precautions / Restrictions Precautions Precautions: Back;Fall Recall of Precautions/Restrictions: Intact Required Braces or Orthoses: Spinal Brace Spinal Brace: Lumbar corset Restrictions Weight Bearing Restrictions Per Provider Order: No       Mobility Bed Mobility                    Transfers                         Balance Overall balance assessment: Needs assistance Sitting-balance support: Feet supported Sitting balance-Leahy Scale: Good                                     ADL either performed or assessed with clinical judgement   ADL       Grooming: Wash/dry face;Set up;Sitting Grooming Details (indicate cue type and reason): Shave seated sinkside                                    Extremity/Trunk Assessment Upper Extremity Assessment RUE Deficits / Details: Chronic RCT, non operative RUE Sensation: WNL RUE Coordination: WNL LUE Sensation: WNL LUE Coordination: WNL   Lower Extremity  Assessment Lower Extremity Assessment: Defer to PT evaluation   Cervical / Trunk Assessment Cervical / Trunk Assessment: Back Surgery    Vision Patient Visual Report: No change from baseline     Perception Perception Perception: Not tested   Praxis Praxis Praxis: Not tested   Communication Communication Communication: No apparent difficulties   Cognition Arousal: Alert Behavior During Therapy: WFL for tasks assessed/performed Cognition: No apparent impairments                               Following commands: Intact        Cueing   Cueing Techniques: Verbal cues  Exercises      Shoulder Instructions       General Comments pt requires cues to remind of breathing suring mobility, VSS. Wife present    Pertinent Vitals/ Pain       Pain Assessment Pain Assessment: Faces Faces Pain Scale: Hurts a little bit Pain Location: low back Pain Descriptors / Indicators: Aching Pain Intervention(s): Monitored during session  Frequency  Min 2X/week        Progress Toward Goals  OT Goals(current goals can now be found in the care plan section)  Progress towards OT goals: Progressing toward goals  Acute Rehab OT Goals OT Goal Formulation: With patient Time For Goal Achievement: 09/27/23 Potential to Achieve Goals: Good  Plan      Co-evaluation                 AM-PAC OT 6 Clicks Daily Activity     Outcome Measure   Help from another person eating meals?: None Help from another person taking care of personal grooming?: None Help from another person toileting, which includes using toliet, bedpan, or urinal?: A Lot Help from another person bathing (including washing, rinsing, drying)?: A Lot Help from another person to put on and taking off regular upper body clothing?: A Little Help from another person to put on and taking off regular lower body clothing?: A  Lot 6 Click Score: 17    End of Session    OT Visit Diagnosis: Unsteadiness on feet (R26.81);Muscle weakness (generalized) (M62.81)   Activity Tolerance Patient tolerated treatment well   Patient Left in chair;with call bell/phone within reach;with family/visitor present   Nurse Communication Precautions        Time: 1320-1340 OT Time Calculation (min): 20 min  Charges: OT General Charges $OT Visit: 1 Visit OT Treatments $Self Care/Home Management : 8-22 mins  09/14/2023  RP, OTR/L  Acute Rehabilitation Services  Office:  [phone]   Charlie JONETTA Halsted 09/14/2023, 2:04 PM

## 2023-09-14 NOTE — Plan of Care (Signed)
   Problem: Clinical Measurements: Goal: Will remain free from infection Outcome: Progressing   Problem: Pain Managment: Goal: General experience of comfort will improve and/or be controlled Outcome: Progressing   Problem: Safety: Goal: Ability to remain free from injury will improve Outcome: Progressing

## 2023-09-14 NOTE — Care Management Important Message (Signed)
 Important Message  Patient Details  Name: Austin Bright MRN: [id number] Date of Birth: 1945/10/23   Important Message Given:  Yes - Medicare IM     Jon Cruel 09/14/2023, 3:34 PM

## 2023-09-14 NOTE — H&P (Signed)
 Physical Medicine and Rehabilitation Admission H&P    Chief Complaint  Patient presents with   Cauda equina syndrome.     HPI:  Austin Bright. Skellenger II is a 78 year old male with history of CAD/ICM with combined CHF, HTN, gout, new onset of LBP who was  seen in ED 09/09/23 and treated with dose of decadron  and discharged to home with pain meds and muscle relaxer due to likely lumbar strain. He presented to ED on 09/11/23 with reports of progressive pain, inability to walk, perineal numbness and urinary retention with inability to void. MRI lumbar spine revealed severe spinal stenosis L3/L4, L4/L5, bulky disc extrusion L4-L5 and moderate spinal stenosis L2/L3. Bladder scanned for 480 cc and foley placed for cauda equina syndrome.  He was taken to OR emergently for bilateral L4 and L5 laminectomy for decompression of nerve roots and interbody fusion by Dr. Mavis on the same day.  Post op LSO to be donned at EOB. Sensation BLE improving but he continues to have bilateral knee instability, foley removed yesterday and reported to be voiding.  Reports he feels bladder function improving and reports he has been continent. Reports continent bowel movement today. Reports sensation was intact during BM.   Hx of Rotator cuff tear R shoulder. Treated non-surgically.   Reports soreness in his lower back but overall pain is controlled. Continued altered sensation in his lower legs, feet and groin.   PT/OT has been working with patient who requires min to mod assist with mobility and min assist with ADL tasks. 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 CIR recommended due to functional decline.   Review of Systems  Constitutional:  Negative for chills and fever.  HENT:  Negative for hearing loss and tinnitus.   Eyes:  Negative for blurred vision.  Respiratory:  Negative for cough and shortness of breath.   Cardiovascular:  Negative for chest  pain and palpitations.  Gastrointestinal:  Positive for constipation. Negative for abdominal pain, diarrhea, heartburn and vomiting.  Genitourinary:  Positive for dysuria.  Musculoskeletal:  Positive for back pain.       R-RTC tear managed with conservative measures.  Skin:  Negative for rash.  Neurological:  Positive for sensory change and focal weakness. Negative for dizziness and headaches.  Psychiatric/Behavioral:  Negative for memory loss. The patient has insomnia (uses CBD gummies--gets 6 hours of sleep on average).      Past Medical History:  Diagnosis Date   Acute diverticulitis 08/17/2020   Acute MI (HCC) 08/2011   Arthritis    Bilateral pneumonia    Diagnosed after STEMI 08/2011   CAD (coronary artery disease)    a. Diagnosed 08/2011 with anterior STEMI due to thrombotic occlusion of mid LAD s/p thrombectomy, PTCA, DES placement 08/23/11.   CHF (congestive heart failure) (HCC)    Chronic combined systolic and diastolic congestive heart failure (HCC) 09/15/2011   Dyslipidemia    Gout    Hypertension    Ischemic cardiomyopathy    a. Initial EF 35% by cath 08/23/11, improved to 40-45% by echo 08/25/11. 50-55% by echo November 2013.   Rotator cuff tear arthropathy, right    Shortness of breath     Past Surgical History:  Procedure Laterality Date   carpel tunnel Bilateral 2002   COLONOSCOPY WITH PROPOFOL  N/A 04/08/2017   Surgeon: [name], MD;  diverticulosis in sigmoid and descending colon, internal hemorrhoids, otherwise normal exam.  No recommendations to repeat due to age.   COLONOSCOPY WITH PROPOFOL  N/A 03/17/2021   Procedure: COLONOSCOPY WITH PROPOFOL ;  Surgeon: [name], DO;  Location: [facility];  Service: Endoscopy;  Laterality: N/A;  8:00am   CORONARY STENT PLACEMENT     CYSTECTOMY  1969   pilonidal cyst   LEFT AND RIGHT HEART CATHETERIZATION WITH CORONARY ANGIOGRAM N/A 09/15/2011   Procedure: LEFT AND RIGHT HEART CATHETERIZATION WITH CORONARY  ANGIOGRAM;  Surgeon: [name], MD;  Location: [facility] CATH LAB;  Service: Cardiovascular;  Laterality: N/A;   LEFT HEART CATH N/A 08/23/2011   Procedure: LEFT HEART CATH;  Surgeon: [name], MD;  Location: [facility] CATH LAB;  Service: Cardiovascular;  Laterality: N/A;   NO PAST SURGERIES     PERCUTANEOUS CORONARY STENT INTERVENTION (PCI-S)  08/23/2011   Procedure: PERCUTANEOUS CORONARY STENT INTERVENTION (PCI-S);  Surgeon: [name], MD;  Location: [facility] CATH LAB;  Service: Cardiovascular;;    Family History  Problem Relation Age of Onset   Stroke Mother    Hypertension Father    Colon cancer Neg Hx    Pancreatic cancer Neg Hx     Social History:  Married. Independent prior to back issues. He  reports that he quit smoking about 44 years ago. His smoking use included cigarettes. He started smoking about 69 years ago. He has a 25 pack-year smoking history. He vapes on cartrige of nicotine every 48 hours. He reports current alcohol use of about 4.0 standard drinks of alcohol per week. He reports that he does not use drugs.    Allergies  Allergen Reactions   Flonase  [Fluticasone  Propionate] Other (See Comments)    Epistaxis     Medications Prior to Admission  Medication Sig Dispense Refill   aspirin  EC 81 MG tablet Take 81 mg by mouth daily.     chlorthalidone  (HYGROTON ) 25 MG tablet TAKE (1/2) TABLET BY MOUTH ONCE DAILY. 45 tablet 2   Coenzyme Q10 (COQ10 PO) Take 1 capsule by mouth daily.     Cyanocobalamin  (VITAMIN B-12 PO) Take 1 tablet by mouth at bedtime.     ezetimibe  (ZETIA ) 10 MG tablet TAKE ONE TABLET BY MOUTH EVERY DAY 90 tablet 3   lisinopril  (ZESTRIL ) 20 MG tablet Take 1 tablet (20 mg total) by mouth daily. 90 tablet 3   methocarbamol  (ROBAXIN ) 500 MG tablet Take 1 tablet (500 mg total) by mouth 2 (two) times daily. 20 tablet 0   metoprolol  succinate (TOPROL -XL) 25 MG 24 hr tablet TAKE 1 TABLET BY MOUTH ONCE DAILY. (Patient taking differently: Take 25 mg by mouth at  bedtime.) 90 tablet 3   naproxen  (NAPROSYN ) 500 MG tablet Take 1 tablet (500 mg total) by mouth 2 (two) times daily. (Patient taking differently: Take 500 mg by mouth 2 (two) times daily as needed (pain).) 30 tablet 0   predniSONE  (STERAPRED UNI-PAK 21 TAB) 10 MG (21) TBPK tablet Take as package instructions. 1 each 0   rosuvastatin  (CRESTOR ) 40 MG tablet Take 1 tablet (40 mg total) by mouth daily. (Patient taking differently: Take 40 mg by mouth at bedtime.) 90 tablet 3   semaglutide -weight management (WEGOVY ) 0.5 MG/0.5ML SOAJ SQ injection Inject 0.5 mg into the skin every 7 (seven) days. (Patient taking differently: Inject 0.5 mg into the skin every Saturday.) 2 mL 0   traZODone  (DESYREL ) 50 MG tablet Take 0.5-1 tablets (25-50 mg total) by mouth at bedtime as needed for sleep. 30 tablet 3   nitroGLYCERIN  (  NITROSTAT ) 0.4 MG SL tablet PLACE 1 TAB UNDER TONGUE EVERY 5 MIN IF NEEDED FOR CHEST PAIN. MAY USE 3 TIMES.NO RELIEF CALL 911. (Patient not taking: Reported on 09/12/2023) 25 tablet 3     Home: Home Living Family/patient expects to be discharged to:: Private residence Living Arrangements: Spouse/significant other Available Help at Discharge: Family, Available 24 hours/day Type of Home: House Home Access: Stairs to enter [employer] of Steps: 2 Entrance Stairs-Rails: None Home Layout: Two level, Bed/bath upstairs Alternate Level Stairs-Number of Steps: flight Alternate Level Stairs-Rails: Right Bathroom Toilet: Standard Home Equipment: Rollator (4 wheels), Cane - single point, Crutches   Functional History: Prior Function Prior Level of Function : Independent/Modified Independent, Driving Mobility Comments: active, mowing the lawn ADLs Comments: Ind  Functional Status:  Mobility: Bed Mobility Overal bed mobility: Needs Assistance Bed Mobility: Rolling, Sidelying to Sit Rolling: Supervision Sidelying to sit: Supervision Sit to sidelying: Min assist General bed  mobility comments: progressing well with bed mobility, increased time needed Transfers Overall transfer level: Needs assistance Equipment used: Rolling walker (2 wheels) Transfers: Sit to/from Stand Sit to Stand: Min assist Bed to/from chair/wheelchair/BSC transfer type:: Via Lift equipment Step pivot transfers: Mod assist Transfer via Lift Equipment: Stedy General transfer comment: min A to stand from bed and recliner. vc's for hand placement, increased time and effort Ambulation/Gait Ambulation/Gait assistance: Mod assist, +2 safety/equipment Gait Distance (Feet): 60 Feet (+25') Assistive device: Rolling walker (2 wheels) Gait Pattern/deviations: Step-through pattern, Decreased stride length General Gait Details: Pt with heavy lean onto RW to the point that UE's shake with fatigue with increased distance. Worked on widening stance as pt with decreased proprioception and narrow stance. Needed seated rest in between bouts Gait velocity: decreased Gait velocity interpretation: <1.8 ft/sec, indicate of risk for recurrent falls Pre-gait activities: wt shifting and then stepping    ADL: ADL Overall ADL's : Needs assistance/impaired Eating/Feeding: Independent, Sitting Grooming: Wash/dry face, Set up, Sitting Grooming Details (indicate cue type and reason): Shave seated sinkside Upper Body Bathing: Supervision/ safety, Sitting Lower Body Bathing: Moderate assistance, Sitting/lateral leans Upper Body Dressing : Supervision/safety, Sitting Lower Body Dressing: Moderate assistance, Sitting/lateral leans Toilet Transfer: Moderate assistance, Regular Teacher, adult education Details (indicate cue type and reason): use of Stedy  Cognition: Cognition Orientation Level: Oriented X4 Cognition Arousal: Alert Behavior During Therapy: WFL for tasks assessed/performed   Blood pressure 118/67, pulse 82, temperature 97.9 F (36.6 C), temperature source Oral, resp. rate 16, height 5' 10 (1.778  m), weight 94.3 kg, SpO2 97%.   General: No apparent distress HEENT: Head is normocephalic, atraumatic, sclera anicteric, oral mucosa pink and moist, dentition intact Neck: Supple without JVD or lymphadenopathy Heart: Reg rate and rhythm. No murmurs rubs or gallops Chest: CTA bilaterally without wheezes,no increased work of breathing Abdomen: Soft, non-tender, non-distended, bowel sounds positive. Extremities: No clubbing, cyanosis, or edema. Pulses are 2+ Psych: Pt's affect is appropriate. Pt is cooperative Skin: Clean and intact without signs of breakdown, Back incision C/D with new honeycomb dressing in place Neuro:    Mental Status: AAOx4, memory intact, good insight and judgement  Speech/Languate: No speech or language deficits noted CRANIAL NERVES:2-12 intact  MOTOR: RUE: 4+/5 Deltoid, 5/5 Biceps, 5/5 Triceps,5/5 Grip LUE: 5/5 Deltoid, 5/5 Biceps, 5/5 Triceps, 5/5 Grip RLE: HF 4+/5, KE 4+/5, ADF 4+/5, APF 4+/5 LLE: HF 4+/5, KE 5/5, ADF 4+/5, APF 4+/5   REFLEXES: Hyperreflexive b/l knees  SENSORY: Decreased sensation b/l legs (most pronounced in lower legs, less so in  thighs) and around perineum  Coordination: Normal finger to nose intact b/l    No results found for this or any previous visit (from the past 48 hours). No results found.    Blood pressure 118/67, pulse 82, temperature 97.9 F (36.6 C), temperature source Oral, resp. rate 16, height 5' 10 (1.778 m), weight 94.3 kg, SpO2 97%.  Medical Problem List and Plan: 1. Functional deficits secondary to Cauda Equina syndrome/Lumbar Radiculoapthy s/p  L4-5 decompression and fusion by Dr. Mavis on 09/11/2023   -patient may shower, cover incision please  -ELOS/Goals: 7 days, PT/OT mod I to Sup  -Admit to CIR 2.  Antithrombotics:  -DVT/anticoagulation:  Mechanical: Sequential compression devices, below knee Bilateral lower extremities  -antiplatelet therapy: Will double check with NSGY regarding ASA restart 3.  Pain Management: Tylenol  prn. Oxycodone  prn. Continue Robaxin  500 mg BID--change to prn.  4. Mood/Behavior/Sleep: LCSW to follow for evaluation and support.   --Melatonin prn for insomnia.   -antipsychotic agents: N/A 5. Neuropsych/cognition: This patient is capable of making decisions on his own behalf. 6. Skin/Wound Care: Routine pressure relief measures.  7. Fluids/Electrolytes/Nutrition: Monitor I/O. Check CMET in am 8.  HTN: Monitor BP TID--on Zestril  and Toprol  XL. Will set decrease Zestril  to 10 mg and place hold parameters.  Monitor with activity.  --Blood pressures soft --will order orthostatic vitals. TEDs ordered. Binder prn.   9. CAD/ICM/chronic combined CHF: Monitor weight daily and for signs of overload.  --Heart healthy diet. On Toprol , Zestril , chorthalidone, Crestor . Off ASA due to surgery  --monitor for symptoms with increase in activity. 10. Abnormal LFTs: ALT-65/AST-90/TB-1.6 @ admission.   --follow up labs in am. 11. Leucocytosis: WBC up to 10.6 @ admission likely due to steroids. Will recheck in am.  --monitor for fevers and other signs of infection.  12 Neurogenic bowel: On Miralax  and colace-->had positive results with suppository yesterday --will start on bowel program (goes after each meal).   13. Neurogenic bladder?: On Flomax -->change to evenings to avoid hypotension.  Monitor PVR/Bladder scan 14. Pre-renal azotemia: BUN-34 @ admission. Encourage fluids. Recheck renal status in am.  15.  Pre-diabetes: Hgb A1C- 6.2 (08/02/23)--started on Wegovy  a few months ago.      Sharlet GORMAN Schmitz, PA-C 09/14/2023  I have personally performed a face to face diagnostic evaluation of this patient and formulated the key components of the plan.  Additionally, I have personally reviewed laboratory data, imaging studies, as well as relevant notes and concur with the physician assistant's documentation above.  The patient's status has not changed from the original H&P.  Any changes in  documentation from the acute care chart have been noted above.  Murray Collier, MD

## 2023-09-14 NOTE — Progress Notes (Addendum)
 Inpatient Rehab Admissions Coordinator:   Provided Dr. Naaman contact info for peer to peer if requested by [facility] MD.    1203: I have insurance approval and a bed available for pt to admit to CIR today.  Pt/family and TOC aware and in agreement to proceed.  Awaiting final clearance from Dr. Mavis.   1224: I received clearance from Gerard Beck, NP, and I will make arrangements for pt to admit to CIR today.   Reche Lowers, PT, DPT Admissions Coordinator [phone] 09/14/23  9:23 AM

## 2023-09-14 NOTE — Progress Notes (Signed)
 Patient arrived on unit late and got dinner tray late. Wanted to wait until after dinner to start bowel program. Passed on to oncoming nurse. Moved time to 2000.  [name]

## 2023-09-14 NOTE — Progress Notes (Signed)
 IP Rehab Bowel Program Documentation   Bowel Program Start time 7: 45 pm  Dig Stim Indicated? Yes  Dig Stim Prior to Suppository or mini Enema X 0   Output from dig stim: none  Ordered intervention: Suppository Yes , mini enema No   Repeat dig stim after Suppository or Mini enema  X 2,  Output? Small   Bowel Program Complete? Yes   Patient Tolerated? Yes ; suppository caused cramping and he went to bathroom to finish bm  -

## 2023-09-14 NOTE — Progress Notes (Signed)
 Physical Therapy Treatment Patient Details Name: Austin Bright MRN: [id number] DOB: 09-Mar-1945 Today's Date: 09/14/2023   History of Present Illness Pt is a 78 y.o. male admitted 8/23 with lumbar spinal stenosis, cauda equina syndrome. He emergently underwent PLIF. PMH:  MI, OA, CAD, CHF, HTN    PT Comments  Pt continuing to progress with therapy, is performing bed mobility at supervision level. Pt feels that sensation has continued to improve since session yesterday though still lacks proprioception BLE's which is evident with his gait pattern. He also continues to have significant weakness BLE's and is relying heavily on UE's on RW during gait. Ambulated 88' then needed seated rest, then 20'. Patient will benefit from intensive inpatient follow-up therapy, >3 hours/day. PT will continue to follow.      If plan is discharge home, recommend the following: A lot of help with walking and/or transfers;A lot of help with bathing/dressing/bathroom;Assist for transportation;Assistance with cooking/housework;Help with stairs or ramp for entrance   Can travel by private vehicle        Equipment Recommendations  Rolling walker (2 wheels);Wheelchair (measurements PT);Wheelchair cushion (measurements PT)    Recommendations for Other Services Rehab consult     Precautions / Restrictions Precautions Precautions: Back;Fall Precaution Booklet Issued: No Recall of Precautions/Restrictions: Intact Precaution/Restrictions Comments: Educated on 3/3 back precautions. Required Braces or Orthoses: Spinal Brace Spinal Brace: Lumbar corset;Applied in sitting position Restrictions Weight Bearing Restrictions Per Provider Order: No     Mobility  Bed Mobility Overal bed mobility: Needs Assistance Bed Mobility: Rolling, Sidelying to Sit Rolling: Supervision Sidelying to sit: Supervision       General bed mobility comments: progressing well with bed mobility, increased time needed     Transfers Overall transfer level: Needs assistance Equipment used: Rolling walker (2 wheels) Transfers: Sit to/from Stand Sit to Stand: Min assist           General transfer comment: min A to stand from bed and recliner. vc's for hand placement, increased time and effort    Ambulation/Gait Ambulation/Gait assistance: Mod assist, +2 safety/equipment Gait Distance (Feet): 60 Feet (+25') Assistive device: Rolling walker (2 wheels) Gait Pattern/deviations: Step-through pattern, Decreased stride length Gait velocity: decreased Gait velocity interpretation: <1.8 ft/sec, indicate of risk for recurrent falls   General Gait Details: Pt with heavy lean onto RW to the point that UE's shake with fatigue with increased distance. Worked on widening stance as pt with decreased proprioception and narrow stance. Needed seated rest in between bouts   Stairs             Wheelchair Mobility     Tilt Bed    Modified Rankin (Stroke Patients Only)       Balance Overall balance assessment: Needs assistance Sitting-balance support: Feet supported Sitting balance-Leahy Scale: Good     Standing balance support: Reliant on assistive device for balance, Bilateral upper extremity supported Standing balance-Leahy Scale: Poor Standing balance comment: needed mod A for dynamic balance                            Communication Communication Communication: No apparent difficulties  Cognition Arousal: Alert Behavior During Therapy: WFL for tasks assessed/performed   PT - Cognitive impairments: No apparent impairments                         Following commands: Intact      Cueing Cueing Techniques: Verbal cues  Exercises General Exercises - Lower Extremity Ankle Circles/Pumps: AROM, Both, 10 reps, Seated Long Arc Quad: AROM, Both, Seated, 5 reps (with 10 sec hold in ext)    General Comments General comments (skin integrity, edema, etc.): pt requires cues to  remind of breathing suring mobility, VSS. Wife present      Pertinent Vitals/Pain Pain Assessment Pain Assessment: Faces Faces Pain Scale: Hurts little more Pain Location: low back Pain Descriptors / Indicators: Discomfort, Aching, Operative site guarding Pain Intervention(s): Limited activity within patient's tolerance, Monitored during session    Home Living                          Prior Function            PT Goals (current goals can now be found in the care plan section) Acute Rehab PT Goals Patient Stated Goal: independence PT Goal Formulation: With patient/family Time For Goal Achievement: 09/26/23 Potential to Achieve Goals: Good Progress towards PT goals: Progressing toward goals    Frequency    Min 3X/week      PT Plan      Co-evaluation              AM-PAC PT 6 Clicks Mobility   Outcome Measure  Help needed turning from your back to your side while in a flat bed without using bedrails?: A Little Help needed moving from lying on your back to sitting on the side of a flat bed without using bedrails?: A Little Help needed moving to and from a bed to a chair (including a wheelchair)?: A Little Help needed standing up from a chair using your arms (e.g., wheelchair or bedside chair)?: A Little Help needed to walk in hospital room?: Total Help needed climbing 3-5 steps with a railing? : Total 6 Click Score: 14    End of Session Equipment Utilized During Treatment: Gait belt;Back brace Activity Tolerance: Patient tolerated treatment well Patient left: in chair;with call bell/phone within reach;with family/visitor present Nurse Communication: Mobility status PT Visit Diagnosis: Other abnormalities of gait and mobility (R26.89);Muscle weakness (generalized) (M62.81)     Time: 8762-8698 PT Time Calculation (min) (ACUTE ONLY): 24 min  Charges:    $Gait Training: 23-37 mins PT General Charges $$ ACUTE PT VISIT: 1 Visit                      Richerd Lipoma, PT  Acute Rehab Services Secure chat preferred Office [phone]    [place] L Rolande Moe 09/14/2023, 1:25 PM

## 2023-09-14 NOTE — Discharge Summary (Cosign Needed Addendum)
 Physician Discharge Summary     Providing Compassionate, Quality Care - Together   Patient ID: Austin Bright MRN: [id number] DOB/AGE: 77/09/1945 77 y.o.  Admit date: 09/11/2023 Discharge date: 09/14/2023  Admission Diagnoses: Cauda Equina Syndrome  Discharge Diagnoses:  Principal Problem:   Spinal stenosis Active Problems:   Cauda equina syndrome with neurogenic bladder [facility])   Discharged Condition: good  Hospital Course: Patient underwent an L4-5 decompression and fusion by Dr. Mavis on 09/11/2023. He was admitted to 5N01 following recovery from anesthesia in the PACU. His postoperative course has been uncomplicated. He has worked with both physical and occupational therapies who feel the patient is ready for discharge to [facility]. He is ambulating with assistance. He is tolerating a normal diet. He has voided since his Foley catheter was removed on 09/13/2023. His post void residual should be monitored over the next day or two. His pain is well-controlled with oral pain medication. He is ready for discharge to CIR.   Consults: PT/OT/TOC/Inpatient Rehab  Significant Diagnostic Studies: radiology: DG Lumbar Spine 1 View Result Date: 09/12/2023 CLINICAL DATA:  [id number] Surgery, elective [id number] EXAM: LUMBAR SPINE - 1 VIEW COMPARISON:  X-ray lumbar spine 09/11/2023 9:10 p.m., MRI lumbar spine 09/11/2023 FINDINGS: Intraoperative surgical hardware posterior to the L4 and L5 vertebral body. Atherosclerotic plaque. IMPRESSION: Surgical hardware posterior to the L4 and L5 vertebral body. Electronically Signed   By: Morgane  Naveau M.D.   On: 09/12/2023 01:02   DG Lumbar Spine 2-3 Views Result Date: 09/12/2023 CLINICAL DATA:  [id number] Surgery, elective [id number]; surgery Localization images done on portable for L4-L5 Spinal fusion by Dr. Mavis Reason for exam: Lumbar Four - Five Decompression, Instrumentation and Fusion x-tables EXAM: LUMBAR SPINE - 2-3 VIEW; DG C-ARM 1-60  MIN-NO REPORT COMPARISON:  None Available. FINDINGS: Intraoperative L4-L5 posterolateral and interbody surgical hardware. # low resolution intraoperative spot views of the lower lumbar spine were obtained. No fracture visible on the limited views. Total fluoroscopy time: 15.4 seconds Total radiation dose: 12.1 mGy IMPRESSION: Intraoperative L4-L5 posterolateral and interbody surgical hardware. Electronically Signed   By: Morgane  Naveau M.D.   On: 09/12/2023 00:56   DG C-Arm 1-60 Min-No Report Result Date: 09/11/2023 Fluoroscopy was utilized by the requesting physician.  No radiographic interpretation.   DG C-Arm 1-60 Min-No Report Result Date: 09/11/2023 Fluoroscopy was utilized by the requesting physician.  No radiographic interpretation.   MR LUMBAR SPINE WO CONTRAST Result Date: 09/11/2023 CLINICAL DATA:  78 year old male with back pain. EXAM: MRI LUMBAR SPINE WITHOUT CONTRAST TECHNIQUE: Multiplanar, multisequence MR imaging of the lumbar spine was performed. No intravenous contrast was administered. COMPARISON:  Lumbar radiographs 2 days ago. FINDINGS: Segmentation:  Normal on the comparison. Alignment: Stable lumbar lordosis. No significant scoliosis or spondylolisthesis. Vertebrae: Normal background bone marrow signal. Maintained vertebral height. Intact visible sacrum and SI joints. No marrow edema or evidence of acute osseous abnormality. Conus medullaris and cauda equina: Conus extends to the L1 level. No lower spinal cord or conus signal abnormality. Paraspinal and other soft tissues: Calcified aortic atherosclerosis better demonstrated on the comparison. Negative other Visualized abdominal viscera and paraspinal soft tissues. Distal large bowel diverticulosis in the pelvis. Disc levels: Negative for age visible lower thoracic levels through L1-L2. L2-L3: Disc space loss. Circumferential disc osteophyte complex asymmetric to the left. Epidural lipomatosis and mild to moderate posterior element  hypertrophy. Moderate spinal stenosis (series 8, image 17). Mild to moderate left lateral recess stenosis. Moderate bilateral L2 foraminal stenosis.  L3-L4: Severe multifactorial spinal stenosis (series 8, image 23) related to similar circumferential disc bulge asymmetric to the right, epidural lipomatosis, moderate to severe facet and ligament flavum hypertrophy. Degenerative facet joint fluid. Mild bilateral L3 neural foraminal stenosis greater on the right. L4-L5: Better maintained disc space but large central or left paracentral disc extrusion into the spinal canal resulting in severe or very severe stenosis (series 8, image 28 and series 5, image 9). Superimposed mild epidural lipomatosis and moderate facet and ligament flavum hypertrophy. Lateral recess stenosis may be greater on the left (L5 nerve level). Mild L4 foraminal stenosis. L5-S1: Negative disc. Mild to moderate facet hypertrophy greater on the right. Trace facet joint fluid on that side. No stenosis. IMPRESSION: 1. Bulky disc extrusion at L4-L5 with very Severe spinal stenosis. Lateral recess stenosis may be greater on the left. Query L5 radiculitis. 2. Superimposed multifactorial Severe spinal stenosis also at L3-L4, and moderate spinal stenosis at L2-L3. Up to moderate bilateral L2 neural foraminal stenosis. Electronically Signed   By: VEAR Hurst M.D.   On: 09/11/2023 11:35     Treatments: surgery: Bilateral L4-5 laminotomy/foraminotomies/medial facetectomy to decompress the bilateral L4 and L5 nerve roots(the work required to do this was in addition to the work required to do the posterior lumbar interbody fusion because of the patient's spinal stenosis, large central herniated disc facet arthropathy. Etc. requiring a wide decompression of the nerve roots.); left L4-5 transforaminal lumbar interbody fusion with local morselized autograft bone and Zimmer DBM; insertion of interbody prosthesis at L4-5 (globus peek expandable interbody prosthesis);  posterior nonsegmental instrumentation from L4 to L5 with globus titanium pedicle screws and rods; posterior lateral arthrodesis at L4-5 with local morselized autograft bone and Zimmer DBM.   Discharge Exam: Blood pressure 118/67, pulse 82, temperature 97.9 F (36.6 C), temperature source Oral, resp. rate 16, height 5' 10 (1.778 m), weight 94.3 kg, SpO2 97%.  Per report: Alert and oriented x 4 PERRLA CN II-XII grossly intact MAE, Strength intact Incision is covered with Honeycomb dressing and Steri Strips; Dressing is clean, dry, and intact   Disposition: Discharge disposition: [facility]-Another [facility] Care Institution Not Defined       Discharge Instructions      Remove dressing in 72 hours   Complete by: As directed    Call MD for:  difficulty breathing, headache or visual disturbances   Complete by: As directed    Call MD for:  hives   Complete by: As directed    Call MD for:  persistant nausea and vomiting   Complete by: As directed    Call MD for:  redness, tenderness, or signs of infection (pain, swelling, redness, odor or green/yellow discharge around incision site)   Complete by: As directed    Call MD for:  severe uncontrolled pain   Complete by: As directed    Diet - low sodium heart healthy   Complete by: As directed    Increase activity slowly   Complete by: As directed       Allergies as of 09/14/2023       Reactions   Flonase  [fluticasone  Propionate] Other (See Comments)   Epistaxis         Medication List     STOP taking these medications    naproxen  500 MG tablet Commonly known as: NAPROSYN    predniSONE  10 MG (21) Tbpk tablet Commonly known as: STERAPRED UNI-PAK 21 TAB       TAKE these medications    acetaminophen   325 MG tablet Commonly known as: TYLENOL  Take 2 tablets (650 mg total) by mouth every 4 (four) hours as needed for mild pain (pain score 1-3) (or temp > 100.5).   aspirin  EC 81 MG tablet Take 81 mg by mouth daily.    chlorthalidone  25 MG tablet Commonly known as: HYGROTON  TAKE (1/2) TABLET BY MOUTH ONCE DAILY.   COQ10 PO Take 1 capsule by mouth daily.   ezetimibe  10 MG tablet Commonly known as: ZETIA  TAKE ONE TABLET BY MOUTH EVERY DAY   lisinopril  20 MG tablet Commonly known as: ZESTRIL  Take 1 tablet (20 mg total) by mouth daily.   methocarbamol  500 MG tablet Commonly known as: ROBAXIN  Take 1 tablet (500 mg total) by mouth 2 (two) times daily.   metoprolol  succinate 25 MG 24 hr tablet Commonly known as: TOPROL -XL TAKE 1 TABLET BY MOUTH ONCE DAILY. What changed: when to take this   mupirocin  ointment 2 % Commonly known as: BACTROBAN  Place 1 Application into the nose 2 (two) times daily.   nitroGLYCERIN  0.4 MG SL tablet Commonly known as: NITROSTAT  PLACE 1 TAB UNDER TONGUE EVERY 5 MIN IF NEEDED FOR CHEST PAIN. MAY USE 3 TIMES.NO RELIEF CALL 911.   polyethylene glycol 17 g packet Commonly known as: MIRALAX  / GLYCOLAX  Take 17 g by mouth daily. Start taking on: September 15, 2023   rosuvastatin  40 MG tablet Commonly known as: CRESTOR  Take 1 tablet (40 mg total) by mouth daily. What changed: when to take this   tamsulosin  0.4 MG Caps capsule Commonly known as: FLOMAX  Take 1 capsule (0.4 mg total) by mouth daily after breakfast. Start taking on: September 15, 2023   traZODone  50 MG tablet Commonly known as: DESYREL  Take 0.5-1 tablets (25-50 mg total) by mouth at bedtime as needed for sleep.   VITAMIN B-12 PO Take 1 tablet by mouth at bedtime.   Wegovy  0.5 MG/0.5ML Soaj SQ injection Generic drug: semaglutide -weight management Inject 0.5 mg into the skin every 7 (seven) days. What changed: when to take this        Follow-up Information     [name], MD. Schedule an appointment as soon as possible for a visit in 4 week(s).   Specialty: Neurosurgery Contact information: [address] [place] [address] [phone]                  Signed: Gerard Beck, DNP, AGNP-C Nurse Practitioner  [facility] [address], [address], [place], [place] [address] P: [phone]    F: [phone]  09/14/2023, 12:28 PM

## 2023-09-14 NOTE — Discharge Instructions (Signed)

## 2023-09-14 NOTE — H&P (Signed)
 Physical Medicine and Rehabilitation Admission H&P        Chief Complaint  Patient presents with   Cauda equina syndrome.       HPI:  Austin Bright is a 78 year old male with history of CAD/ICM with combined CHF, HTN, gout, new onset of LBP who was  seen in ED 09/09/23 and treated with dose of decadron  and discharged to home with pain meds and muscle relaxer due to likely lumbar strain. He presented to ED on 09/11/23 with reports of progressive pain, inability to walk, perineal numbness and urinary retention with inability to void. MRI lumbar spine revealed severe spinal stenosis L3/L4, L4/L5, bulky disc extrusion L4-L5 and moderate spinal stenosis L2/L3. Bladder scanned for 480 cc and foley placed for cauda equina syndrome.  He was taken to OR emergently for bilateral L4 and L5 laminectomy for decompression of nerve roots and interbody fusion by Dr. Mavis on the same day.  Post op LSO to be donned at EOB. Sensation BLE improving but he continues to have bilateral knee instability, foley removed yesterday and reported to be voiding.  Reports he feels bladder function improving and reports he has been continent. Reports continent bowel movement today. Reports sensation was intact during BM.    Hx of Rotator cuff tear R shoulder. Treated non-surgically.   Reports soreness in his lower back but overall pain is controlled. Continued altered sensation in his lower legs, feet and groin.    PT/OT has been working with patient who requires min to mod assist with mobility and min assist with ADL tasks. 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 CIR recommended due to functional decline.    Review of Systems  Constitutional:  Negative for chills and fever.  HENT:  Negative for hearing loss and tinnitus.   Eyes:  Negative for blurred vision.  Respiratory:  Negative for cough and shortness of breath.   Cardiovascular:  Negative  for chest pain and palpitations.  Gastrointestinal:  Positive for constipation. Negative for abdominal pain, diarrhea, heartburn and vomiting.  Genitourinary:  Positive for dysuria.  Musculoskeletal:  Positive for back pain.       R-RTC tear managed with conservative measures.  Skin:  Negative for rash.  Neurological:  Positive for sensory change and focal weakness. Negative for dizziness and headaches.  Psychiatric/Behavioral:  Negative for memory loss. The patient has insomnia (uses CBD gummies--gets 6 hours of sleep on average).            Past Medical History:  Diagnosis Date   Acute diverticulitis 08/17/2020   Acute MI (HCC) 08/2011   Arthritis     Bilateral pneumonia      Diagnosed after STEMI 08/2011   CAD (coronary artery disease)      a. Diagnosed 08/2011 with anterior STEMI due to thrombotic occlusion of mid LAD s/p thrombectomy, PTCA, DES placement 08/23/11.   CHF (congestive heart failure) (HCC)     Chronic combined systolic and diastolic congestive heart failure (HCC) 09/15/2011   Dyslipidemia     Gout     Hypertension     Ischemic cardiomyopathy      a. Initial EF 35% by cath 08/23/11, improved to 40-45% by echo 08/25/11. 50-55% by echo November 2013.   Rotator cuff tear arthropathy, right     Shortness of breath  Past Surgical History:  Procedure Laterality Date   carpel tunnel Bilateral 2002   COLONOSCOPY WITH PROPOFOL  N/A 04/08/2017    Surgeon: [name], MD;  diverticulosis in sigmoid and descending colon, internal hemorrhoids, otherwise normal exam.  No recommendations to repeat due to age.   COLONOSCOPY WITH PROPOFOL  N/A 03/17/2021    Procedure: COLONOSCOPY WITH PROPOFOL ;  Surgeon: [name], DO;  Location: [facility];  Service: Endoscopy;  Laterality: N/A;  8:00am   CORONARY STENT PLACEMENT       CYSTECTOMY   1969    pilonidal cyst   LEFT AND RIGHT HEART CATHETERIZATION WITH CORONARY ANGIOGRAM N/A 09/15/2011    Procedure: LEFT  AND RIGHT HEART CATHETERIZATION WITH CORONARY ANGIOGRAM;  Surgeon: [name], MD;  Location: [facility] CATH LAB;  Service: Cardiovascular;  Laterality: N/A;   LEFT HEART CATH N/A 08/23/2011    Procedure: LEFT HEART CATH;  Surgeon: [name], MD;  Location: [facility] CATH LAB;  Service: Cardiovascular;  Laterality: N/A;   NO PAST SURGERIES       PERCUTANEOUS CORONARY STENT INTERVENTION (PCI-S)   08/23/2011    Procedure: PERCUTANEOUS CORONARY STENT INTERVENTION (PCI-S);  Surgeon: [name], MD;  Location: [facility] CATH LAB;  Service: Cardiovascular;;               Family History  Problem Relation Age of Onset   Stroke Mother     Hypertension Father     Colon cancer Neg Hx     Pancreatic cancer Neg Hx            Social History:  Married. Independent prior to back issues. He  reports that he quit smoking about 44 years ago. His smoking use included cigarettes. He started smoking about 69 years ago. He has a 25 pack-year smoking history. He vapes on cartrige of nicotine every 48 hours. He reports current alcohol use of about 4.0 standard drinks of alcohol per week. He reports that he does not use drugs.     Allergies       Allergies  Allergen Reactions   Flonase  [Fluticasone  Propionate] Other (See Comments)      Epistaxis               Medications Prior to Admission  Medication Sig Dispense Refill   aspirin  EC 81 MG tablet Take 81 mg by mouth daily.       chlorthalidone  (HYGROTON ) 25 MG tablet TAKE (1/2) TABLET BY MOUTH ONCE DAILY. 45 tablet 2   Coenzyme Q10 (COQ10 PO) Take 1 capsule by mouth daily.       Cyanocobalamin  (VITAMIN B-12 PO) Take 1 tablet by mouth at bedtime.       ezetimibe  (ZETIA ) 10 MG tablet TAKE ONE TABLET BY MOUTH EVERY DAY 90 tablet 3   lisinopril  (ZESTRIL ) 20 MG tablet Take 1 tablet (20 mg total) by mouth daily. 90 tablet 3   methocarbamol  (ROBAXIN ) 500 MG tablet Take 1 tablet (500 mg total) by mouth 2 (two) times daily. 20 tablet 0   metoprolol  succinate  (TOPROL -XL) 25 MG 24 hr tablet TAKE 1 TABLET BY MOUTH ONCE DAILY. (Patient taking differently: Take 25 mg by mouth at bedtime.) 90 tablet 3   naproxen  (NAPROSYN ) 500 MG tablet Take 1 tablet (500 mg total) by mouth 2 (two) times daily. (Patient taking differently: Take 500 mg by mouth 2 (two) times daily as needed (pain).) 30 tablet 0   predniSONE  (STERAPRED UNI-PAK 21 TAB) 10 MG (21)  TBPK tablet Take as package instructions. 1 each 0   rosuvastatin  (CRESTOR ) 40 MG tablet Take 1 tablet (40 mg total) by mouth daily. (Patient taking differently: Take 40 mg by mouth at bedtime.) 90 tablet 3   semaglutide -weight management (WEGOVY ) 0.5 MG/0.5ML SOAJ SQ injection Inject 0.5 mg into the skin every 7 (seven) days. (Patient taking differently: Inject 0.5 mg into the skin every Saturday.) 2 mL 0   traZODone  (DESYREL ) 50 MG tablet Take 0.5-1 tablets (25-50 mg total) by mouth at bedtime as needed for sleep. 30 tablet 3   nitroGLYCERIN  (NITROSTAT ) 0.4 MG SL tablet PLACE 1 TAB UNDER TONGUE EVERY 5 MIN IF NEEDED FOR CHEST PAIN. MAY USE 3 TIMES.NO RELIEF CALL 911. (Patient not taking: Reported on 09/12/2023) 25 tablet 3            Home: Home Living Family/patient expects to be discharged to:: Private residence Living Arrangements: Spouse/significant other Available Help at Discharge: Family, Available 24 hours/day Type of Home: House Home Access: Stairs to enter [employer] of Steps: 2 Entrance Stairs-Rails: None Home Layout: Two level, Bed/bath upstairs Alternate Level Stairs-Number of Steps: flight Alternate Level Stairs-Rails: Right Bathroom Toilet: Standard Home Equipment: Rollator (4 wheels), Cane - single point, Crutches   Functional History: Prior Function Prior Level of Function : Independent/Modified Independent, Driving Mobility Comments: active, mowing the lawn ADLs Comments: Ind   Functional Status:  Mobility: Bed Mobility Overal bed mobility: Needs Assistance Bed Mobility:  Rolling, Sidelying to Sit Rolling: Supervision Sidelying to sit: Supervision Sit to sidelying: Min assist General bed mobility comments: progressing well with bed mobility, increased time needed Transfers Overall transfer level: Needs assistance Equipment used: Rolling walker (2 wheels) Transfers: Sit to/from Stand Sit to Stand: Min assist Bed to/from chair/wheelchair/BSC transfer type:: Via Lift equipment Step pivot transfers: Mod assist Transfer via Lift Equipment: Stedy General transfer comment: min A to stand from bed and recliner. vc's for hand placement, increased time and effort Ambulation/Gait Ambulation/Gait assistance: Mod assist, +2 safety/equipment Gait Distance (Feet): 60 Feet (+25') Assistive device: Rolling walker (2 wheels) Gait Pattern/deviations: Step-through pattern, Decreased stride length General Gait Details: Pt with heavy lean onto RW to the point that UE's shake with fatigue with increased distance. Worked on widening stance as pt with decreased proprioception and narrow stance. Needed seated rest in between bouts Gait velocity: decreased Gait velocity interpretation: <1.8 ft/sec, indicate of risk for recurrent falls Pre-gait activities: wt shifting and then stepping   ADL: ADL Overall ADL's : Needs assistance/impaired Eating/Feeding: Independent, Sitting Grooming: Wash/dry face, Set up, Sitting Grooming Details (indicate cue type and reason): Shave seated sinkside Upper Body Bathing: Supervision/ safety, Sitting Lower Body Bathing: Moderate assistance, Sitting/lateral leans Upper Body Dressing : Supervision/safety, Sitting Lower Body Dressing: Moderate assistance, Sitting/lateral leans Toilet Transfer: Moderate assistance, Regular Teacher, adult education Details (indicate cue type and reason): use of Stedy   Cognition: Cognition Orientation Level: Oriented X4 Cognition Arousal: Alert Behavior During Therapy: WFL for tasks assessed/performed      Blood pressure 118/67, pulse 82, temperature 97.9 F (36.6 C), temperature source Oral, resp. rate 16, height 5' 10 (1.778 m), weight 94.3 kg, SpO2 97%.     General: No apparent distress HEENT: Head is normocephalic, atraumatic, sclera anicteric, oral mucosa pink and moist, dentition intact Neck: Supple without JVD or lymphadenopathy Heart: Reg rate and rhythm. No murmurs rubs or gallops Chest: CTA bilaterally without wheezes,no increased work of breathing Abdomen: Soft, non-tender, non-distended, bowel sounds positive. Extremities: No clubbing, cyanosis,  or edema. Pulses are 2+ Psych: Pt's affect is appropriate. Pt is cooperative Skin: Clean and intact without signs of breakdown, Back incision C/D with new honeycomb dressing in place Neuro:     Mental Status: AAOx4, memory intact, good insight and judgement  Speech/Languate: No speech or language deficits noted CRANIAL NERVES:2-12 intact   MOTOR: RUE: 4+/5 Deltoid, 5/5 Biceps, 5/5 Triceps,5/5 Grip LUE: 5/5 Deltoid, 5/5 Biceps, 5/5 Triceps, 5/5 Grip RLE: HF 4+/5, KE 4+/5, ADF 4+/5, APF 4+/5 LLE: HF 4+/5, KE 5/5, ADF 4+/5, APF 4+/5     REFLEXES: Hyperreflexive b/l knees   SENSORY: Decreased sensation b/l legs (most pronounced in lower legs, less so in thighs) and around perineum   Coordination: Normal finger to nose intact b/l       Lab Results Last 48 Hours  No results found for this or any previous visit (from the past 48 hours).   Imaging Results (Last 48 hours)  No results found.         Blood pressure 118/67, pulse 82, temperature 97.9 F (36.6 C), temperature source Oral, resp. rate 16, height 5' 10 (1.778 m), weight 94.3 kg, SpO2 97%.   Medical Problem List and Plan: 1. Functional deficits secondary to Cauda Equina syndrome/Lumbar Radiculoapthy s/p  L4-5 decompression and fusion by Dr. Mavis on 09/11/2023              -patient may shower, cover incision please             -ELOS/Goals: 7 days, PT/OT mod I  to Sup             -Admit to CIR 2.  Antithrombotics:  -DVT/anticoagulation:  Mechanical: Sequential compression devices, below knee Bilateral lower extremities             -antiplatelet therapy: Will double check with NSGY regarding ASA restart 3. Pain Management: Tylenol  prn. Oxycodone  prn. Continue Robaxin  500 mg BID--change to prn.  4. Mood/Behavior/Sleep: LCSW to follow for evaluation and support.              --Melatonin prn for insomnia.              -antipsychotic agents: N/A 5. Neuropsych/cognition: This patient is capable of making decisions on his own behalf. 6. Skin/Wound Care: Routine pressure relief measures.  7. Fluids/Electrolytes/Nutrition: Monitor I/O. Check CMET in am 8.  HTN: Monitor BP TID--on Zestril  and Toprol  XL. Will set decrease Zestril  to 10 mg and place hold parameters.  Monitor with activity.  --Blood pressures soft --will order orthostatic vitals. TEDs ordered. Binder prn.   9. CAD/ICM/chronic combined CHF: Monitor weight daily and for signs of overload.             --Heart healthy diet. On Toprol , Zestril , chorthalidone, Crestor . Off ASA due to surgery             --monitor for symptoms with increase in activity. 10. Abnormal LFTs: ALT-65/AST-90/TB-1.6 @ admission.              --follow up labs in am. 11. Leucocytosis: WBC up to 10.6 @ admission likely due to steroids. Will recheck in am.             --monitor for fevers and other signs of infection.  12 Neurogenic bowel: On Miralax  and colace-->had positive results with suppository yesterday --will start on bowel program (goes after each meal).   13. Neurogenic bladder?: On Flomax -->change to evenings to avoid hypotension.  Monitor PVR/Bladder scan 14. Pre-renal  azotemia: BUN-34 @ admission. Encourage fluids. Recheck renal status in am.  15.  Pre-diabetes: Hgb A1C- 6.2 (08/02/23)--started on Wegovy  a few months ago.          Sharlet GORMAN Schmitz, PA-C 09/14/2023   I have personally performed a face to face  diagnostic evaluation of this patient and formulated the key components of the plan.  Additionally, I have personally reviewed laboratory data, imaging studies, as well as relevant notes and concur with the physician assistant's documentation above.   The patient's status has not changed from the original H&P.  Any changes in documentation from the acute care chart have been noted above.   Murray Collier, MD

## 2023-09-14 NOTE — Progress Notes (Signed)
 Patient ID: Austin Bright, male   DOB: 03/10/1945, 78 y.o.   MRN: [id number] Subjective: The patient is alert and pleasant.  He complains that his legs feel weak when he ambulates.  He was able to urinate.  He feels he is emptying his bladder.  Objective: Vital signs in last 24 hours: Temp:  [97.9 F (36.6 C)-98.7 F (37.1 C)] 97.9 F (36.6 C) (08/26 0733) Pulse Rate:  [70-83] 82 (08/26 0733) Resp:  [16-18] 16 (08/26 0733) BP: (95-122)/(60-72) 118/67 (08/26 0733) SpO2:  [95 %-99 %] 97 % (08/26 0733) Estimated body mass index is 29.84 kg/m as calculated from the following:   Height as of this encounter: 5' 10 (1.778 m).   Weight as of this encounter: 94.3 kg.   Intake/Output from previous day: 08/25 0701 - 08/26 0700 In: 120 [P.O.:120] Out: 1500 [Urine:1500] Intake/Output this shift: No intake/output data recorded.  Physical exam the patient is alert and oriented.  His strength is grossly normal in his lower extremities.  His dressing is in tatters.  Lab Results: Recent Labs    09/11/23 1051  WBC 10.6*  HGB 16.7  HCT 48.0  PLT 182   BMET Recent Labs    09/11/23 1051  NA 139  K 3.5  CL 95*  CO2 27  GLUCOSE 136*  BUN 34*  CREATININE 1.23  CALCIUM  9.9    Studies/Results: No results found.  Assessment/Plan: Postop day #3: The patient is doing well well.  It looks like he will need rehab because he does not feel like he is able to walk safely.  We will check a postvoid residual bladder scan.  We will replace the patient's lumbar dressing.  LOS: 3 days     Austin Bright 09/14/2023, 7:48 AM

## 2023-09-15 ENCOUNTER — Inpatient Hospital Stay (HOSPITAL_COMMUNITY)

## 2023-09-15 DIAGNOSIS — M623 Immobility syndrome (paraplegic): Secondary | ICD-10-CM

## 2023-09-15 DIAGNOSIS — R7401 Elevation of levels of liver transaminase levels: Secondary | ICD-10-CM

## 2023-09-15 DIAGNOSIS — K592 Neurogenic bowel, not elsewhere classified: Secondary | ICD-10-CM | POA: Diagnosis not present

## 2023-09-15 DIAGNOSIS — R7989 Other specified abnormal findings of blood chemistry: Secondary | ICD-10-CM | POA: Diagnosis not present

## 2023-09-15 DIAGNOSIS — G834 Cauda equina syndrome: Secondary | ICD-10-CM | POA: Diagnosis not present

## 2023-09-15 LAB — COMPREHENSIVE METABOLIC PANEL WITH GFR
ALT: 110 U/L — ABNORMAL HIGH (ref 0–44)
AST: 101 U/L — ABNORMAL HIGH (ref 15–41)
Albumin: 3.3 g/dL — ABNORMAL LOW (ref 3.5–5.0)
Alkaline Phosphatase: 48 U/L (ref 38–126)
Anion gap: 12 (ref 5–15)
BUN: 33 mg/dL — ABNORMAL HIGH (ref 8–23)
CO2: 27 mmol/L (ref 22–32)
Calcium: 8.8 mg/dL — ABNORMAL LOW (ref 8.9–10.3)
Chloride: 97 mmol/L — ABNORMAL LOW (ref 98–111)
Creatinine, Ser: 1.32 mg/dL — ABNORMAL HIGH (ref 0.61–1.24)
GFR, Estimated: 56 mL/min — ABNORMAL LOW (ref >60)
Glucose, Bld: 117 mg/dL — ABNORMAL HIGH (ref 70–99)
Potassium: 3.5 mmol/L (ref 3.5–5.1)
Sodium: 136 mmol/L (ref 135–145)
Total Bilirubin: 1 mg/dL (ref 0.0–1.2)
Total Protein: 6.1 g/dL — ABNORMAL LOW (ref 6.5–8.1)

## 2023-09-15 LAB — CBC WITH DIFFERENTIAL/PLATELET
Abs Immature Granulocytes: 0.05 K/uL (ref 0.00–0.07)
Basophils Absolute: 0 K/uL (ref 0.0–0.1)
Basophils Relative: 0 %
Eosinophils Absolute: 0.1 K/uL (ref 0.0–0.5)
Eosinophils Relative: 1 %
HCT: 41.1 % (ref 39.0–52.0)
Hemoglobin: 14.7 g/dL (ref 13.0–17.0)
Immature Granulocytes: 1 %
Lymphocytes Relative: 21 %
Lymphs Abs: 2.2 K/uL (ref 0.7–4.0)
MCH: 32 pg (ref 26.0–34.0)
MCHC: 35.8 g/dL (ref 30.0–36.0)
MCV: 89.5 fL (ref 80.0–100.0)
Monocytes Absolute: 1.1 K/uL — ABNORMAL HIGH (ref 0.1–1.0)
Monocytes Relative: 11 %
Neutro Abs: 6.8 K/uL (ref 1.7–7.7)
Neutrophils Relative %: 66 %
Platelets: 170 K/uL (ref 150–400)
RBC: 4.59 MIL/uL (ref 4.22–5.81)
RDW: 13.2 % (ref 11.5–15.5)
WBC: 10.2 K/uL (ref 4.0–10.5)
nRBC: 0 % (ref 0.0–0.2)

## 2023-09-15 MED ORDER — ENOXAPARIN SODIUM 40 MG/0.4ML IJ SOSY
40.0000 mg | PREFILLED_SYRINGE | INTRAMUSCULAR | Status: DC
  Administered 2023-09-15 – 2023-09-22 (×8): 40 mg via SUBCUTANEOUS
  Filled 2023-09-15 (×8): qty 0.4

## 2023-09-15 NOTE — Progress Notes (Signed)
 Inpatient Rehabilitation Care Coordinator Assessment and Plan Patient Details  Name: Austin Bright MRN: [id number] Date of Birth: 06/29/1945  Today's Date: 09/15/2023  Hospital Problems: Principal Problem:   Cauda equina compression [facility])  Past Medical History:  Past Medical History:  Diagnosis Date   Acute diverticulitis 08/17/2020   Acute MI (HCC) 08/2011   Arthritis    Bilateral pneumonia    Diagnosed after STEMI 08/2011   CAD (coronary artery disease)    a. Diagnosed 08/2011 with anterior STEMI due to thrombotic occlusion of mid LAD s/p thrombectomy, PTCA, DES placement 08/23/11.   CHF (congestive heart failure) (HCC)    Chronic combined systolic and diastolic congestive heart failure (HCC) 09/15/2011   Dyslipidemia    Gout    Hypertension    Ischemic cardiomyopathy    a. Initial EF 35% by cath 08/23/11, improved to 40-45% by echo 08/25/11. 50-55% by echo November 2013.   Rotator cuff tear arthropathy, right    Shortness of breath    Past Surgical History:  Past Surgical History:  Procedure Laterality Date   carpel tunnel Bilateral 2002   COLONOSCOPY WITH PROPOFOL  N/A 04/08/2017   Surgeon: [name], MD;  diverticulosis in sigmoid and descending colon, internal hemorrhoids, otherwise normal exam.  No recommendations to repeat due to age.   COLONOSCOPY WITH PROPOFOL  N/A 03/17/2021   Procedure: COLONOSCOPY WITH PROPOFOL ;  Surgeon: [name], DO;  Location: [facility];  Service: Endoscopy;  Laterality: N/A;  8:00am   CORONARY STENT PLACEMENT     CYSTECTOMY  1969   pilonidal cyst   LEFT AND RIGHT HEART CATHETERIZATION WITH CORONARY ANGIOGRAM N/A 09/15/2011   Procedure: LEFT AND RIGHT HEART CATHETERIZATION WITH CORONARY ANGIOGRAM;  Surgeon: [name], MD;  Location: [facility] CATH LAB;  Service: Cardiovascular;  Laterality: N/A;   LEFT HEART CATH N/A 08/23/2011   Procedure: LEFT HEART CATH;  Surgeon: [name], MD;  Location: [facility] CATH LAB;  Service:  Cardiovascular;  Laterality: N/A;   NO PAST SURGERIES     PERCUTANEOUS CORONARY STENT INTERVENTION (PCI-S)  08/23/2011   Procedure: PERCUTANEOUS CORONARY STENT INTERVENTION (PCI-S);  Surgeon: [name], MD;  Location: [facility] CATH LAB;  Service: Cardiovascular;;   Social History:  reports that he quit smoking about 44 years ago. His smoking use included cigarettes. He started smoking about 69 years ago. He has a 25 pack-year smoking history. He has never used smokeless tobacco. He reports current alcohol use of about 4.0 standard drinks of alcohol per week. He reports that he does not use drugs.  Family / Support Systems Marital Status: Married Patient Roles: Spouse, Parent Spouse/Significant Other: Austin Bright [phone] Children: Austin Bright-daughter [phone] Other Supports: Friends Anticipated Caregiver: Wife Ability/Limitations of Caregiver: Wife can take off of work if needed for transition back home Caregiver Availability: 24/7 Family Dynamics: Close with wife and daughter he has always been independent and taken care of himself and hopes to be able to do so again  Social History Preferred language: English Religion: None Cultural Background: NA Education: Charity fundraiser - How often do you need to have someone help you when you read instructions, pamphlets, or other written material from your doctor or pharmacy?: Never Writes: Yes Employment Status: Employed 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 Return to Work Plans: Depends upon his progress and recovery here Marine scientist Issues: NA Guardian/Conservator: None-according to MD pt is capab;le of making his pown decisions while here   Abuse/Neglect Abuse/Neglect Assessment Can  Be Completed: Yes Physical Abuse: Denies Verbal Abuse: Denies Sexual Abuse: Denies Exploitation of patient/patient's resources: Denies Self-Neglect: Denies  Patient response to: Social Isolation - How often do you feel  lonely or isolated from those around you?: Never  Emotional Status Pt's affect, behavior and adjustment status: Pt is motivated after today's therapies, he wants to recover and return to his independent level. He was working prior to admission as a Ship broker. Recent Psychosocial Issues: other health issues were managed prior to admission Psychiatric History: No history seems to be able to verbalize his concerns and is optimistic regarding his recovery Substance Abuse History: NA  Patient / Family Perceptions, Expectations & Goals Pt/Family understanding of illness & functional limitations: Pt and wife can explain his surgery and talks with the MD's involved. Both feel they understand his treatment plan going forward and have gotten their questions answered. Premorbid pt/family roles/activities: husband, father, Catering manager, etc Anticipated changes in roles/activities/participation: resume Pt/family expectations/goals: Pt states:  I hope to be able to recover and get back to my independent level before I go home.  Wife states:  I have seen him in therapies today and he is doing better.  Community [employer] Agencies: None Premorbid Home Care/DME Agencies: Other (Comment) (rollator, cane crutches) Transportation available at discharge: wife Is the patient able to respond to transportation needs?: Yes In the past 12 months, has lack of transportation kept you from medical appointments or from getting medications?: No In the past 12 months, has lack of transportation kept you from meetings, work, or from getting things needed for daily living?: No  Discharge Planning Living Arrangements: Spouse/significant other Support Systems: Spouse/significant other, Children, Friends/neighbors Type of Residence: Private residence Insurance Resources: Media planner (specify) ([employer] Medicare) Financial Resources: Employment, Garment/textile technologist  Screen Referred: No Living Expenses: Lives with family Money Management: Patient, Spouse Does the patient have any problems obtaining your medications?: No Home Management: both Patient/Family Preliminary Plans: Return home with wife who can take off a few weeks from work to help with the transition home when ready for discharge. Made aware being evaluated and goals being set for stay here and team conference on Tuesday. Care Coordinator Barriers to Discharge: Insurance for SNF coverage Care Coordinator Anticipated Follow Up Needs: HH/OP  Clinical Impression Pleasant gentleman who is motivated to recover from his surgery and regain his independence. Hi wife has been here today and observed in therapies for evaluations. She can take off two weeks for the transition to home from the hospital. Aware team conference on Tuesday and primary social worker is Auria who is off until Monday. Available to answer questions until that time  Raymonde Asberry MATSU 09/15/2023, 3:19 PM

## 2023-09-15 NOTE — Progress Notes (Signed)
 PROGRESS NOTE   Subjective/Complaints: Pt had a busy night with bowel program/bladder. Frustrated because he was left on the toilet for at least 25. Unfortunately no adverse sequelae as a result. Would like to get to the EOB to empty bladder. Has sensation of bladder fullness/emptying but it's slow. Bowels are similar but probably a little less from a sensation standpoint.   ROS: Patient denies fever, rash, sore throat, blurred vision, dizziness, nausea, vomiting, diarrhea, cough, shortness of breath or chest pain, joint or back/neck pain, headache, or mood change.   Objective:   No results found. Recent Labs    09/15/23 0431  WBC 10.2  HGB 14.7  HCT 41.1  PLT 170   Recent Labs    09/15/23 0431  NA 136  K 3.5  CL 97*  CO2 27  GLUCOSE 117*  BUN 33*  CREATININE 1.32*  CALCIUM  8.8*    Intake/Output Summary (Last 24 hours) at 09/15/2023 0900 Last data filed at 09/15/2023 0801 Gross per 24 hour  Intake 600 ml  Output 1325 ml  Net -725 ml        Physical Exam: Vital Signs Blood pressure 128/78, pulse 78, temperature 97.6 F (36.4 C), temperature source Oral, resp. rate 16, height 5' 10 (1.778 m), weight 94.3 kg, SpO2 100%.  General: Alert and oriented x 3, No apparent distress HEENT: Head is normocephalic, atraumatic, PERRLA, EOMI, sclera anicteric, oral mucosa pink and moist, dentition intact, ext ear canals clear,  Neck: Supple without JVD or lymphadenopathy Heart: Reg rate and rhythm. No murmurs rubs or gallops Chest: CTA bilaterally without wheezes, rales, or rhonchi; no distress Abdomen: Soft, non-tender, non-distended, bowel sounds positive. Extremities: No clubbing, cyanosis, or edema. Pulses are 2+ Psych: Pt's affect is appropriate. Pt is cooperative Skin: Clean and intact without signs of breakdown. Back incision CDI with honeycomb dressing in place.  Neuro:  Alert and oriented x 3. Normal insight  and awareness. Intact Memory. Normal language and speech. Cranial nerve exam unremarkable. MMT: BUE 5/5. BLE 4/5 prox to distal. Sensory diminished at 1/2 in L4-S3 dermatomes.Has rectal numbness too. DTR's trace in LE's. Good sitting posture.   Musculoskeletal: LB tender with ROM/palpation    Assessment/Plan: 1. Functional deficits which require 3+ hours per day of interdisciplinary therapy in a comprehensive inpatient rehab setting. Physiatrist is providing close team supervision and 24 hour management of active medical problems listed below. Physiatrist and rehab team continue to assess barriers to discharge/monitor patient progress toward functional and medical goals  Care Tool:  Bathing              Bathing assist       Upper Body Dressing/Undressing Upper body dressing        Upper body assist      Lower Body Dressing/Undressing Lower body dressing            Lower body assist       Toileting Toileting    Toileting assist       Transfers Chair/bed transfer  Transfers assist           Locomotion Ambulation   Ambulation assist  Walk 10 feet activity   Assist           Walk 50 feet activity   Assist           Walk 150 feet activity   Assist           Walk 10 feet on uneven surface  activity   Assist           Wheelchair     Assist               Wheelchair 50 feet with 2 turns activity    Assist            Wheelchair 150 feet activity     Assist          Blood pressure 128/78, pulse 78, temperature 97.6 F (36.4 C), temperature source Oral, resp. rate 16, height 5' 10 (1.778 m), weight 94.3 kg, SpO2 100%.  Medical Problem List and Plan: 1. Functional deficits secondary to Cauda Equina syndrome/Lumbar Radiculoapthy s/p  L4-5 decompression and fusion by Dr. Mavis on 09/11/2023              -patient may shower, cover incision please             -ELOS/Goals: 7 days,  PT/OT mod I to Sup             --Patient is beginning CIR therapies today including PT and OT  2.  Antithrombotics:  -DVT/anticoagulation:  lovenox              -antiplatelet therapy: asa 81 3. Pain Management: Tylenol  prn. Oxycodone  prn. Continue Robaxin  500 mg BID--change to prn.  4. Mood/Behavior/Sleep: LCSW to follow for evaluation and support.              --Melatonin prn for insomnia.              -antipsychotic agents: N/A 5. Neuropsych/cognition: This patient is capable of making decisions on his own behalf. 6. Skin/Wound Care: Routine pressure relief measures.  7. Fluids/Electrolytes/Nutrition: Monitor I/O. Check CMET in am 8.  HTN: Monitor BP TID--on Zestril  and Toprol  XL. Will set decrease Zestril  to 10 mg and place hold parameters.  Monitor with activity.  --Blood pressures soft -  orthostatic vitals appear unremarkable so far - TEDs ordered. Binder prn.   -acclimate 9. CAD/ICM/chronic combined CHF: Monitor weight daily and for signs of overload.             --Heart healthy diet. On Toprol , Zestril , chorthalidone, Crestor . Off ASA due to surgery             --monitor for symptoms with increase in activity. 10. Abnormal LFTs: ALT-65/AST-90/TB-1.6 @ admission.              --LFT's continue to climb--in 100's today   -pt on zetia  and crestor  but these are chronic meds   -may be just reactive   -will reach out to pharmacy   -recheck in AM 11. Leukocytosis: WBC up to 10.6 @ admission likely due to steroids. Will recheck in am.             --no  fevers and other signs of infection.   -stable at 10.2 today 12 Neurogenic bowel: On Miralax  and colace-->had positive results with suppository yesterday --started on bowel program     13. Neurogenic bladder?: On Flomax -->change to evenings to avoid hypotension.  Monitor PVR/Bladder scan  -pt would like to move to EOB at least to void.  I would prefer him going to bathroom to empty.  14. Pre-renal azotemia: BUN-34 @ admission.    -BUN/Cr  a little worse today  -push fluids  -hold hygroton   -may need a little IVF 15.  Pre-diabetes: Hgb A1C- 6.2 (08/02/23)--started on Wegovy  a few months ago.     LOS: 1 days A FACE TO FACE EVALUATION WAS PERFORMED  Austin Bright 09/15/2023, 9:00 AM

## 2023-09-15 NOTE — Progress Notes (Signed)
 IP Rehab Bowel Program Documentation   Bowel Program Start time [phone]  Dig Stim Indicated? Yes  Dig Stim Prior to Suppository or mini Enema X  Patient refused  Output from dig stim: Patient refused dig stim  Ordered intervention: Suppository Yes , mini enema No ,   Repeat dig stim after Suppository or Mini enema  X , Patient refused  Output? Large   Bowel Program Complete? Yes , handoff given Katrina, RN  Patient Tolerated? Yes

## 2023-09-15 NOTE — Plan of Care (Signed)
  Problem: Consults Goal: RH SPINAL CORD INJURY PATIENT EDUCATION Description:  See Patient Education module for education specifics.  Outcome: Progressing   Problem: SCI BOWEL ELIMINATION Goal: RH STG MANAGE BOWEL WITH ASSISTANCE Description: STG Manage Bowel with  mod I Assistance. Outcome: Progressing   Problem: SCI BLADDER ELIMINATION Goal: RH STG MANAGE BLADDER WITH ASSISTANCE Description: STG Manage Bladder With  mod I Assistance Outcome: Progressing   Problem: RH SKIN INTEGRITY Goal: RH STG SKIN FREE OF INFECTION/BREAKDOWN Description: Manage skin free of infection with mod I assistance Outcome: Progressing   Problem: RH SAFETY Goal: RH STG ADHERE TO SAFETY PRECAUTIONS W/ASSISTANCE/DEVICE Description: STG Adhere to Safety Precautions With  mod I Assistance/Device. Outcome: Progressing   Problem: RH PAIN MANAGEMENT Goal: RH STG PAIN MANAGED AT OR BELOW PT'S PAIN GOAL Description: < 4 w/ prns Outcome: Progressing   Problem: RH KNOWLEDGE DEFICIT SCI Goal: RH STG INCREASE KNOWLEDGE OF SELF CARE AFTER SCI Description: Manage increase  knowledge of self care after SCI with mod I assistance from spouse using educational materials provided  Outcome: Progressing

## 2023-09-15 NOTE — Progress Notes (Signed)
 BLE venous exam is completed. Bronda Alfred, RVT

## 2023-09-15 NOTE — Progress Notes (Signed)
 Inpatient Rehabilitation Admission Medication Review by a Pharmacist  A complete drug regimen review was completed for this patient to identify any potential clinically significant medication issues.  High Risk Drug Classes Is patient taking? Indication by Medication  Antipsychotic No   Anticoagulant Yes Lovenox - DVT ppx   Antibiotic No   Opioid Yes Oxycodone  PRN- pain   Antiplatelet Yes bASA- CAD   Hypoglycemics/insulin No   Vasoactive Medication Yes Chlorthalidone , lisinopril , Toprol  XL - HTN  Nitroglycerin  PRN- CAD   Chemotherapy No   Other Yes sorbitol  , PEG , bisacodyl  , and - constipation Maalox- indigestion Acetaminophen - pain  Robitussin- cough  Robaxin - muscle spasms   melatonin, trazodone , and -insomnia Vitamin C , B12- supplement  Zetia , Crestor  - HLD       Type of Medication Issue Identified Description of Issue Recommendation(s)  Drug Interaction(s) (clinically significant)     Duplicate Therapy     Allergy     No Medication Administration End Date     Incorrect Dose     Additional Drug Therapy Needed     Significant med changes from prior encounter (inform family/care partners about these prior to discharge). PTA meds not resumed- Semaglutide , tamsulosin   Communicate relevant medication changes to patient/family members at discharge from CIR.   Restart or discontinue PTA meds not resumed in CIR at discharge if clinically indicated.   Other       Clinically significant medication issues were identified that warrant physician communication and completion of prescribed/recommended actions by midnight of the next day:  No  Name of provider notified for urgent issues identified:   Provider Method of Notification:     Pharmacist comments:   Time spent performing this drug regimen review (minutes):  25   Massie Fila, PharmD Clinical Pharmacist  09/15/2023 9:10 AM

## 2023-09-15 NOTE — Progress Notes (Signed)
 Physical Therapy Session Note  Patient Details  Name: Austin Bright MRN: [id number] Date of Birth: 1945/04/20  Today's Date: 09/15/2023 PT Individual Time: 1352-1450 PT Individual Time Calculation (min): 58 min   Short Term Goals: Week 1:  PT Short Term Goal 1 (Week 1): Pt will amb up to 150' with RW supervision with decreased scissor pattern PT Short Term Goal 2 (Week 1): Pt will demonstrate sit to stand from low surface ie w/c with min/CGA following proper sequencing PT Short Term Goal 3 (Week 1): Pt will be able to verbalize 3/3 back precautions  Skilled Therapeutic Interventions/Progress Updates: Pt presented in w/c with wife present agreeable to therapy. Pt states just received shower and states a little fatigued but willing to work. Rest break provided during session as needed. Pt transported to main gym for energy conservation. Performed stand from w/c with CGA with pt stating some increased dizziness. Pt returned to sitting and PTA obtained sitting BP 99/64 and in standing 96/64 with some dizziness. Pt completed transfer to high/low mat with light minA and RW. Pt worked on toe taps to 6in step x 10 alternating bilaterally with noted significant use of BUE on RW. Discussed with pt continued awareness of increasing support on BLE vs use of BUE, pt verbalized understanding. PTA then obtained 4in step with pt performing same activity with improved weight bearing tolerance through BLE. Pt also participated in standing balance activity pulling squigz from mirror using single UE with pt taking seated rest break between bouts and performing sit to stands from slightly elevated mat with CGA consistently. Pt also participated in x 2 bouts of cornhole with first bout pt performing with feet equal and second bout pt with staggered (R foot forward) for increased balance challenge. Pt then ambulated with CGA to 4N day room with RW and w/c follow. Pt noted to initially maintain good feet separation then  with fatigue pt noted to have increased scissoring gait and more weight on BUE. Pt then seated in w/c and participated in w/c propulsion >1021ft through unit with supervision for general conditioning. Pt returned to room using w/c and pt completed stand step transfer with RW and CGA overall to bed. Pt was able to doff shoes sitting EOB with supervision maintaining back precautions and then completed sit to supine to bed flat with use of bed rail. Pt left in bed at end of session with bed alarm on, call bell within reach and needs met.    Therapy Documentation Precautions:  Precautions Precautions: Back, Fall Precaution Booklet Issued: No Recall of Precautions/Restrictions: Intact Precaution/Restrictions Comments: pt able to recall 1/3, educated on 3/3 back precautions Required Braces or Orthoses: Spinal Brace Spinal Brace: Lumbar corset Restrictions Weight Bearing Restrictions Per Provider Order: No General:   Vital Signs: Therapy Vitals Temp: 98.7 F (37.1 C) Temp Source: Oral Pulse Rate: 85 Resp: 18 BP: 108/79 Patient Position (if appropriate): Sitting Oxygen Therapy SpO2: 94 % O2 Device: Room Air Pain:   Mobility: Bed Mobility Bed Mobility: Rolling Left;Left Sidelying to Sit;Supine to Sit Rolling Left: Contact Guard/Touching assist Left Sidelying to Sit: Contact Guard/Touching assist Supine to Sit: Contact Guard/Touching assist Transfers Transfers: Sit to Stand;Stand to Sit;Stand Pivot Transfers Sit to Stand: Minimal Assistance - Patient > 75% Stand to Sit: Minimal Assistance - Patient > 75% Stand Pivot Transfers: Minimal Assistance - Patient > 75% Transfer (Assistive device): Rolling walker Locomotion : Gait Ambulation: Yes Gait Assistance: Minimal Assistance - Patient > 75% Gait Distance (Feet): 45  Feet Assistive device: Rolling walker Gait Assistance Details: Tactile cues for initiation;Visual cues for safe use of DME/AE;Verbal cues for sequencing;Verbal cues for  gait pattern;Verbal cues for safe use of DME/AE;Verbal cues for technique;Verbal cues for precautions/safety Gait Assistance Details: semi scissoring, heel toe pattern, heavy UE use with RW for support Gait Gait: Yes Gait Pattern: Impaired Gait Pattern: Trunk flexed;Narrow base of support;Scissoring Gait velocity: decreased Stairs / Additional Locomotion Stairs: Yes Stairs Assistance: Contact Guard/Touching assist;Minimal Assistance - Patient > 75% Stair Management Technique: Two rails Number of Stairs: 4 Height of Stairs: 6 Ramp: Contact Guard/touching assist Curb: Contact Guard/Touching assist Wheelchair Mobility Wheelchair Mobility: No  Trunk/Postural Assessment : Cervical Assessment Cervical Assessment: Within Functional Limits Thoracic Assessment Thoracic Assessment: Exceptions to [facility] (back sx with brace) Lumbar Assessment Lumbar Assessment: Within Functional Limits Postural Control Postural Control: Deficits on evaluation Protective Responses: delayed  Balance: Balance Balance Assessed: Yes Static Sitting Balance Static Sitting - Balance Support: Feet supported Static Sitting - Level of Assistance: 6: Modified independent (Device/Increase time) Dynamic Sitting Balance Dynamic Sitting - Balance Support: Feet supported Dynamic Sitting - Level of Assistance: 5: Stand by assistance Static Standing Balance Static Standing - Balance Support: Bilateral upper extremity supported Static Standing - Level of Assistance: 4: Min assist Dynamic Standing Balance Dynamic Standing - Balance Support: Bilateral upper extremity supported Dynamic Standing - Level of Assistance: 4: Min assist Dynamic Standing - Balance Activities: Lateral lean/weight shifting Exercises:   Other Treatments:      Therapy/Group: Individual Therapy  Jaylenne Hamelin 09/15/2023, 3:19 PM

## 2023-09-15 NOTE — Progress Notes (Signed)
 Physical Therapy Assessment and Plan  Patient Details  Name: Austin Bright MRN: [id number] Date of Birth: 1945-12-16  PT Diagnosis: Abnormality of gait, Difficulty walking, Impaired sensation, Low back pain, Muscle weakness, and Pain in joint Rehab Potential: Good ELOS: 10-14 days   Today's Date: 09/15/2023 PT Individual Time: 1045-1200 PT Individual Time Calculation (min): 75 min    Hospital Problem: Principal Problem:   Cauda equina compression [facility])   Past Medical History:  Past Medical History:  Diagnosis Date   Acute diverticulitis 08/17/2020   Acute MI (HCC) 08/2011   Arthritis    Bilateral pneumonia    Diagnosed after STEMI 08/2011   CAD (coronary artery disease)    a. Diagnosed 08/2011 with anterior STEMI due to thrombotic occlusion of mid LAD s/p thrombectomy, PTCA, DES placement 08/23/11.   CHF (congestive heart failure) (HCC)    Chronic combined systolic and diastolic congestive heart failure (HCC) 09/15/2011   Dyslipidemia    Gout    Hypertension    Ischemic cardiomyopathy    a. Initial EF 35% by cath 08/23/11, improved to 40-45% by echo 08/25/11. 50-55% by echo November 2013.   Rotator cuff tear arthropathy, right    Shortness of breath    Past Surgical History:  Past Surgical History:  Procedure Laterality Date   carpel tunnel Bilateral 2002   COLONOSCOPY WITH PROPOFOL  N/A 04/08/2017   Surgeon: [name], MD;  diverticulosis in sigmoid and descending colon, internal hemorrhoids, otherwise normal exam.  No recommendations to repeat due to age.   COLONOSCOPY WITH PROPOFOL  N/A 03/17/2021   Procedure: COLONOSCOPY WITH PROPOFOL ;  Surgeon: [name], DO;  Location: [facility];  Service: Endoscopy;  Laterality: N/A;  8:00am   CORONARY STENT PLACEMENT     CYSTECTOMY  1969   pilonidal cyst   LEFT AND RIGHT HEART CATHETERIZATION WITH CORONARY ANGIOGRAM N/A 09/15/2011   Procedure: LEFT AND RIGHT HEART CATHETERIZATION WITH CORONARY ANGIOGRAM;  Surgeon:  [name], MD;  Location: [facility] CATH LAB;  Service: Cardiovascular;  Laterality: N/A;   LEFT HEART CATH N/A 08/23/2011   Procedure: LEFT HEART CATH;  Surgeon: [name], MD;  Location: [facility] CATH LAB;  Service: Cardiovascular;  Laterality: N/A;   NO PAST SURGERIES     PERCUTANEOUS CORONARY STENT INTERVENTION (PCI-S)  08/23/2011   Procedure: PERCUTANEOUS CORONARY STENT INTERVENTION (PCI-S);  Surgeon: [name], MD;  Location: [facility] CATH LAB;  Service: Cardiovascular;;    Assessment & Plan Clinical Impression: Patient is a 78 year old male with history of CAD/ICM with combined CHF, HTN, gout. Presented to ED on 09/11/23 with reports of progressive pain, inability to walk, perineal numbness and urinary retention with inability to void. MRI lumbar spine revealed severe spinal stenosis L3/L4, L4/L5, bulky disc extrusion L4-L5 and moderate spinal stenosis L2/L3. He was taken to OR emergently for bilateral L4 and L5 laminectomy for decompression of nerve roots and interbody fusion by Dr. Mavis on the same day.  Post op LSO to be donned at [facility]. Sensation BLE improving but he continues to have bilateral knee instability, foley removed yesterday and reported to be voiding.  Reports he feels bladder function improving and reports he has been continent. Reports continent bowel movement today. Reports sensation was intact during BM.  Patient transferred to CIR on 09/14/2023 .   Patient currently requires mod with mobility secondary to muscle weakness, decreased cardiorespiratoy endurance, unbalanced muscle activation, motor apraxia, and decreased coordination, and decreased standing balance, decreased  postural control, and decreased balance strategies.  Prior to hospitalization, patient was independent  with mobility and lived with Spouse in a House home.  Home access is 2Stairs to enter.  Patient will benefit from skilled PT intervention to maximize safe functional mobility, minimize fall risk, and  decrease caregiver burden for planned discharge home with 24 hour supervision.  Anticipate patient will benefit from follow up HH at discharge.  PT - End of Session Activity Tolerance: Decreased this session;Tolerates 10 - 20 min activity with multiple rests Endurance Deficit: Yes Endurance Deficit Description: generalized BLE weakness PT Assessment Rehab Potential (ACUTE/IP ONLY): Good PT Barriers to Discharge: None PT Patient demonstrates impairments in the following area(s): Balance;Endurance;Motor;Pain;Safety;Skin Integrity PT Transfers Functional Problem(s): Bed Mobility;Bed to Chair;Car;Furniture PT Locomotion Functional Problem(s): Ambulation;Stairs;Wheelchair Mobility PT Plan PT Intensity: Minimum of 1-2 x/day ,45 to 90 minutes PT Frequency: 5 out of 7 days PT Duration Estimated Length of Stay: 10-12 days PT Treatment/Interventions: Ambulation/gait training;Discharge planning;DME/adaptive equipment instruction;Functional mobility training;Pain management;Psychosocial support;Therapeutic Activities;UE/LE Strength taining/ROM;Balance/vestibular training;Community reintegration;Disease management/prevention;Neuromuscular re-education;Patient/family education;Skin care/wound management;Stair training;Therapeutic Exercise;UE/LE Coordination activities;Wheelchair propulsion/positioning PT Transfers Anticipated Outcome(s): mod indep PT Locomotion Anticipated Outcome(s): mod indep with LRAD PT Recommendation Recommendations for Other Services: Therapeutic Recreation consult Therapeutic Recreation Interventions: Pet therapy;Stress management;Outing/community reintergration Follow Up Recommendations: Home health PT Patient destination: Home Equipment Recommended: To be determined   PT Evaluation Precautions/Restrictions Precautions Precautions: Back;Fall Precaution Booklet Issued: No Recall of Precautions/Restrictions: Intact Precaution/Restrictions Comments: pt able to recall 1/3,  educated on 3/3 back precautions Required Braces or Orthoses: Spinal Brace Spinal Brace: Lumbar corset Restrictions Weight Bearing Restrictions Per Provider Order: No General   Vital SignsTherapy Vitals Temp: 98.7 F (37.1 C) Temp Source: Oral Pulse Rate: 85 Resp: 18 BP: 108/79 Patient Position (if appropriate): Sitting Oxygen Therapy SpO2: 94 % O2 Device: Room Air Pain   Pain Interference Pain Interference Pain Effect on Sleep: 1. Rarely or not at all Pain Interference with Therapy Activities: 1. Rarely or not at all Pain Interference with Day-to-Day Activities: 1. Rarely or not at all Home Living/Prior Functioning Home Living Available Help at Discharge: Family;Available 24 hours/day Type of Home: House Home Access: Stairs to enter [employer] of Steps: 2 Entrance Stairs-Rails: None Home Layout: Two level;Bed/bath upstairs Alternate Level Stairs-Number of Steps: full flight to get upstairs Alternate Level Stairs-Rails: Right Bathroom Shower/Tub: Health visitor: Handicapped height Additional Comments: owns a rollator  Lives With: Spouse Prior Function Level of Independence: Independent with basic ADLs;Independent with transfers;Independent with homemaking with ambulation;Independent with gait  Able to Take Stairs?: Yes Driving: Yes Vocation: Retired Vision/Perception  Vision - History Ability to See in Adequate Light: 0 Adequate Perception Perception: Within Functional Limits Praxis Praxis: WFL  Cognition Overall Cognitive Status: Within Functional Limits for tasks assessed Arousal/Alertness: Awake/alert Orientation Level: Oriented X4 Attention: Focused Focused Attention: Appears intact Memory: Appears intact Awareness: Appears intact Problem Solving: Appears intact Safety/Judgment: Appears intact Sensation Sensation Light Touch: Impaired Detail Central sensation comments: Pt reports improving daily- overall intact from he  reports abnormal sensation in bottom of feet especially and on inside of calf Light Touch Impaired Details: Impaired LLE;Impaired RLE Hot/Cold: Appears Intact Proprioception: Impaired by gross assessment Stereognosis: Impaired by gross assessment Coordination Gross Motor Movements are Fluid and Coordinated: No Fine Motor Movements are Fluid and Coordinated: Yes Coordination and Movement Description: BLE weakness and sensation changes Motor  Motor Motor: Abnormal postural alignment and control   Trunk/Postural Assessment  Cervical Assessment Cervical Assessment: Within Functional Limits  Thoracic Assessment Thoracic Assessment: Exceptions to [facility] (back sx with brace) Lumbar Assessment Lumbar Assessment: Within Functional Limits Postural Control Postural Control: Deficits on evaluation Protective Responses: delayed  Balance Balance Balance Assessed: Yes Static Sitting Balance Static Sitting - Balance Support: Feet supported Static Sitting - Level of Assistance: 6: Modified independent (Device/Increase time) Dynamic Sitting Balance Dynamic Sitting - Balance Support: Feet supported Dynamic Sitting - Level of Assistance: 5: Stand by assistance Static Standing Balance Static Standing - Balance Support: Bilateral upper extremity supported Static Standing - Level of Assistance: 4: Min assist Dynamic Standing Balance Dynamic Standing - Balance Support: Bilateral upper extremity supported Dynamic Standing - Level of Assistance: 4: Min assist Dynamic Standing - Balance Activities: Lateral lean/weight shifting Extremity Assessment  RLE Assessment RLE Assessment: Exceptions to [facility] General Strength Comments: grossly 4/5 throughout LLE Assessment LLE Assessment: Exceptions to [facility] General Strength Comments: grossly 4/5 with exception EHL 3/5  Care Tool Care Tool Bed Mobility Roll left and right activity   Roll left and right assist level: Contact Guard/Touching assist    Sit to  lying activity        Lying to sitting on side of bed activity   Lying to sitting on side of bed assist level: the ability to move from lying on the back to sitting on the side of the bed with no back support.: Contact Guard/Touching assist     Care Tool Transfers Sit to stand transfer   Sit to stand assist level: Moderate Assistance - Patient 50 - 74%    Chair/bed transfer   Chair/bed transfer assist level: Minimal Assistance - Patient > 75%    Car transfer   Car transfer assist level: Minimal Assistance - Patient > 75%      Care Tool Locomotion Ambulation   Assist level: Minimal Assistance - Patient > 75% Assistive device: Walker-rolling Max distance: 45  Walk 10 feet activity   Assist level: Contact Guard/Touching assist Assistive device: Walker-rolling   Walk 50 feet with 2 turns activity Walk 50 feet with 2 turns activity did not occur: Safety/medical concerns (fatigue)      Walk 150 feet activity Walk 150 feet activity did not occur: Safety/medical concerns (fatigue)      Walk 10 feet on uneven surfaces activity Walk 10 feet on uneven surfaces activity did not occur: Safety/medical concerns (fatigue)      Stairs   Assist level: Minimal Assistance - Patient > 75% Stairs assistive device: 2 hand rails Max number of stairs: 4  Walk up/down 1 step activity   Walk up/down 1 step (curb) assist level: Minimal Assistance - Patient > 75% Walk up/down 1 step or curb assistive device: 2 hand rails  Walk up/down 4 steps activity   Walk up/down 4 steps assist level: Minimal Assistance - Patient > 75% Walk up/down 4 steps assistive device: 2 hand rails  Walk up/down 12 steps activity Walk up/down 12 steps activity did not occur: Safety/medical concerns (fatigue, weakness)      Pick up small objects from floor Pick up small object from the floor (from standing position) activity did not occur: Safety/medical concerns (fall precautions)      Wheelchair Is the patient using a  wheelchair?: Yes Type of Wheelchair: Manual   Wheelchair assist level: Total Assistance - Patient < 25%    Wheel 50 feet with 2 turns activity   Assist Level: Total Assistance - Patient < 25%  Wheel 150 feet activity   Assist Level: Total Assistance - Patient <  25%    Refer to Care Plan for Long Term Goals  SHORT TERM GOAL WEEK 1 PT Short Term Goal 1 (Week 1): Pt will amb up to 150' with RW supervision with decreased scissor pattern PT Short Term Goal 2 (Week 1): Pt will demonstrate sit to stand from low surface ie w/c with min/CGA following proper sequencing PT Short Term Goal 3 (Week 1): Pt will be able to verbalize 3/3 back precautions  Recommendations for other services: Neuropsych and Therapeutic Recreation  Pet therapy, Stress management, and Outing/community reintegration  Skilled Therapeutic Intervention Mobility Bed Mobility Bed Mobility: Rolling Left;Left Sidelying to Sit;Supine to Sit Rolling Left: Contact Guard/Touching assist Left Sidelying to Sit: Contact Guard/Touching assist Supine to Sit: Contact Guard/Touching assist Transfers Transfers: Sit to Stand;Stand to Sit;Stand Pivot Transfers Sit to Stand: Minimal Assistance - Patient > 75% Stand to Sit: Minimal Assistance - Patient > 75% Stand Pivot Transfers: Minimal Assistance - Patient > 75% Transfer (Assistive device): Rolling walker Locomotion  Gait Ambulation: Yes Gait Assistance: Minimal Assistance - Patient > 75% Gait Distance (Feet): 45 Feet Assistive device: Rolling walker Gait Assistance Details: Tactile cues for initiation;Visual cues for safe use of DME/AE;Verbal cues for sequencing;Verbal cues for gait pattern;Verbal cues for safe use of DME/AE;Verbal cues for technique;Verbal cues for precautions/safety Gait Assistance Details: semi scissoring, heel toe pattern, heavy UE use with RW for support Gait Gait: Yes Gait Pattern: Impaired Gait Pattern: Trunk flexed;Narrow base of support;Scissoring Gait  velocity: decreased Stairs / Additional Locomotion Stairs: Yes Stairs Assistance: Contact Guard/Touching assist;Minimal Assistance - Patient > 75% Stair Management Technique: Two rails Number of Stairs: 4 Height of Stairs: 6 Ramp: Contact Guard/touching assist Curb: Contact Guard/Touching assist Wheelchair Mobility Wheelchair Mobility: No  Evaluation completed (see details above and below) with education on PT POC and goals and individual treatment initiated with focus on bed mobility, balance, transfers, car transfers, WC mobility, and ambulation. Pt received supine in bed and agrees to therapy. Reports some pain in low back post sx, 2/10. Pt completes supine to sit with bed features and cues for positioning CGA.  Squat pivot to WC, elevated bed height, with minA and cues for hand placement and positioning. Pt amb with RW CGA x~ with cueing to prevent tandem walking/semi scissor pattern.  Pt requires visual cues at times, due to proprioceptive deficits. Pt able to self correct, however, will return to pattern. Moderate use of UE during gait due to BLE weakness.  Following rest break, pt completes car transfers with minA and cues for sequencing. Pt then completes ramp navigation Min A. Seated rest break. Pt then completes sit to stand with mod A and cues for hand placement, then ambulates x15' with RW and minA, with cues for posture, safe AD management, and step placement. WC transport back to room. Left seated in [facility] with all needs within reach    Discharge Criteria: Patient will be discharged from PT if patient refuses treatment 3 consecutive times without medical reason, if treatment goals not met, if there is a change in medical status, if patient makes no progress towards goals or if patient is discharged from hospital.  The above assessment, treatment plan, treatment alternatives and goals were discussed and mutually agreed upon: by patient and by family  Arland GORMAN Fast 09/15/2023, 1:37 PM

## 2023-09-15 NOTE — Progress Notes (Signed)
 Inpatient Rehabilitation Center Individual Statement of Services  Patient Name:  Austin Bright  Date:  09/15/2023  Welcome to the Inpatient Rehabilitation Center.  Our goal is to provide you with an individualized program based on your diagnosis and situation, designed to meet your specific needs.  With this comprehensive rehabilitation program, you will be expected to participate in at least 3 hours of rehabilitation therapies Monday-Friday, with modified therapy programming on the weekends.  Your rehabilitation program will include the following services:  Physical Therapy (PT), Occupational Therapy (OT), 24 hour per day rehabilitation nursing, Therapeutic Recreaction (TR), Care Coordinator, Rehabilitation Medicine, Nutrition Services, and Pharmacy Services  Weekly team conferences will be held on Tuesday to discuss your progress.  Your Inpatient Rehabilitation Care Coordinator will talk with you frequently to get your input and to update you on team discussions.  Team conferences with you and your family in attendance may also be held.  Expected length of stay: 10-12 days  Overall anticipated outcome: supervision/mod/I level  Depending on your progress and recovery, your program may change. Your Inpatient Rehabilitation Care Coordinator will coordinate services and will keep you informed of any changes. Your Inpatient Rehabilitation Care Coordinator's name and contact numbers are listed  below.  The following services may also be recommended but are not provided by the Inpatient Rehabilitation Center:  Driving Evaluations Home Health Rehabiltiation Services Outpatient Rehabilitation Services Vocational Rehabilitation   Arrangements will be made to provide these services after discharge if needed.  Arrangements include referral to agencies that provide these services.  Your insurance has been verified to be:  [employer] Your primary doctor is:  Suzzane Blanch  Pertinent  information will be shared with your doctor and your insurance company.  Inpatient Rehabilitation Care Coordinator:  Graeme Jude, KEN [phone] or ([phone]  Information discussed with and copy given to patient by: Raymonde Asberry MATSU, 09/15/2023, 3:21 PM

## 2023-09-15 NOTE — Progress Notes (Signed)
 Inpatient Rehabilitation  Patient information reviewed and entered into eRehab system by [facility]. Karen Kays., CCC/SLP, PPS Coordinator.  Information including medical coding, functional ability and quality indicators will be reviewed and updated through discharge.

## 2023-09-15 NOTE — Progress Notes (Signed)
 [name], MD  Physician Physical Medicine and Rehabilitation   PMR Pre-admission    Signed   Date of Service: 09/14/2023 12:23 PM  Related encounter: ED to Hosp-Admission (Discharged) from 09/11/2023 in [facility]   Signed     Expand All Collapse All  PMR Admission Coordinator Pre-Admission Assessment   Patient: Austin Bright is an 78 y.o., male MRN: [id number] DOB: [birth year] Height: 5' 10 (177.8 cm) Weight: 94.3 kg   Insurance Information HMO:     PPO: Yes     PCP:       IPA:       80/20:       OTHER:  Group [id number] PRIMARY: [employer] Medicare Choice PPO      Policy#: [id number]      Subscriber: self     CM Name: [name]      Phone#: [phone] ext [phone]     Fax#: [phone] Pre-Cert#: [id number] auth for CIR from [facility] with [facility] Medicare with updates due to [name] (ext [phone]) on 9/2 at fax listed above     Employer: Retired Benefits:  Phone #: [phone]     Name: checked online at [email or web address] Eff. Date: 01/20/23     Deduct: $0      Out of Pocket Max: $4000 (met $400)      Life Max: n/a CIR: $160/day for days 1-0 max $1600/admission      SNF: 40 for days 1-20; $50/day for days 21-100 Outpatient: med nec     Co-Pay: $20/visit Home Health: 100%      Co-Pay: none DME: 80%     Co-Pay: 20% Providers: in network SECONDARY:       Policy#:      Phone#:    Artist:       Phone#:    The Data processing manager" for patients in Inpatient Rehabilitation Facilities with attached "Privacy Act Statement-Health Care Records" was provided and verbally reviewed with: Patient   Emergency Contact Information Contact Information       Name Relation Home Work Mobile    [place] Spouse     [phone]         Other Contacts       Name Relation Home Work Mobile    [place] Other [phone]   [phone]           Current Medical History  Patient Admitting Diagnosis: Cauda equina  s/p PLIF   History of Present Illness: A 78 year old white male who had a myocardial infarction in 2013 treated with a stent.  He takes a baby aspirin  daily.   He was in his you state of health until 6 days ago when he began having back pain.  It has progressively gotten worse to the point where he cannot stand and walk because of the pain.  He perineal numbness and urinary retention this morning.  He was seen at the [facility] ED on 09/09/23 and worked up with a lumbar MRI which demonstrated severe stenosis and a central herniated disc at L4-5.  Patient returned to [facility] ED on 09/11/23.  Dr. Mavis was contacted and had the patient transferred to [facility].  He offered complaints of back pain with left greater leg pain numbness and tingling.  He complained of perineal anesthesia and urinary retention. He underwent PLIF by Dr. Mavis on 09/11/23.  He continues with a foley catheter.  PT/OT evaluations were completed with  recommendations for acute inpatient rehab admission.    Patient's medical record from [facility] has been reviewed by the rehabilitation admission coordinator and physician.   Past Medical History      Past Medical History:  Diagnosis Date   Acute diverticulitis 08/17/2020   Acute MI (HCC) 08/2011   Arthritis     Bilateral pneumonia      Diagnosed after STEMI 08/2011   CAD (coronary artery disease)      a. Diagnosed 08/2011 with anterior STEMI due to thrombotic occlusion of mid LAD s/p thrombectomy, PTCA, DES placement 08/23/11.   CHF (congestive heart failure) (HCC)     Chronic combined systolic and diastolic congestive heart failure (HCC) 09/15/2011   Dyslipidemia     Gout     Hypertension     Ischemic cardiomyopathy      a. Initial EF 35% by cath 08/23/11, improved to 40-45% by echo 08/25/11. 50-55% by echo November 2013.   Shortness of breath            Has the patient had major surgery during 100 days prior to admission? Yes    Family History   family history includes Hypertension in his father; Stroke in his mother.   Current Medications  Current Medications    Current Facility-Administered Medications:    acetaminophen  (TYLENOL ) tablet 650 mg, 650 mg, Oral, Q4H PRN **OR** acetaminophen  (TYLENOL ) suppository 650 mg, 650 mg, Rectal, Q4H PRN, [name], MD   bisacodyl  (DULCOLAX) suppository 10 mg, 10 mg, Rectal, Daily PRN, [name], MD, 10 mg at 09/13/23 0845   Chlorhexidine  Gluconate Cloth 2 % PADS 6 each, 6 each, Topical, Daily, [name], MD, 6 each at 09/13/23 1102   chlorthalidone  (HYGROTON ) tablet 25 mg, 25 mg, Oral, Daily, [name], MD, 25 mg at 09/13/23 0848   cyclobenzaprine  (FLEXERIL ) tablet 10 mg, 10 mg, Oral, TID PRN, [name], MD   docusate sodium  (COLACE) capsule 100 mg, 100 mg, Oral, BID, [name], MD, 100 mg at 09/13/23 9152   ezetimibe  (ZETIA ) tablet 10 mg, 10 mg, Oral, Daily, [name], MD, 10 mg at 09/13/23 0848   lisinopril  (ZESTRIL ) tablet 20 mg, 20 mg, Oral, Daily, [name], MD, 20 mg at 09/13/23 0848   menthol -cetylpyridinium (CEPACOL) lozenge 3 mg, 1 lozenge, Oral, PRN **OR** phenol (CHLORASEPTIC) mouth spray 1 spray, 1 spray, Mouth/Throat, PRN, [name], MD   methocarbamol  (ROBAXIN ) tablet 500 mg, 500 mg, Oral, BID, [name], MD, 500 mg at 09/13/23 9151   metoprolol  succinate (TOPROL -XL) 24 hr tablet 25 mg, 25 mg, Oral, Daily, [name], MD, 25 mg at 09/13/23 0848   morphine  (PF) 2 MG/ML injection 2-4 mg, 2-4 mg, Intravenous, Q2H PRN, [name], MD   mupirocin  ointment (BACTROBAN ) 2 % 1 Application, 1 Application, Nasal, BID, [name], MD, 1 Application at 09/13/23 9150   nitroGLYCERIN  (NITROSTAT ) SL tablet 0.4 mg, 0.4 mg, Sublingual, Q5 min PRN, [name], MD   ondansetron  (ZOFRAN ) tablet 4 mg, 4 mg, Oral, Q6H PRN **OR** ondansetron  (ZOFRAN ) injection 4 mg, 4 mg, Intravenous, Q6H PRN,  [name], MD   oxyCODONE  (Oxy IR/ROXICODONE ) immediate release tablet 10 mg, 10 mg, Oral, Q3H PRN, [name], MD   oxyCODONE  (Oxy IR/ROXICODONE ) immediate release tablet 5 mg, 5 mg, Oral, Q3H PRN, [name], MD   polyethylene glycol (MIRALAX  / GLYCOLAX ) packet 17 g, 17 g, Oral, Daily, Bergman, Meghan D, NP, 17 g at 09/13/23 1101   rosuvastatin  (CRESTOR ) tablet 40 mg, 40  mg, Oral, Daily, [name], MD, 40 mg at 09/13/23 0848   sodium chloride  flush (NS) 0.9 % injection 3 mL, 3 mL, Intravenous, Q12H, [name], MD, 3 mL at 09/13/23 9150   sodium chloride  flush (NS) 0.9 % injection 3 mL, 3 mL, Intravenous, PRN, [name], MD, 3 mL at 09/12/23 9491   sodium phosphate  (FLEET) enema 1 enema, 1 enema, Rectal, Daily PRN, Bergman, Meghan D, NP   tamsulosin  (FLOMAX ) capsule 0.4 mg, 0.4 mg, Oral, QPC breakfast, Bergman, Meghan D, NP, 0.4 mg at 09/13/23 1101     Patients Current Diet:  Diet Order                  Diet Heart Room service appropriate? Yes; Fluid consistency: Thin  Diet effective now                         Precautions / Restrictions Precautions Precautions: Back, Fall Precaution Booklet Issued: No Precaution/Restrictions Comments: Educated on 3/3 back precautions. Spinal Brace: Lumbar corset, Applied in sitting position Restrictions Weight Bearing Restrictions Per Provider Order: No    Has the patient had 2 or more falls or a fall with injury in the past year? No   Prior Activity Level Limited Community (1-2x/wk): Worked from home.  Went out 1-2 times a week, was driving.   Prior Functional Level Self Care: Did the patient need help bathing, dressing, using the toilet or eating? Independent   Indoor Mobility: Did the patient need assistance with walking from room to room (with or without device)? Independent   Stairs: Did the patient need assistance with internal or external stairs (with or without device)? Independent    Functional Cognition: Did the patient need help planning regular tasks such as shopping or remembering to take medications? Independent   Patient Information Are you of Hispanic, Latino/a,or Spanish origin?: A. No, not of Hispanic, Latino/a, or Spanish origin What is your race?: A. White Do you need or want an interpreter to communicate with a doctor or health care staff?: 0. No Patient information obtained via proxy : no   Patient's Response To:  Health Literacy and Transportation Is the patient able to respond to health literacy and transportation needs?: Yes Health Literacy - How often do you need to have someone help you when you read instructions, pamphlets, or other written material from your doctor or pharmacy?: Never In the past 12 months, has lack of transportation kept you from medical appointments or from getting medications?: No In the past 12 months, has lack of transportation kept you from meetings, work, or from getting things needed for daily living?: No Higher education careers adviser obtained via proxy: no   Journalist, newspaper / Equipment Home Equipment: Rollator (4 wheels), Cane - single point, Crutches   Prior Device Use: Indicate devices/aids used by the patient prior to current illness, exacerbation or injury? None of the above   Current Functional Level Cognition   Orientation Level: Oriented X4    Extremity Assessment (includes Sensation/Coordination)   Upper Extremity Assessment: Overall WFL for tasks assessed, Right hand dominant, RUE deficits/detail, LUE deficits/detail RUE Deficits / Details: Chronic RCT, non operative RUE Sensation: WNL RUE Coordination: WNL LUE Sensation: WNL LUE Coordination: WNL  Lower Extremity Assessment: Defer to PT evaluation     ADLs   Overall ADL's : Needs assistance/impaired Eating/Feeding: Independent, Sitting Grooming: Wash/dry hands, Wash/dry face, Set up, Sitting Upper Body Bathing: Supervision/ safety,  Sitting Lower  Body Bathing: Moderate assistance, Sitting/lateral leans Upper Body Dressing : Supervision/safety, Sitting Lower Body Dressing: Moderate assistance, Sitting/lateral leans Toilet Transfer: Moderate assistance, Regular Teacher, adult education Details (indicate cue type and reason): use of Stedy     Mobility   Overal bed mobility: Needs Assistance Bed Mobility: Rolling, Sidelying to Sit Rolling: Contact guard assist Sidelying to sit: Contact guard assist Sit to sidelying: Min assist General bed mobility comments: worked on tolerating flat bed 2 mins, then bridging knees one at a time and core activation with pelvic tilts. Pt tolerated well and then able to roll to R with use of bed rail. CGA for SL to sit but no physical assist given     Transfers   Overall transfer level: Needs assistance Equipment used: Ambulation equipment used, Rolling walker (2 wheels) Transfers: Sit to/from Stand, Bed to chair/wheelchair/BSC Sit to Stand: Min assist, From elevated surface, Via lift equipment, +2 safety/equipment Bed to/from chair/wheelchair/BSC transfer type:: Via Lift equipment Step pivot transfers: Mod assist Transfer via Lift Equipment: Stedy General transfer comment: began with stedy for safety and to improve pt confidence. Pt able to stand with min A for power up and cues for posture. Pivoted to chair in stedy and then worked on STS to [employer] from [employer] Needed min A for safety but had sufficient LE strength to stand     Ambulation / Gait / Stairs / Wheelchair Mobility   Ambulation/Gait Ambulation/Gait assistance: Mod assist, +2 safety/equipment Gait Distance (Feet): 35 Feet (5' fwd, 5' bkwd, 25' fwd) Assistive device: Rolling walker (2 wheels) Gait Pattern/deviations: Step-through pattern, Decreased stride length General Gait Details: pt leaning heavily on RW which made bkwd stepping difficult but pt improved with practice. Pt has increased low back pain with stepping compared to  yesterday but was pleased to have increased sensation in BLE's. Knees buckling slightly by end of 25'. Chair brought behind for safety Gait velocity: decreased Gait velocity interpretation: <1.31 ft/sec, indicative of household ambulator Pre-gait activities: wt shifting and then stepping     Posture / Balance Balance Overall balance assessment: Needs assistance Sitting-balance support: Feet supported Sitting balance-Leahy Scale: Good Standing balance support: Reliant on assistive device for balance, Bilateral upper extremity supported Standing balance-Leahy Scale: Poor Standing balance comment: needed mod A for dynamic balance     Special considerations/life events  Skin Post op surgical back incision with dressing    Previous Home Environment (from acute therapy documentation) Living Arrangements: Spouse/significant other Available Help at Discharge: Family, Available 24 hours/day Type of Home: House Home Layout: Two level, Bed/bath upstairs Alternate Level Stairs-Rails: Right Alternate Level Stairs-Number of Steps: flight Home Access: Stairs to enter Entrance Stairs-Rails: None Entrance Stairs-Number of Steps: 2 Firefighter: Standard Home Care Services: No   Discharge Living Setting Plans for Discharge Living Setting: Patient's home, House, Lives with (comment) (Lives with wife.) Type of Home at Discharge: House Discharge Home Layout: Two level, 1/2 bath on main level, Bed/bath upstairs Alternate Level Stairs-Rails: Right Alternate Level Stairs-Number of Steps: 13 Discharge Home Access: Stairs to enter Entrance Stairs-Rails: None Entrance Stairs-Number of Steps: 2 Discharge Bathroom Shower/Tub: Walk-in shower, Door Discharge Bathroom Toilet: Handicapped height Discharge Bathroom Accessibility: Yes How Accessible: Accessible via walker Does the patient have any problems obtaining your medications?: No   Social/Family/Support Systems Patient Roles: Spouse Contact  Information: Mcguire Gasparyan - wife Anticipated Caregiver: wife Anticipated Caregiver's Contact Information: [name] - wife - [phone] Ability/Limitations of Caregiver: Wife works but can take two weeks off work after  patient discharge to assist Caregiver Availability: 24/7 Discharge Plan Discussed with Primary Caregiver: Yes Is Caregiver In Agreement with Plan?: Yes Does Caregiver/Family have Issues with Lodging/Transportation while Pt is in Rehab?: No   Goals Patient/Family Goal for Rehab: PT/OT mod I and supervision goals Expected length of stay: 7 days Pt/Family Agrees to Admission and willing to participate: Yes Program Orientation Provided & Reviewed with Pt/Caregiver Including Roles  & Responsibilities: Yes   Decrease burden of Care through IP rehab admission: N/A   Possible need for SNF placement upon discharge: Not anticipated   Patient Condition: I have reviewed medical records from [facility], spoken with CM, and patient. I met with patient at the bedside for inpatient rehabilitation assessment.  Patient will benefit from ongoing PT and OT, can actively participate in 3 hours of therapy a day 5 days of the week, and can make measurable gains during the admission.  Patient will also benefit from the coordinated team approach during an Inpatient Acute Rehabilitation admission.  The patient will receive intensive therapy as well as Rehabilitation physician, nursing, social worker, and care management interventions.  Due to bladder management, bowel management, safety, skin/wound care, disease management, medication administration, pain management, and patient education the patient requires 24 hour a day rehabilitation nursing.  The patient is currently min to mod assist with mobility and basic ADLs.  Discharge setting and therapy post discharge at home with home health is anticipated.  Patient has agreed to participate in the Acute Inpatient Rehabilitation Program and will admit  today.   Preadmission Screen Completed By:  Lovett CHRISTELLA Ropes, and Reche Lowers 09/13/2023 3:19 PM ______________________________________________________________________   Discussed status with Dr. Urbano on 09/14/23  at 12:23 PM  and received approval for admission today.   Admission Coordinator:  Lovett CHRISTELLA Ropes, RN, and Reche Lowers  time 12:23 PM Pattricia 09/14/23     Assessment/Plan: Diagnosis: Cauda equina s/p PLIF Does the need for close, 24 hr/day Medical supervision in concert with the patient's rehab needs make it unreasonable for this patient to be served in a less intensive setting? Yes Co-Morbidities requiring supervision/potential complications: CAD, hx diverticulitis, CHF, dyslipidemia, gout, HTN Due to bladder management, bowel management, safety, skin/wound care, disease management, medication administration, pain management, and patient education, does the patient require 24 hr/day rehab nursing? Yes Does the patient require coordinated care of a physician, rehab nurse, PT, OT, and SLP to address physical and functional deficits in the context of the above medical diagnosis(es)? Yes Addressing deficits in the following areas: balance, endurance, locomotion, strength, transferring, bowel/bladder control, bathing, dressing, feeding, grooming, toileting, and psychosocial support Can the patient actively participate in an intensive therapy program of at least 3 hrs of therapy 5 days a week? Yes The potential for patient to make measurable gains while on inpatient rehab is excellent Anticipated functional outcomes upon discharge from inpatient rehab: modified independent and supervision PT, modified independent and supervision OT, n/a SLP Estimated rehab length of stay to reach the above functional goals is: 7 Anticipated discharge destination: Home 10. Overall Rehab/Functional Prognosis: excellent     MD Signature: Murray Urbano         Revision History

## 2023-09-15 NOTE — Progress Notes (Signed)
 Occupational Therapy Assessment and Plan  Patient Details  Name: Austin Bright MRN: [id number] Date of Birth: 1945/12/06  OT Diagnosis: abnormal posture, acute pain, and muscle weakness (generalized) Rehab Potential: Rehab Potential (ACUTE ONLY): Good ELOS: 10-12 days   Today's Date: 09/15/2023 OT Individual Time: 1250-1345 OT Individual Time Calculation (min): 55 min     Hospital Problem: Principal Problem:   Cauda equina compression [facility])   Past Medical History:  Past Medical History:  Diagnosis Date   Acute diverticulitis 08/17/2020   Acute MI (HCC) 08/2011   Arthritis    Bilateral pneumonia    Diagnosed after STEMI 08/2011   CAD (coronary artery disease)    a. Diagnosed 08/2011 with anterior STEMI due to thrombotic occlusion of mid LAD s/p thrombectomy, PTCA, DES placement 08/23/11.   CHF (congestive heart failure) (HCC)    Chronic combined systolic and diastolic congestive heart failure (HCC) 09/15/2011   Dyslipidemia    Gout    Hypertension    Ischemic cardiomyopathy    a. Initial EF 35% by cath 08/23/11, improved to 40-45% by echo 08/25/11. 50-55% by echo November 2013.   Rotator cuff tear arthropathy, right    Shortness of breath    Past Surgical History:  Past Surgical History:  Procedure Laterality Date   carpel tunnel Bilateral 2002   COLONOSCOPY WITH PROPOFOL  N/A 04/08/2017   Surgeon: [name], MD;  diverticulosis in sigmoid and descending colon, internal hemorrhoids, otherwise normal exam.  No recommendations to repeat due to age.   COLONOSCOPY WITH PROPOFOL  N/A 03/17/2021   Procedure: COLONOSCOPY WITH PROPOFOL ;  Surgeon: [name], DO;  Location: [facility];  Service: Endoscopy;  Laterality: N/A;  8:00am   CORONARY STENT PLACEMENT     CYSTECTOMY  1969   pilonidal cyst   LEFT AND RIGHT HEART CATHETERIZATION WITH CORONARY ANGIOGRAM N/A 09/15/2011   Procedure: LEFT AND RIGHT HEART CATHETERIZATION WITH CORONARY ANGIOGRAM;  Surgeon: [name], MD;  Location: [facility] CATH LAB;  Service: Cardiovascular;  Laterality: N/A;   LEFT HEART CATH N/A 08/23/2011   Procedure: LEFT HEART CATH;  Surgeon: [name], MD;  Location: [facility] CATH LAB;  Service: Cardiovascular;  Laterality: N/A;   NO PAST SURGERIES     PERCUTANEOUS CORONARY STENT INTERVENTION (PCI-S)  08/23/2011   Procedure: PERCUTANEOUS CORONARY STENT INTERVENTION (PCI-S);  Surgeon: [name], MD;  Location: [facility] CATH LAB;  Service: Cardiovascular;;    Assessment & Plan Clinical Impression: Austin Bright is a 78 year old male with history of CAD/ICM with combined CHF, HTN, gout, new onset of LBP who was  seen in ED 09/09/23 and treated with dose of decadron  and discharged to home with pain meds and muscle relaxer due to likely lumbar strain. He presented to ED on 09/11/23 with reports of progressive pain, inability to walk, perineal numbness and urinary retention with inability to void. MRI lumbar spine revealed severe spinal stenosis L3/L4, L4/L5, bulky disc extrusion L4-L5 and moderate spinal stenosis L2/L3. Bladder scanned for 480 cc and foley placed for cauda equina syndrome.  He was taken to OR emergently for bilateral L4 and L5 laminectomy for decompression of nerve roots and interbody fusion by Dr. Mavis on the same day.  Post op LSO to be donned at EOB. Sensation BLE improving but he continues to have bilateral knee instability, foley removed yesterday and reported to be voiding.  Reports he feels bladder function improving and reports he has been continent. Reports  continent bowel movement today. Reports sensation was intact during BM.    Hx of Rotator cuff tear R shoulder. Treated non-surgically.   Reports soreness in his lower back but overall pain is controlled. Continued altered sensation in his lower legs, feet and groin.    PT/OT has been working with patient who requires min to mod assist with mobility and min assist with ADL tasks. 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 CIR recommended due to functional decline. Patient transferred to CIR on 09/14/2023 .    Patient currently requires min with basic self-care skills secondary to muscle weakness and muscle joint tightness, decreased cardiorespiratoy endurance, and decreased standing balance, decreased postural control, and decreased balance strategies.  Prior to hospitalization, patient could complete ADLs with independent .  Patient will benefit from skilled intervention to increase independence with basic self-care skills prior to discharge home with care partner.  Anticipate patient will require intermittent supervision and no further OT follow recommended.  OT - End of Session Activity Tolerance: Tolerates 30+ min activity with multiple rests Endurance Deficit: Yes Endurance Deficit Description: generalized BLE weakness OT Assessment Rehab Potential (ACUTE ONLY): Good OT Barriers to Discharge: Inaccessible home environment OT Barriers to Discharge Comments: flight of stairs to get to bedroom/bathroom OT Patient demonstrates impairments in the following area(s): Balance;Pain;Safety;Sensory;Edema;Endurance;Skin Integrity;Motor OT Basic ADL's Functional Problem(s): Bathing;Dressing;Toileting OT Advanced ADL's Functional Problem(s): None OT Transfers Functional Problem(s): Toilet;Tub/Shower OT Additional Impairment(s): None OT Plan OT Intensity: Minimum of 1-2 x/day, 45 to 90 minutes OT Frequency: 5 out of 7 days OT Duration/Estimated Length of Stay: 10-12 days OT Treatment/Interventions: Balance/vestibular training;Discharge planning;Self Care/advanced ADL retraining;Pain management;Therapeutic Activities;UE/LE Coordination activities;Therapeutic Exercise;Skin care/wound managment;Patient/family education;Functional mobility training;Community reintegration;DME/adaptive equipment instruction;Psychosocial support;UE/LE Strength  taining/ROM OT Self Feeding Anticipated Outcome(s): no goal OT Basic Self-Care Anticipated Outcome(s): (S) OT Toileting Anticipated Outcome(s): (S) OT Bathroom Transfers Anticipated Outcome(s): (S) OT Recommendation Recommendations for Other Services: None Patient destination: Home Follow Up Recommendations: None Equipment Recommended: 3 in 1 bedside comode   OT Evaluation Precautions/Restrictions  Precautions Precautions: Back;Fall Precaution Booklet Issued: No Recall of Precautions/Restrictions: Intact Precaution/Restrictions Comments: pt able to recall 1/3, educated on 3/3 back precautions Required Braces or Orthoses: Spinal Brace Spinal Brace: Lumbar corset Restrictions Weight Bearing Restrictions Per Provider Order: No General Chart Reviewed: Yes OT Amount of Missed Time: 15 Minutes Family/Caregiver Present: Yes Vital Signs Therapy Vitals Temp: 98.7 F (37.1 C) Temp Source: Oral Pulse Rate: 85 Resp: 18 BP: 108/79 Patient Position (if appropriate): Sitting Oxygen Therapy SpO2: 94 % O2 Device: Room Air Pain   Home Living/Prior Functioning Home Living Available Help at Discharge: Family, Available 24 hours/day Type of Home: House Home Access: Stairs to enter Secretary/administrator of Steps: 2 Entrance Stairs-Rails: None Home Layout: Two level, Bed/bath upstairs Alternate Level Stairs-Number of Steps: full flight to get upstairs Alternate Level Stairs-Rails: Right Bathroom Shower/Tub: Health visitor: Handicapped height Additional Comments: owns a rollator  Lives With: Spouse IADL History Homemaking Responsibilities: Yes Meal Prep Responsibility: Primary Laundry Responsibility: Primary Cleaning Responsibility: Primary Bill Paying/Finance Responsibility: Primary Current License: Yes Occupation: Full time employment 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 and Hobbies: Golf, Pharmacist, community Prior Function Level of  Independence: Independent with basic ADLs, Independent with transfers, Independent with homemaking with ambulation, Independent with gait  Able to Take Stairs?: Yes Driving: Yes Vocation: Retired Administrator, sports Baseline Vision/History: 1 Wears glasses Ability to See in Adequate  Light: 0 Adequate Patient Visual Report: No change from baseline Vision Assessment?: No apparent visual deficits Perception  Perception: Within Functional Limits Praxis Praxis: WFL Cognition Cognition Overall Cognitive Status: Within Functional Limits for tasks assessed Arousal/Alertness: Awake/alert Memory: Appears intact Attention: Focused Focused Attention: Appears intact Awareness: Appears intact Problem Solving: Appears intact Safety/Judgment: Appears intact Brief Interview for Mental Status (BIMS) Repetition of Three Words (First Attempt): 3 Temporal Orientation: Year: Correct Temporal Orientation: Month: Accurate within 5 days Temporal Orientation: Day: Correct Recall: Sock: Yes, no cue required Recall: Blue: Yes, no cue required Recall: Bed: Yes, no cue required BIMS Summary Score: 15 Sensation Sensation Light Touch: Impaired Detail Central sensation comments: Pt reports improving daily- overall intact from he reports abnormal sensation in bottom of feet especially and on inside of calf Light Touch Impaired Details: Impaired LLE;Impaired RLE Hot/Cold: Appears Intact Proprioception: Impaired by gross assessment Stereognosis: Impaired by gross assessment Coordination Gross Motor Movements are Fluid and Coordinated: No Fine Motor Movements are Fluid and Coordinated: Yes Coordination and Movement Description: BLE weakness and sensation changes Motor  Motor Motor: Abnormal postural alignment and control  Trunk/Postural Assessment  Cervical Assessment Cervical Assessment: Within Functional Limits Thoracic Assessment Thoracic Assessment: Exceptions to [facility] (back sx with brace) Lumbar  Assessment Lumbar Assessment: Within Functional Limits Postural Control Postural Control: Deficits on evaluation Protective Responses: delayed  Balance Balance Balance Assessed: Yes Static Sitting Balance Static Sitting - Balance Support: Feet supported Static Sitting - Level of Assistance: 6: Modified independent (Device/Increase time) Dynamic Sitting Balance Dynamic Sitting - Balance Support: Feet supported Dynamic Sitting - Level of Assistance: 5: Stand by assistance Static Standing Balance Static Standing - Balance Support: Bilateral upper extremity supported Static Standing - Level of Assistance: 4: Min assist Dynamic Standing Balance Dynamic Standing - Balance Support: Bilateral upper extremity supported Dynamic Standing - Level of Assistance: 4: Min assist Dynamic Standing - Balance Activities: Lateral lean/weight shifting Extremity/Trunk Assessment RUE Assessment RUE Assessment: Exceptions to [facility] General Strength Comments: Hx of rotator cuff tear but WNL AROM LUE Assessment LUE Assessment: Within Functional Limits  Care Tool Care Tool Self Care Eating   Eating Assist Level: Independent    Oral Care    Oral Care Assist Level: Independent    Bathing   Body parts bathed by patient: Right arm;Left arm;Chest;Abdomen;Front perineal area;Buttocks;Right upper leg;Left upper leg;Right lower leg;Left lower leg;Face     Assist Level: Minimal Assistance - Patient > 75%    Upper Body Dressing(including orthotics)   What is the patient wearing?: Pull over shirt;Orthosis Orthosis activity level: Performed by patient Assist Level: Minimal Assistance - Patient > 75%    Lower Body Dressing (excluding footwear)   What is the patient wearing?: Pants Assist for lower body dressing: Minimal Assistance - Patient > 75%    Putting on/Taking off footwear   What is the patient wearing?: Shoes Assist for footwear: Minimal Assistance - Patient > 75%       Care Tool  Toileting Toileting activity   Assist for toileting: Minimal Assistance - Patient > 75%     Care Tool Bed Mobility Roll left and right activity   Roll left and right assist level: Contact Guard/Touching assist    Sit to lying activity   Sit to lying assist level: Contact Guard/Touching assist    Lying to sitting on side of bed activity   Lying to sitting on side of bed assist level: the ability to move from lying on the back to sitting on the  side of the bed with no back support.: Contact Guard/Touching assist     Care Tool Transfers Sit to stand transfer   Sit to stand assist level: Minimal Assistance - Patient > 75%    Chair/bed transfer   Chair/bed transfer assist level: Minimal Assistance - Patient > 75%     Toilet transfer   Assist Level: Minimal Assistance - Patient > 75%     Care Tool Cognition  Expression of Ideas and Wants Expression of Ideas and Wants: 4. Without difficulty (complex and basic) - expresses complex messages without difficulty and with speech that is clear and easy to understand  Understanding Verbal and Non-Verbal Content Understanding Verbal and Non-Verbal Content: 4. Understands (complex and basic) - clear comprehension without cues or repetitions   Memory/Recall Ability Memory/Recall Ability : Current season;Location of own room;Staff names and faces;That he or she is in a hospital/hospital unit   Refer to Care Plan for Long Term Goals  SHORT TERM GOAL WEEK 1 OT Short Term Goal 1 (Week 1): Pt will don LB clothing with (S) OT Short Term Goal 2 (Week 1): Pt will complete stand pivots with CGA OT Short Term Goal 3 (Week 1): Pt will complete toileting tasks with (S)  Recommendations for other services: None    Skilled Therapeutic Intervention ADL ADL Eating: (P) Independent Where Assessed-Eating: (P) Wheelchair Grooming: (P) Supervision/safety Where Assessed-Grooming: (P) Sitting at sink Upper Body Bathing: (P) Supervision/safety Where  Assessed-Upper Body Bathing: (P) Shower Lower Body Bathing: (P) Minimal assistance Mobility  Bed Mobility Bed Mobility: Rolling Left;Left Sidelying to Sit;Supine to Sit Rolling Left: Contact Guard/Touching assist Left Sidelying to Sit: Contact Guard/Touching assist Supine to Sit: Contact Guard/Touching assist Transfers Sit to Stand: Minimal Assistance - Patient > 75% Stand to Sit: Minimal Assistance - Patient > 75%   Skilled OT evaluation completed with the creation of pt centered OT POC. Pt educated on condition, ELOS, rehab expectations, and fall risk reduction strategies throughout session. He completed full shower as described above. He required cueing/education for strategies to adhere to back precautions during b/d. He completed functional mobility in the room with heavy reliance on the BUE during standing. He had one LOB in standing with removal of BUE but was able to recover with min A. Pt was left sitting up in the wheelchair with all needs met and call bell within reach. 15 min missed d/t pt being in x-ray   Discharge Criteria: Patient will be discharged from OT if patient refuses treatment 3 consecutive times without medical reason, if treatment goals not met, if there is a change in medical status, if patient makes no progress towards goals or if patient is discharged from hospital.  The above assessment, treatment plan, treatment alternatives and goals were discussed and mutually agreed upon: by patient and by family  Nena VEAR Moats 09/15/2023, 2:34 PM

## 2023-09-15 NOTE — Progress Notes (Signed)
 Patient refused to do a digital stimulation, instead wanting just  his dulcolax suppository  and wanted to use a bathroom by himself, stating he had a good  bowel moment this morning.

## 2023-09-16 DIAGNOSIS — G834 Cauda equina syndrome: Secondary | ICD-10-CM | POA: Diagnosis not present

## 2023-09-16 DIAGNOSIS — R7401 Elevation of levels of liver transaminase levels: Secondary | ICD-10-CM | POA: Diagnosis not present

## 2023-09-16 DIAGNOSIS — K592 Neurogenic bowel, not elsewhere classified: Secondary | ICD-10-CM | POA: Diagnosis not present

## 2023-09-16 DIAGNOSIS — R7989 Other specified abnormal findings of blood chemistry: Secondary | ICD-10-CM | POA: Diagnosis not present

## 2023-09-16 LAB — CBC
HCT: 43.3 % (ref 39.0–52.0)
Hemoglobin: 15.7 g/dL (ref 13.0–17.0)
MCH: 32.5 pg (ref 26.0–34.0)
MCHC: 36.3 g/dL — ABNORMAL HIGH (ref 30.0–36.0)
MCV: 89.6 fL (ref 80.0–100.0)
Platelets: 204 K/uL (ref 150–400)
RBC: 4.83 MIL/uL (ref 4.22–5.81)
RDW: 13.1 % (ref 11.5–15.5)
WBC: 10.7 K/uL — ABNORMAL HIGH (ref 4.0–10.5)
nRBC: 0 % (ref 0.0–0.2)

## 2023-09-16 LAB — COMPREHENSIVE METABOLIC PANEL WITH GFR
ALT: 149 U/L — ABNORMAL HIGH (ref 0–44)
AST: 116 U/L — ABNORMAL HIGH (ref 15–41)
Albumin: 3.4 g/dL — ABNORMAL LOW (ref 3.5–5.0)
Alkaline Phosphatase: 50 U/L (ref 38–126)
Anion gap: 12 (ref 5–15)
BUN: 31 mg/dL — ABNORMAL HIGH (ref 8–23)
CO2: 27 mmol/L (ref 22–32)
Calcium: 9 mg/dL (ref 8.9–10.3)
Chloride: 96 mmol/L — ABNORMAL LOW (ref 98–111)
Creatinine, Ser: 1.22 mg/dL (ref 0.61–1.24)
GFR, Estimated: >60 mL/min (ref >60)
Glucose, Bld: 118 mg/dL — ABNORMAL HIGH (ref 70–99)
Potassium: 3.4 mmol/L — ABNORMAL LOW (ref 3.5–5.1)
Sodium: 135 mmol/L (ref 135–145)
Total Bilirubin: 1.2 mg/dL (ref 0.0–1.2)
Total Protein: 6.6 g/dL (ref 6.5–8.1)

## 2023-09-16 MED ORDER — POTASSIUM CHLORIDE CRYS ER 20 MEQ PO TBCR
20.0000 meq | EXTENDED_RELEASE_TABLET | Freq: Every day | ORAL | Status: DC
  Administered 2023-09-16 – 2023-09-22 (×7): 20 meq via ORAL
  Filled 2023-09-16 (×7): qty 1

## 2023-09-16 NOTE — Plan of Care (Signed)
  Problem: Consults Goal: RH SPINAL CORD INJURY PATIENT EDUCATION Description:  See Patient Education module for education specifics.  Outcome: Progressing   Problem: SCI BOWEL ELIMINATION Goal: RH STG MANAGE BOWEL WITH ASSISTANCE Description: STG Manage Bowel with  mod I Assistance. Outcome: Progressing   Problem: SCI BLADDER ELIMINATION Goal: RH STG MANAGE BLADDER WITH ASSISTANCE Description: STG Manage Bladder With  mod I Assistance Outcome: Progressing   Problem: RH SKIN INTEGRITY Goal: RH STG SKIN FREE OF INFECTION/BREAKDOWN Description: Manage skin free of infection with mod I assistance Outcome: Progressing   Problem: RH SAFETY Goal: RH STG ADHERE TO SAFETY PRECAUTIONS W/ASSISTANCE/DEVICE Description: STG Adhere to Safety Precautions With  mod I Assistance/Device. Outcome: Progressing   Problem: RH PAIN MANAGEMENT Goal: RH STG PAIN MANAGED AT OR BELOW PT'S PAIN GOAL Description: < 4 w/ prns Outcome: Progressing   Problem: RH KNOWLEDGE DEFICIT SCI Goal: RH STG INCREASE KNOWLEDGE OF SELF CARE AFTER SCI Description: Manage increase  knowledge of self care after SCI with mod I assistance from spouse using educational materials provided  Outcome: Progressing

## 2023-09-16 NOTE — Progress Notes (Signed)
 Physical Therapy Session Note  Patient Details  Name: Austin Bright MRN: [id number] Date of Birth: 02-28-1945  Today's Date: 09/16/2023 PT Individual Time: 0800-0915 PT Individual Time Calculation (min): 75 min   Short Term Goals: Week 1:  PT Short Term Goal 1 (Week 1): Pt will amb up to 150' with RW supervision with decreased scissor pattern PT Short Term Goal 2 (Week 1): Pt will demonstrate sit to stand from low surface ie w/c with min/CGA following proper sequencing PT Short Term Goal 3 (Week 1): Pt will be able to verbalize 3/3 back precautions  Skilled Therapeutic Interventions/Progress Updates:   Pt received supine in bed, agreeable to therapy. Pt dons LSO w/ supervision seated EOB. Bed mobility supervision. Pt sit to stand w/ minA from lower bed surface w/ RW. Pt ambulated to toilet w/ RW and CGA, able to doff pants and sit w/ CGA. Pt performed pericare w/ supervision and ambulated back to [facility] w/ CGA.   Pt participated in [employer] training for ~67min of postural sway self-correction, able to center himself w/ mod cueing and good carryover. Pt progressed to one UE support to no UE support, able to maintain balance. Pt required CGA for safety. Pt took one seated rest break due to slight increase in back pain (self reported 4+/10 went to a 5/10, rest break dropped pain 0/10). Pain addressed with physical activity.  Pt ambulated w/ RW and CGA 68ft to improve functional activity tolerance, gait quality, and decrease fall risk. Pt demonstrated narrow BOS, decreased cadence.  Pt participated in SLS balance exercises in RW, progressing to no UE support. Pt then cued to coordinate LE w/ contralateral UE to balance w/ one UE support on RW. Pt given minA for safety. Exercises performed to strengthen hip abductors, stabilizers during gait, improve pt self-efficacy in gait, improve functional activity tolerance, and decrease fall risk.  At end of session, pt wheeled back to room, left upright in [facility]  w/ call bell and all other needs in reach.    Therapy Documentation Precautions:  Precautions Precautions: Back, Fall Precaution Booklet Issued: No Recall of Precautions/Restrictions: Intact Precaution/Restrictions Comments: pt able to recall 1/3, educated on 3/3 back precautions Required Braces or Orthoses: Spinal Brace Spinal Brace: Lumbar corset Restrictions Weight Bearing Restrictions Per Provider Order: No   Therapy/Group: Individual Therapy  Oneil Grumbles 09/16/2023, 12:39 PM

## 2023-09-16 NOTE — Progress Notes (Signed)
 Physical Therapy Session Note  Patient Details  Name: Austin Bright MRN: [id number] Date of Birth: 1945/01/29  Today's Date: 09/16/2023 PT Individual Time: 1303-1420 PT Individual Time Calculation (min): 77 min   Short Term Goals: Week 1:  PT Short Term Goal 1 (Week 1): Pt will amb up to 150' with RW supervision with decreased scissor pattern PT Short Term Goal 2 (Week 1): Pt will demonstrate sit to stand from low surface ie w/c with min/CGA following proper sequencing PT Short Term Goal 3 (Week 1): Pt will be able to verbalize 3/3 back precautions  Skilled Therapeutic Interventions/Progress Updates: Pt presented in w/c stating urgency for BM. Pt stood with CGA and ambulated CGA to toilet. Completed toilet transfers with CGA and LB clothing management with supervision (continent urinary void/BM). Pt was able to complete peri-care mod I. Pt stood with CGA and ambulated to sink in same manner as prior to complete hand hygiene in standing. Pt noted to demonstrate mild B knee flexion and leaning against sink for support. Pt then took seated rest and propelled w/c to day room. In day room pt completed stand step transfer to mat. Participated in Sit to stand with use of tidal weight 2 x 5 for increased dynamic balance challenge and increased BLE strengthening. Pt then ambulated over to Cybex Kinetron and performed Sit to stand x 10 with BUE resting on arm rests. With mirror feedback pt noted to have increased weight shifting on RLE however improved with repetition. While on Kinetron participated in standing march at 20cm/sec 2 x 10. Pt then ambulated to Lite Gait and set up in harness for high intensity gait training for endurance. Pt set up and completed step up on the treadmill with minA. Pt completed x 3 bouts of gait as noted below. During second bout PTA placed bean bags for increased step height/clearance. Pt required seated seated rests between bouts but of note he was able to consistently step  with feet separated between white lines. Pt with noted fatigue after activity therefore carryover gait back to room not performed. In w/c pt propelled back to room and remained in w/c at end of session with call bell within reach and needs met.   3 min x1 mph 276ft 2:50 0. 140ft with bean bag steps 3 min 0.9mph 222ft      Therapy Documentation Precautions:  Precautions Precautions: Back, Fall Precaution Booklet Issued: No Recall of Precautions/Restrictions: Intact Precaution/Restrictions Comments: pt able to recall 1/3, educated on 3/3 back precautions Required Braces or Orthoses: Spinal Brace Spinal Brace: Lumbar corset Restrictions Weight Bearing Restrictions Per Provider Order: No General:   Vital Signs: Therapy Vitals Temp: 98.1 F (36.7 C) Temp Source: Oral Pulse Rate: 94 Resp: 18 BP: 94/65 Patient Position (if appropriate): Sitting Oxygen Therapy SpO2: 98 % O2 Device: Room Air   Therapy/Group: Individual Therapy  Braydyn Schultes 09/16/2023, 4:24 PM

## 2023-09-16 NOTE — Progress Notes (Signed)
 PROGRESS NOTE   Subjective/Complaints: Had a much better night. Very complimentary of therapy staff. Emptying bladder. Had suppository last night with bm but had a lot of rectal spasms as a result. Says he doesn't do well with suppositories. Had urge to empty before supp placed  ROS: Patient denies fever, rash, sore throat, blurred vision, dizziness, nausea, vomiting, diarrhea, cough, shortness of breath or chest pain, joint or back/neck pain, headache, or mood change.   Objective:   VAS US  LOWER EXTREMITY VENOUS (DVT) Result Date: 09/15/2023  Lower Venous DVT Study Patient Name:  Austin Bright  Date of Exam:   09/15/2023 Medical Rec #: [id number]           Accession #:    [id number] Date of Birth: 09/06/1945           Patient Gender: M Patient Age:   78 years Exam Location:  [facility] Procedure:      VAS US  LOWER EXTREMITY VENOUS (DVT) Referring Phys: PAMELA LOVE --------------------------------------------------------------------------------  Indications: Immobility.  Performing Technologist: Elmarie Lindau, RVT  Examination Guidelines: A complete evaluation includes B-mode imaging, spectral Doppler, color Doppler, and power Doppler as needed of all accessible portions of each vessel. Bilateral testing is considered an integral part of a complete examination. Limited examinations for reoccurring indications may be performed as noted. The reflux portion of the exam is performed with the patient in reverse Trendelenburg.  +---------+---------------+---------+-----------+----------+--------------+ RIGHT    CompressibilityPhasicitySpontaneityPropertiesThrombus Aging +---------+---------------+---------+-----------+----------+--------------+ CFV      Full           Yes      Yes                                 +---------+---------------+---------+-----------+----------+--------------+ SFJ      Full                                                         +---------+---------------+---------+-----------+----------+--------------+ FV Prox  Full                                                        +---------+---------------+---------+-----------+----------+--------------+ FV Mid   Full                                                        +---------+---------------+---------+-----------+----------+--------------+ FV DistalFull                                                        +---------+---------------+---------+-----------+----------+--------------+  PFV      Full                                                        +---------+---------------+---------+-----------+----------+--------------+ POP      Full           Yes      Yes                                 +---------+---------------+---------+-----------+----------+--------------+ PTV      Full                                                        +---------+---------------+---------+-----------+----------+--------------+ PERO     Full                                                        +---------+---------------+---------+-----------+----------+--------------+   +---------+---------------+---------+-----------+----------+--------------+ LEFT     CompressibilityPhasicitySpontaneityPropertiesThrombus Aging +---------+---------------+---------+-----------+----------+--------------+ CFV      Full           Yes      Yes                                 +---------+---------------+---------+-----------+----------+--------------+ SFJ      Full                                                        +---------+---------------+---------+-----------+----------+--------------+ FV Prox  Full                                                        +---------+---------------+---------+-----------+----------+--------------+ FV Mid   Full                                                         +---------+---------------+---------+-----------+----------+--------------+ FV DistalFull                                                        +---------+---------------+---------+-----------+----------+--------------+ PFV      Full                                                        +---------+---------------+---------+-----------+----------+--------------+  POP      Full           Yes      Yes                                 +---------+---------------+---------+-----------+----------+--------------+ PTV      Full                                                        +---------+---------------+---------+-----------+----------+--------------+ PERO     Full                                                        +---------+---------------+---------+-----------+----------+--------------+     Summary: RIGHT: - There is no evidence of deep vein thrombosis in the lower extremity.  - No cystic structure found in the popliteal fossa.  LEFT: - There is no evidence of deep vein thrombosis in the lower extremity.  - No cystic structure found in the popliteal fossa.  *See table(s) above for measurements and observations. Electronically signed by Lonni Gaskins MD on 09/15/2023 at 10:35:36 AM.    Final    Recent Labs    09/15/23 0431 09/16/23 0509  WBC 10.2 10.7*  HGB 14.7 15.7  HCT 41.1 43.3  PLT 170 204   Recent Labs    09/15/23 0431 09/16/23 0509  NA 136 135  K 3.5 3.4*  CL 97* 96*  CO2 27 27  GLUCOSE 117* 118*  BUN 33* 31*  CREATININE 1.32* 1.22  CALCIUM  8.8* 9.0    Intake/Output Summary (Last 24 hours) at 09/16/2023 1128 Last data filed at 09/16/2023 9177 Gross per 24 hour  Intake 420 ml  Output 350 ml  Net 70 ml        Physical Exam: Vital Signs Blood pressure 113/73, pulse 82, temperature 98.3 F (36.8 C), resp. rate 20, height 5' 10 (1.778 m), weight 94 kg, SpO2 98%.  Constitutional: No distress . Vital signs reviewed. HEENT: NCAT, EOMI,  oral membranes moist Neck: supple Cardiovascular: RRR without murmur. No JVD    Respiratory/Chest: CTA Bilaterally without wheezes or rales. Normal effort    GI/Abdomen: BS +, non-tender, non-distended Ext: no clubbing, cyanosis, or edema Psych: pleasant and cooperative  Skin: Clean and intact without signs of breakdown. Back incision remains CDI with honeycomb dressing in place.  Neuro:  Alert and oriented x 3. Normal insight and awareness. Intact Memory. Normal language and speech. Cranial nerve exam unremarkable. MMT: BUE 5/5. BLE 4/5 prox to distal. Sensory diminished at 1/2 in L4-S3 dermatomes.Has rectal numbness.  DTR's trace in LE's. Good sitting balance up in chair.   Musculoskeletal: LB tender with ROM/palpation    Assessment/Plan: 1. Functional deficits which require 3+ hours per day of interdisciplinary therapy in a comprehensive inpatient rehab setting. Physiatrist is providing close team supervision and 24 hour management of active medical problems listed below. Physiatrist and rehab team continue to assess barriers to discharge/monitor patient progress toward functional and medical goals  Care Tool:  Bathing    Body parts bathed by patient: Right arm, Left arm, Chest, Abdomen, Front  perineal area, Buttocks, Right upper leg, Left upper leg, Right lower leg, Left lower leg, Face         Bathing assist Assist Level: Minimal Assistance - Patient > 75%     Upper Body Dressing/Undressing Upper body dressing   What is the patient wearing?: Pull over shirt, Orthosis Orthosis activity level: Performed by patient  Upper body assist Assist Level: Minimal Assistance - Patient > 75%    Lower Body Dressing/Undressing Lower body dressing      What is the patient wearing?: Pants     Lower body assist Assist for lower body dressing: Minimal Assistance - Patient > 75%     Toileting Toileting    Toileting assist Assist for toileting: Minimal Assistance - Patient > 75%      Transfers Chair/bed transfer  Transfers assist     Chair/bed transfer assist level: Minimal Assistance - Patient > 75%     Locomotion Ambulation   Ambulation assist      Assist level: Minimal Assistance - Patient > 75% Assistive device: Walker-rolling Max distance: 45   Walk 10 feet activity   Assist     Assist level: Contact Guard/Touching assist Assistive device: Walker-rolling   Walk 50 feet activity   Assist Walk 50 feet with 2 turns activity did not occur: Safety/medical concerns (fatigue)         Walk 150 feet activity   Assist Walk 150 feet activity did not occur: Safety/medical concerns (fatigue)         Walk 10 feet on uneven surface  activity   Assist Walk 10 feet on uneven surfaces activity did not occur: Safety/medical concerns (fatigue)         Wheelchair     Assist Is the patient using a wheelchair?: Yes Type of Wheelchair: Manual    Wheelchair assist level: Total Assistance - Patient < 25%      Wheelchair 50 feet with 2 turns activity    Assist        Assist Level: Total Assistance - Patient < 25%   Wheelchair 150 feet activity     Assist      Assist Level: Total Assistance - Patient < 25%   Blood pressure 113/73, pulse 82, temperature 98.3 F (36.8 C), resp. rate 20, height 5' 10 (1.778 m), weight 94 kg, SpO2 98%.  Medical Problem List and Plan: 1. Functional deficits secondary to Cauda Equina syndrome/Lumbar Radiculoapthy s/p  L4-5 decompression and fusion by Dr. Mavis on 09/11/2023              -patient may shower, cover incision please             -ELOS/Goals: 7 days, PT/OT mod I to Sup             -Continue CIR therapies including PT, OT   2.  Antithrombotics:  -DVT/anticoagulation:  lovenox              -antiplatelet therapy: ASA 81 3. Pain Management: Tylenol  prn. Oxycodone  prn. Continue Robaxin  500 mg BID--change to prn.  4. Mood/Behavior/Sleep: LCSW to follow for evaluation and support.               --Melatonin prn for insomnia.              -antipsychotic agents: N/A 5. Neuropsych/cognition: This patient is capable of making decisions on his own behalf. 6. Skin/Wound Care: Routine pressure relief measures.  7. Fluids/Electrolytes/Nutrition: Monitor I/O. Check CMET in am 8.  HTN:  Monitor BP TID--on Zestril  and Toprol  XL. Will set decrease Zestril  to 10 mg and place hold parameters.  Monitor with activity.  --Blood pressures soft -  orthostatic vitals appear unremarkable so far - TEDs ordered. Binder prn.   -acclimate 9. CAD/ICM/chronic combined CHF: Monitor weight daily and for signs of overload.             --Heart healthy diet. On Toprol , Zestril , chorthalidone, Crestor . Off ASA due to surgery             --monitor for symptoms with increase in activity. 10. Abnormal LFTs: ALT-65/AST-90/TB-1.6 @ admission.              --LFT's continue to climb--in 100's today   -pt on zetia  and crestor  but these are chronic meds   -may be just reactive   -will reach out to pharmacy  -8/28--sl increase again in LFT's. Unclear etiology (116/149)   -will recheck tomorrow   -potassium a little low 3.4, will add supp 11. Leukocytosis: WBC up to 10.6 @ admission likely due to steroids. Will recheck in am.             --no  fevers and other signs of infection.   -8/28 10.7, observe only for now  12 Neurogenic bowel: On Miralax  and colace-->had positive results with suppository yesterday --started on bowel program --suppositories cause significant spasms, will stop. He's having sensation, urge to empty, too -continue orals in AM, up to toilet in PM    13. Neurogenic bladder?: On Flomax -->change to evenings to avoid hypotension.  Monitor PVR/Bladder scan  -EOB or toilet to void, timed voids  14. Pre-renal azotemia: BUN-34 @ admission.    -BUN/Cr a little worse today  -push fluids  -hold hygroton   -8/28 labs a little better today, continue to push fluids 15.  Pre-diabetes: Hgb A1C- 6.2  (08/02/23)--started on Wegovy  a few months ago.     LOS: 2 days A FACE TO FACE EVALUATION WAS PERFORMED  Arthea ONEIDA Gunther 09/16/2023, 11:28 AM

## 2023-09-16 NOTE — Progress Notes (Signed)
 Occupational Therapy Session Note  Patient Details  Name: Austin Bright MRN: [id number] Date of Birth: 11/21/1945  Today's Date: 09/16/2023 OT Individual Time: 0930-1040 OT Individual Time Calculation (min): 70 min    Short Term Goals: Week 1:  OT Short Term Goal 1 (Week 1): Pt will don LB clothing with (S) OT Short Term Goal 2 (Week 1): Pt will complete stand pivots with CGA OT Short Term Goal 3 (Week 1): Pt will complete toileting tasks with (S)  Skilled Therapeutic Interventions/Progress Updates:    Pt resting in w/c upon arrival. Pt's wife joined session shortly after start. Pt dressed and slightly fatigued from earlier PT session. Skilled OT intervention with focus on functional transfers, static and dynamic standing balance, activity tolerance, and discharge planning to increase pt's independencve with BADLs. All transfers with CGA. Standing activities: corn hole, retrieving bean bags from floor, corn hole on Airex, cleaning bean bags with BUE, ball toss on rebounder on compliant and noncompliant surface, and balance activity on Wii balance board. All standing activities with CGA. Pt reported some BLE fatigue after session but encouraged. Pt returned to room and remained in w/c with wife present. All needs within reach.   Therapy Documentation Precautions:  Precautions Precautions: Back, Fall Precaution Booklet Issued: No Recall of Precautions/Restrictions: Intact Precaution/Restrictions Comments: pt able to recall 1/3, educated on 3/3 back precautions Required Braces or Orthoses: Spinal Brace Spinal Brace: Lumbar corset Restrictions Weight Bearing Restrictions Per Provider Order: No Pain:  Pt c/o 3/10 lower back pain; meds admin during session   Therapy/Group: Individual Therapy  [name] 09/16/2023, 12:06 PM

## 2023-09-17 DIAGNOSIS — G834 Cauda equina syndrome: Secondary | ICD-10-CM | POA: Diagnosis not present

## 2023-09-17 LAB — COMPREHENSIVE METABOLIC PANEL WITH GFR
ALT: 135 U/L — ABNORMAL HIGH (ref 0–44)
AST: 87 U/L — ABNORMAL HIGH (ref 15–41)
Albumin: 3.1 g/dL — ABNORMAL LOW (ref 3.5–5.0)
Alkaline Phosphatase: 46 U/L (ref 38–126)
Anion gap: 12 (ref 5–15)
BUN: 32 mg/dL — ABNORMAL HIGH (ref 8–23)
CO2: 28 mmol/L (ref 22–32)
Calcium: 8.8 mg/dL — ABNORMAL LOW (ref 8.9–10.3)
Chloride: 94 mmol/L — ABNORMAL LOW (ref 98–111)
Creatinine, Ser: 1.26 mg/dL — ABNORMAL HIGH (ref 0.61–1.24)
GFR, Estimated: 59 mL/min — ABNORMAL LOW (ref >60)
Glucose, Bld: 117 mg/dL — ABNORMAL HIGH (ref 70–99)
Potassium: 3.6 mmol/L (ref 3.5–5.1)
Sodium: 134 mmol/L — ABNORMAL LOW (ref 135–145)
Total Bilirubin: 0.9 mg/dL (ref 0.0–1.2)
Total Protein: 6 g/dL — ABNORMAL LOW (ref 6.5–8.1)

## 2023-09-17 MED ORDER — ACETAMINOPHEN 325 MG PO TABS
325.0000 mg | ORAL_TABLET | ORAL | Status: DC | PRN
  Administered 2023-09-21: 325 mg via ORAL
  Filled 2023-09-17 (×2): qty 1

## 2023-09-17 NOTE — Progress Notes (Signed)
 IP Rehab Bowel Program Documentation   Bowel Program Start time [phone]  Dig Stim Indicated? No  Dig Stim Prior to Suppository or mini Enema X N/A  Output from dig stim: N/A  Ordered intervention: Suppository No , mini enema No ,   Repeat dig stim after Suppository or Mini enema  X N/A  Output? Moderate   Bowel Program Complete? Yes , handoff given  No  Patient Tolerated? Yes    Patient gotten up to toilet and had a moderate amount of stool without suppository and without dig stimulation.

## 2023-09-17 NOTE — Progress Notes (Signed)
 Physical Therapy Session Note  Patient Details  Name: Austin Bright MRN: [id number] Date of Birth: 17-Apr-1945  Today's Date: 09/17/2023 PT Individual Time: 1110-1200, 1300-1345 PT Individual Time Calculation (min): 50 min, 45 min   Short Term Goals: Week 1:  PT Short Term Goal 1 (Week 1): Pt will amb up to 150' with RW supervision with decreased scissor pattern PT Short Term Goal 2 (Week 1): Pt will demonstrate sit to stand from low surface ie w/c with min/CGA following proper sequencing PT Short Term Goal 3 (Week 1): Pt will be able to verbalize 3/3 back precautions  Skilled Therapeutic Interventions/Progress Updates:   AM Session:  Pt received supine in bed, agreeable to therapy. No c/o pain. Pt performed ambulatory transfer to bathroom w/ RW and CGA, independent w/ pericare and ambulated back to [facility] w/ CGA and RW for safety.   Interventions:  BERG assessment performed in main gym, using mat table for sitting/rest. Pt and pt wife Austin Bright educated on purpose of testing to identify pt specific impairments to tailor PT approach. Pt/pt wife verbalized understanding. BERG scoring listed below. In summary-- pt required maxA to prevent fall in single leg stance and alternating stepping exercises. Pt ambulated ~1106ft w/ RW and CGA, red TB around knees for error augmentation w/ improved hip abd coordination as pt demonstrated improved BOS. Gait performed to improve proprioceptive input via BLE, improve functional activity tolerance and cardiovascular endurance. Pt then performed single leg stance exercise w/ RW and minA, red TB around knees and tactile cueing of ipsilateral glute med w/ good carryover. Pt educated on importance of said muscle in single leg stance and in gait.   At end of session, pt self-propelled manual WC back to room w/ SBA for safety, and pt remained upright in [facility] for lunch w/ call bell and cell phone. Wife Austin Bright present in room.    PM Session:  Pt received upright in WC w/  no c/o pain and agreeable to therapy. Pt wheeled outside for community ambulation. Pt performed several sit to stand transfers w/ SBA to CGA, was cued to not use RW until in full standing w/ good carryover. Pt ambulated >271ft outdoors w/ RW and CGA to improve functional activity tolerance, cardiovascular endurance, and to improve patient affect. Pt encouraged to decrease use of BUE on RW w/ improved WB through BLE throughout session. Pt given several seated rest breaks. Pt ambulated over varying surfaces and uneven terrain including grass, concrete, brick, and asphalt to improve foot and ankle stabilizers, dynamic balance, community reintegration and decrease fall risk.   At end of session, pt remained upright in [facility] w/ call bell and all other needs in reach.   Therapy Documentation Precautions:  Precautions Precautions: Back, Fall Precaution Booklet Issued: No Recall of Precautions/Restrictions: Intact Precaution/Restrictions Comments: pt able to recall 1/3, educated on 3/3 back precautions Required Braces or Orthoses: Spinal Brace Spinal Brace: Lumbar corset Restrictions Weight Bearing Restrictions Per Provider Order: No  Balance: Standardized Balance Assessment Standardized Balance Assessment: Berg Balance Test Berg Balance Test Sit to Stand: Able to stand using hands after several tries Standing Unsupported: Able to stand safely 2 minutes Sitting with Back Unsupported but Feet Supported on Floor or Stool: Able to sit safely and securely 2 minutes Stand to Sit: Sits safely with minimal use of hands Transfers: Able to transfer with verbal cueing and /or supervision Standing Unsupported with Eyes Closed: Able to stand 10 seconds with supervision Standing Ubsupported with Feet Together: Able to  place feet together independently and stand for 1 minute with supervision From Standing, Reach Forward with Outstretched Arm: Reaches forward but needs supervision (able to reach 10 with  supervision) From Standing Position, Pick up Object from Floor: Able to pick up shoe, needs supervision From Standing Position, Turn to Look Behind Over each Shoulder: Turn sideways only but maintains balance (limited by back precautions) Turn 360 Degrees: Needs close supervision or verbal cueing Standing Unsupported, Alternately Place Feet on Step/Stool: Needs assistance to keep from falling or unable to try Standing Unsupported, One Foot in Front: Loses balance while stepping or standing Standing on One Leg: Unable to try or needs assist to prevent fall Total Score: 29   Therapy/Group: Individual Therapy  Austin Bright 09/17/2023, 12:44 PM

## 2023-09-17 NOTE — Progress Notes (Signed)
 Physical Therapy Session Note  Patient Details  Name: Austin Bright MRN: [id number] Date of Birth: 28-Feb-1945  Today's Date: 09/17/2023 PT Individual Time: 0900-0945 PT Individual Time Calculation (min): 45 min   Short Term Goals: Week 1:  PT Short Term Goal 1 (Week 1): Pt will amb up to 150' with RW supervision with decreased scissor pattern PT Short Term Goal 2 (Week 1): Pt will demonstrate sit to stand from low surface ie w/c with min/CGA following proper sequencing PT Short Term Goal 3 (Week 1): Pt will be able to verbalize 3/3 back precautions  Skilled Therapeutic Interventions/Progress Updates: Pt presented in bed with wife present agreeable to therapy. Pt states some soreness from gait training yesterday but no LBP. Pt completed bed mobility with supervision and use of bed features. Pt donned LSO with supervision maintaining spinal precautions. Doffed socks using figure four position and donned shoes mod I (slip ins). Pt stood with CGA and ambulated to day room ~165ft with noted improved BOS and minimal scissoring which was present primarily during turns. Pt then worked on a combination of stretching and balance placing clothespins on basketball net while standing on red wedge for gastroc stretch and increased recruitment of ankle strategy. After seated rest break pt removed all clothespins for additional stretch. Pt noted to begin to incorporate bimanual use during activity. Pt also participated in increased dynamic balance challenge standing on Airex and throwing basketball to hoop. Pt initially requiring minA with one episode of retropulsion however improved to CGA with pt able to perform without UE support. Pt then completed same task in stagger stance with pt requiring minA when leading with RLE vs CGA when leading with LLE. Prior to returning to room participated in seated hamstring strech 30sec x 3. Pt ambulated back to room in same manner as prior and left seated EOB. Pt left with  call bell within reach and needs met.      Therapy Documentation Precautions:  Precautions Precautions: Back, Fall Precaution Booklet Issued: No Recall of Precautions/Restrictions: Intact Precaution/Restrictions Comments: pt able to recall 1/3, educated on 3/3 back precautions Required Braces or Orthoses: Spinal Brace Spinal Brace: Lumbar corset Restrictions Weight Bearing Restrictions Per Provider Order: No General:   Vital Signs:   Pain: Pain Assessment Pain Scale: 0-10 Pain Score: 0-No pain Mobility:   Locomotion :    Trunk/Postural Assessment :    Balance: Standardized Balance Assessment Standardized Balance Assessment: Berg Balance Test Berg Balance Test Sit to Stand: Able to stand using hands after several tries Standing Unsupported: Able to stand safely 2 minutes Sitting with Back Unsupported but Feet Supported on Floor or Stool: Able to sit safely and securely 2 minutes Stand to Sit: Sits safely with minimal use of hands Transfers: Able to transfer with verbal cueing and /or supervision Standing Unsupported with Eyes Closed: Able to stand 10 seconds with supervision Standing Ubsupported with Feet Together: Able to place feet together independently and stand for 1 minute with supervision From Standing, Reach Forward with Outstretched Arm: Reaches forward but needs supervision (able to reach 10 with supervision) From Standing Position, Pick up Object from Floor: Able to pick up shoe, needs supervision From Standing Position, Turn to Look Behind Over each Shoulder: Turn sideways only but maintains balance (limited by back precautions) Turn 360 Degrees: Needs close supervision or verbal cueing Standing Unsupported, Alternately Place Feet on Step/Stool: Needs assistance to keep from falling or unable to try Standing Unsupported, One Foot in Front: Loses balance while  stepping or standing Standing on One Leg: Unable to try or needs assist to prevent fall Total Score:  29 Exercises:   Other Treatments:      Therapy/Group: Individual Therapy  Happy Begeman 09/17/2023, 12:13 PM

## 2023-09-17 NOTE — IPOC Note (Signed)
 Overall Plan of Care [facility]) Patient Details Name: Austin Bright MRN: [id number] DOB: January 16, 1946  Admitting Diagnosis: Cauda equina compression [facility])  Hospital Problems: Principal Problem:   Cauda equina compression [facility])     Functional Problem List: Nursing Bladder, Bowel, Edema, Endurance, Medication Management, Motor, Pain, Safety  PT Balance, Endurance, Motor, Pain, Safety, Skin Integrity  OT Balance, Pain, Safety, Sensory, Edema, Endurance, Skin Integrity, Motor  SLP    TR         Basic ADL's: OT Bathing, Dressing, Toileting     Advanced  ADL's: OT None     Transfers: PT Bed Mobility, Bed to Chair, Car, Occupational psychologist, Research scientist (life sciences): PT Ambulation, Educational psychologist, Psychologist, prison and probation services     Additional Impairments: OT None  SLP        TR      Anticipated Outcomes Item Anticipated Outcome  Self Feeding no goal  Swallowing      Basic self-care  (S)  Toileting  (S)   Bathroom Transfers (S)  Bowel/Bladder  manage bowels with medications/ manage bladder with medications  Transfers  mod indep  Locomotion  mod indep with LRAD  Communication     Cognition     Pain  <4 with prns  Safety/Judgment  manage safety with mod I assistance   Therapy Plan: PT Intensity: Minimum of 1-2 x/day ,45 to 90 minutes PT Frequency: 5 out of 7 days PT Duration Estimated Length of Stay: 10-12 days OT Intensity: Minimum of 1-2 x/day, 45 to 90 minutes OT Frequency: 5 out of 7 days OT Duration/Estimated Length of Stay: 10-12 days     Team Interventions: Nursing Interventions Patient/Family Education, Bladder Management, Bowel Management, Disease Management/Prevention, Pain Management, Discharge Planning  PT interventions Ambulation/gait training, Discharge planning, DME/adaptive equipment instruction, Functional mobility training, Pain management, Psychosocial support, Therapeutic Activities, UE/LE Strength taining/ROM, Warden/ranger, Community  reintegration, Disease management/prevention, Neuromuscular re-education, Patient/family education, Skin care/wound management, Stair training, Therapeutic Exercise, UE/LE Coordination activities, Wheelchair propulsion/positioning  OT Interventions Warden/ranger, Discharge planning, Self Care/advanced ADL retraining, Pain management, Therapeutic Activities, UE/LE Coordination activities, Therapeutic Exercise, Skin care/wound managment, Patient/family education, Functional mobility training, Community reintegration, Fish farm manager, Psychosocial support, UE/LE Strength taining/ROM  SLP Interventions    TR Interventions    SW/CM Interventions Discharge Planning, Psychosocial Support, Patient/Family Education   Barriers to Discharge MD  Medical stability  Nursing Decreased caregiver support, Home environment access/layout, Neurogenic Bowel & Bladder House  Discharge Home Layout: Two level, 1/2 bath on main level, Bed/bath upstairs  Alternate Level Stairs-Rails: Right  Alternate Level Stairs-Number of Steps: 13  PT None    OT Inaccessible home environment flight of stairs to get to bedroom/bathroom  SLP      SW Insurance for SNF coverage     Team Discharge Planning: Destination: PT-Home ,OT- Home , SLP-  Projected Follow-up: PT-Home health PT, OT-  None, SLP-  Projected Equipment Needs: PT-To be determined, OT- 3 in 1 bedside comode, SLP-  Equipment Details: PT- , OT-  Patient/family involved in discharge planning: PT- Patient, Family member/caregiver,  OT-Patient, Family member/caregiver, SLP-   MD ELOS: 10-12 days Medical Rehab Prognosis:  Excellent Assessment: The patient has been admitted for CIR therapies with the diagnosis of cauda equina syndrome. The team will be addressing functional mobility, strength, stamina, balance, safety, adaptive techniques and equipment, self-care, bowel and bladder mgt, patient and caregiver education, NMR,pain control,  community reentry. Goals have been set at supervision for  self-care and mod I with mobitlity. Anticipated discharge destination is home with family.        See Team Conference Notes for weekly updates to the plan of care

## 2023-09-17 NOTE — Progress Notes (Signed)
 PROGRESS NOTE   Subjective/Complaints:  Pt reports things going well- strength improving.   4 small Bms yesterday. Having control of bowel and bladder- hasn't needed suppository, even though documented?   Pt concerned about LFTs- they are downtrending- I think doing better.    ROS:  Pt denies SOB, abd pain, CP, N/V/C/D, and vision changes Per HPI Objective:   No results found.  Recent Labs    09/15/23 0431 09/16/23 0509  WBC 10.2 10.7*  HGB 14.7 15.7  HCT 41.1 43.3  PLT 170 204   Recent Labs    09/16/23 0509 09/17/23 0436  NA 135 134*  K 3.4* 3.6  CL 96* 94*  CO2 27 28  GLUCOSE 118* 117*  BUN 31* 32*  CREATININE 1.22 1.26*  CALCIUM  9.0 8.8*    Intake/Output Summary (Last 24 hours) at 09/17/2023 1957 Last data filed at 09/17/2023 1238 Gross per 24 hour  Intake 580 ml  Output --  Net 580 ml        Physical Exam: Vital Signs Blood pressure (!) 90/51, pulse 91, temperature 99 F (37.2 C), temperature source Oral, resp. rate 18, height 5' 10 (1.778 m), weight 93.9 kg, SpO2 96%.    General: awake, alert, appropriate, sitting up in bed; NAD HENT: conjugate gaze; oropharynx moist CV: regular rate and rhythm; no JVD Pulmonary: CTA B/L; no W/R/R- good air movement GI: soft, NT, ND, (+)BS Psychiatric: appropriate Neurological: Ox3 MSK: 4+ to 5-/5 in LE's B/L- throughout- more weak in HF and DF B/L Ext: no clubbing, cyanosis, or edema Psych: pleasant and cooperative  Skin: Clean and intact without signs of breakdown. Back incision remains CDI with honeycomb dressing in place.  Neuro:  Alert and oriented x 3. Normal insight and awareness. Intact Memory. Normal language and speech. Cranial nerve exam unremarkable. MMT: BUE 5/5. BLE 4/5 prox to distal. Sensory diminished at 1/2 in L4-S3 dermatomes.Has rectal numbness.  DTR's trace in LE's. Good sitting balance up in chair.   Musculoskeletal: LB tender  with ROM/palpation    Assessment/Plan: 1. Functional deficits which require 3+ hours per day of interdisciplinary therapy in a comprehensive inpatient rehab setting. Physiatrist is providing close team supervision and 24 hour management of active medical problems listed below. Physiatrist and rehab team continue to assess barriers to discharge/monitor patient progress toward functional and medical goals  Care Tool:  Bathing    Body parts bathed by patient: Right arm, Left arm, Chest, Abdomen, Front perineal area, Buttocks, Right upper leg, Left upper leg, Right lower leg, Left lower leg, Face         Bathing assist Assist Level: Supervision/Verbal cueing     Upper Body Dressing/Undressing Upper body dressing   What is the patient wearing?: Pull over shirt, Orthosis Orthosis activity level: Performed by patient  Upper body assist Assist Level: Set up assist    Lower Body Dressing/Undressing Lower body dressing      What is the patient wearing?: Pants     Lower body assist Assist for lower body dressing: Contact Guard/Touching assist     Toileting Toileting    Toileting assist Assist for toileting: Independent     Transfers Chair/bed  transfer  Transfers assist     Chair/bed transfer assist level: Minimal Assistance - Patient > 75%     Locomotion Ambulation   Ambulation assist      Assist level: Minimal Assistance - Patient > 75% Assistive device: Walker-rolling Max distance: 45   Walk 10 feet activity   Assist     Assist level: Contact Guard/Touching assist Assistive device: Walker-rolling   Walk 50 feet activity   Assist Walk 50 feet with 2 turns activity did not occur: Safety/medical concerns (fatigue)         Walk 150 feet activity   Assist Walk 150 feet activity did not occur: Safety/medical concerns (fatigue)         Walk 10 feet on uneven surface  activity   Assist Walk 10 feet on uneven surfaces activity did not occur:  Safety/medical concerns (fatigue)         Wheelchair     Assist Is the patient using a wheelchair?: Yes Type of Wheelchair: Manual    Wheelchair assist level: Total Assistance - Patient < 25%      Wheelchair 50 feet with 2 turns activity    Assist        Assist Level: Total Assistance - Patient < 25%   Wheelchair 150 feet activity     Assist      Assist Level: Total Assistance - Patient < 25%   Blood pressure (!) 90/51, pulse 91, temperature 99 F (37.2 C), temperature source Oral, resp. rate 18, height 5' 10 (1.778 m), weight 93.9 kg, SpO2 96%.  Medical Problem List and Plan: 1. Functional deficits secondary to Cauda Equina syndrome/Lumbar Radiculoapthy s/p  L4-5 decompression and fusion by Dr. Mavis on 09/11/2023              -patient may shower, cover incision please             -ELOS/Goals: 7 days, PT/OT mod I to Sup             Con't CIR PT and OT IPOC done today 2.  Antithrombotics:  -DVT/anticoagulation:  lovenox              -antiplatelet therapy: ASA 81 3. Pain Management: Tylenol  prn. Oxycodone  prn. Continue Robaxin  500 mg BID--change to prn.  4. Mood/Behavior/Sleep: LCSW to follow for evaluation and support.              --Melatonin prn for insomnia.              -antipsychotic agents: N/A 5. Neuropsych/cognition: This patient is capable of making decisions on his own behalf. 6. Skin/Wound Care: Routine pressure relief measures.  7. Fluids/Electrolytes/Nutrition: Monitor I/O. Check CMET in am 8.  HTN: Monitor BP TID--on Zestril  and Toprol  XL. Will set decrease Zestril  to 10 mg and place hold parameters.  Monitor with activity.  --Blood pressures soft -  orthostatic vitals appear unremarkable so far - TEDs ordered. Binder prn.   -acclimate 8/29- BP's still soft, but doing well-  9. CAD/ICM/chronic combined CHF: Monitor weight daily and for signs of overload.             --Heart healthy diet. On Toprol , Zestril , chorthalidone, Crestor . Off  ASA due to surgery             --monitor for symptoms with increase in activity.  8/29- looking better/weight stable on daily weights 10. Abnormal LFTs: ALT-65/AST-90/TB-1.6 @ admission.              --  LFT's continue to climb--in 100's today   -pt on zetia  and crestor  but these are chronic meds   -may be just reactive   -will reach out to pharmacy  -8/28--sl increase again in LFT's. Unclear etiology (116/149)   -will recheck tomorrow   -potassium a little low 3.4, will add supp  8/29- is down trending- will decrease tylenol  dosing and wait to decrease Statin since doing better- will recheck Monday 11. Leukocytosis: WBC up to 10.6 @ admission likely due to steroids. Will recheck in am.             --no  fevers and other signs of infection.   -8/28 10.7, observe only for now 8/29- will recheck Monday unless has Sx's of infection over weekend- monitor clinically  12 Neurogenic bowel: On Miralax  and colace-->had positive results with suppository yesterday --started on bowel program --suppositories cause significant spasms, will stop. He's having sensation, urge to empty, too -continue orals in AM, up to toilet in PM    8/29- had 4 continent Bms yesterday 13. Neurogenic bladder?: On Flomax -->change to evenings to avoid hypotension.  Monitor PVR/Bladder scan  -EOB or toilet to void, timed voids  14. Pre-renal azotemia: BUN-34 @ admission.    -BUN/Cr a little worse today  -push fluids  -hold hygroton   -8/28 labs a little better today, continue to push fluids  8/29- BUN/Cr about the same- Cr up to 1.26- But BUN right around the same-will recheck Monday and follow /push fluids 15.  Pre-diabetes: Hgb A1C- 6.2 (08/02/23)--started on Wegovy  a few months ago.     I spent a total of 36   minutes on total care today- >50% coordination of care- due to  Review of chart- review of new and older labs- and d/w nursing and review of today's therapy notes  LOS: 3 days A FACE TO FACE EVALUATION WAS  PERFORMED  Tamicka Shimon 09/17/2023, 7:57 PM

## 2023-09-17 NOTE — Progress Notes (Signed)
 Occupational Therapy Session Note  Patient Details  Name: Austin Bright MRN: [id number] Date of Birth: 1945/05/06  Today's Date: 09/17/2023 OT Individual Time: 1440-1530 OT Individual Time Calculation (min): 50 min   Short Term Goals: Week 1:  OT Short Term Goal 1 (Week 1): Pt will don LB clothing with (S) OT Short Term Goal 2 (Week 1): Pt will complete stand pivots with CGA OT Short Term Goal 3 (Week 1): Pt will complete toileting tasks with (S)  Skilled Therapeutic Interventions/Progress Updates:  Pt greeted resting in bed, requesting time to complete phone call, skilled OT session with focus on BADL retraining and functional transfers.   Pain: Pt with no reports of pain. OT offering intermediate rest breaks and positioning suggestions throughout session to address pain/fatigue and maximize participation/safety in session.   Functional Transfers: Supine>sit EOB with supervision, sit<>stands with close supervision, ambulatory walk-in shower transfer with CGA + RW.   Self Care Tasks: Pt completes the following self care tasks with levels of assistance noted below, UB: Bathing/dressing with setup/supervision. OT waterproofs incision site. LB: Bathing with use of BSC for posterior reach, introduced long-handled sponge for B distal LE bathing. CGA for standing lowering/hike of garments.  Min cuing provided for back precaution adherence during the above tasks.   Discussed home-shower set-up, pt reports having shower bench and BSC, alongside detachable shower head.   Pt remained resting EOB, RN present in room, 4Ps assessed and immediate needs met. Pt continues to be appropriate for skilled OT intervention to promote further functional independence in ADLs/IADLs.   Therapy Documentation Precautions:  Precautions Precautions: Back, Fall Precaution Booklet Issued: No Recall of Precautions/Restrictions: Intact Precaution/Restrictions Comments: pt able to recall 1/3, educated on 3/3  back precautions Required Braces or Orthoses: Spinal Brace Spinal Brace: Lumbar corset Restrictions Weight Bearing Restrictions Per Provider Order: No   Therapy/Group: Individual Therapy  Nereida Habermann, OTR/L, MSOT  09/17/2023, 6:32 AM

## 2023-09-18 ENCOUNTER — Encounter (HOSPITAL_COMMUNITY): Payer: Self-pay | Admitting: Neurosurgery

## 2023-09-18 DIAGNOSIS — G834 Cauda equina syndrome: Secondary | ICD-10-CM | POA: Diagnosis not present

## 2023-09-18 DIAGNOSIS — E876 Hypokalemia: Secondary | ICD-10-CM

## 2023-09-18 DIAGNOSIS — I10 Essential (primary) hypertension: Secondary | ICD-10-CM | POA: Diagnosis not present

## 2023-09-18 MED ORDER — LISINOPRIL 5 MG PO TABS
5.0000 mg | ORAL_TABLET | Freq: Every day | ORAL | Status: DC
  Administered 2023-09-19 – 2023-09-23 (×5): 5 mg via ORAL
  Filled 2023-09-18 (×5): qty 1

## 2023-09-18 NOTE — Progress Notes (Signed)
 PROGRESS NOTE   Subjective/Complaints:  Pt concerned about BP, normally runs 110-120 systolic now running 90-100 Discussed how spinal injury can affect BP Pt states he has had no dizziness or light headedness  Still with groin numbness but states bowel and bladder function back to normal  ROS:  Pt denies SOB, abd pain, CP, N/V/C/D, and vision changes Per HPI Objective:   No results found.  Recent Labs    09/16/23 0509  WBC 10.7*  HGB 15.7  HCT 43.3  PLT 204   Recent Labs    09/16/23 0509 09/17/23 0436  NA 135 134*  K 3.4* 3.6  CL 96* 94*  CO2 27 28  GLUCOSE 118* 117*  BUN 31* 32*  CREATININE 1.22 1.26*  CALCIUM  9.0 8.8*    Intake/Output Summary (Last 24 hours) at 09/18/2023 1427 Last data filed at 09/18/2023 0747 Gross per 24 hour  Intake 240 ml  Output --  Net 240 ml        Physical Exam: Vital Signs Blood pressure 92/62, pulse 92, temperature 98.7 F (37.1 C), temperature source Oral, resp. rate 20, height 5' 10 (1.778 m), weight 92 kg, SpO2 97%.     General: No acute distress Mood and affect are appropriate Heart: Regular rate and rhythm no rubs murmurs or extra sounds Lungs: Clear to auscultation, breathing unlabored, no rales or wheezes Abdomen: Positive bowel sounds, soft nontender to palpation, nondistended Extremities: No clubbing, cyanosis, or edema Skin: No evidence of breakdown, no evidence of rash  MSK: 4+ to 5-/5 in LE's B/L- throughout- more weak in HF and DF B/L Ext: no clubbing, cyanosis, or edema Psych: pleasant and cooperative  Skin: Clean and intact without signs of breakdown. Back incision remains CDI with honeycomb dressing in place.  Musculoskeletal: LB tender with ROM/palpation    Assessment/Plan: 1. Functional deficits which require 3+ hours per day of interdisciplinary therapy in a comprehensive inpatient rehab setting. Physiatrist is providing close team  supervision and 24 hour management of active medical problems listed below. Physiatrist and rehab team continue to assess barriers to discharge/monitor patient progress toward functional and medical goals  Care Tool:  Bathing    Body parts bathed by patient: Right arm, Left arm, Chest, Abdomen, Front perineal area, Buttocks, Right upper leg, Left upper leg, Right lower leg, Left lower leg, Face         Bathing assist Assist Level: Supervision/Verbal cueing     Upper Body Dressing/Undressing Upper body dressing   What is the patient wearing?: Pull over shirt, Orthosis Orthosis activity level: Performed by patient  Upper body assist Assist Level: Set up assist    Lower Body Dressing/Undressing Lower body dressing      What is the patient wearing?: Pants     Lower body assist Assist for lower body dressing: Contact Guard/Touching assist     Toileting Toileting    Toileting assist Assist for toileting: Independent     Transfers Chair/bed transfer  Transfers assist     Chair/bed transfer assist level: Minimal Assistance - Patient > 75%     Locomotion Ambulation   Ambulation assist      Assist level: Minimal Assistance -  Patient > 75% Assistive device: Walker-rolling Max distance: 45   Walk 10 feet activity   Assist     Assist level: Contact Guard/Touching assist Assistive device: Walker-rolling   Walk 50 feet activity   Assist Walk 50 feet with 2 turns activity did not occur: Safety/medical concerns (fatigue)         Walk 150 feet activity   Assist Walk 150 feet activity did not occur: Safety/medical concerns (fatigue)         Walk 10 feet on uneven surface  activity   Assist Walk 10 feet on uneven surfaces activity did not occur: Safety/medical concerns (fatigue)         Wheelchair     Assist Is the patient using a wheelchair?: Yes Type of Wheelchair: Manual    Wheelchair assist level: Total Assistance - Patient <  25%      Wheelchair 50 feet with 2 turns activity    Assist        Assist Level: Total Assistance - Patient < 25%   Wheelchair 150 feet activity     Assist      Assist Level: Total Assistance - Patient < 25%   Blood pressure 92/62, pulse 92, temperature 98.7 F (37.1 C), temperature source Oral, resp. rate 20, height 5' 10 (1.778 m), weight 92 kg, SpO2 97%.  Medical Problem List and Plan: 1. Functional deficits secondary to Cauda Equina syndrome/Lumbar Radiculoapthy s/p  L4-5 decompression and fusion by Dr. Mavis on 09/11/2023              -patient may shower, cover incision please             -ELOS/Goals: 7 days, PT/OT mod I to Sup             Con't CIR PT and OT IPOC done today 2.  Antithrombotics:  -DVT/anticoagulation:  lovenox              -antiplatelet therapy: ASA 81 3. Pain Management: Tylenol  prn. Oxycodone  prn. Continue Robaxin  500 mg BID--change to prn.  4. Mood/Behavior/Sleep: LCSW to follow for evaluation and support.              --Melatonin prn for insomnia.              -antipsychotic agents: N/A 5. Neuropsych/cognition: This patient is capable of making decisions on his own behalf. 6. Skin/Wound Care: Routine pressure relief measures.  7. Fluids/Electrolytes/Nutrition: Monitor I/O. Check CMET in am 8.  HTN: Monitor BP TID--on Zestril  and Toprol  XL. Will set decrease Zestril  to 10 mg and place hold parameters.  Monitor with activity.  --Blood pressures soft -  orthostatic vitals appear unremarkable so far - TEDs ordered. Binder prn.   -acclimate Vitals:   09/18/23 0516 09/18/23 1318  BP: (!) 126/90 92/62  Pulse: 83 92  Resp: 18 20  Temp: 97.7 F (36.5 C) 98.7 F (37.1 C)  SpO2: 98% 97%  Reduce zestril  to 5mg  8/31  9. CAD/ICM/chronic combined CHF: Monitor weight daily and for signs of overload.             --Heart healthy diet. On Toprol , Zestril , chorthalidone, Crestor . Off ASA due to surgery             --monitor for symptoms with  increase in activity.  8/29- looking better/weight stable on daily weights 10. Abnormal LFTs: ALT-65/AST-90/TB-1.6 @ admission.              --LFT's continue to climb--in 100's today   -  pt on zetia  and crestor  but these are chronic meds   -may be just reactive   -will reach out to pharmacy  -8/28--sl increase again in LFT's. Unclear etiology (116/149)   -will recheck tomorrow   -potassium a little low 3.4, will add supp  8/29- is down trending- will decrease tylenol  dosing and wait to decrease Statin since doing better- will recheck Monday 11. Leukocytosis: WBC up to 10.6 @ admission likely due to steroids. Will recheck in am.             --no  fevers and other signs of infection.   -8/28 10.7, observe only for now 8/29- will recheck Monday unless has Sx's of infection over weekend- monitor clinically  12 Neurogenic bowel: On Miralax  and colace-->had positive results with suppository yesterday --started on bowel program --suppositories cause significant spasms, will stop. He's having sensation, urge to empty, too -continue orals in AM, up to toilet in PM    8/29- had 4 continent Bms yesterday 13. Neurogenic bladder?: On Flomax -->change to evenings to avoid hypotension.  Monitor PVR/Bladder scan  -EOB or toilet to void, timed voids  14. Pre-renal azotemia: BUN-34 @ admission.    -BUN/Cr a little worse today  -push fluids  -hold hygroton   -8/28 labs a little better today, continue to push fluids  8/29- BUN/Cr about the same- Cr up to 1.26- But BUN right around the same-will recheck Monday and follow /push fluids 15.  Pre-diabetes: Hgb A1C- 6.2 (08/02/23)--started on Wegovy  a few months ago.      LOS: 4 days A FACE TO FACE EVALUATION WAS PERFORMED  Prentice FORBES Compton 09/18/2023, 2:27 PM

## 2023-09-19 DIAGNOSIS — G834 Cauda equina syndrome: Secondary | ICD-10-CM | POA: Diagnosis not present

## 2023-09-19 DIAGNOSIS — E876 Hypokalemia: Secondary | ICD-10-CM | POA: Diagnosis not present

## 2023-09-19 DIAGNOSIS — I10 Essential (primary) hypertension: Secondary | ICD-10-CM | POA: Diagnosis not present

## 2023-09-19 MED ORDER — GABAPENTIN 100 MG PO CAPS
100.0000 mg | ORAL_CAPSULE | Freq: Every day | ORAL | Status: DC
  Administered 2023-09-19: 100 mg via ORAL
  Filled 2023-09-19: qty 1

## 2023-09-19 NOTE — Progress Notes (Signed)
 Occupational Therapy Session Note  Patient Details  Name: Austin Bright MRN: [id number] Date of Birth: 05/13/1945  Today's Date: 09/19/2023 OT Individual Time: 8984-8884 OT Individual Time Calculation (min): 60 min    Short Term Goals: Week 1:  OT Short Term Goal 1 (Week 1): Pt will don LB clothing with (S) OT Short Term Goal 2 (Week 1): Pt will complete stand pivots with CGA OT Short Term Goal 3 (Week 1): Pt will complete toileting tasks with (S)  Skilled Therapeutic Interventions/Progress Updates:      Therapy Documentation Precautions:  Precautions Precautions: Back, Fall Precaution Booklet Issued: No Recall of Precautions/Restrictions: Intact Precaution/Restrictions Comments: pt able to recall 1/3, educated on 3/3 back precautions Required Braces or Orthoses: Spinal Brace Spinal Brace: Lumbar corset Restrictions Weight Bearing Restrictions Per Provider Order: No General: Pt seated in W/C upon OT arrival, agreeable to OT.  Pain: no pain reported  Exercises: Pt completed the following exercise circuit in order to improve functional activity, strength and endurance to prepare for ADLs such as bathing. Pt completed the following exercises in seated/standing position with no noted LOB/SOB and varied repetitions on each exercise: -sit to stands with glute resistance 3x5 with green theraband, initially requiring CGA but decreased to SBA d/t increased balance and confidence -standing marches with resistance 3x10 with green theraband -standing resisted hip extension 3x8 with green theraband  OT providing PRN rest breaks for endurance and muscle recovery.    Pt seated in W/C at end of session with W/C alarm donned, call light within reach and 4Ps assessed.    Therapy/Group: Individual Therapy  Camie Hoe, OTD, OTR/L 09/19/2023, 3:59 PM

## 2023-09-19 NOTE — Progress Notes (Signed)
 Physical Therapy Session Note  Patient Details  Name: KOI ZANGARA MRN: [id number] Date of Birth: 1945-09-03  Today's Date: 09/19/2023 PT Individual Time: 0801-0859 PT Individual Time Calculation (min): 58 min   Short Term Goals: Week 1:  PT Short Term Goal 1 (Week 1): Pt will amb up to 150' with RW supervision with decreased scissor pattern PT Short Term Goal 2 (Week 1): Pt will demonstrate sit to stand from low surface ie w/c with min/CGA following proper sequencing PT Short Term Goal 3 (Week 1): Pt will be able to verbalize 3/3 back precautions  Skilled Therapeutic Interventions/Progress Updates:      Pt sitting up in w/c to start - he has no reports of pain, feels that it's being managed well. He's wearing his LSO corset brace during treatment. Discussed Cauda Equina syndrome, reports improvement in B/B functions since surgery.   Sit<>stand to RW from w/c height with supervision assist, cues for hand placement to push from arm rests. He ambulates at supervision level from his room to the main gym, ~141ft, with cues for general safety and pacing. He has no knee buckling or LOB.   Gait training with RW while working on palm support only to reduce reliance of UE support on RW as he ambulates. He reports mild increase in lower back tightness while reducing arm support.   Pt reports a full flight of stairs leading to bed/bath upstairs (13 steps) with 1 hand rail on R leading up. Stair training completed using practice steps (6) using 1 hand rail on R to simulate home. He needed minA for both directions with some soft knee buckling. Some ataxia in LLE while ascending with difficulty having accuracy with foot placement. He navigated x4 + x4 + x4 (seated rest breaks) with seated rest breaks b/w bouts. Educated on benefits of having a 2nd hand rail at home for long term use, both patient and spouse in agreement - they plan on reaching out to their contractor to have this installed.   Pt  finished session on the Nustep with L3 resistance, using BUE/BLE for whole body strengthening and endurance training. 7 minutes completed, 615 steps total, 0.24 miles, 1.8 METS.   Pt returned to his room via w/c 2/2 BLE fatigue. Left sitting up w/ his partner present, needs met.   Therapy Documentation Precautions:  Precautions Precautions: Back, Fall Precaution Booklet Issued: No Recall of Precautions/Restrictions: Intact Precaution/Restrictions Comments: pt able to recall 1/3, educated on 3/3 back precautions Required Braces or Orthoses: Spinal Brace Spinal Brace: Lumbar corset Restrictions Weight Bearing Restrictions Per Provider Order: No General:     Therapy/Group: Individual Therapy  Sherlean SHAUNNA Perks 09/19/2023, 8:17 AM

## 2023-09-19 NOTE — Progress Notes (Signed)
 Occupational Therapy Session Note  Patient Details  Name: Austin Bright MRN: [id number] Date of Birth: 06-15-1945  Today's Date: 09/19/2023 OT Individual Time: 0218-0333 OT Individual Time Calculation (min): 75 min    Short Term Goals: Week 1:  OT Short Term Goal 1 (Week 1): Pt will don LB clothing with (S) OT Short Term Goal 2 (Week 1): Pt will complete stand pivots with CGA OT Short Term Goal 3 (Week 1): Pt will complete toileting tasks with (S)  Skilled Therapeutic Interventions/Progress Updates:    The pt was resting in bed with his family present at the time of arrival.  The pt indicated that he rested ok  during the night with no pain to report at the time of treatment. The pt was able to come from supine in bed to EOB with CGA. The pt was able to complete sit to stand 3x  at [facility] with rest breaks as needed, the pt required 1 rest break.  The pt went on to complete knee lifts 3 x light contact with the RW.  The pt went on to compete UB exercises using a 3lb dumb bell for bicep curls and pull up on a frontal plane ,2 sets of 15 with rest breaks as needed. The pt went on to complete a simulated task in UB/LB dressing  while seated EOB with initial demonstration and vc's for effective carryover 3x's for UB/LB.  The pt required 1 rest break following the exercise.  The pt went on to complete the UB cycle for 10 minutes in duration with 1 rest breaks. The pt was able to ambulate from his room to the gym >200 ft using the RW  and  occasional v/c  for maintaining good proximity in relation to the RW, as well as  adjustments in his posture with 1 rest break. The pt was able to ambulate from the gym  with 1 vc regarding his posture and CGA  for ambulating >160ft before requesting his w/c for transporting back to his room. At the end of the session, the pt remained at w/c LOF with his family present and all additional needs addressed prior to exiting the room.   Therapy Documentation Precautions:   Precautions Precautions: Back, Fall Precaution Booklet Issued: No Recall of Precautions/Restrictions: Intact Precaution/Restrictions Comments: pt able to recall 1/3, educated on 3/3 back precautions Required Braces or Orthoses: Spinal Brace Spinal Brace: Lumbar corset Restrictions Weight Bearing Restrictions Per Provider Order: No Therapy/Group: Individual Therapy  Elvera JONETTA Mace 09/19/2023, 4:02 PM

## 2023-09-19 NOTE — Progress Notes (Signed)
 PROGRESS NOTE   Subjective/Complaints:  Bilateral foot pain at noc, did not have PTA no swelling noted in LEs  Also asking about alpha lipoic acid benefits for neuropathy  ROS:  Pt denies SOB, abd pain, CP, N/V/C/D, and vision changes Per HPI Objective:   No results found.  No results for input(s): WBC, HGB, HCT, PLT in the last 72 hours.  Recent Labs    09/17/23 0436  NA 134*  K 3.6  CL 94*  CO2 28  GLUCOSE 117*  BUN 32*  CREATININE 1.26*  CALCIUM  8.8*    Intake/Output Summary (Last 24 hours) at 09/19/2023 1152 Last data filed at 09/19/2023 0719 Gross per 24 hour  Intake 476 ml  Output --  Net 476 ml        Physical Exam: Vital Signs Blood pressure (!) 102/45, pulse 70, temperature 98.6 F (37 C), temperature source Oral, resp. rate 19, height 5' 10 (1.778 m), weight 92.3 kg, SpO2 99%. General: No acute distress Mood and affect are appropriate Heart: Regular rate and rhythm no rubs murmurs or extra sounds Lungs: Clear to auscultation, breathing unlabored, no rales or wheezes Abdomen: Positive bowel sounds, soft nontender to palpation, nondistended Extremities: No clubbing, cyanosis, or edema Skin: No evidence of breakdown, no evidence of rash  MSK: 4+ to 5-/5 in LE's B/L- throughout- more weak in HF and DF B/L Ext: no clubbing, cyanosis, or edema Psych: pleasant and cooperative  Skin: Clean and intact without signs of breakdown. Back incision remains CDI with honeycomb dressing in place.  Musculoskeletal: no pain with B ankle ROM and palpation   Assessment/Plan: 1. Functional deficits which require 3+ hours per day of interdisciplinary therapy in a comprehensive inpatient rehab setting. Physiatrist is providing close team supervision and 24 hour management of active medical problems listed below. Physiatrist and rehab team continue to assess barriers to discharge/monitor patient progress  toward functional and medical goals  Care Tool:  Bathing    Body parts bathed by patient: Right arm, Left arm, Chest, Abdomen, Front perineal area, Buttocks, Right upper leg, Left upper leg, Right lower leg, Left lower leg, Face         Bathing assist Assist Level: Supervision/Verbal cueing     Upper Body Dressing/Undressing Upper body dressing   What is the patient wearing?: Pull over shirt, Orthosis Orthosis activity level: Performed by patient  Upper body assist Assist Level: Set up assist    Lower Body Dressing/Undressing Lower body dressing      What is the patient wearing?: Pants     Lower body assist Assist for lower body dressing: Contact Guard/Touching assist     Toileting Toileting    Toileting assist Assist for toileting: Independent     Transfers Chair/bed transfer  Transfers assist     Chair/bed transfer assist level: Minimal Assistance - Patient > 75%     Locomotion Ambulation   Ambulation assist      Assist level: Minimal Assistance - Patient > 75% Assistive device: Walker-rolling Max distance: 45   Walk 10 feet activity   Assist     Assist level: Contact Guard/Touching assist Assistive device: Walker-rolling   Walk 50 feet  activity   Assist Walk 50 feet with 2 turns activity did not occur: Safety/medical concerns (fatigue)         Walk 150 feet activity   Assist Walk 150 feet activity did not occur: Safety/medical concerns (fatigue)         Walk 10 feet on uneven surface  activity   Assist Walk 10 feet on uneven surfaces activity did not occur: Safety/medical concerns (fatigue)         Wheelchair     Assist Is the patient using a wheelchair?: Yes Type of Wheelchair: Manual    Wheelchair assist level: Total Assistance - Patient < 25%      Wheelchair 50 feet with 2 turns activity    Assist        Assist Level: Total Assistance - Patient < 25%   Wheelchair 150 feet activity      Assist      Assist Level: Total Assistance - Patient < 25%   Blood pressure (!) 102/45, pulse 70, temperature 98.6 F (37 C), temperature source Oral, resp. rate 19, height 5' 10 (1.778 m), weight 92.3 kg, SpO2 99%.  Medical Problem List and Plan: 1. Functional deficits secondary to Cauda Equina syndrome/Lumbar Radiculoapthy s/p  L4-5 decompression and fusion by Dr. Mavis on 09/11/2023              -patient may shower, cover incision please             -ELOS/Goals: 7 days, PT/OT mod I to Sup             Con't CIR PT and OT IPOC done today 2.  Antithrombotics:  -DVT/anticoagulation:  lovenox              -antiplatelet therapy: ASA 81 3. Pain Management: Tylenol  prn. Oxycodone  prn. Continue Robaxin  500 mg BID--change to prn.  Neurogenic pain add Gabapentin  100mg  qhs 4. Mood/Behavior/Sleep: LCSW to follow for evaluation and support.              --Melatonin prn for insomnia.              -antipsychotic agents: N/A 5. Neuropsych/cognition: This patient is capable of making decisions on his own behalf. 6. Skin/Wound Care: Routine pressure relief measures.  7. Fluids/Electrolytes/Nutrition: Monitor I/O. Check CMET in am 8.  HTN: Monitor BP TID--on Zestril  and Toprol  XL. Will set decrease Zestril  to 10 mg and place hold parameters.  Monitor with activity.  --Blood pressures soft -  orthostatic vitals appear unremarkable so far - TEDs ordered. Binder prn.   -acclimate Vitals:   09/18/23 1902 09/19/23 0354  BP: 117/67 (!) 102/45  Pulse: 87 70  Resp: 18 19  Temp: 98.3 F (36.8 C) 98.6 F (37 C)  SpO2: 98% 99%  Reduce zestril  to 5mg  8/31  9. CAD/ICM/chronic combined CHF: Monitor weight daily and for signs of overload.             --Heart healthy diet. On Toprol , Zestril , chorthalidone, Crestor . Off ASA due to surgery             --monitor for symptoms with increase in activity.  8/29- looking better/weight stable on daily weights 10. Abnormal LFTs: ALT-65/AST-90/TB-1.6 @  admission.              --LFT's continue to climb--in 100's today   -pt on zetia  and crestor  but these are chronic meds   -may be just reactive   -will reach out to pharmacy  -8/28--sl increase  again in LFT's. Unclear etiology (116/149)   -will recheck tomorrow   -potassium a little low 3.4, will add supp  8/29- is down trending- will decrease tylenol  dosing and wait to decrease Statin since doing better- will recheck Monday 11. Leukocytosis: WBC up to 10.6 @ admission likely due to steroids. Will recheck in am.             --no  fevers and other signs of infection.   -8/28 10.7, observe only for now 8/29- will recheck Monday unless has Sx's of infection over weekend- monitor clinically  12 Neurogenic bowel: On Miralax  and colace-->had positive results with suppository yesterday --started on bowel program --suppositories cause significant spasms, will stop. He's having sensation, urge to empty, too -continue orals in AM, up to toilet in PM    8/29- had 4 continent Bms yesterday 13. Neurogenic bladder?: On Flomax -->change to evenings to avoid hypotension.  Monitor PVR/Bladder scan  -EOB or toilet to void, timed voids  14. Pre-renal azotemia: BUN-34 @ admission.    -BUN/Cr a little worse today  -push fluids  -hold hygroton   -8/28 labs a little better today, continue to push fluids  8/29- BUN/Cr about the same- Cr up to 1.26- But BUN right around the same-will recheck Monday and follow /push fluids 15.  Pre-diabetes: Hgb A1C- 6.2 (08/02/23)--started on Wegovy  a few months ago.      LOS: 5 days A FACE TO FACE EVALUATION WAS PERFORMED  Prentice FORBES Compton 09/19/2023, 11:52 AM

## 2023-09-20 DIAGNOSIS — G834 Cauda equina syndrome: Secondary | ICD-10-CM | POA: Diagnosis not present

## 2023-09-20 LAB — COMPREHENSIVE METABOLIC PANEL WITH GFR
ALT: 116 U/L — ABNORMAL HIGH (ref 0–44)
AST: 65 U/L — ABNORMAL HIGH (ref 15–41)
Albumin: 3.1 g/dL — ABNORMAL LOW (ref 3.5–5.0)
Alkaline Phosphatase: 53 U/L (ref 38–126)
Anion gap: 9 (ref 5–15)
BUN: 19 mg/dL (ref 8–23)
CO2: 29 mmol/L (ref 22–32)
Calcium: 8.7 mg/dL — ABNORMAL LOW (ref 8.9–10.3)
Chloride: 98 mmol/L (ref 98–111)
Creatinine, Ser: 1 mg/dL (ref 0.61–1.24)
GFR, Estimated: >60 mL/min (ref >60)
Glucose, Bld: 112 mg/dL — ABNORMAL HIGH (ref 70–99)
Potassium: 3.8 mmol/L (ref 3.5–5.1)
Sodium: 136 mmol/L (ref 135–145)
Total Bilirubin: 0.8 mg/dL (ref 0.0–1.2)
Total Protein: 5.9 g/dL — ABNORMAL LOW (ref 6.5–8.1)

## 2023-09-20 LAB — CBC
HCT: 38.6 % — ABNORMAL LOW (ref 39.0–52.0)
Hemoglobin: 13.7 g/dL (ref 13.0–17.0)
MCH: 32.2 pg (ref 26.0–34.0)
MCHC: 35.5 g/dL (ref 30.0–36.0)
MCV: 90.8 fL (ref 80.0–100.0)
Platelets: 204 K/uL (ref 150–400)
RBC: 4.25 MIL/uL (ref 4.22–5.81)
RDW: 12.8 % (ref 11.5–15.5)
WBC: 8.9 K/uL (ref 4.0–10.5)
nRBC: 0 % (ref 0.0–0.2)

## 2023-09-20 MED ORDER — TRAMADOL HCL 50 MG PO TABS
50.0000 mg | ORAL_TABLET | Freq: Two times a day (BID) | ORAL | Status: DC | PRN
  Administered 2023-09-20: 50 mg via ORAL
  Filled 2023-09-20: qty 1

## 2023-09-20 NOTE — Progress Notes (Signed)
 PROGRESS NOTE   Subjective/Complaints:   Low grade pain-  Only with laying down at night- in feet and ankles.    Aching pain- not burning, tingling/stinging pain. Persistent pain.    Gabapentin  did nothing yesterday for the pain- was given 100 mg at bedtime.   LBM this AM.   ROS:    Pt denies SOB, abd pain, CP, N/V/C/D, and vision changes  Per HPI Objective:   No results found.  Recent Labs    09/20/23 0528  WBC 8.9  HGB 13.7  HCT 38.6*  PLT 204    Recent Labs    09/20/23 0528  NA 136  K 3.8  CL 98  CO2 29  GLUCOSE 112*  BUN 19  CREATININE 1.00  CALCIUM  8.7*    Intake/Output Summary (Last 24 hours) at 09/20/2023 1350 Last data filed at 09/20/2023 1315 Gross per 24 hour  Intake 320 ml  Output --  Net 320 ml        Physical Exam: Vital Signs Blood pressure 134/73, pulse 70, temperature 97.9 F (36.6 C), temperature source Oral, resp. rate 18, height 5' 10 (1.778 m), weight 92.3 kg, SpO2 99%.   General: awake, alert, appropriate, sitting up in bed with wife at bedside;  NAD HENT: conjugate gaze; oropharynx moist CV: regular rate and rhythm; no JVD Pulmonary: CTA B/L; no W/R/R- good air movement GI: soft, NT, ND, (+)BS Psychiatric: appropriate Neurological: Ox3  MSK: 4+ to 5-/5 in LE's B/L- throughout- more weak in HF and DF B/L Ext: no clubbing, cyanosis, or edema Psych: pleasant and cooperative  Skin: Clean and intact without signs of breakdown. Back incision remains CDI with honeycomb dressing in place.  Musculoskeletal: no pain with B ankle ROM and palpation   Assessment/Plan: 1. Functional deficits which require 3+ hours per day of interdisciplinary therapy in a comprehensive inpatient rehab setting. Physiatrist is providing close team supervision and 24 hour management of active medical problems listed below. Physiatrist and rehab team continue to assess barriers to  discharge/monitor patient progress toward functional and medical goals  Care Tool:  Bathing    Body parts bathed by patient: Right arm, Left arm, Chest, Abdomen, Front perineal area, Buttocks, Right upper leg, Left upper leg, Right lower leg, Left lower leg, Face         Bathing assist Assist Level: Supervision/Verbal cueing     Upper Body Dressing/Undressing Upper body dressing   What is the patient wearing?: Pull over shirt, Orthosis Orthosis activity level: Performed by patient  Upper body assist Assist Level: Set up assist    Lower Body Dressing/Undressing Lower body dressing      What is the patient wearing?: Pants     Lower body assist Assist for lower body dressing: Supervision/Verbal cueing     Toileting Toileting    Toileting assist Assist for toileting: Independent     Transfers Chair/bed transfer  Transfers assist     Chair/bed transfer assist level: Minimal Assistance - Patient > 75%     Locomotion Ambulation   Ambulation assist      Assist level: Minimal Assistance - Patient > 75% Assistive device: Walker-rolling Max distance: 45  Walk 10 feet activity   Assist     Assist level: Contact Guard/Touching assist Assistive device: Walker-rolling   Walk 50 feet activity   Assist Walk 50 feet with 2 turns activity did not occur: Safety/medical concerns (fatigue)         Walk 150 feet activity   Assist Walk 150 feet activity did not occur: Safety/medical concerns (fatigue)         Walk 10 feet on uneven surface  activity   Assist Walk 10 feet on uneven surfaces activity did not occur: Safety/medical concerns (fatigue)         Wheelchair     Assist Is the patient using a wheelchair?: Yes Type of Wheelchair: Manual    Wheelchair assist level: Total Assistance - Patient < 25%      Wheelchair 50 feet with 2 turns activity    Assist        Assist Level: Total Assistance - Patient < 25%   Wheelchair  150 feet activity     Assist      Assist Level: Total Assistance - Patient < 25%   Blood pressure 134/73, pulse 70, temperature 97.9 F (36.6 C), temperature source Oral, resp. rate 18, height 5' 10 (1.778 m), weight 92.3 kg, SpO2 99%.  Medical Problem List and Plan: 1. Functional deficits secondary to Cauda Equina syndrome/Lumbar Radiculoapthy s/p  L4-5 decompression and fusion by Dr. Mavis on 09/11/2023              -patient may shower, cover incision please             -ELOS/Goals: 7 days, PT/OT mod I to Sup             Con't CIR PT and OT 2.  Antithrombotics:  -DVT/anticoagulation:  lovenox              -antiplatelet therapy: ASA 81 3. Pain Management: Tylenol  prn. Oxycodone  prn. Continue Robaxin  500 mg BID--change to prn.   -will try Tramadol - 50 mg BID prn for foot/ankle pain 4. Mood/Behavior/Sleep: LCSW to follow for evaluation and support.              --Melatonin prn for insomnia.              -antipsychotic agents: N/A 5. Neuropsych/cognition: This patient is capable of making decisions on his own behalf. 6. Skin/Wound Care: Routine pressure relief measures.  7. Fluids/Electrolytes/Nutrition: Monitor I/O. Check CMET in am 8.  HTN: Monitor BP TID--on Zestril  and Toprol  XL. Will set decrease Zestril  to 10 mg and place hold parameters.  Monitor with activity.  --Blood pressures soft -  orthostatic vitals appear unremarkable so far - TEDs ordered. Binder prn.   -acclimate Vitals:   09/19/23 1955 09/20/23 0442  BP: 121/70 134/73  Pulse: 81 70  Resp: 18 18  Temp: 98.6 F (37 C) 97.9 F (36.6 C)  SpO2: 98% 99%  Reduce zestril  to 5mg  8/31 9/1- BP doing better- con't regimen 9. CAD/ICM/chronic combined CHF: Monitor weight daily and for signs of overload.             --Heart healthy diet. On Toprol , Zestril , chorthalidone, Crestor . Off ASA due to surgery             --monitor for symptoms with increase in activity.  8/29- looking better/weight stable on daily  weights  9/1- Weights stable- con't regimen 10. Abnormal LFTs: ALT-65/AST-90/TB-1.6 @ admission.              --  LFT's continue to climb--in 100's today   -pt on zetia  and crestor  but these are chronic meds   -may be just reactive   -will reach out to pharmacy  -8/28--sl increase again in LFT's. Unclear etiology (116/149)   -will recheck tomorrow   -potassium a little low 3.4, will add supp  8/29- is down trending- will decrease tylenol  dosing and wait to decrease Statin since doing better- will recheck Monday  9/1- LFT's 65/116- still trending down- is better, so still will wait ot reduce statin due to his heavy cardiac hx.  11. Leukocytosis: WBC up to 10.6 @ admission likely due to steroids. Will recheck in am.             --no  fevers and other signs of infection.   -8/28 10.7, observe only for now 8/29- will recheck Monday unless has Sx's of infection over weekend- monitor clinically  12 Neurogenic bowel: On Miralax  and colace-->had positive results with suppository yesterday --started on bowel program --suppositories cause significant spasms, will stop. He's having sensation, urge to empty, too -continue orals in AM, up to toilet in PM    8/29- had 4 continent Bms yesterday 9/1- LBM this AM 13. Neurogenic bladder?: On Flomax -->change to evenings to avoid hypotension.  Monitor PVR/Bladder scan  -EOB or toilet to void, timed voids  14. Pre-renal azotemia: BUN-34 @ admission.    -BUN/Cr a little worse today  -push fluids  -hold hygroton   -8/28 labs a little better today, continue to push fluids  8/29- BUN/Cr about the same- Cr up to 1.26- But BUN right around the same-will recheck Monday and follow /push fluids  9/1- BUN 19- doing much better 15.  Pre-diabetes: Hgb A1C- 6.2 (08/02/23)--started on Wegovy  a few months ago.     I spent a total of 36   minutes on total care today- >50% coordination of care- due to  D/w pt, wife as well as spoke with team- and also reviewed labs and  vitals  LOS: 6 days A FACE TO FACE EVALUATION WAS PERFORMED  Austin Bright 09/20/2023, 1:50 PM

## 2023-09-20 NOTE — Plan of Care (Signed)
  Problem: RH Balance Goal: LTG Patient will maintain dynamic standing with ADLs (OT) Description: LTG:  Patient will maintain dynamic standing balance with assist during activities of daily living (OT)  Flowsheets (Taken 09/20/2023 1519) LTG: Pt will maintain dynamic standing balance during ADLs with: (upgraded JLS) Independent with assistive device   Problem: Sit to Stand Goal: LTG:  Patient will perform sit to stand in prep for activites of daily living with assistance level (OT) Description: LTG:  Patient will perform sit to stand in prep for activites of daily living with assistance level (OT) Flowsheets (Taken 09/20/2023 1519) LTG: PT will perform sit to stand in prep for activites of daily living with assistance level: (upgraded JLS) Independent with assistive device   Problem: RH Bathing Goal: LTG Patient will bathe all body parts with assist levels (OT) Description: LTG: Patient will bathe all body parts with assist levels (OT) Flowsheets (Taken 09/20/2023 1519) LTG: Pt will perform bathing with assistance level/cueing: (upgraded JLS) Independent with assistive device    Problem: RH Dressing Goal: LTG Patient will perform lower body dressing w/assist (OT) Description: LTG: Patient will perform lower body dressing with assist, with/without cues in positioning using equipment (OT) Flowsheets (Taken 09/20/2023 1519) LTG: Pt will perform lower body dressing with assistance level of: (upgraded JLS) Independent with assistive device   Problem: RH Toileting Goal: LTG Patient will perform toileting task (3/3 steps) with assistance level (OT) Description: LTG: Patient will perform toileting task (3/3 steps) with assistance level (OT)  Flowsheets (Taken 09/20/2023 1519) LTG: Pt will perform toileting task (3/3 steps) with assistance level: (upgraded JLS) Independent with assistive device   Problem: RH Toilet Transfers Goal: LTG Patient will perform toilet transfers w/assist (OT) Description:  LTG: Patient will perform toilet transfers with assist, with/without cues using equipment (OT) Flowsheets (Taken 09/20/2023 1519) LTG: Pt will perform toilet transfers with assistance level of: (upgraded JLS) Independent with assistive device

## 2023-09-20 NOTE — Progress Notes (Signed)
 Occupational Therapy Session Note  Patient Details  Name: Austin Bright MRN: [id number] Date of Birth: August 14, 1945  Today's Date: 09/20/2023 OT Individual Time: 9299-9184 OT Individual Time Calculation (min): 75 min    Short Term Goals: Week 1:  OT Short Term Goal 1 (Week 1): Pt will don LB clothing with (S) OT Short Term Goal 2 (Week 1): Pt will complete stand pivots with CGA OT Short Term Goal 3 (Week 1): Pt will complete toileting tasks with (S)  Skilled Therapeutic Interventions/Progress Updates:    Skilled OT intervention with focus on bathing/dressing, functional amb with RW, standing balance, BLE strengthening, safety awareness, and activity tolerance to increase independence with BADLs. Initial focus on bathing at shower level-supervision seated on BSC. Pt amb back into room and completed dressing with sit<>stand from w/c. Pt able to don shoes (slip on) without assistance. Pt stood at sink to brush teeth. Amb to day room. NuStep hils (level2-7) for 10 minn; 84 SPM avg. Pt amb with RW to gym. Block practice sit<>stand with UE crossed on chest 3x10 with rest breaks. Pt returned to room and remained in w/c with all needs withn reach. Wife present.  Therapy Documentation Precautions:  Precautions Precautions: Back, Fall Precaution Booklet Issued: No Recall of Precautions/Restrictions: Intact Precaution/Restrictions Comments: pt able to recall 1/3, educated on 3/3 back precautions Required Braces or Orthoses: Spinal Brace Spinal Brace: Lumbar corset Restrictions Weight Bearing Restrictions Per Provider Order: No Pain:  Pt c/o R/L foot pain (aching); LPN aware, shower     Therapy/Group: Individual Therapy  [name] 09/20/2023, 8:19 AM

## 2023-09-20 NOTE — Progress Notes (Signed)
 Physical Therapy Session Note  Patient Details  Name: Austin Bright MRN: [id number] Date of Birth: [birth year]  Today's Date: 09/20/2023 PT Individual Time: 1115-1200 PT Individual Time Calculation (min): 45 min   Short Term Goals: Week 1:  PT Short Term Goal 1 (Week 1): Pt will amb up to 150' with RW supervision with decreased scissor pattern PT Short Term Goal 2 (Week 1): Pt will demonstrate sit to stand from low surface ie w/c with min/CGA following proper sequencing PT Short Term Goal 3 (Week 1): Pt will be able to verbalize 3/3 back precautions  Skilled Therapeutic Interventions/Progress Updates:   Pt received upright in WC, agreeable to therapy and reports no pain, just increased fatigue in BLE. Pt wheeled down to main gym for energy conservation.   Interventions focused on core strength for dynamic mobility, improved coordination, and functional activity tolerance: Pt performed modified bird dogs in quadruped, alternating UE first, then alternating LE. BLE extension very difficult, pt given yoga ball to support core for final sets.  Pt then performed seated pallof press w/ verbal and visual cues, red TB progressing to blue TB, progressing to standing pallof w/ yellow TB. Pt cued to bend knees and given red TB around knees to activate glute med and decrease strain on low back. Pt limited in repetitions due to low back pain. Finally, pt performed sit to stand transfers from mat table w/ LLE elevated on step, focused on NMR and improving RLE strength.  Pt ambulated ~173ft w/ RW to improve cardiovascular endurance and required CGA to SBA for safety. Pt demonstrated slight R trendelenburg gait, signifying weak L glute med. Pt returned to [facility] w/ all needs in place including call bell.  Of note, pt and pt wife requesting Wednesday for DC.  Therapy Documentation Precautions:  Precautions Precautions: Back, Fall Precaution Booklet Issued: No Recall of Precautions/Restrictions:  Intact Precaution/Restrictions Comments: pt able to recall 1/3, educated on 3/3 back precautions Required Braces or Orthoses: Spinal Brace Spinal Brace: Lumbar corset Restrictions Weight Bearing Restrictions Per Provider Order: No   Therapy/Group: Individual Therapy  Oneil Grumbles 09/20/2023, 12:37 PM

## 2023-09-20 NOTE — Progress Notes (Signed)
 Occupational Therapy Session Note  Patient Details  Name: Austin Bright MRN: [id number] Date of Birth: 1945/03/08  Today's Date: 09/20/2023 OT Individual Time: 0930-1030 OT Individual Time Calculation (min): 60 min    Short Term Goals: Week 1:  OT Short Term Goal 1 (Week 1): Pt will don LB clothing with (S) OT Short Term Goal 2 (Week 1): Pt will complete stand pivots with CGA OT Short Term Goal 3 (Week 1): Pt will complete toileting tasks with (S) Week 2:     Skilled Therapeutic Interventions/Progress Updates:    1:1 Pt received in the w/c. Pt's wife present for therapy. Discussed and problem solving how many times pt is planning to go up and down the staircase daily. Also discussed energy conservation and planning his day accordingly. Pt climbed 4 steps and then descended with bilateral rails taking alternating steps. Further discussion about routine at home and energy consevation. Pt then climbed 11 stairs in the stairwell alternating stepping. Then rested at the top of the stairwell in a chair and then descended with both feet on each step. Pt able to climb and decend with min guard. Performed hip extension with slight resistance in standing bilaterally, laterally stepping over block working on hip strengthening, sit to stands holding ball in between LEs, sit <> supine, donning brace, straight leg raises in supine bilaterally and isolated sides addressing strengthen in core and LEs. Pt required rest breaks with activities. Pt left resting sitting up in w/c with ice.

## 2023-09-21 ENCOUNTER — Encounter (HOSPITAL_COMMUNITY): Payer: Self-pay | Admitting: Physical Medicine and Rehabilitation

## 2023-09-21 DIAGNOSIS — G834 Cauda equina syndrome: Secondary | ICD-10-CM | POA: Diagnosis not present

## 2023-09-21 MED ORDER — METHOCARBAMOL 750 MG PO TABS
750.0000 mg | ORAL_TABLET | Freq: Four times a day (QID) | ORAL | Status: DC | PRN
  Administered 2023-09-21: 750 mg via ORAL
  Filled 2023-09-21 (×2): qty 1

## 2023-09-21 MED ORDER — OXYCODONE HCL 5 MG PO TABS
5.0000 mg | ORAL_TABLET | ORAL | Status: DC | PRN
  Administered 2023-09-21 – 2023-09-22 (×2): 5 mg via ORAL
  Administered 2023-09-23: 10 mg via ORAL
  Filled 2023-09-21: qty 2
  Filled 2023-09-21: qty 1
  Filled 2023-09-21: qty 2

## 2023-09-21 NOTE — Progress Notes (Signed)
 Occupational Therapy Session Note  Patient Details  Name: Austin Bright MRN: [id number] Date of Birth: Apr 14, 1945  Today's Date: 09/21/2023 OT Individual Time: 8599-8571 OT Individual Time Calculation (min): 28 min    Short Term Goals: Week 1:  OT Short Term Goal 1 (Week 1): Pt will don LB clothing with (S) OT Short Term Goal 2 (Week 1): Pt will complete stand pivots with CGA OT Short Term Goal 3 (Week 1): Pt will complete toileting tasks with (S)  Skilled Therapeutic Interventions/Progress Updates:    Skilled OT intervention with focus on functional amb in community envirionment, accessing public restroom, discharge planning, and safety awareness to increase independence with BADLS. Pt required assistance managing door to public restroom but navigated around restroom with supervision. Amb in courtyard, navigating uneven pavement, joint expansions, and various slopes with supervision. Amb in gift shop throuhout aisles with supervision. No LOB or safety concerns noted. Pt returned to room and remaine din w/c with all needs within reach.  Therapy Documentation Precautions:  Precautions Precautions: Back, Fall Precaution Booklet Issued: No Recall of Precautions/Restrictions: Intact Precaution/Restrictions Comments: pt able to recall 1/3, educated on 3/3 back precautions Required Braces or Orthoses: Spinal Brace Spinal Brace: Lumbar corset Restrictions Weight Bearing Restrictions Per Provider Order: No Pain:  Pt denies pain this afternoon   Therapy/Group: Individual Therapy  [name] 09/21/2023, 2:50 PM

## 2023-09-21 NOTE — Plan of Care (Signed)
  Problem: Consults Goal: RH SPINAL CORD INJURY PATIENT EDUCATION Description:  See Patient Education module for education specifics.  Outcome: Progressing   Problem: SCI BOWEL ELIMINATION Goal: RH STG MANAGE BOWEL WITH ASSISTANCE Description: STG Manage Bowel with  mod I Assistance. Outcome: Progressing   Problem: SCI BLADDER ELIMINATION Goal: RH STG MANAGE BLADDER WITH ASSISTANCE Description: STG Manage Bladder With  mod I Assistance Outcome: Progressing   Problem: RH SKIN INTEGRITY Goal: RH STG SKIN FREE OF INFECTION/BREAKDOWN Description: Manage skin free of infection with mod I assistance Outcome: Progressing   Problem: RH SAFETY Goal: RH STG ADHERE TO SAFETY PRECAUTIONS W/ASSISTANCE/DEVICE Description: STG Adhere to Safety Precautions With  mod I Assistance/Device. Outcome: Progressing   Problem: RH PAIN MANAGEMENT Goal: RH STG PAIN MANAGED AT OR BELOW PT'S PAIN GOAL Description: < 4 w/ prns Outcome: Progressing   Problem: RH KNOWLEDGE DEFICIT SCI Goal: RH STG INCREASE KNOWLEDGE OF SELF CARE AFTER SCI Description: Manage increase  knowledge of self care after SCI with mod I assistance from spouse using educational materials provided  Outcome: Progressing

## 2023-09-21 NOTE — Progress Notes (Signed)
 Physical Therapy Session Note  Patient Details  Name: Austin Bright MRN: [id number] Date of Birth: 07/24/1945  Today's Date: 09/21/2023 PT Individual Time: 0900-1000 PT Individual Time Calculation (min): 60 min   Short Term Goals: Week 1:  PT Short Term Goal 1 (Week 1): Pt will amb up to 150' with RW supervision with decreased scissor pattern PT Short Term Goal 2 (Week 1): Pt will demonstrate sit to stand from low surface ie w/c with min/CGA following proper sequencing PT Short Term Goal 3 (Week 1): Pt will be able to verbalize 3/3 back precautions  Skilled Therapeutic Interventions/Progress Updates:   Pt received upright in WC, agreeable to therapy. Pt reported L hamstring pain that had been resolved w/ OT. Pain came back on during session and was treated w/ physical movement. Pt reported L hamstring pain flared up during initial gait, decreased w/ glute max activation exercises.   Transfers: Pt performed stand to sit, sit to stand transfers w/ SBA, cued to not use RW in sit to stand as pt reaches for it out of habit.   Interventions: Pt performed 65ft w/ RW to improve gait mechanics and functional activity tolerance and supervision for safety. Pt performed ~89ft of gait w/ RUE support on hallway rail, CGA for safety, demonstrating severely diminished B glute max and glute med activation, trunk flexion compensation in L stance phase for diminished R hip extension.  Pt performed standing hip extension exercises, demonstrating trunk flexion compensation bilaterally for weakened hip extensor muscles. Pt then stood in [place] to emphasize glute max activation from perched height, given supervision assist for safety. Used green TB around waist, resistance increased as pt progressed through hip extension. 4x10 reps.  Pt ascendend/descended 11 steps w/ B handrail support to strengthen BLE and improve cardiovascular endurance w/ alternating step pattern asc/desc, with asc being tougher than  desc per pt report. Pt given seated rest break at top of stairs before descending stairs back to WC.  Pt then ambulated back to room w/ RW and SBA due to fatigue, returned to [facility] and was given call bell and all other needs in reach.   Therapy Documentation Precautions:  Precautions Precautions: Back, Fall Precaution Booklet Issued: No Recall of Precautions/Restrictions: Intact Precaution/Restrictions Comments: pt able to recall 1/3, educated on 3/3 back precautions Required Braces or Orthoses: Spinal Brace Spinal Brace: Lumbar corset Restrictions Weight Bearing Restrictions Per Provider Order: No   Therapy/Group: Individual Therapy  Oneil Grumbles 09/21/2023, 11:52 AM

## 2023-09-21 NOTE — Progress Notes (Signed)
 Occupational Therapy Session Note  Patient Details  Name: Austin Bright MRN: [id number] Date of Birth: 09-20-1945  Today's Date: 09/21/2023 OT Individual Time: 1135-1200 OT Individual Time Calculation (min): 25 min    Short Term Goals: Week 1:  OT Short Term Goal 1 (Week 1): Pt will don LB clothing with (S) OT Short Term Goal 2 (Week 1): Pt will complete stand pivots with CGA OT Short Term Goal 3 (Week 1): Pt will complete toileting tasks with (S)  Skilled Therapeutic Interventions/Progress Updates:    Skilled OT intervention with focus on sit<>stand, functional amb with RW, standing balance, and toileting to increase independence with BADLs. Amb with RW to day room for standing activity with cornhole board. Pt completed tossing bean bags standing on AirEx and cleaning bean bags while standing on AirEx. CGA for standing on compliant surface. Pt amb with RW back to room with supervision. Pt requested to use toilet. Toileting with supervision. Standing at sink to wash hands with supervision. Pt returned to w/c. All needs within reach and wife present.   Therapy Documentation Precautions:  Precautions Precautions: Back, Fall Precaution Booklet Issued: No Recall of Precautions/Restrictions: Intact Precaution/Restrictions Comments: pt able to recall 1/3, educated on 3/3 back precautions Required Braces or Orthoses: Spinal Brace Spinal Brace: Lumbar corset Restrictions Weight Bearing Restrictions Per Provider Order: No   Pain: Pt denies pain this morning   Therapy/Group: Individual Therapy  [name] 09/21/2023, 12:11 PM

## 2023-09-21 NOTE — Plan of Care (Signed)
  Problem: SCI BOWEL ELIMINATION Goal: RH STG MANAGE BOWEL WITH ASSISTANCE Description: STG Manage Bowel with  mod I Assistance. Outcome: Progressing   Problem: SCI BLADDER ELIMINATION Goal: RH STG MANAGE BLADDER WITH ASSISTANCE Description: STG Manage Bladder With  mod I Assistance Outcome: Progressing   Problem: RH SKIN INTEGRITY Goal: RH STG SKIN FREE OF INFECTION/BREAKDOWN Description: Manage skin free of infection with mod I assistance Outcome: Progressing   Problem: RH SAFETY Goal: RH STG ADHERE TO SAFETY PRECAUTIONS W/ASSISTANCE/DEVICE Description: STG Adhere to Safety Precautions With  mod I Assistance/Device. Outcome: Progressing   Problem: RH PAIN MANAGEMENT Goal: RH STG PAIN MANAGED AT OR BELOW PT'S PAIN GOAL Description: < 4 w/ prns Outcome: Progressing   Problem: RH KNOWLEDGE DEFICIT SCI Goal: RH STG INCREASE KNOWLEDGE OF SELF CARE AFTER SCI Description: Manage increase  knowledge of self care after SCI with mod I assistance from spouse using educational materials provided  Outcome: Progressing

## 2023-09-21 NOTE — Progress Notes (Signed)
 Occupational Therapy Session Note  Patient Details  Name: Austin Bright MRN: [id number] Date of Birth: Aug 03, 1945  Today's Date: 09/21/2023 OT Individual Time: 0700-0810 OT Individual Time Calculation (min): 70 min    Short Term Goals: Week 1:  OT Short Term Goal 1 (Week 1): Pt will don LB clothing with (S) OT Short Term Goal 2 (Week 1): Pt will complete stand pivots with CGA OT Short Term Goal 3 (Week 1): Pt will complete toileting tasks with (S)  Skilled Therapeutic Interventions/Progress Updates:    Skilled OT intervention with standing balance activities using Wii and Wii balance board. Wii balance board activities with penguin/iceberg and titl table. Pt utilized appropirate balance strategies. Wii bowling with staggered stance. Rest break x 1. Pt removed UE from RW for all activties. CGA for balance board and supervision for bowling. No LOB noted. Pt returned to room and remained in w/c. All needs within reach. Wife present.   Therapy Documentation Precautions:  Precautions Precautions: Back, Fall Precaution Booklet Issued: No Recall of Precautions/Restrictions: Intact Precaution/Restrictions Comments: pt able to recall 1/3, educated on 3/3 back precautions Required Braces or Orthoses: Spinal Brace Spinal Brace: Lumbar corset Restrictions Weight Bearing Restrictions Per Provider Order: No Pain:  Pt reports he thinks he pulled a hamstring on previous day; bothersome during night but better this morning; stretching and activity with relief noted   Exercises:   Other Treatments:     Therapy/Group: Individual Therapy  [name] 09/21/2023, 8:11 AM

## 2023-09-21 NOTE — Progress Notes (Signed)
 PROGRESS NOTE   Subjective/Complaints:  Tramadol  didn't help at all- was given robaxin - helped more than expected?  But maybe not enough- woken up from sleep at 11pm due to pain- went to sleep 9:30pm- and went back to sleep 1:45am- couldn't get comfortable during that time due to constant aching pain.   Also hamstring was bothering him.   Of note, also received Trazodone  and  10 mg Melatonin and didn't really help much.   Kpad was helpful for pain  Bowels working ok ROS:    Pt denies SOB, abd pain, CP, N/V/C/D, and vision changes   Per HPI Objective:   No results found.  Recent Labs    09/20/23 0528  WBC 8.9  HGB 13.7  HCT 38.6*  PLT 204    Recent Labs    09/20/23 0528  NA 136  K 3.8  CL 98  CO2 29  GLUCOSE 112*  BUN 19  CREATININE 1.00  CALCIUM  8.7*    Intake/Output Summary (Last 24 hours) at 09/21/2023 1019 Last data filed at 09/21/2023 0835 Gross per 24 hour  Intake 240 ml  Output --  Net 240 ml        Physical Exam: Vital Signs Blood pressure 110/60, pulse 68, temperature 97.9 F (36.6 C), resp. rate 20, height 5' 10 (1.778 m), weight 93.4 kg, SpO2 96%.    General: awake, alert, appropriate, sitting up in bedside w/c; wife at bedside NAD HENT: conjugate gaze; oropharynx moist CV: regular rate and rhythm; no JVD Pulmonary: CTA B/L; no W/R/R- good air movement GI: soft, NT, ND, (+)BS- normoactive Psychiatric: appropriate Neurological: Ox3  MSK: 4+ to 5-/5 in LE's B/L- throughout- more weak in HF and DF B/L Ext: no clubbing, cyanosis, or edema Psych: pleasant and cooperative  Skin: Clean and intact without signs of breakdown. Back incision remains CDI with honeycomb dressing in place.  Musculoskeletal: no pain with B ankle ROM and palpation   Assessment/Plan: 1. Functional deficits which require 3+ hours per day of interdisciplinary therapy in a comprehensive inpatient rehab  setting. Physiatrist is providing close team supervision and 24 hour management of active medical problems listed below. Physiatrist and rehab team continue to assess barriers to discharge/monitor patient progress toward functional and medical goals  Care Tool:  Bathing    Body parts bathed by patient: Right arm, Left arm, Chest, Abdomen, Front perineal area, Buttocks, Right upper leg, Left upper leg, Right lower leg, Left lower leg, Face         Bathing assist Assist Level: Supervision/Verbal cueing     Upper Body Dressing/Undressing Upper body dressing   What is the patient wearing?: Pull over shirt, Orthosis Orthosis activity level: Performed by patient  Upper body assist Assist Level: Set up assist    Lower Body Dressing/Undressing Lower body dressing      What is the patient wearing?: Pants     Lower body assist Assist for lower body dressing: Supervision/Verbal cueing     Toileting Toileting    Toileting assist Assist for toileting: Independent     Transfers Chair/bed transfer  Transfers assist     Chair/bed transfer assist level: Minimal Assistance - Patient >  75%     Locomotion Ambulation   Ambulation assist      Assist level: Minimal Assistance - Patient > 75% Assistive device: Walker-rolling Max distance: 45   Walk 10 feet activity   Assist     Assist level: Contact Guard/Touching assist Assistive device: Walker-rolling   Walk 50 feet activity   Assist Walk 50 feet with 2 turns activity did not occur: Safety/medical concerns (fatigue)         Walk 150 feet activity   Assist Walk 150 feet activity did not occur: Safety/medical concerns (fatigue)         Walk 10 feet on uneven surface  activity   Assist Walk 10 feet on uneven surfaces activity did not occur: Safety/medical concerns (fatigue)         Wheelchair     Assist Is the patient using a wheelchair?: Yes Type of Wheelchair: Manual    Wheelchair  assist level: Total Assistance - Patient < 25%      Wheelchair 50 feet with 2 turns activity    Assist        Assist Level: Total Assistance - Patient < 25%   Wheelchair 150 feet activity     Assist      Assist Level: Total Assistance - Patient < 25%   Blood pressure 110/60, pulse 68, temperature 97.9 F (36.6 C), resp. rate 20, height 5' 10 (1.778 m), weight 93.4 kg, SpO2 96%.  Medical Problem List and Plan: 1. Functional deficits secondary to Cauda Equina syndrome/Lumbar Radiculoapthy s/p  L4-5 decompression and fusion by Dr. Mavis on 09/11/2023              -patient may shower, cover incision please             -ELOS/Goals: 7 days, PT/OT mod I to Sup             Con't CIR PT and OT Team conference today to determine length of stay 2.  Antithrombotics:  -DVT/anticoagulation:  lovenox              -antiplatelet therapy: ASA 81 3. Pain Management: Tylenol  prn. Oxycodone  prn. Continue Robaxin  500 mg BID--change to prn.   9/2- given robaxin - will change to 750mg  q6 hours prn- and suggested to take after dinner to kick in along with use kpad.  4. Mood/Behavior/Sleep: LCSW to follow for evaluation and support.              --Melatonin prn for insomnia.              -antipsychotic agents: N/A 5. Neuropsych/cognition: This patient is capable of making decisions on his own behalf. 6. Skin/Wound Care: Routine pressure relief measures.  7. Fluids/Electrolytes/Nutrition: Monitor I/O. Check CMET in am 8.  HTN: Monitor BP TID--on Zestril  and Toprol  XL. Will set decrease Zestril  to 10 mg and place hold parameters.  Monitor with activity.  --Blood pressures soft -  orthostatic vitals appear unremarkable so far - TEDs ordered. Binder prn.   -acclimate Vitals:   09/20/23 2048 09/21/23 0448  BP: 114/71 110/60  Pulse: 81 68  Resp:  20  Temp:  97.9 F (36.6 C)  SpO2:  96%  Reduce zestril  to 5mg  8/31 9/1-9/2- BP doing better- con't regimen 9. CAD/ICM/chronic combined CHF:  Monitor weight daily and for signs of overload.             --Heart healthy diet. On Toprol , Zestril , chorthalidone, Crestor . Off ASA due to surgery             --  monitor for symptoms with increase in activity.  8/29- looking better/weight stable on daily weights  9/1- Weights stable- con't regimen 10. Abnormal LFTs: ALT-65/AST-90/TB-1.6 @ admission.              --LFT's continue to climb--in 100's today   -pt on zetia  and crestor  but these are chronic meds   -may be just reactive   -will reach out to pharmacy  -8/28--sl increase again in LFT's. Unclear etiology (116/149)   -will recheck tomorrow   -potassium a little low 3.4, will add supp  8/29- is down trending- will decrease tylenol  dosing and wait to decrease Statin since doing better- will recheck Monday  9/1- LFT's 65/116- still trending down- is better, so still will wait ot reduce statin due to his heavy cardiac hx.  11. Leukocytosis: WBC up to 10.6 @ admission likely due to steroids. Will recheck in am.             --no  fevers and other signs of infection.   -8/28 10.7, observe only for now 8/29- will recheck Monday unless has Sx's of infection over weekend- monitor clinically  12 Neurogenic bowel: On Miralax  and colace-->had positive results with suppository yesterday --started on bowel program --suppositories cause significant spasms, will stop. He's having sensation, urge to empty, too -continue orals in AM, up to toilet in PM    8/29- had 4 continent Bms yesterday 9/1- LBM this AM 9/2- going more regularly 13. Neurogenic bladder?: On Flomax -->change to evenings to avoid hypotension.  Monitor PVR/Bladder scan  -EOB or toilet to void, timed voids  14. Pre-renal azotemia: BUN-34 @ admission.    -BUN/Cr a little worse today  -push fluids  -hold hygroton   -8/28 labs a little better today, continue to push fluids  8/29- BUN/Cr about the same- Cr up to 1.26- But BUN right around the same-will recheck Monday and follow /push  fluids  9/1- BUN 19- doing much better 15.  Pre-diabetes: Hgb A1C- 6.2 (08/02/23)--started on Wegovy  a few months ago.     I spent a total of 39   minutes on total care today- >50% coordination of care- due to  D/w pt about pain- changed robaxin  and kpad was added- and team conference to determine LOS  LOS: 7 days A FACE TO FACE EVALUATION WAS PERFORMED  Austin Bright 09/21/2023, 10:19 AM

## 2023-09-21 NOTE — Progress Notes (Signed)
 Physical Therapy Discharge Summary  Patient Details  Name: [name] MRN: [id number] Date of Birth: 1945-09-21  Date of Discharge from PT service:September 23, 2023  {CHL IP REHAB PT TIME CALCULATION:304800500}   Patient has met {NUMBERS 0-12:18577} of {NUMBERS 0-12:18577} long term goals due to improved activity tolerance, improved balance, improved postural control, increased strength, ability to compensate for deficits, improved awareness, and improved coordination.  Patient to discharge at [facility] level {LOA:3049010}.   Patient's care partner is independent to provide the necessary {assistance:3041652} assistance at discharge.  Reasons goals not met: ***  Recommendation:  Patient will benefit from ongoing skilled PT services in {setting:3041680} to continue to advance safe functional mobility, address ongoing impairments in ***, and minimize fall risk.  Equipment: {equipment:3041657}  Reasons for discharge: {Reason for discharge:3049018}  Patient/family agrees with progress made and goals achieved: {Pt/Family agree with progress/goals:3049020}  PT Discharge Precautions/Restrictions Precautions Precautions: Back;Fall Precaution Booklet Issued: No Recall of Precautions/Restrictions: Intact Required Braces or Orthoses: Spinal Brace Spinal Brace: Lumbar corset;Applied in sitting position Restrictions Weight Bearing Restrictions Per Provider Order: No Vital Signs Therapy Vitals Temp: 99.2 F (37.3 C) Temp Source: Oral Pulse Rate: 81 Resp: 16 BP: 115/68 Patient Position (if appropriate): Lying Oxygen Therapy SpO2: 97 % O2 Device: Room Air Pain   Pain Interference   Vision/Perception     Cognition   Sensation   Motor     Mobility   Locomotion  Pick up small object from the floor assist level: Supervision/Verbal cueing Pick up small object from the floor assistive device: reacher  Trunk/Postural Assessment     Balance   Extremity  Assessment            Austin Bright 09/21/2023, 4:27 PM

## 2023-09-21 NOTE — Progress Notes (Signed)
 Nurse called reporting Austin Bright is having pain. With no relief with his current medication regimen. Dr Cornelio note was reviewed, Oxycodone  ordered was not on MAR, Oxycodone  resumed as previously ordered. Nurse instructed to give Oxycodone  5 mg and to reassess after a few hours, she verbalizes understanding ,

## 2023-09-21 NOTE — Patient Care Conference (Signed)
 Inpatient RehabilitationTeam Conference and Plan of Care Update Date: 09/21/2023   Time: 1129 am  Patient was admitted after the interdisciplinary team meeting took place on 09/14/23; as a result, the first team conference occurred on day 8 of this patient's stay.     Patient Name: Austin Bright      Medical Record Number: [id number]  Date of Birth: [birth year] Sex: Male         Room/Bed: 4W18C/4W18C-01 Payor Info: Payor: [employer] MEDICARE / Plan: [employer] MEDICARE CHOICE PPO / Product Type: *No Product type* /    Admit Date/Time:  09/14/2023  4:46 PM  Primary Diagnosis:  Cauda equina compression [facility])  Hospital Problems: Principal Problem:   Cauda equina compression [facility])    Expected Discharge Date: Expected Discharge Date: 09/23/23  Team Members Present: Physician leading conference: Dr. Duwaine Barrs Social Worker Present: Graeme Jude, LCSW Nurse Present: Eulalio Falls, RN PT Present: Schuyler Batter, PT OT Present: Delon Sharps, OT;Charlena Cha, COTA PPS Coordinator present : Eleanor Colon, SLP     Current Status/Progress Goal Weekly Team Focus  Bowel/Bladder      Neurogenic bowels Neurogenic bladder- timed toileting    Support patient's independence in bowel program/ bladder program    Assess bowel and bladder q shift  Swallow/Nutrition/ Hydration               ADL's   bating/dressing and toleting-supervision; BLE fatigue quickly; standing balance-supervision   mod I overall   education, endurance, BLE strengtheing, discharge planning    Mobility   CGA to SBA for all OOB mobility   mod I for all mobility  gait training, stair training, dynamic standing balance.    Communication                Safety/Cognition/ Behavioral Observations               Pain      Hamstring/ foot/ ankle pains    <4 w/ prns    Assess pain q shift  Skin       Surgical incision- adhesive strips; prefers tegaderm   Skin free of infection entire stay on rehab     Assess skin q shift    Discharge Planning:  Pt will d/c to home with wife who intends to take two weeks off at discharge. Support from their dtr as well. SW will confirm there are no barriers to discharge.    Team Discussion: Patient was admitted post s/p  L4-5 decompression and fusion due to Cauda Equina syndrome/Lumbar Radiculopathy. Patient with pain: medications adjusted by MD. Progress limited by leg fatigue/weakness.  Patient on target to meet rehab goals: Currently patient needs supervision with ADLs and CGA-SBA for all OOB mobility. Overall goals at discharge are set for mod I assistance.   *See Care Plan and progress notes for long and short-term goals.   Revisions to Treatment Plan:  Kpad Timed toileting Lumbar brace Abdominal binder Teds  Teaching Needs: Safety, medications, toileting, transfers, etc   Current Barriers to Discharge: Decreased caregiver support, Home enviroment access/layout, and Neurogenic bowel and bladder  Possible Resolutions to Barriers: Family Education Outpatient follow up DME: RW     Medical Summary Current Status: growing pains pain- in feet and ankles- didn't respond to tramadol  or gabapentin -  timed voiding- surgical incision- ok;  Barriers to Discharge: Neurogenic Bowel & Bladder;Self-care education;Weight bearing restrictions  Barriers to Discharge Comments: limited by leg weakness, fatigue easily in LEs- difficult to cotnrol night pain  only-- wife to take car eof pt Possible Resolutions to [employer] Focus: will d/c THursday 9/4- and added/increased robaxin  to 750 mg QID prn- and doesn't want opiates   Continued Need for Acute Rehabilitation Level of Care: The patient requires daily medical management by a physician with specialized training in physical medicine and rehabilitation for the following reasons: Direction of a multidisciplinary physical rehabilitation program to maximize functional independence : Yes Medical  management of patient stability for increased activity during participation in an intensive rehabilitation regime.: Yes Analysis of laboratory values and/or radiology reports with any subsequent need for medication adjustment and/or medical intervention. : Yes   I attest that I was present, lead the team conference, and concur with the assessment and plan of the team.   Amadu Schlageter Gayo 09/21/2023, 1129 am

## 2023-09-21 NOTE — Progress Notes (Signed)
 Patient ID: Austin Bright, male   DOB: 05/04/1945, 78 y.o.   MRN: [id number]   1533- SW spoke with pt wife Austin Bright to provide updates from team conference, d/c date 9/4, and d/c recs- outpatient PT and DME- RW. SW will order RW and outpatient referral will be sent to [facility].   SW ordered RW with [employer] via parachute.   *SW met with pt in room to inform on above.   Graeme Jude, MSW, LCSW Office: [phone] Cell: [phone] Fax: [phone]

## 2023-09-22 ENCOUNTER — Other Ambulatory Visit (HOSPITAL_COMMUNITY): Payer: Self-pay

## 2023-09-22 ENCOUNTER — Encounter: Payer: Self-pay | Admitting: Internal Medicine

## 2023-09-22 DIAGNOSIS — G834 Cauda equina syndrome: Secondary | ICD-10-CM | POA: Diagnosis not present

## 2023-09-22 MED ORDER — ASCORBIC ACID 1000 MG PO TABS
1000.0000 mg | ORAL_TABLET | Freq: Every day | ORAL | Status: AC
Start: 2023-09-22 — End: ?

## 2023-09-22 MED ORDER — TAMSULOSIN HCL 0.4 MG PO CAPS
0.4000 mg | ORAL_CAPSULE | Freq: Every day | ORAL | 0 refills | Status: DC
  Filled 2023-09-22: qty 30, 30d supply, fill #0

## 2023-09-22 MED ORDER — METHOCARBAMOL 750 MG PO TABS
750.0000 mg | ORAL_TABLET | Freq: Four times a day (QID) | ORAL | 0 refills | Status: DC
  Filled 2023-09-22: qty 120, 30d supply, fill #0

## 2023-09-22 MED ORDER — LISINOPRIL 5 MG PO TABS
5.0000 mg | ORAL_TABLET | Freq: Every day | ORAL | 0 refills | Status: DC
  Filled 2023-09-22: qty 30, 30d supply, fill #0

## 2023-09-22 MED ORDER — OXYCODONE-ACETAMINOPHEN 5-325 MG PO TABS
1.0000 | ORAL_TABLET | Freq: Three times a day (TID) | ORAL | 0 refills | Status: DC | PRN
  Filled 2023-09-22: qty 21, 7d supply, fill #0

## 2023-09-22 MED ORDER — OXYCODONE-ACETAMINOPHEN 5-325 MG PO TABS: 1.0000 | ORAL_TABLET | Freq: Three times a day (TID) | ORAL | Status: DC | PRN

## 2023-09-22 MED ORDER — POLYETHYLENE GLYCOL 3350 17 G PO PACK: 17.0000 g | PACK | Freq: Every day | ORAL | 0 refills | Status: DC

## 2023-09-22 MED ORDER — ACETAMINOPHEN 325 MG PO TABS: 650.0000 mg | ORAL_TABLET | ORAL | Status: DC | PRN

## 2023-09-22 NOTE — Progress Notes (Signed)
 PROGRESS NOTE   Subjective/Complaints:  Pt reports needed percocet 5/325 mg last night x1 and was finally able to go to sleep after that- didn't ask until 2am-  Robaxin  taken at 7pm- pain didn't start til 9:30pm, likely due to robaxin  being used.   Would like Rx for percocet just in case ROS:    Pt denies SOB, abd pain, CP, N/V/C/D, and vision changes    Per HPI Objective:   No results found.  Recent Labs    09/20/23 0528  WBC 8.9  HGB 13.7  HCT 38.6*  PLT 204    Recent Labs    09/20/23 0528  NA 136  K 3.8  CL 98  CO2 29  GLUCOSE 112*  BUN 19  CREATININE 1.00  CALCIUM  8.7*    Intake/Output Summary (Last 24 hours) at 09/22/2023 0815 Last data filed at 09/22/2023 0800 Gross per 24 hour  Intake 460 ml  Output --  Net 460 ml        Physical Exam: Vital Signs Blood pressure 128/77, pulse 70, temperature 97.9 F (36.6 C), resp. rate 18, height 5' 10 (1.778 m), weight 93.4 kg, SpO2 100%.     General: awake, alert, appropriate, OT in room; wife in room; sitting up in w/c;  NAD HENT: conjugate gaze; oropharynx moist CV: regular rate and rhythm; no JVD Pulmonary: CTA B/L; no W/R/R- good air movement GI: soft, NT, ND, (+)BS Psychiatric: appropriate Neurological: Ox3  MSK: 4+ to 5-/5 in LE's B/L- throughout- more weak in HF and DF B/L Ext: no clubbing, cyanosis, or edema Psych: pleasant and cooperative  Skin: Clean and intact without signs of breakdown. Back incision remains CDI with honeycomb dressing in place.  Musculoskeletal: no pain with B ankle ROM and palpation   Assessment/Plan: 1. Functional deficits which require 3+ hours per day of interdisciplinary therapy in a comprehensive inpatient rehab setting. Physiatrist is providing close team supervision and 24 hour management of active medical problems listed below. Physiatrist and rehab team continue to assess barriers to discharge/monitor  patient progress toward functional and medical goals  Care Tool:  Bathing    Body parts bathed by patient: Right arm, Left arm, Chest, Abdomen, Front perineal area, Buttocks, Right upper leg, Left upper leg, Right lower leg, Left lower leg, Face         Bathing assist Assist Level: Independent with assistive device     Upper Body Dressing/Undressing Upper body dressing   What is the patient wearing?: Pull over shirt, Orthosis Orthosis activity level: Performed by patient  Upper body assist Assist Level: Independent    Lower Body Dressing/Undressing Lower body dressing      What is the patient wearing?: Pants     Lower body assist Assist for lower body dressing: Independent with assitive device     Toileting Toileting    Toileting assist Assist for toileting: Independent with assistive device     Transfers Chair/bed transfer  Transfers assist     Chair/bed transfer assist level: Supervision/Verbal cueing     Locomotion Ambulation   Ambulation assist      Assist level: Supervision/Verbal cueing Assistive device: Walker-rolling Max distance: 200  Walk 10 feet activity   Assist     Assist level: Supervision/Verbal cueing Assistive device: Walker-rolling   Walk 50 feet activity   Assist Walk 50 feet with 2 turns activity did not occur: Safety/medical concerns (fatigue)  Assist level: Supervision/Verbal cueing Assistive device: Walker-rolling    Walk 150 feet activity   Assist Walk 150 feet activity did not occur: Safety/medical concerns (fatigue)  Assist level: Supervision/Verbal cueing Assistive device: Walker-rolling    Walk 10 feet on uneven surface  activity   Assist Walk 10 feet on uneven surfaces activity did not occur: Safety/medical concerns (fatigue)   Assist level: Contact Guard/Touching assist Assistive device: Walker-rolling   Wheelchair     Assist Is the patient using a wheelchair?: Yes Type of Wheelchair:  Manual    Wheelchair assist level: Supervision/Verbal cueing Max wheelchair distance: 150    Wheelchair 50 feet with 2 turns activity    Assist        Assist Level: Supervision/Verbal cueing   Wheelchair 150 feet activity     Assist      Assist Level: Supervision/Verbal cueing   Blood pressure 128/77, pulse 70, temperature 97.9 F (36.6 C), resp. rate 18, height 5' 10 (1.778 m), weight 93.4 kg, SpO2 100%.  Medical Problem List and Plan: 1. Functional deficits secondary to Cauda Equina syndrome/Lumbar Radiculoapthy s/p  L4-5 decompression and fusion by Dr. Mavis on 09/11/2023              -patient may shower, cover incision please             -ELOS/Goals: 7 days, PT/OT mod I to Sup        D/c tomorrow Con't CIR 2.  Antithrombotics:  -DVT/anticoagulation:  lovenox              -antiplatelet therapy: ASA 81 3. Pain Management: Tylenol  prn. Oxycodone  prn. Continue Robaxin  500 mg BID--change to prn.   9/2- given robaxin - will change to 750mg  q6 hours prn- and suggested to take after dinner to kick in along with use kpad.   9/3- required percocet last night around 2am for pain- waited from 9:30 to 2am til ask for something stronger- advised to take when it starts to prevent escalation of pain.  4. Mood/Behavior/Sleep: LCSW to follow for evaluation and support.              --Melatonin prn for insomnia.              -antipsychotic agents: N/A 5. Neuropsych/cognition: This patient is capable of making decisions on his own behalf. 6. Skin/Wound Care: Routine pressure relief measures.  7. Fluids/Electrolytes/Nutrition: Monitor I/O. Check CMET in am 8.  HTN: Monitor BP TID--on Zestril  and Toprol  XL. Will set decrease Zestril  to 10 mg and place hold parameters.  Monitor with activity.  --Blood pressures soft -  orthostatic vitals appear unremarkable so far - TEDs ordered. Binder prn.   -acclimate Vitals:   09/21/23 2023 09/22/23 0415  BP: 116/78 128/77  Pulse: 78 70   Resp:  18  Temp:  97.9 F (36.6 C)  SpO2:  100%  Reduce zestril  to 5mg  8/31 9/1-9/3- BP doing better- con't regimen 9. CAD/ICM/chronic combined CHF: Monitor weight daily and for signs of overload.             --Heart healthy diet. On Toprol , Zestril , chorthalidone, Crestor . Off ASA due to surgery             --monitor for symptoms with increase in  activity.  8/29- looking better/weight stable on daily weights  9/1- Weights stable- con't regimen  9/3- no weight seen, but no signs of swelling 10. Abnormal LFTs: ALT-65/AST-90/TB-1.6 @ admission.              --LFT's continue to climb--in 100's today   -pt on zetia  and crestor  but these are chronic meds   -may be just reactive   -will reach out to pharmacy  -8/28--sl increase again in LFT's. Unclear etiology (116/149)   -will recheck tomorrow   -potassium a little low 3.4, will add supp  8/29- is down trending- will decrease tylenol  dosing and wait to decrease Statin since doing better- will recheck Monday  9/1- LFT's 65/116- still trending down- is better, so still will wait ot reduce statin due to his heavy cardiac hx.  11. Leukocytosis: WBC up to 10.6 @ admission likely due to steroids. Will recheck in am.             --no  fevers and other signs of infection.   -8/28 10.7, observe only for now 8/29- will recheck Monday unless has Sx's of infection over weekend- monitor clinically  9/3- WBC down to 8.9k 12 Neurogenic bowel: On Miralax  and colace-->had positive results with suppository yesterday --started on bowel program --suppositories cause significant spasms, will stop. He's having sensation, urge to empty, too -continue orals in AM, up to toilet in PM    8/29- had 4 continent Bms yesterday 9/1- LBM this AM 9/2- going more regularly 13. Neurogenic bladder?: On Flomax -->change to evenings to avoid hypotension.  Monitor PVR/Bladder scan  -EOB or toilet to void, timed voids  14. Pre-renal azotemia: BUN-34 @ admission.    -BUN/Cr  a little worse today  -push fluids  -hold hygroton   -8/28 labs a little better today, continue to push fluids  8/29- BUN/Cr about the same- Cr up to 1.26- But BUN right around the same-will recheck Monday and follow /push fluids  9/1- BUN 19- doing much better 15.  Pre-diabetes: Hgb A1C- 6.2 (08/02/23)--started on Wegovy  a few months ago.    I spent a total of 35   minutes on total care today- >50% coordination of care- due to  D/w pt and OT about pt's function and skin- also about pain  LOS: 8 days A FACE TO FACE EVALUATION WAS PERFORMED  Kendale Rembold 09/22/2023, 8:15 AM

## 2023-09-22 NOTE — Progress Notes (Signed)
 Physical Therapy Session Note  Patient Details  Name: Austin Bright MRN: [id number] Date of Birth: 06/19/1945  Today's Date: 09/22/2023 PT Individual Time: 0900-1000 PT Individual Time Calculation (min): 60 min   Short Term Goals: Week 1:  PT Short Term Goal 1 (Week 1): Pt will amb up to 150' with RW supervision with decreased scissor pattern PT Short Term Goal 2 (Week 1): Pt will demonstrate sit to stand from low surface ie w/c with min/CGA following proper sequencing PT Short Term Goal 3 (Week 1): Pt will be able to verbalize 3/3 back precautions  Skilled Therapeutic Interventions/Progress Updates: Pt presented in w/c agreeable to therapy. Pt denies pain at rest however noted increased hamstring pain intermittent throughout session. Continued education regarding energy conservation particularly upon d/c. Pt stood with supervision and ambulated to day room with CGA fading to close supervision. Pt initiated HEP at mat as noted below. Pt required intermittent tactile cues for improved positioning to minimize dorsal extension with standing activities and to allow increased glute max/med recruitment. Pt completed all transfers with supervision and completed sit to/from supine at mat with supervision but required a verbal cue to maintain spinal precautions. PTA also discussed creation of walking program and provided handout. Pt ambulated at end of session >32ft with supervision throughout unit. Pt returned to room and returned to w/c at end of session with wife present and current needs met.   Access Code: [email or web address] URL: [email or web address] Date: 09/22/2023 Prepared by: Margeret Brayla Pat  Exercises - Hooklying Single Knee to Chest Stretch  - 1 x daily - 7 x weekly - 3 sets - 10 reps - Supine Bridge  - 1 x daily - 7 x weekly - 3 sets - 10 reps - Supine Bridge with Resistance Band  - 1 x daily - 7 x weekly - 3 sets - 10 reps - Standing Hip extension with angle  - 1 x daily - 7 x  weekly - 3 sets - 10 reps - Sit to stand from elevated chair  - 1 x daily - 7 x weekly - 3 sets - 10 reps   Tx2: Pt presented in w/c agreeable to therapy. Pt endorses mild L hamstring pain, no intervention requested as needed. Pt propelled to ortho gym with supervision for general conditioning. Pt completed stand step transfer to mat with supervision and RW. Participated in balance activities including retesting of Berg Balance Assessment. Pt demonstrated an improvement in the [place] with a score of 40/56 however continues to demonstrate an increased fall risk as noted by score of  40/56 on Berg Balance Scale.  (<36= high risk for falls, close to 100%; 37-45 significant >80%; 46-51 moderate >50%; 52-55 lower >25%). Continued education provided throughout session on energy conservation techniques as well as instruction/review of HEP. Pt noted to have increased fatigue throughout session requiring increased rest breaks between activities. Pt was able to provide teachback of HEP with cues and after extended seated rest was able to ambulate back to room with distant supervision. Pt made mod I in room and nsg notified. Pt left in room at end of session with wife present and current needs met.       Therapy Documentation Precautions:  Precautions Precautions: Back, Fall Precaution Booklet Issued: No Recall of Precautions/Restrictions: Intact Precaution/Restrictions Comments: pt able to recall 1/3, educated on 3/3 back precautions Required Braces or Orthoses: Spinal Brace Spinal Brace: Lumbar corset, Applied in sitting position Restrictions Weight Bearing Restrictions Per Provider Order: No General:  Vital Signs: Therapy Vitals Temp: 98.7 F (37.1 C) Temp Source: Oral Pulse Rate: 79 Resp: 20 BP: 101/61 Patient Position (if appropriate): Sitting Oxygen Therapy SpO2: 99 % O2 Device: Room Air Pain:   Mobility: Bed Mobility Bed Mobility: Rolling Left;Left Sidelying to Sit;Supine to  Sit Rolling Left: Independent with assistive device Left Sidelying to Sit: Independent with assistive device Supine to Sit: Independent with assistive device Transfers Transfers: Sit to Stand;Stand to Sit;Stand Pivot Transfers Sit to Stand: Independent with assistive device Stand to Sit: Independent with assistive device Stand Pivot Transfers: Independent with assistive device Transfer (Assistive device): Rolling walker Locomotion : Gait Ambulation: Yes Gait Assistance: Supervision/Verbal cueing Gait Distance (Feet): 150 Feet Assistive device: Rolling walker Gait Assistance Details: improved gait mechanics, decreased scissoring, decreased use of BUE on RW Gait Gait: Yes Gait Pattern: Within Functional Limits Gait velocity: decreased Stairs / Additional Locomotion Stairs: Yes Stairs Assistance: Contact Guard/Touching assist Stair Management Technique: Two rails Number of Stairs: 12 Height of Stairs: 6 Ramp: Supervision/Verbal cueing Wheelchair Mobility Wheelchair Mobility: Yes Wheelchair Assistance: Set up Education officer, museum: Both upper extremities Wheelchair Parts Management: Needs assistance Distance: >187ft  Trunk/Postural Assessment : Cervical Assessment Cervical Assessment: Within Functional Limits Thoracic Assessment Thoracic Assessment: Exceptions to WFL (LSO) Lumbar Assessment Lumbar Assessment: Exceptions to WFL (LSO) Postural Control Postural Control: Deficits on evaluation Protective Responses: delayed  Balance: Berg Balance Test Sit to Stand: Able to stand without using hands and stabilize independently Standing Unsupported: Able to stand safely 2 minutes Sitting with Back Unsupported but Feet Supported on Floor or Stool: Able to sit safely and securely 2 minutes Stand to Sit: Sits safely with minimal use of hands Transfers: Able to transfer safely, definite need of hands Standing Unsupported with Eyes Closed: Able to stand 10 seconds  safely Standing Ubsupported with Feet Together: Able to place feet together independently and stand 1 minute safely From Standing, Reach Forward with Outstretched Arm: Can reach confidently >25 cm (10) From Standing Position, Pick up Object from Floor: Able to pick up shoe safely and easily From Standing Position, Turn to Look Behind Over each Shoulder: Turn sideways only but maintains balance Turn 360 Degrees: Able to turn 360 degrees safely but slowly Standing Unsupported, Alternately Place Feet on Step/Stool: Needs assistance to keep from falling or unable to try Standing Unsupported, One Foot in Front: Needs help to step but can hold 15 seconds Standing on One Leg: Unable to try or needs assist to prevent fall Total Score: 40 Static Sitting Balance Static Sitting - Balance Support: Feet supported Static Sitting - Level of Assistance: 7: Independent Dynamic Sitting Balance Dynamic Sitting - Balance Support: During functional activity Dynamic Sitting - Level of Assistance: 6: Modified independent (Device/Increase time) Static Standing Balance Static Standing - Balance Support: Bilateral upper extremity supported Static Standing - Level of Assistance: 6: Modified independent (Device/Increase time) Dynamic Standing Balance Dynamic Standing - Balance Support: Bilateral upper extremity supported Dynamic Standing - Level of Assistance: 5: Stand by assistance Exercises:   Other Treatments:      Therapy/Group: Individual Therapy  Sophia Sperry 09/22/2023, 3:17 PM

## 2023-09-22 NOTE — Progress Notes (Signed)
 Occupational Therapy Discharge Summary  Patient Details  Name: Austin Bright MRN: [id number] Date of Birth: August 24, 1945  Date of Discharge from OT service:September 22, 2023  Patient has met 7 of 7 long term goals due to improved activity tolerance, improved balance, postural control, ability to compensate for deficits, and improved coordination.  Pt made excellent progress with BADLs and functional transfers during this admission. Pt is mod I for bathing/dressing. Pt dons/doffs  LSO independently. Pts wife has been present during therapy session. Pt and wife verbalized understanding of all recommendations. Patient to discharge at overall supervision to mod I  level.  Patient's care partner is independent to provide the necessary physical assistance at discharge.    Reasons goals not met: n/a  Recommendation:  No f/u recommended at this time.  Equipment: No equipment provided  Reasons for discharge: treatment goals met and discharge from hospital  Patient/family agrees with progress made and goals achieved: Yes  OT Discharge ADL ADL Equipment Provided: Long-handled sponge Eating: Independent Where Assessed-Eating: Wheelchair Grooming: Independent Where Assessed-Grooming: Standing at sink Upper Body Bathing: Modified independent Where Assessed-Upper Body Bathing: Shower Lower Body Bathing: Modified independent Where Assessed-Lower Body Bathing: Shower Upper Body Dressing: Independent Where Assessed-Upper Body Dressing: Wheelchair Lower Body Dressing: Modified independent Where Assessed-Lower Body Dressing: Standing at sink, Sitting at sink, Wheelchair Toileting: Modified independent Where Assessed-Toileting: Neurosurgeon Method: Proofreader: Grab bars, Raised toilet seat Film/video editor: Modified independent Film/video editor Method: Designer, industrial/product: Information systems manager  without back Vision Baseline Vision/History: 1 Wears glasses Patient Visual Report: No change from baseline Vision Assessment?: No apparent visual deficits Perception  Perception: Within Functional Limits Praxis Praxis: WFL Cognition Cognition Overall Cognitive Status: Within Functional Limits for tasks assessed Arousal/Alertness: Awake/alert Orientation Level: Person;Situation;Place Person: Oriented Place: Oriented Situation: Oriented Memory: Appears intact Attention: Selective Focused Attention: Appears intact Selective Attention: Appears intact Awareness: Appears intact Problem Solving: Appears intact Safety/Judgment: Appears intact Brief Interview for Mental Status (BIMS) Repetition of Three Words (First Attempt): 3 Temporal Orientation: Year: Correct Temporal Orientation: Month: Accurate within 5 days Temporal Orientation: Day: Correct Recall: Sock: Yes, no cue required Recall: Blue: Yes, no cue required Recall: Bed: Yes, no cue required BIMS Summary Score: 15 Sensation Sensation Light Touch: Impaired Detail Central sensation comments: Pt reports improving daily- overall intact from he reports abnormal sensation in bottom of feet especially and on inside of calf Light Touch Impaired Details: Impaired LLE;Impaired RLE Hot/Cold: Appears Intact Proprioception: Appears Intact Stereognosis: Appears Intact Coordination Gross Motor Movements are Fluid and Coordinated: Yes Fine Motor Movements are Fluid and Coordinated: Yes Coordination and Movement Description: BLE weakness and sensation changes Motor  Motor Motor: Abnormal postural alignment and control Mobility  Bed Mobility Bed Mobility: Rolling Left;Left Sidelying to Sit;Supine to Sit Rolling Left: Independent with assistive device Left Sidelying to Sit: Independent with assistive device Supine to Sit: Independent with assistive device Transfers Sit to Stand: Independent with assistive device Stand to  Sit: Independent with assistive device  Trunk/Postural Assessment  Cervical Assessment Cervical Assessment: Within Functional Limits Thoracic Assessment Thoracic Assessment: Exceptions to WFL (LSO) Lumbar Assessment Lumbar Assessment: Exceptions to WFL (LSO) Postural Control Protective Responses: delayed  Balance Static Sitting Balance Static Sitting - Level of Assistance: 7: Independent Dynamic Sitting Balance Dynamic Sitting - Balance Support: During functional activity Dynamic Sitting - Level of Assistance: 6: Modified independent (Device/Increase time) Extremity/Trunk Assessment RUE Assessment RUE Assessment: Exceptions to [facility] General  Strength Comments: Hx of rotator cuff tear but WNL AROM LUE Assessment LUE Assessment: Within Functional Limits   Austin Bright 09/22/2023, 6:49 AM

## 2023-09-22 NOTE — Progress Notes (Signed)
 Patient ID: Austin Bright, male   DOB: Jan 15, 1946, 78 y.o.   MRN: [id number]  SW faxed outpatient PT order to [facility].   Graeme Jude, MSW, LCSW Office: [phone] Cell: [phone] Fax: [phone]

## 2023-09-22 NOTE — Progress Notes (Signed)
 Occupational Therapy Session Note  Patient Details  Name: Austin Bright MRN: [id number] Date of Birth: 01-10-1946  Today's Date: 09/22/2023 OT Individual Time: 0700-0810 OT Individual Time Calculation (min): 70 min    Short Term Goals: Week 1:  OT Short Term Goal 1 (Week 1): Pt will don LB clothing with (S) OT Short Term Goal 2 (Week 1): Pt will complete stand pivots with CGA OT Short Term Goal 3 (Week 1): Pt will complete toileting tasks with (S)  Skilled Therapeutic Interventions/Progress Updates:    Skilled OT intervention with focus on BADLs, functional amb with RW, furniture transfers, bed transfers, home safety recommendations/educaiton, and activity tolerance to increase independence with BADLs. All amb with RW at mod I. Bathing/dressing with mod A. Pt stood at sink to complete grooming. Pt practiced sit<>stand from various heights and furniture. Pt paracticed bed transfers on std bed in ADL apt and elevated mat to simulate home envirionment. Reviewed home safety recommendations. Pt returned to room and reamined in w/c with all needs within reach. Wife present.   Therapy Documentation Precautions:  Precautions Precautions: Back, Fall Precaution Booklet Issued: No Recall of Precautions/Restrictions: Intact Precaution/Restrictions Comments: pt able to recall 1/3, educated on 3/3 back precautions Required Braces or Orthoses: Spinal Brace Spinal Brace: Lumbar corset, Applied in sitting position Restrictions Weight Bearing Restrictions Per Provider Order: No Pain: Pt reports increased pain in BLE during night but better this morning ADL: ADL Equipment Provided: Long-handled sponge Eating: Independent Where Assessed-Eating: Wheelchair Grooming: Independent Where Assessed-Grooming: Standing at sink Upper Body Bathing: Modified independent Where Assessed-Upper Body Bathing: Shower Lower Body Bathing: Modified independent Where Assessed-Lower Body Bathing: Shower Upper Body  Dressing: Independent Where Assessed-Upper Body Dressing: Wheelchair Lower Body Dressing: Modified independent Where Assessed-Lower Body Dressing: Standing at sink, Sitting at sink, Wheelchair Toileting: Modified independent Where Assessed-Toileting: Teacher, adult education: Engineer, agricultural Method: Proofreader: Grab bars, Raised toilet seat Film/video editor: Modified independent Film/video editor Method: Designer, industrial/product: Shower seat without back   Therapy/Group: Individual Therapy  [name] 09/22/2023, 8:13 AM

## 2023-09-22 NOTE — Plan of Care (Signed)
  Problem: Consults Goal: RH SPINAL CORD INJURY PATIENT EDUCATION Description:  See Patient Education module for education specifics.  Outcome: Progressing   Problem: SCI BOWEL ELIMINATION Goal: RH STG MANAGE BOWEL WITH ASSISTANCE Description: STG Manage Bowel with  mod I Assistance. Outcome: Progressing   Problem: SCI BLADDER ELIMINATION Goal: RH STG MANAGE BLADDER WITH ASSISTANCE Description: STG Manage Bladder With  mod I Assistance Outcome: Progressing   Problem: RH SKIN INTEGRITY Goal: RH STG SKIN FREE OF INFECTION/BREAKDOWN Description: Manage skin free of infection with mod I assistance Outcome: Progressing   Problem: RH SAFETY Goal: RH STG ADHERE TO SAFETY PRECAUTIONS W/ASSISTANCE/DEVICE Description: STG Adhere to Safety Precautions With  mod I Assistance/Device. Outcome: Progressing   Problem: RH PAIN MANAGEMENT Goal: RH STG PAIN MANAGED AT OR BELOW PT'S PAIN GOAL Description: < 4 w/ prns Outcome: Progressing   Problem: RH KNOWLEDGE DEFICIT SCI Goal: RH STG INCREASE KNOWLEDGE OF SELF CARE AFTER SCI Description: Manage increase  knowledge of self care after SCI with mod I assistance from spouse using educational materials provided  Outcome: Progressing

## 2023-09-22 NOTE — Discharge Summary (Signed)
 Physician Discharge Summary  Patient ID: Austin Bright MRN: [id number] DOB/AGE: [birth year] 78 y.o.  Admit date: 09/14/2023 Discharge date: 09/23/2023  Discharge Diagnoses:  Principal Problem:   Cauda equina compression [facility]) Active Problems:   Essential hypertension   Abnormal LFTs  Hypokalemia  Pre renal azotemia  Constipation.   Discharged Condition: stable  Significant Diagnostic Studies: VAS US  LOWER EXTREMITY VENOUS (DVT) Result Date: 09/15/2023  Lower Venous DVT Study Patient Name:  NASON CONRADT II  Date of Exam:   09/15/2023 Medical Rec #: [id number]           Accession #:    [id number] Date of Birth: 10-29-1945           Patient Gender: M Patient Age:   78 years Exam Location:  [facility] Procedure:      VAS US  LOWER EXTREMITY VENOUS (DVT) Referring Phys: Iridessa Harrow --------------------------------------------------------------------------------  Indications: Immobility.  Performing Technologist: Elmarie Lindau, RVT  Examination Guidelines: A complete evaluation includes B-mode imaging, spectral Doppler, color Doppler, and power Doppler as needed of all accessible portions of each vessel. Bilateral testing is considered an integral part of a complete examination. Limited examinations for reoccurring indications may be performed as noted. The reflux portion of the exam is performed with the patient in reverse Trendelenburg.  +---------+---------------+---------+-----------+----------+--------------+ RIGHT    CompressibilityPhasicitySpontaneityPropertiesThrombus Aging +---------+---------------+---------+-----------+----------+--------------+ CFV      Full           Yes      Yes                                 +---------+---------------+---------+-----------+----------+--------------+ SFJ      Full                                                        +---------+---------------+---------+-----------+----------+--------------+ FV Prox  Full                                                         +---------+---------------+---------+-----------+----------+--------------+ FV Mid   Full                                                        +---------+---------------+---------+-----------+----------+--------------+ FV DistalFull                                                        +---------+---------------+---------+-----------+----------+--------------+ PFV      Full                                                        +---------+---------------+---------+-----------+----------+--------------+ POP  Full           Yes      Yes                                 +---------+---------------+---------+-----------+----------+--------------+ PTV      Full                                                        +---------+---------------+---------+-----------+----------+--------------+ PERO     Full                                                        +---------+---------------+---------+-----------+----------+--------------+   +---------+---------------+---------+-----------+----------+--------------+ LEFT     CompressibilityPhasicitySpontaneityPropertiesThrombus Aging +---------+---------------+---------+-----------+----------+--------------+ CFV      Full           Yes      Yes                                 +---------+---------------+---------+-----------+----------+--------------+ SFJ      Full                                                        +---------+---------------+---------+-----------+----------+--------------+ FV Prox  Full                                                        +---------+---------------+---------+-----------+----------+--------------+ FV Mid   Full                                                        +---------+---------------+---------+-----------+----------+--------------+ FV DistalFull                                                         +---------+---------------+---------+-----------+----------+--------------+ PFV      Full                                                        +---------+---------------+---------+-----------+----------+--------------+ POP      Full           Yes      Yes                                 +---------+---------------+---------+-----------+----------+--------------+  PTV      Full                                                        +---------+---------------+---------+-----------+----------+--------------+ PERO     Full                                                        +---------+---------------+---------+-----------+----------+--------------+     Summary: RIGHT: - There is no evidence of deep vein thrombosis in the lower extremity.  - No cystic structure found in the popliteal fossa.  LEFT: - There is no evidence of deep vein thrombosis in the lower extremity.  - No cystic structure found in the popliteal fossa.  *See table(s) above for measurements and observations. Electronically signed by Lonni Gaskins MD on 09/15/2023 at 10:35:36 AM.    Final    DG Lumbar Spine 1 View Result Date: 09/12/2023 CLINICAL DATA:  [id number] Surgery, elective [id number] EXAM: LUMBAR SPINE - 1 VIEW COMPARISON:  X-ray lumbar spine 09/11/2023 9:10 p.m., MRI lumbar spine 09/11/2023 FINDINGS: Intraoperative surgical hardware posterior to the L4 and L5 vertebral body. Atherosclerotic plaque. IMPRESSION: Surgical hardware posterior to the L4 and L5 vertebral body. Electronically Signed   By: Morgane  Naveau M.D.   On: 09/12/2023 01:02   DG Lumbar Spine 2-3 Views Result Date: 09/12/2023 CLINICAL DATA:  [id number] Surgery, elective [id number]; surgery Localization images done on portable for L4-L5 Spinal fusion by Dr. Mavis Reason for exam: Lumbar Four - Five Decompression, Instrumentation and Fusion x-tables EXAM: LUMBAR SPINE - 2-3 VIEW; DG C-ARM 1-60 MIN-NO REPORT COMPARISON:  None Available. FINDINGS:  Intraoperative L4-L5 posterolateral and interbody surgical hardware. # low resolution intraoperative spot views of the lower lumbar spine were obtained. No fracture visible on the limited views. Total fluoroscopy time: 15.4 seconds Total radiation dose: 12.1 mGy IMPRESSION: Intraoperative L4-L5 posterolateral and interbody surgical hardware. Electronically Signed   By: Morgane  Naveau M.D.   On: 09/12/2023 00:56   DG C-Arm 1-60 Min-No Report Result Date: 09/11/2023 Fluoroscopy was utilized by the requesting physician.  No radiographic interpretation.   DG C-Arm 1-60 Min-No Report Result Date: 09/11/2023 Fluoroscopy was utilized by the requesting physician.  No radiographic interpretation.   Labs:  Basic Metabolic Panel: Recent Labs  Lab 09/20/23 0528 09/23/23 0523  NA 136 138  K 3.8 4.2  CL 98 99  CO2 29 26  GLUCOSE 112* 133*  BUN 19 15  CREATININE 1.00 1.15  CALCIUM  8.7* 8.9    CBC:    Latest Ref Rng & Units 09/23/2023    5:23 AM 09/20/2023    5:28 AM 09/16/2023    5:09 AM  CBC  WBC 4.0 - 10.5 K/uL 8.1  8.9  10.7   Hemoglobin 13.0 - 17.0 g/dL 86.9  86.2  84.2   Hematocrit 39.0 - 52.0 % 36.9  38.6  43.3   Platelets 150 - 400 K/uL 182  204  204      Liver function tests:     Latest Ref Rng & Units 09/23/2023    5:23 AM 09/20/2023    5:28 AM 09/17/2023    4:36 AM  Hepatic  Function  Total Protein 6.5 - 8.1 g/dL 6.0  5.9  6.0   Albumin 3.5 - 5.0 g/dL 3.1  3.1  3.1   AST 15 - 41 U/L 54  65  87   ALT 0 - 44 U/L 90  116  135   Alk Phosphatase 38 - 126 U/L 57  53  46   Total Bilirubin 0.0 - 1.2 mg/dL 0.9  0.8  0.9      CBG: No results for input(s): GLUCAP in the last 168 hours.  Brief HPI:   RYLIE KNIERIM II is a 78 y.o. male with history of CAD/ICM, combined CHF, HTN, new onset low back pain who was seen in the ED on 09/09/2023 and treated with dose of Decadron  and discharged to home however continued to have progressive pain with inability to walk.  He was readmitted on  09/11/2023 with reports of perineal numbness and urinary retention.  MRI lumbar spine revealed severe spinal stenosis L4 3/L4, L4/L5 with bulky disc extrusion L4/L5 and moderate spinal stenosis L2-L3.  Foley was placed due to urinary retention and he was taken to OR emergently for bilateral L4 and L5 laminectomy for decompression of nerve root and interbody fusion by Dr. Mavis on the same day.    Postop, he reported improvement in BLE sensation however was noted to have instability of BLE with mobility.  Foley was removed and patient was reported to be voiding without difficulty but reported issues with constipation.  Therapy was consulted and was working with patient was requiring min to mod assist with mobility and min assist with ADLs.  He was independent and working prior to admission.  CIR was recommended to functional decline.   Hospital Course: BRITTEN SEYFRIED II was admitted to rehab 09/14/2023 for inpatient therapies to consist of PT and OT at least three hours five days a week. Past admission physiatrist, therapy team and rehab RN have worked together to provide customized collaborative inpatient rehab.  BLE Dopplers done were negative for DVT and he was maintained on subcu Lovenox  for DVT prophylaxis.  Zestril  was decreased to 5 mg daily to avoid hypotension.  Pain has been reasonably controlled with use of scheduled Robaxin  as well as as needed Percocet.  He was educated on weaning this off after discharge. Follow up CBC showed leukocytosis likely due to steroids which has resolved.  Constipation has resolved with adjustment of bowel program.  Bladder function monitored with PVR checks and he is voiding without signs of retention.   Saddle anesthesia has resolved and he is continent of bowel and bladder.  Check of electrolytes showed prerenal azotemia and he was encouraged to increase p.o. fluids.  He was also noted to have hypokalemia therefore K dur added during his stay and d/c prior to  discharge as now WNL.  He was noted to have rising LFTs which has been monitored and is noted to be gradually resolving.   Back incision is healing well without any signs or symptoms of infection. He did report pain due to piriformis syndrome day prior to d/c and has been educated on exercises to help manage symptoms. He has made good progress during his rehab stay and is currently modified independent.  He will continue to receive outpatient therapy at [facility] outpatient rehab after discharge.   Rehab course: During patient's stay in rehab weekly team conferences were held to monitor patient's progress, set goals and discuss barriers to discharge. At admission, patient required min assist  with ADLs and mod assist with mobility. He  has had improvement in activity tolerance, balance, postural control as well as ability to compensate for deficits.  He is able to complete ADL tasks modified independent level.  He is modified independent with transfers and is able to ambulate 150' with use of RW. He is able to climb 12 stairs with CGA and cues for safety. Family education has been completed.   Discharge disposition: 01-Home or Self Care  Diet: Heart healthy  Special Instructions: Recommend repeat CMET to follow potassium and LFTs Wear brace at EOB and continue back precautions. No driving or strenuous activity till cleared by MD.   Discharge Instructions     Ambulatory referral to Physical Medicine Rehab   Complete by: As directed    Hospital follow up   Ambulatory referral to Physical Therapy   Complete by: As directed    Eval and treat      Allergies as of 09/22/2023       Reactions   Flonase  fluticasone  Propionate Other (See Comments)   Epistaxis         Medication List     STOP taking these medications    chlorthalidone  25 MG tablet Commonly known as: HYGROTON    COQ10 PO   mupirocin  ointment 2 % Commonly known as: BACTROBAN        TAKE these medications     acetaminophen  325 MG tablet Commonly known as: TYLENOL  Take 2 tablets (650 mg total) by mouth every 4 (four) hours as needed for mild pain (pain score 1-3). What changed: reasons to take this   ascorbic acid  1000 MG tablet Commonly known as: VITAMIN C  Take 1 tablet (1,000 mg total) by mouth daily after supper.   aspirin  EC 81 MG tablet Take 81 mg by mouth daily.   ezetimibe  10 MG tablet Commonly known as: ZETIA  TAKE ONE TABLET BY MOUTH EVERY DAY   lisinopril  5 MG tablet Commonly known as: ZESTRIL  Take 1 tablet (5 mg total) by mouth daily. Start taking on: September 23, 2023 What changed:  medication strength how much to take   methocarbamol  750 MG tablet Commonly known as: ROBAXIN  Take 1 tablet (750 mg total) by mouth 4 (four) times daily. What changed:  medication strength how much to take when to take this   metoprolol  succinate 25 MG 24 hr tablet Commonly known as: TOPROL -XL TAKE 1 TABLET BY MOUTH ONCE DAILY. What changed: when to take this   nitroGLYCERIN  0.4 MG SL tablet Commonly known as: NITROSTAT  PLACE 1 TAB UNDER TONGUE EVERY 5 MIN IF NEEDED FOR CHEST PAIN. MAY USE 3 TIMES.NO RELIEF CALL 911.   oxyCODONE -acetaminophen  5-325 MG tablet--Rx # 40 pills,  Commonly known as: PERCOCET/ROXICET Take 1 tablet by mouth every 8 (eight) hours as needed for severe pain (pain score 7-10) (ankles/feet).   polyethylene glycol 17 g packet Commonly known as: MIRALAX  / GLYCOLAX  Take 17 g by mouth daily.   rosuvastatin  40 MG tablet Commonly known as: CRESTOR  Take 1 tablet (40 mg total) by mouth daily. What changed: when to take this   tamsulosin  0.4 MG Caps capsule Commonly known as: FLOMAX  Take 1 capsule (0.4 mg total) by mouth daily after breakfast.   traZODone  50 MG tablet Commonly known as: DESYREL  Take 0.5-1 tablets (25-50 mg total) by mouth at bedtime as needed for sleep.   VITAMIN B-12 PO Take 1 tablet by mouth at bedtime.   Wegovy  0.5 MG/0.5ML Soaj SQ  injection Generic drug: semaglutide -weight management  Inject 0.5 mg into the skin every 7 (seven) days. What changed: when to take this        Follow-up Information     [name], MD. Call.   Specialty: Internal Medicine Contact information: [address] [place] [address] [phone]         Cornelio Bouchard, MD Follow up.   Specialty: Physical Medicine and Rehabilitation Why: office will call you with follow up appointment Contact information: [address] [place] [address] [phone]         [name], MD Follow up.   Specialty: Neurosurgery Why: Call in 1-2 days for post hospital follow up Contact information: [address] [place] [address] [phone]                 Signed: Sharlet GORMAN Schmitz 09/26/2023, 11:23 PM

## 2023-09-22 NOTE — Discharge Instructions (Addendum)
 Inpatient Rehab Discharge Instructions  YANG RACK II Discharge date and time:  09/23/23  Activities/Precautions/ Functional Status: Activity: no lifting, driving, or strenuous exercise till cleared by MD Diet: regular diet Wound Care: keep wound clean and dry   Functional status:  ___ No restrictions     ___ Walk up steps independently _X__ 24/7 supervision/assistance   ___ Walk up steps with assistance ___ Intermittent supervision/assistance  ___ Bathe/dress independently _X__ Walk with walker    ___ Bathe/dress with assistance ___ Walk Independently    ___ Shower independently ___ Walk with assistance    _X__ Shower with assistance _X__ No alcohol     ___ Return to work/school ________   Special Instructions:  COMMUNITY REFERRALS UPON DISCHARGE:    Outpatient: PT             Agency:[facility]  Phone:[phone]              Appointment Date/Time:*Please expect follow-up within 7-10 business days to schedule your appointment. If you have not received follow-up, be sure to contact the site directly.*   Medical Equipment/Items Ordered:rolling walker                                                  Agency/Supplier:[facility] [phone]    My questions have been answered and I understand these instructions. I will adhere to these goals and the provided educational materials after my discharge from the hospital.  Patient/Caregiver Signature _______________________________ Date __________  Clinician Signature _______________________________________ Date __________  Please bring this form and your medication list with you to all your follow-up doctor's appointments.

## 2023-09-23 ENCOUNTER — Other Ambulatory Visit (HOSPITAL_COMMUNITY): Payer: Self-pay

## 2023-09-23 DIAGNOSIS — G834 Cauda equina syndrome: Secondary | ICD-10-CM | POA: Diagnosis not present

## 2023-09-23 LAB — COMPREHENSIVE METABOLIC PANEL WITH GFR
ALT: 90 U/L — ABNORMAL HIGH (ref 0–44)
AST: 54 U/L — ABNORMAL HIGH (ref 15–41)
Albumin: 3.1 g/dL — ABNORMAL LOW (ref 3.5–5.0)
Alkaline Phosphatase: 57 U/L (ref 38–126)
Anion gap: 13 (ref 5–15)
BUN: 15 mg/dL (ref 8–23)
CO2: 26 mmol/L (ref 22–32)
Calcium: 8.9 mg/dL (ref 8.9–10.3)
Chloride: 99 mmol/L (ref 98–111)
Creatinine, Ser: 1.15 mg/dL (ref 0.61–1.24)
GFR, Estimated: >60 mL/min (ref >60)
Glucose, Bld: 133 mg/dL — ABNORMAL HIGH (ref 70–99)
Potassium: 4.2 mmol/L (ref 3.5–5.1)
Sodium: 138 mmol/L (ref 135–145)
Total Bilirubin: 0.9 mg/dL (ref 0.0–1.2)
Total Protein: 6 g/dL — ABNORMAL LOW (ref 6.5–8.1)

## 2023-09-23 LAB — CBC
HCT: 36.9 % — ABNORMAL LOW (ref 39.0–52.0)
Hemoglobin: 13 g/dL (ref 13.0–17.0)
MCH: 32.3 pg (ref 26.0–34.0)
MCHC: 35.2 g/dL (ref 30.0–36.0)
MCV: 91.6 fL (ref 80.0–100.0)
Platelets: 182 K/uL (ref 150–400)
RBC: 4.03 MIL/uL — ABNORMAL LOW (ref 4.22–5.81)
RDW: 13 % (ref 11.5–15.5)
WBC: 8.1 K/uL (ref 4.0–10.5)
nRBC: 0 % (ref 0.0–0.2)

## 2023-09-23 MED ORDER — OXYCODONE HCL 5 MG PO TABS
5.0000 mg | ORAL_TABLET | Freq: Three times a day (TID) | ORAL | 0 refills | Status: DC | PRN
  Filled 2023-09-23: qty 40, 7d supply, fill #0

## 2023-09-23 MED ORDER — OXYCODONE HCL 5 MG PO TABS
5.0000 mg | ORAL_TABLET | Freq: Three times a day (TID) | ORAL | Status: DC | PRN
  Filled 2023-09-23: qty 2

## 2023-09-23 NOTE — Progress Notes (Signed)
 PROGRESS NOTE   Subjective/Complaints:  Pt reports needed to take Percocet 2x last night- sounds like could be piriformis syndrome, based on Sx's- went up into L buttock last night.  LLE to hip.  Glutes TTP on L side    Went over needs to wear brace and driving are [facility] pur[facility]- also went over procedure of surgery.   ROS:  Per HPI   Pt denies SOB, abd pain, CP, N/V/C/D, and vision changes     Per HPI Objective:   No results found.  Recent Labs    09/23/23 0523  WBC 8.1  HGB 13.0  HCT 36.9*  PLT 182    Recent Labs    09/23/23 0523  NA 138  K 4.2  CL 99  CO2 26  GLUCOSE 133*  BUN 15  CREATININE 1.15  CALCIUM  8.9    Intake/Output Summary (Last 24 hours) at 09/23/2023 0841 Last data filed at 09/23/2023 0745 Gross per 24 hour  Intake 480 ml  Output --  Net 480 ml        Physical Exam: Vital Signs Blood pressure 118/67, pulse 83, temperature 98.3 F (36.8 C), resp. rate 16, height 5' 10 (1.778 m), weight 94.1 kg, SpO2 97%.      General: awake, alert, appropriate, sitting up in w/c; wife at side; NAD HENT: conjugate gaze; oropharynx moist CV: regular rate and rhythm; no JVD Pulmonary: CTA B/L; no W/R/R- good air movement GI: soft, NT, ND, (+)BS Psychiatric: appropriate Neurological: Ox3 MSK: TTP over L piriformis  Skin- incision covered with tegaderm- healing- glue in place MSK: 4+ to 5-/5 in LE's B/L- throughout- more weak in HF and DF B/L Ext: no clubbing, cyanosis, or edema Psych: pleasant and cooperative  Skin: Clean and intact without signs of breakdown. Back incision remains CDI with honeycomb dressing in place.  Musculoskeletal: no pain with B ankle ROM and palpation   Assessment/Plan: 1. Functional deficits which require 3+ hours per day of interdisciplinary therapy in a comprehensive inpatient rehab setting. Physiatrist is providing close team supervision and 24 hour management  of active medical problems listed below. Physiatrist and rehab team continue to assess barriers to discharge/monitor patient progress toward functional and medical goals  Care Tool:  Bathing    Body parts bathed by patient: Right arm, Left arm, Chest, Abdomen, Front perineal area, Buttocks, Right upper leg, Left upper leg, Right lower leg, Left lower leg, Face         Bathing assist Assist Level: Independent with assistive device     Upper Body Dressing/Undressing Upper body dressing   What is the patient wearing?: Pull over shirt, Orthosis Orthosis activity level: Performed by patient  Upper body assist Assist Level: Independent    Lower Body Dressing/Undressing Lower body dressing      What is the patient wearing?: Pants     Lower body assist Assist for lower body dressing: Independent with assitive device     Toileting Toileting    Toileting assist Assist for toileting: Independent with assistive device     Transfers Chair/bed transfer  Transfers assist     Chair/bed transfer assist level: Supervision/Verbal cueing     Locomotion  Ambulation   Ambulation assist      Assist level: Supervision/Verbal cueing Assistive device: Walker-rolling Max distance: 200   Walk 10 feet activity   Assist     Assist level: Supervision/Verbal cueing Assistive device: Walker-rolling   Walk 50 feet activity   Assist Walk 50 feet with 2 turns activity did not occur: Safety/medical concerns (fatigue)  Assist level: Supervision/Verbal cueing Assistive device: Walker-rolling    Walk 150 feet activity   Assist Walk 150 feet activity did not occur: Safety/medical concerns (fatigue)  Assist level: Supervision/Verbal cueing Assistive device: Walker-rolling    Walk 10 feet on uneven surface  activity   Assist Walk 10 feet on uneven surfaces activity did not occur: Safety/medical concerns (fatigue)   Assist level: Contact Guard/Touching assist Assistive  device: Walker-rolling   Wheelchair     Assist Is the patient using a wheelchair?: Yes Type of Wheelchair: Manual    Wheelchair assist level: Supervision/Verbal cueing Max wheelchair distance: 150    Wheelchair 50 feet with 2 turns activity    Assist        Assist Level: Supervision/Verbal cueing   Wheelchair 150 feet activity     Assist      Assist Level: Supervision/Verbal cueing   Blood pressure 118/67, pulse 83, temperature 98.3 F (36.8 C), resp. rate 16, height 5' 10 (1.778 m), weight 94.1 kg, SpO2 97%.  Medical Problem List and Plan: 1. Functional deficits secondary to Cauda Equina syndrome/Lumbar Radiculoapthy s/p  L4-5 decompression and fusion by Dr. Mavis on 09/11/2023              -patient may shower, cover incision please             -ELOS/Goals: 7 days, PT/OT mod I to Sup        D/C today  Will need f/u with me and Fidela  Will need 7 days of pain meds- 5-10 mg 3x/day as needed- please give 40 tabs- since new pain from probable piriformis syndrome 2.  Antithrombotics:  -DVT/anticoagulation:  lovenox              -antiplatelet therapy: ASA 81 3. Pain Management: Tylenol  prn. Oxycodone  prn. Continue Robaxin  500 mg BID--change to prn.   9/2- given robaxin - will change to 750mg  q6 hours prn- and suggested to take after dinner to kick in along with use kpad.   9/3- required percocet last night around 2am for pain- waited from 9:30 to 2am til ask for something stronger- advised to take when it starts to prevent escalation of pain.   9/4- required Percocet 2x last night due to severe pain  - demonstrated stretches for piriformis syndrome and tennis ball how to use.  -changed Percocet to oxycodone  to reduce amount of tylenol - due to transaminitis 4. Mood/Behavior/Sleep: LCSW to follow for evaluation and support.              --Melatonin prn for insomnia.              -antipsychotic agents: N/A 5. Neuropsych/cognition: This patient is capable of making  decisions on his own behalf. 6. Skin/Wound Care: Routine pressure relief measures.  7. Fluids/Electrolytes/Nutrition: Monitor I/O. Check CMET in am 8.  HTN: Monitor BP TID--on Zestril  and Toprol  XL. Will set decrease Zestril  to 10 mg and place hold parameters.  Monitor with activity.  --Blood pressures soft -  orthostatic vitals appear unremarkable so far - TEDs ordered. Binder prn.   -acclimate Vitals:   09/22/23 1258 09/22/23 1955  BP: 101/61 118/67  Pulse: 79 83  Resp: 20 16  Temp: 98.7 F (37.1 C) 98.3 F (36.8 C)  SpO2: 99% 97%  Reduce zestril  to 5mg  8/31 9/1-9/3- BP doing better- con't regimen 9. CAD/ICM/chronic combined CHF: Monitor weight daily and for signs of overload.             --Heart healthy diet. On Toprol , Zestril , chorthalidone, Crestor . Off ASA due to surgery             --monitor for symptoms with increase in activity.  8/29- looking better/weight stable on daily weights  9/1- Weights stable- con't regimen  9/3- no weight seen, but no signs of swelling 10. Abnormal LFTs: ALT-65/AST-90/TB-1.6 @ admission.              --LFT's continue to climb--in 100's today   -pt on zetia  and crestor  but these are chronic meds   -may be just reactive   -will reach out to pharmacy  -8/28--sl increase again in LFT's. Unclear etiology (116/149)   -will recheck tomorrow   -potassium a little low 3.4, will add supp  8/29- is down trending- will decrease tylenol  dosing and wait to decrease Statin since doing better- will recheck Monday  9/1- LFT's 65/116- still trending down- is better, so still will wait ot reduce statin due to his heavy cardiac hx.   9/4- LFTs- 54/90- down from 65/116 11. Leukocytosis: WBC up to 10.6 @ admission likely due to steroids. Will recheck in am.             --no  fevers and other signs of infection.   -8/28 10.7, observe only for now 8/29- will recheck Monday unless has Sx's of infection over weekend- monitor clinically  9/3- WBC down to 8.9k 12  Neurogenic bowel: On Miralax  and colace-->had positive results with suppository yesterday --started on bowel program --suppositories cause significant spasms, will stop. He's having sensation, urge to empty, too -continue orals in AM, up to toilet in PM    8/29- had 4 continent Bms yesterday 9/1- LBM this AM 9/2- going more regularly 13. Neurogenic bladder?: On Flomax -->change to evenings to avoid hypotension.  Monitor PVR/Bladder scan  -EOB or toilet to void, timed voids  14. Pre-renal azotemia: BUN-34 @ admission.    -BUN/Cr a little worse today  -push fluids  -hold hygroton   -8/28 labs a little better today, continue to push fluids  8/29- BUN/Cr about the same- Cr up to 1.26- But BUN right around the same-will recheck Monday and follow /push fluids  9/1- BUN 19- doing much better  9/4- Cr 1.15 and BUN 15 15.  Pre-diabetes: Hgb A1C- 6.2 (08/02/23)--started on Wegovy  a few months ago.    I spent a total of 42   minutes on total care today- >50% coordination of care- due to  Prolonged d/w pt and wife about driving, surgery, and brace as well as pain meds  LOS: 9 days A FACE TO FACE EVALUATION WAS PERFORMED  Makaley Storts 09/23/2023, 8:41 AM

## 2023-09-23 NOTE — Progress Notes (Signed)
 Met with patient to review current situation, team conference and plan of care. Reviewed lumbosacral brace, pain, bowel and bladder. Continue to follow along to provide educational needs to facilitate preparation for discharge.

## 2023-09-23 NOTE — Telephone Encounter (Signed)
 Called and spoke to the patient scheduled for 9/18 at 11 am

## 2023-09-23 NOTE — Progress Notes (Signed)
 Inpatient Rehabilitation Discharge Medication Review by a Pharmacist  A complete drug regimen review was completed for this patient to identify any potential clinically significant medication issues.  High Risk Drug Classes Is patient taking? Indication by Medication  Antipsychotic No   Anticoagulant No   Antibiotic No   Opioid Yes Percocet for acute pain   Antiplatelet Yes Aspirin  for CAD  Hypoglycemics/insulin No   Vasoactive Medication Yes Metoprolol  for HFrEF NTG for Angina   Chemotherapy No   Other Yes Acetaminophen  for mild pain Ezetimibe  and rosuvastatin  for HLD Lisinopril  for HFrEF Methocarbamol  for muscle spasms Miralax  for constipation Tamsulosin  for urinary retention Trazodone  for sleep Wegovy  for weight loss     Type of Medication Issue Identified Description of Issue Recommendation(s)  Drug Interaction(s) (clinically significant)     Duplicate Therapy     Allergy     No Medication Administration End Date     Incorrect Dose     Additional Drug Therapy Needed     Significant med changes from prior encounter (inform family/care partners about these prior to discharge). NEW vit C/B12, tamsulosin , acetaminophen , methocarbamol , oxycodone   DECREASE lisinopril   STOP chlorthalidone , coQ10, naproxen , prednisone  Team to review       Updated AVS with discontinued meds  Other       Clinically significant medication issues were identified that warrant physician communication and completion of prescribed/recommended actions by midnight of the next day:  No  Name of provider notified for urgent issues identified:   Provider Method of Notification:     Pharmacist comments:   Time spent performing this drug regimen review (minutes):  20  Jinnie Door, PharmD, BCPS, [facility] Clinical Pharmacist  Please check AMION for all [facility] phone numbers After 10:00 PM, call Main Pharmacy [phone]

## 2023-09-24 DIAGNOSIS — G834 Cauda equina syndrome: Secondary | ICD-10-CM | POA: Diagnosis not present

## 2023-09-24 NOTE — Progress Notes (Signed)
 Inpatient Rehabilitation Care Coordinator Discharge Note   Patient Details  Name: Austin Bright MRN: [id number] Date of Birth: [birth year]   Discharge location: D/c to home  Length of Stay: 8 days  Discharge activity level: Supervision  Home/community participation: Limited  Patient response un:Yzjouy Literacy - How often do you need to have someone help you when you read instructions, pamphlets, or other written material from your doctor or pharmacy?: Rarely  Patient response un:Dnrpjo Isolation - How often do you feel lonely or isolated from those around you?: Rarely  Services provided included: MD, RD, PT, OT, RN, CM, TR, Pharmacy, Neuropsych, SW  Financial Services:  Field seismologist Utilized: Private Insurance [employer]  Choices offered to/list presented to: patient and wife  Follow-up services arranged:  Outpatient, DME    Outpatient Servicies: Austin Bright PT only DME : [employer] for RW    Patient response to transportation need: Is the patient able to respond to transportation needs?: Yes In the past 12 months, has lack of transportation kept you from medical appointments or from getting medications?: No In the past 12 months, has lack of transportation kept you from meetings, work, or from getting things needed for daily living?: No   Patient/Family verbalized understanding of follow-up arrangements:  Yes  Individual responsible for coordination of the follow-up plan: contact pt  Confirmed correct DME delivered: Austin Bright 09/24/2023    Comments (or additional information):  Summary of Stay    Date/Time Discharge Planning CSW  09/20/23 1138 Pt will d/c to home with wife who intends to take two weeks off at discharge. Support from their dtr as well. SW will confirm there are no barriers to discharge. AAC       Austin Bright Austin Bright

## 2023-09-26 DIAGNOSIS — R7989 Other specified abnormal findings of blood chemistry: Secondary | ICD-10-CM | POA: Insufficient documentation

## 2023-09-30 NOTE — Therapy (Unsigned)
 OUTPATIENT PHYSICAL THERAPY THORACOLUMBAR EVALUATION   Patient Name: Austin Bright MRN: [id number] DOB:1945/03/14, 78 y.o., male Today's Date: 10/01/2023  END OF SESSION:  PT End of Session - 10/01/23 1258     Visit Number 1    Number of Visits 16    Date for PT Re-Evaluation 10/29/23    Authorization Type [employer]    Authorization Time Period Auth requested    Progress Note Due on Visit 10    PT Start Time 1300    PT Stop Time 1345 (P)     PT Time Calculation (min) 45 min (P)     Equipment Utilized During Treatment Gait belt;Back brace (P)     Activity Tolerance Patient tolerated treatment well    Behavior During Therapy [facility] for tasks assessed/performed          Past Medical History:  Diagnosis Date   Acute diverticulitis 08/17/2020   Acute MI (HCC) 08/2011   Arthritis    Bilateral pneumonia    Diagnosed after STEMI 08/2011   CAD (coronary artery disease)    a. Diagnosed 08/2011 with anterior STEMI due to thrombotic occlusion of mid LAD s/p thrombectomy, PTCA, DES placement 08/23/11.   CHF (congestive heart failure) (HCC)    Chronic combined systolic and diastolic congestive heart failure (HCC) 09/15/2011   Dyslipidemia    Gout    Hypertension    Ischemic cardiomyopathy    a. Initial EF 35% by cath 08/23/11, improved to 40-45% by echo 08/25/11. 50-55% by echo November 2013.   Rotator cuff tear arthropathy, right    Shortness of breath    Past Surgical History:  Procedure Laterality Date   carpel tunnel Bilateral 2002   COLONOSCOPY WITH PROPOFOL  N/A 04/08/2017   Surgeon: [name], MD;  diverticulosis in sigmoid and descending colon, internal hemorrhoids, otherwise normal exam.  No recommendations to repeat due to age.   COLONOSCOPY WITH PROPOFOL  N/A 03/17/2021   Procedure: COLONOSCOPY WITH PROPOFOL ;  Surgeon: [name], DO;  Location: [facility];  Service: Endoscopy;  Laterality: N/A;  8:00am   CORONARY STENT PLACEMENT     CYSTECTOMY  1969    pilonidal cyst   LEFT AND RIGHT HEART CATHETERIZATION WITH CORONARY ANGIOGRAM N/A 09/15/2011   Procedure: LEFT AND RIGHT HEART CATHETERIZATION WITH CORONARY ANGIOGRAM;  Surgeon: [name], MD;  Location: [facility] CATH LAB;  Service: Cardiovascular;  Laterality: N/A;   LEFT HEART CATH N/A 08/23/2011   Procedure: LEFT HEART CATH;  Surgeon: [name], MD;  Location: [facility] CATH LAB;  Service: Cardiovascular;  Laterality: N/A;   NO PAST SURGERIES     PERCUTANEOUS CORONARY STENT INTERVENTION (PCI-S)  08/23/2011   Procedure: PERCUTANEOUS CORONARY STENT INTERVENTION (PCI-S);  Surgeon: [name], MD;  Location: [facility] CATH LAB;  Service: Cardiovascular;;   Patient Active Problem List   Diagnosis Date Noted   Abnormal LFTs 09/26/2023   Cauda equina compression (HCC) 09/14/2023   Cauda equina syndrome with neurogenic bladder (HCC) 09/12/2023   Spinal stenosis 09/11/2023   Tendinopathy of right rotator cuff 02/03/2023   Mixed hyperlipidemia 01/23/2022   Encounter for general adult medical examination with abnormal findings 07/18/2021   Prediabetes 07/18/2021   Vitamin D  deficiency 07/18/2021   History of diverticulitis 02/24/2021   Pancreatic lesion 02/24/2021   Prostate cancer screening 01/30/2021   Primary insomnia 01/27/2021   Hepatomegaly    Essential hypertension 08/18/2020   Idiopathic gout 11/16/2018   Neck mass 12/31/2015  Old anteroseptal myocardial infarction 08/24/2011   Coronary artery disease involving native coronary artery of native heart without angina pectoris 08/24/2011    PCP: [name], MD  REFERRING PROVIDER: Maurice Sharlet RAMAN, PA-C  REFERRING DIAG:  G83.4 (ICD-10-CM) - Cauda equina compression [facility])    Rationale for Evaluation and Treatment: Rehabilitation  THERAPY DIAG:  Cauda equina syndrome with neurogenic bladder (HCC)  Muscle weakness  Impaired functional mobility, balance, gait, and endurance  ONSET DATE: September 09, 2023  SUBJECTIVE:                                                                                                                                                                                            SUBJECTIVE STATEMENT: Pt reports he was pain free until August 21st. Reports he woke up the next day with low back and LE pain. Reports he went to ER but they sent him home. N/T started a few days after that and he went back to ER, they sent him to [facility] and he had surgery the next day. Reports he had inpatient rehab for 1 week post-op. Pt reports he had a follow up this morning. Doctor pleased with progress.   PERTINENT HISTORY:  Lumbar decompression, fusion, L4-5 on 8/23 R shoulder Rotator cuff pain  PAIN:  Are you having pain? No  PRECAUTIONS: Back, Pt wearing back brace, Reports under BLT precautions (bending, lifting, twisting)  - Plans to follow up with MD regarding timeline of precautions   RED FLAGS: Reports cont N/T down both LE, most in buttocks    WEIGHT BEARING RESTRICTIONS: No  FALLS:  Has patient fallen in last 6 months? No  LIVING ENVIRONMENT: Lives with: lives with their spouse Lives in: House/apartment Stairs: Yes: Internal: 13 steps; can reach both and External: 2 steps; none Has following equipment at home: Vannie - 2 wheeled and Shower bench, Back brace  OCCUPATION: N/A  PLOF: Reports bathing at sink, independent with dressing, reports wife doing all cleaning now, can prepare own meals   PATIENT GOALS: For 100% recovery, mobile, active, golf (last played last year)  NEXT MD VISIT: October 17th  OBJECTIVE:  Note: Objective measures were completed at Evaluation unless otherwise noted.  DIAGNOSTIC FINDINGS:  IMPRESSION: 1. Bulky disc extrusion at L4-L5 with very Severe spinal stenosis. Lateral recess stenosis may be greater on the left. Query L5 radiculitis. 2. Superimposed multifactorial Severe spinal stenosis also at L3-L4, and moderate spinal stenosis at L2-L3. Up to  moderate bilateral L2 neural foraminal stenosis.  IMPRESSION: Intraoperative L4-L5 posterolateral and interbody surgical hardware.    PATIENT SURVEYS:  Modified Oswestry: Modified Oswestry  Low Back Pain Disability Questionnaire: 20 / 50 = 40.0 %  Interpretation of scores: Score Category Description  0-20% Minimal Disability The patient can cope with most living activities. Usually no treatment is indicated apart from advice on lifting, sitting and exercise  21-40% Moderate Disability The patient experiences more pain and difficulty with sitting, lifting and standing. Travel and social life are more difficult and they may be disabled from work. Personal care, sexual activity and sleeping are not grossly affected, and the patient can usually be managed by conservative means  41-60% Severe Disability Pain remains the main problem in this group, but activities of daily living are affected. These patients require a detailed investigation  61-80% Crippled Back pain impinges on all aspects of the patient's life. Positive intervention is required  81-100% Bed-bound  These patients are either bed-bound or exaggerating their symptoms  Bluford FORBES Zoe DELENA Karon DELENA, et al. Surgery versus conservative management of stable thoracolumbar fracture: the PRESTO feasibility RCT. [place] ([place]): [employer]; 2021 Nov. [facility] Technology Assessment, No. 25.62.) Appendix 3, Oswestry Disability Index category descriptors. Available from: FindJewel[employer].cz  Minimally Clinically Important Difference (MCID) = 12.8% LEFS  Extreme difficulty/unable (0), Quite a bit of difficulty (1), Moderate difficulty (2), Little difficulty (3), No difficulty (4) Survey date:    Any of your usual work, housework or school activities   2. Usual hobbies, recreational or sporting activities   3. Getting into/out of the bath   4. Walking between rooms   5. Putting on socks/shoes   6. Squatting     7. Lifting an object, like a bag of groceries from the floor   8. Performing light activities around your home   9. Performing heavy activities around your home   10. Getting into/out of a car   11. Walking 2 blocks   12. Walking 1 mile   13. Going up/down 10 stairs (1 flight)   14. Standing for 1 hour   15.  sitting for 1 hour   16. Running on even ground   17. Running on uneven ground   18. Making sharp turns while running fast   19. Hopping    20. Rolling over in bed   Score total:  Lower Extremity Functional Score: 45 / 80 = 56.3 %      COGNITION: Overall cognitive status: Within functional limits for tasks assessed     SENSATION: WFL,   POSTURE: rounded shoulders and forward head  PALPATION:  N/A  LUMBAR ROM:   AROM eval  Flexion   Extension   Right lateral flexion   Left lateral flexion   Right rotation   Left rotation    (Blank rows = not tested)  LOWER EXTREMITY ROM:     Active  Right eval Left eval  Hip flexion    Hip extension    Hip abduction    Hip adduction    Hip internal rotation    Hip external rotation    Knee flexion    Knee extension    Ankle dorsiflexion    Ankle plantarflexion    Ankle inversion    Ankle eversion     (Blank rows = not tested)  LOWER EXTREMITY MMT:    MMT Right eval Left eval  Hip flexion 4 4  Hip extension 2+ 2+  Hip abduction 3 3-  Hip adduction    Hip internal rotation    Hip external rotation    Knee flexion    Knee extension  Ankle dorsiflexion 5 5  Ankle plantarflexion    Ankle inversion    Ankle eversion     (Blank rows = not tested)  LUMBAR SPECIAL TESTS:    FUNCTIONAL TESTS:  30 seconds chair stand test 2 minute walk test: Next Session  30 second chair stand: 6 STS, no UE use  Tandem balance: 26 with RLE leading, 25 with LLE leading SLS: unable with BLE  GAIT: Distance walked: 100 ft in session  Assistive device utilized: Walker - 2 wheeled Level of assistance: Modified  independence Comments: Dec speed, ambulates with RW, back brace donned, slight forward posture,   TREATMENT DATE:  10/01/23: PT Eval and HEP                                                                                                                            PATIENT EDUCATION:  Education details: PT evaluation, objective findings, POC, Importance of HEP, Precautions, Clinic policies  Person educated: Patient Education method: Explanation and Demonstration Education comprehension: verbalized understanding and returned demonstration  HOME EXERCISE PROGRAM: Access Code: [email or web address] URL: [email or web address] Date: 10/01/2023 Prepared by: Rosaria Powell-Butler  Exercises - Clamshell  - 2 x daily - 7 x weekly - 3 sets - 10 reps - Supine Bridge  - 2 x daily - 7 x weekly - 3 sets - 10 reps - Seated Long Arc Quad  - 2 x daily - 7 x weekly - 3 sets - 10 reps - Sit to Stand  - 2 x daily - 7 x weekly - 3 sets - 10 reps - Standing Tandem Balance with Counter Support  - 2 x daily - 7 x weekly - 3 sets - 10 reps - 30 hold  ASSESSMENT:  CLINICAL IMPRESSION: Patient is a 78 y.o. male who was seen today for physical therapy evaluation and treatment for  G83.4 (ICD-10-CM) - Cauda equina compression (HCC)  . On this date, patient demonstrates decreased self perceived function via Modified oswestry as well as LEFS, decreased LE strength, impaired balance, altered gait, and decreased activity tolerance all which may be contributing to patients impaired overall function. Patient reports he's maintaining his no lifting,bending, twisting precautions and wearing brace when OOB. Will follow up with surgeon regarding timeline of precautions and brace wear. Patient will benefit from continued skilled physical therapy in order to address the above/below to improve current level of function for better quality of life.   OBJECTIVE IMPAIRMENTS: Abnormal gait, decreased activity tolerance, decreased  balance, decreased endurance, decreased mobility, difficulty walking, decreased ROM, decreased strength, and postural dysfunction.   ACTIVITY LIMITATIONS: lifting, bending, standing, squatting, sleeping, stairs, and transfers  PARTICIPATION LIMITATIONS: meal prep, cleaning, laundry, driving, community activity, and yard work  PERSONAL FACTORS: N/A are also affecting patient's functional outcome.   REHAB POTENTIAL: Good  CLINICAL DECISION MAKING: Stable/uncomplicated  EVALUATION COMPLEXITY: Low   GOALS: Goals reviewed with patient? No  SHORT TERM GOALS: Target date: 10/22/23 Patient will be independent  with performance of HEP to demonstrate adequate self management of symptoms.  Baseline:  Goal status: INITIAL  2.   Patient will report at least a 25% improvement with function and/or pain reduction overall since beginning PT. Baseline:  Goal status: INITIAL   LONG TERM GOALS: Target date: 11/11/23 Patient will improve LEFS score by 9 points to demonstrate improved perceived function while meeting MCID.  Baseline: Goal status: INITIAL Patient will improve Modified Oswestry score by 12.8% to demonstrate improved perceived function while meeting MCID.  Baseline: Goal status: INITIAL 3.  Patient will score at least a  4/5  on hip extension and abduction MMT with BLE to demonstrate increased LE strength and/or power needed to improve ambulation/gait mechanics.  Baseline:  Goal status: INITIAL   4.  Patient will increase 2 minute walk test gait distance by at least 40 ft with least restrictive assistive device to demonstrate improved endurance and functional mobility needed for ambulating household and community distances.  Baseline: Goal status: INITIAL 5. Patient will be able to maintain SL balance on each LE for at least 5 to demonstrate improved balance needed to reduce risk of falls.  Baseline: Goal status: INITIAL   PLAN:  PT FREQUENCY: 2x/week  PT DURATION: 8  weeks  PLANNED INTERVENTIONS: 97164- PT Re-evaluation, 97110-Therapeutic exercises, 97530- Therapeutic activity, [id number]- Neuromuscular re-education, 97535- Self Care, [id number]- Manual therapy, [id number]- Gait training, [phone]- Electrical stimulation (manual), [id number]- Traction (mechanical), [phone] (1-2 muscles), [id number] (3+ muscles)- Dry Needling, Patient/Family education, Balance training, Stair training, Taping, Joint mobilization, Spinal mobilization, Cryotherapy, and Moist heat.  PLAN FOR NEXT SESSION: Review HEP and goals, 2 minute walk test, progress LE strengthening, balance, reduce AD as appropriate   4:57 PM, 10/01/23 Rosaria Settler, PT, DPT [facility]   [employer] Auth Request Treatment Start Date: 10/01/23  Referring diagnosis code (ICD 10)?  G83.4 (ICD-10-CM) - Cauda equina compression (HCC)   Treatment diagnosis codes (ICD 10)? (if different than referring diagnosis)  G83.4 (ICD-10-CM) - Cauda equina compression (HCC)  Z74.09 M62.81  What was this (referring dx) caused by? []  Surgery []  Fall [x]  Ongoing issue []  Arthritis [x]  Other: Cauda Equina/spinal cord compression  Laterality: []  Rt []  Lt [x]  Both  Deficits: []  Pain []  Stiffness [x]  Weakness []  Edema [x]  Balance Deficits [x]  Coordination [x]  Gait Disturbance []  ROM []  Other   Functional Tool Score:  Modified Oswestry: Modified Oswestry Low Back Pain Disability Questionnaire: 20 / 50 = 40.0 %  Lower Extremity Functional Score: 45 / 80 = 56.3 %   CPT codes: See Planned Interventions listed in the Plan section of the Evaluation.

## 2023-10-01 ENCOUNTER — Encounter (HOSPITAL_COMMUNITY): Payer: Self-pay

## 2023-10-01 ENCOUNTER — Ambulatory Visit (HOSPITAL_COMMUNITY): Attending: Physical Medicine and Rehabilitation

## 2023-10-01 ENCOUNTER — Other Ambulatory Visit: Payer: Self-pay

## 2023-10-01 DIAGNOSIS — Z7409 Other reduced mobility: Secondary | ICD-10-CM | POA: Diagnosis not present

## 2023-10-01 DIAGNOSIS — M48062 Spinal stenosis, lumbar region with neurogenic claudication: Secondary | ICD-10-CM | POA: Diagnosis not present

## 2023-10-01 DIAGNOSIS — G834 Cauda equina syndrome: Secondary | ICD-10-CM | POA: Insufficient documentation

## 2023-10-01 DIAGNOSIS — M6281 Muscle weakness (generalized): Secondary | ICD-10-CM | POA: Insufficient documentation

## 2023-10-01 DIAGNOSIS — Z6830 Body mass index (BMI) 30.0-30.9, adult: Secondary | ICD-10-CM | POA: Diagnosis not present

## 2023-10-01 DIAGNOSIS — M5416 Radiculopathy, lumbar region: Secondary | ICD-10-CM | POA: Diagnosis not present

## 2023-10-05 ENCOUNTER — Ambulatory Visit (HOSPITAL_COMMUNITY)

## 2023-10-05 ENCOUNTER — Encounter (HOSPITAL_COMMUNITY): Payer: Self-pay

## 2023-10-05 DIAGNOSIS — Z7409 Other reduced mobility: Secondary | ICD-10-CM | POA: Diagnosis not present

## 2023-10-05 DIAGNOSIS — M6281 Muscle weakness (generalized): Secondary | ICD-10-CM

## 2023-10-05 DIAGNOSIS — G834 Cauda equina syndrome: Secondary | ICD-10-CM

## 2023-10-05 NOTE — Therapy (Signed)
 OUTPATIENT PHYSICAL THERAPY THORACOLUMBAR TREATMENT   Patient Name: Austin Bright MRN: [id number] DOB:[birth year], 78 y.o., male Today's Date: 10/05/2023  END OF SESSION:  PT End of Session - 10/05/23 1301     Visit Number 2    Number of Visits 16    Date for PT Re-Evaluation 10/29/23    Authorization Type [employer]    Authorization Time Period cohere covered 13 visits from 10/01/23-12/30/2023    Authorization - Visit Number 2    Authorization - Number of Visits 13    Progress Note Due on Visit 10    PT Start Time 1303    PT Stop Time 1344    PT Time Calculation (min) 41 min    Equipment Utilized During Treatment Back brace    Activity Tolerance Patient tolerated treatment well    Behavior During Therapy [facility] for tasks assessed/performed          Past Medical History:  Diagnosis Date   Acute diverticulitis 08/17/2020   Acute MI (HCC) 08/2011   Arthritis    Bilateral pneumonia    Diagnosed after STEMI 08/2011   CAD (coronary artery disease)    a. Diagnosed 08/2011 with anterior STEMI due to thrombotic occlusion of mid LAD s/p thrombectomy, PTCA, DES placement 08/23/11.   CHF (congestive heart failure) (HCC)    Chronic combined systolic and diastolic congestive heart failure (HCC) 09/15/2011   Dyslipidemia    Gout    Hypertension    Ischemic cardiomyopathy    a. Initial EF 35% by cath 08/23/11, improved to 40-45% by echo 08/25/11. 50-55% by echo November 2013.   Rotator cuff tear arthropathy, right    Shortness of breath    Past Surgical History:  Procedure Laterality Date   carpel tunnel Bilateral 2002   COLONOSCOPY WITH PROPOFOL  N/A 04/08/2017   Surgeon: [name], MD;  diverticulosis in sigmoid and descending colon, internal hemorrhoids, otherwise normal exam.  No recommendations to repeat due to age.   COLONOSCOPY WITH PROPOFOL  N/A 03/17/2021   Procedure: COLONOSCOPY WITH PROPOFOL ;  Surgeon: [name], DO;  Location: [facility];  Service:  Endoscopy;  Laterality: N/A;  8:00am   CORONARY STENT PLACEMENT     CYSTECTOMY  1969   pilonidal cyst   LEFT AND RIGHT HEART CATHETERIZATION WITH CORONARY ANGIOGRAM N/A 09/15/2011   Procedure: LEFT AND RIGHT HEART CATHETERIZATION WITH CORONARY ANGIOGRAM;  Surgeon: [name], MD;  Location: [facility] CATH LAB;  Service: Cardiovascular;  Laterality: N/A;   LEFT HEART CATH N/A 08/23/2011   Procedure: LEFT HEART CATH;  Surgeon: [name], MD;  Location: [facility] CATH LAB;  Service: Cardiovascular;  Laterality: N/A;   NO PAST SURGERIES     PERCUTANEOUS CORONARY STENT INTERVENTION (PCI-S)  08/23/2011   Procedure: PERCUTANEOUS CORONARY STENT INTERVENTION (PCI-S);  Surgeon: [name], MD;  Location: [facility] CATH LAB;  Service: Cardiovascular;;   Patient Active Problem List   Diagnosis Date Noted   Abnormal LFTs 09/26/2023   Cauda equina compression (HCC) 09/14/2023   Cauda equina syndrome with neurogenic bladder (HCC) 09/12/2023   Spinal stenosis 09/11/2023   Tendinopathy of right rotator cuff 02/03/2023   Mixed hyperlipidemia 01/23/2022   Encounter for general adult medical examination with abnormal findings 07/18/2021   Prediabetes 07/18/2021   Vitamin D  deficiency 07/18/2021   History of diverticulitis 02/24/2021   Pancreatic lesion 02/24/2021   Prostate cancer screening 01/30/2021   Primary insomnia 01/27/2021   Hepatomegaly    Essential  hypertension 08/18/2020   Idiopathic gout 11/16/2018   Neck mass 12/31/2015   Old anteroseptal myocardial infarction 08/24/2011   Coronary artery disease involving native coronary artery of native heart without angina pectoris 08/24/2011    PCP: [name], MD  REFERRING PROVIDER: Maurice Sharlet RAMAN, PA-C  REFERRING DIAG:  G83.4 (ICD-10-CM) - Cauda equina compression [facility])    Rationale for Evaluation and Treatment: Rehabilitation  THERAPY DIAG:  Muscle weakness  Impaired functional mobility, balance, gait, and endurance  Cauda equina  compression [facility])  ONSET DATE: September 09, 2023  SUBJECTIVE:                                                                                                                                                                                           SUBJECTIVE STATEMENT: Pt reports no pain today. Reports HEP went well at home; no questions or concerns. Sent a message to MD this morning about brace wear and BLT precautions, awaiting response. Reports he's been doing a lot of walking since last visit.   EVAL: Pt reports he was pain free until August 21st. Reports he woke up the next day with low back and LE pain. Reports he went to ER but they sent him home. N/T started a few days after that and he went back to ER, they sent him to [facility] and he had surgery the next day. Reports he had inpatient rehab for 1 week post-op. Pt reports he had a follow up this morning. Doctor pleased with progress.   PERTINENT HISTORY:  Lumbar decompression, fusion, L4-5 on 8/23 R shoulder Rotator cuff pain  PAIN:  Are you having pain? No  PRECAUTIONS: Back, Pt wearing back brace, Reports under BLT precautions (bending, lifting, twisting)  - Plans to follow up with MD regarding timeline of precautions   RED FLAGS: Reports cont N/T down both LE, most in buttocks    WEIGHT BEARING RESTRICTIONS: No  FALLS:  Has patient fallen in last 6 months? No  LIVING ENVIRONMENT: Lives with: lives with their spouse Lives in: House/apartment Stairs: Yes: Internal: 13 steps; can reach both and External: 2 steps; none Has following equipment at home: Vannie - 2 wheeled and Shower bench, Back brace  OCCUPATION: N/A  PLOF: Reports bathing at sink, independent with dressing, reports wife doing all cleaning now, can prepare own meals   PATIENT GOALS: For 100% recovery, mobile, active, golf (last played last year)  NEXT MD VISIT: October 17th  OBJECTIVE:  Note: Objective measures were completed at Evaluation unless otherwise  noted.  DIAGNOSTIC FINDINGS:  IMPRESSION: 1. Bulky disc extrusion at L4-L5 with very Severe spinal  stenosis. Lateral recess stenosis may be greater on the left. Query L5 radiculitis. 2. Superimposed multifactorial Severe spinal stenosis also at L3-L4, and moderate spinal stenosis at L2-L3. Up to moderate bilateral L2 neural foraminal stenosis.  IMPRESSION: Intraoperative L4-L5 posterolateral and interbody surgical hardware.    PATIENT SURVEYS:  Modified Oswestry: Modified Oswestry Low Back Pain Disability Questionnaire: 20 / 50 = 40.0 %  Interpretation of scores: Score Category Description  0-20% Minimal Disability The patient can cope with most living activities. Usually no treatment is indicated apart from advice on lifting, sitting and exercise  21-40% Moderate Disability The patient experiences more pain and difficulty with sitting, lifting and standing. Travel and social life are more difficult and they may be disabled from work. Personal care, sexual activity and sleeping are not grossly affected, and the patient can usually be managed by conservative means  41-60% Severe Disability Pain remains the main problem in this group, but activities of daily living are affected. These patients require a detailed investigation  61-80% Crippled Back pain impinges on all aspects of the patient's life. Positive intervention is required  81-100% Bed-bound  These patients are either bed-bound or exaggerating their symptoms  Bluford FORBES Zoe DELENA Karon DELENA, et al. Surgery versus conservative management of stable thoracolumbar fracture: the PRESTO feasibility RCT. [place]): [employer]; 2021 Nov. [facility] Technology Assessment, No. 25.62.) Appendix 3, Oswestry Disability Index category descriptors. Available from: FindJewel[employer].cz  Minimally Clinically Important Difference (MCID) = 12.8% LEFS  Extreme difficulty/unable (0), Quite a bit of difficulty  (1), Moderate difficulty (2), Little difficulty (3), No difficulty (4) Survey date:    Any of your usual work, housework or school activities   2. Usual hobbies, recreational or sporting activities   3. Getting into/out of the bath   4. Walking between rooms   5. Putting on socks/shoes   6. Squatting    7. Lifting an object, like a bag of groceries from the floor   8. Performing light activities around your home   9. Performing heavy activities around your home   10. Getting into/out of a car   11. Walking 2 blocks   12. Walking 1 mile   13. Going up/down 10 stairs (1 flight)   14. Standing for 1 hour   15.  sitting for 1 hour   16. Running on even ground   17. Running on uneven ground   18. Making sharp turns while running fast   19. Hopping    20. Rolling over in bed   Score total:  Lower Extremity Functional Score: 45 / 80 = 56.3 %      COGNITION: Overall cognitive status: Within functional limits for tasks assessed     SENSATION: WFL,   POSTURE: rounded shoulders and forward head  PALPATION:  N/A  LUMBAR ROM:   AROM eval  Flexion   Extension   Right lateral flexion   Left lateral flexion   Right rotation   Left rotation    (Blank rows = not tested)  LOWER EXTREMITY ROM:     Active  Right eval Left eval  Hip flexion    Hip extension    Hip abduction    Hip adduction    Hip internal rotation    Hip external rotation    Knee flexion    Knee extension    Ankle dorsiflexion    Ankle plantarflexion    Ankle inversion    Ankle eversion     (Blank rows =  not tested)  LOWER EXTREMITY MMT:    MMT Right eval Left eval  Hip flexion 4 4  Hip extension 2+ 2+  Hip abduction 3 3-  Hip adduction    Hip internal rotation    Hip external rotation    Knee flexion    Knee extension    Ankle dorsiflexion 5 5  Ankle plantarflexion    Ankle inversion    Ankle eversion     (Blank rows = not tested)  LUMBAR SPECIAL TESTS:    FUNCTIONAL TESTS:  30  seconds chair stand test 2 minute walk test: 321 ft on 10/05/23 30 second chair stand: 6 STS, no UE use  Tandem balance: 26 with RLE leading, 25 with LLE leading SLS: unable with BLE  GAIT: Distance walked: 100 ft in session  Assistive device utilized: Walker - 2 wheeled Level of assistance: Modified independence Comments: Dec speed, ambulates with RW, back brace donned, slight forward posture,   TREATMENT DATE:  10/05/23: Recumbent bike, seat 10, level 2, 5' Review of goals 2 Minute Walk Test Review HEP: LAQ, 10x each LE Sit to stand, 20 inch height, 10x Clamshells, 10x each side Bridge, 10x Tandem Balance, 30  Heel raises, 2x10, in // bars Toe raises on decline, 2x10 Hip Vectors, 3 holds, 5x   10/01/23: PT Eval and HEP                                                                                                                            PATIENT EDUCATION:  Education details: PT evaluation, objective findings, POC, Importance of HEP, Precautions, Clinic policies  Person educated: Patient Education method: Explanation and Demonstration Education comprehension: verbalized understanding and returned demonstration  HOME EXERCISE PROGRAM: Access Code: [email or web address] URL: [email or web address] Date: 10/01/2023 Prepared by: Rosaria Powell-Butler  Exercises - Clamshell  - 2 x daily - 7 x weekly - 3 sets - 10 reps - Supine Bridge  - 2 x daily - 7 x weekly - 3 sets - 10 reps - Seated Long Arc Quad  - 2 x daily - 7 x weekly - 3 sets - 10 reps - Sit to Stand  - 2 x daily - 7 x weekly - 3 sets - 10 reps - Standing Tandem Balance with Counter Support  - 2 x daily - 7 x weekly - 3 sets - 10 reps - 30 hold   ASSESSMENT:  CLINICAL IMPRESSION: Began session on recumbent bike for dynamic warm up while reviewing goals. Pt able to use LE only for last minute. Tolerated well but needed seated rest break prior to next activity. Followed with 2 minute walk test. Patient  required seated rest due to fatigue. Review of HEP. Pt requiring verbal cueing only during clamshells for form. Continued rest breaks needed between each exercise for general LE fatigue. During heel/toe raises pt demo increased fatigue and shakiness throughout. Patient will benefit from continued skilled physical therapy in order to address LE strength,  endurance, and balance, in order to improve function and quality of life.      EVAL:  Patient is a 78 y.o. male who was seen today for physical therapy evaluation and treatment for  G83.4 (ICD-10-CM) - Cauda equina compression (HCC)  . On this date, patient demonstrates decreased self perceived function via Modified oswestry as well as LEFS, decreased LE strength, impaired balance, altered gait, and decreased activity tolerance all which may be contributing to patients impaired overall function. Patient reports he's maintaining his no lifting,bending, twisting precautions and wearing brace when OOB. Will follow up with surgeon regarding timeline of precautions and brace wear. Patient will benefit from continued skilled physical therapy in order to address the above/below to improve current level of function for better quality of life.   OBJECTIVE IMPAIRMENTS: Abnormal gait, decreased activity tolerance, decreased balance, decreased endurance, decreased mobility, difficulty walking, decreased ROM, decreased strength, and postural dysfunction.   ACTIVITY LIMITATIONS: lifting, bending, standing, squatting, sleeping, stairs, and transfers  PARTICIPATION LIMITATIONS: meal prep, cleaning, laundry, driving, community activity, and yard work  PERSONAL FACTORS: N/A are also affecting patient's functional outcome.   REHAB POTENTIAL: Good  CLINICAL DECISION MAKING: Stable/uncomplicated  EVALUATION COMPLEXITY: Low   GOALS: Goals reviewed with patient? Yes  SHORT TERM GOALS: Target date: 10/22/23 Patient will be independent with performance of HEP to  demonstrate adequate self management of symptoms.  Baseline:  Goal status: INITIAL  2.   Patient will report at least a 25% improvement with function and/or pain reduction overall since beginning PT. Baseline:  Goal status: INITIAL   LONG TERM GOALS: Target date: 11/11/23 Patient will improve LEFS score by 9 points to demonstrate improved perceived function while meeting MCID.  Baseline: Goal status: INITIAL Patient will improve Modified Oswestry score by 12.8% to demonstrate improved perceived function while meeting MCID.  Baseline: Goal status: INITIAL 3.  Patient will score at least a  4/5  on hip extension and abduction MMT with BLE to demonstrate increased LE strength and/or power needed to improve ambulation/gait mechanics.  Baseline:  Goal status: INITIAL   4.  Patient will increase 2 minute walk test gait distance by at least 40 ft with least restrictive assistive device to demonstrate improved endurance and functional mobility needed for ambulating household and community distances.  Baseline: Goal status: INITIAL 5. Patient will be able to maintain SL balance on each LE for at least 5 to demonstrate improved balance needed to reduce risk of falls.  Baseline: Goal status: INITIAL   PLAN:  PT FREQUENCY: 2x/week  PT DURATION: 8 weeks  PLANNED INTERVENTIONS: 97164- PT Re-evaluation, 97110-Therapeutic exercises, 97530- Therapeutic activity, [id number]- Neuromuscular re-education, 97535- Self Care, [id number]- Manual therapy, [id number]- Gait training, [phone]- Electrical stimulation (manual), [id number]- Traction (mechanical), [phone] (1-2 muscles), [id number] (3+ muscles)- Dry Needling, Patient/Family education, Balance training, Stair training, Taping, Joint mobilization, Spinal mobilization, Cryotherapy, and Moist heat.  PLAN FOR NEXT SESSION: progress LE strengthening, balance, reduce AD as appropriate   1:47 PM, 10/05/23 Jovaun Levene Powell-Butler, PT, DPT [facility]

## 2023-10-07 ENCOUNTER — Ambulatory Visit: Admitting: Internal Medicine

## 2023-10-07 ENCOUNTER — Encounter: Payer: Self-pay | Admitting: Internal Medicine

## 2023-10-07 ENCOUNTER — Other Ambulatory Visit: Payer: Self-pay

## 2023-10-07 VITALS — BP 133/79 | HR 73 | Ht 70.0 in | Wt 214.2 lb

## 2023-10-07 DIAGNOSIS — G834 Cauda equina syndrome: Secondary | ICD-10-CM

## 2023-10-07 DIAGNOSIS — Z09 Encounter for follow-up examination after completed treatment for conditions other than malignant neoplasm: Secondary | ICD-10-CM | POA: Insufficient documentation

## 2023-10-07 DIAGNOSIS — I10 Essential (primary) hypertension: Secondary | ICD-10-CM

## 2023-10-07 DIAGNOSIS — L0293 Carbuncle, unspecified: Secondary | ICD-10-CM | POA: Insufficient documentation

## 2023-10-07 DIAGNOSIS — I2510 Atherosclerotic heart disease of native coronary artery without angina pectoris: Secondary | ICD-10-CM

## 2023-10-07 DIAGNOSIS — M48062 Spinal stenosis, lumbar region with neurogenic claudication: Secondary | ICD-10-CM

## 2023-10-07 MED ORDER — LISINOPRIL 20 MG PO TABS: 20.0000 mg | ORAL_TABLET | Freq: Every day | ORAL | 3 refills | Status: AC

## 2023-10-07 MED ORDER — SULFAMETHOXAZOLE-TRIMETHOPRIM 800-160 MG PO TABS: 1.0000 | ORAL_TABLET | Freq: Two times a day (BID) | ORAL | 0 refills | Status: DC

## 2023-10-07 MED ORDER — LISINOPRIL 40 MG PO TABS: 40.0000 mg | ORAL_TABLET | Freq: Every day | ORAL | 3 refills | Status: DC

## 2023-10-07 NOTE — Patient Instructions (Addendum)
 Please start taking Bactrim  as prescribed.  Please continue taking Lisinopril  40 mg once daily.  Okay to restart Wegovy  0.5 mg once weekly.  Please continue to take medications as prescribed.  Please continue to follow low carb diet and perform moderate exercise/walking at least 150 mins/week.

## 2023-10-07 NOTE — Assessment & Plan Note (Signed)
 Hospital chart reviewed, including discharge summary Medications reconciled and reviewed with the patient in detail

## 2023-10-07 NOTE — Assessment & Plan Note (Signed)
 Likely has a carbuncle near left knee Since it has already started draining, will empirically start Bactrim  Advised to contact if infection does not improve in the next week, may need General Surgery referral for I&D

## 2023-10-07 NOTE — Assessment & Plan Note (Addendum)
 BP Readings from Last 1 Encounters:  10/07/23 133/79   Well-controlled with Lisinopril  20 mg QD and Metoprolol  25 mg QD Chlorthalidone  has been discontinued due to episode of hypotension during recent hospitalization Counseled for compliance with the medications Advised DASH diet and moderate exercise/walking, at least 150 mins/week

## 2023-10-07 NOTE — Progress Notes (Signed)
 Established Patient Office Visit  Subjective:  Patient ID: Austin Bright, male    DOB: [birth year]  Age: 78 y.o. MRN: [id number]  CC:  Chief Complaint  Patient presents with   Follow-up    TOC f/u , reports feeling better.    Skin Problem    Reports rash on his left inner knee , first noticed it 1 week ago, has been growing in size.     HPI Austin Bright is a 78 y.o. male with past medical history of CAD s/p stent placement, HTN, HLD, gout and diverticulitis who presents for follow-up after recent hospitalization.  He was admitted for concern for cauda equina syndrome. Patient underwent an L4-5 decompression and fusion by Dr. Mavis on 09/11/2023. He was later discharged to [facility] for PT. He is participating in PT in outpatient setting now.  He reports significant improvement in his back pain, has intermittent discomfort in the left upper thigh area upon long sitting or lying down, but is getting better with stretching exercises.  Does not report saddle anesthesia, urinary or stool incontinence.  He reports redness and swelling over medial side of the left knee for the last 1 week.  He noticed it as a pimple initially after pulling possible ingrown hair, and has been increasing in size.  He has noticed serosanguineous discharge at times.  Denies any fever or chills.  His BP is WNL.  His dose of lisinopril  was reduced to 5 mg during inpatient rehab due to hypotensive spell.  He has been taking lisinopril  20 mg QD since returning to home.  Denies any headache, dizziness, chest pain, dyspnea or palpitations.  Past Medical History:  Diagnosis Date   Acute diverticulitis 08/17/2020   Acute MI (HCC) 08/2011   Arthritis    Bilateral pneumonia    Diagnosed after STEMI 08/2011   CAD (coronary artery disease)    a. Diagnosed 08/2011 with anterior STEMI due to thrombotic occlusion of mid LAD s/p thrombectomy, PTCA, DES placement 08/23/11.   CHF (congestive heart  failure) (HCC)    Chronic combined systolic and diastolic congestive heart failure (HCC) 09/15/2011   Dyslipidemia    Gout    Hypertension    Ischemic cardiomyopathy    a. Initial EF 35% by cath 08/23/11, improved to 40-45% by echo 08/25/11. 50-55% by echo November 2013.   Rotator cuff tear arthropathy, right    Shortness of breath     Past Surgical History:  Procedure Laterality Date   carpel tunnel Bilateral 2002   COLONOSCOPY WITH PROPOFOL  N/A 04/08/2017   Surgeon: [name], MD;  diverticulosis in sigmoid and descending colon, internal hemorrhoids, otherwise normal exam.  No recommendations to repeat due to age.   COLONOSCOPY WITH PROPOFOL  N/A 03/17/2021   Procedure: COLONOSCOPY WITH PROPOFOL ;  Surgeon: [name], DO;  Location: [facility];  Service: Endoscopy;  Laterality: N/A;  8:00am   CORONARY STENT PLACEMENT     CYSTECTOMY  1969   pilonidal cyst   LEFT AND RIGHT HEART CATHETERIZATION WITH CORONARY ANGIOGRAM N/A 09/15/2011   Procedure: LEFT AND RIGHT HEART CATHETERIZATION WITH CORONARY ANGIOGRAM;  Surgeon: [name], MD;  Location: [facility] CATH LAB;  Service: Cardiovascular;  Laterality: N/A;   LEFT HEART CATH N/A 08/23/2011   Procedure: LEFT HEART CATH;  Surgeon: [name], MD;  Location: [facility] CATH LAB;  Service: Cardiovascular;  Laterality: N/A;   NO PAST SURGERIES     PERCUTANEOUS CORONARY  STENT INTERVENTION (PCI-S)  08/23/2011   Procedure: PERCUTANEOUS CORONARY STENT INTERVENTION (PCI-S);  Surgeon: [name], MD;  Location: [facility] CATH LAB;  Service: Cardiovascular;;    Family History  Problem Relation Age of Onset   Stroke Mother    Hypertension Father    Colon cancer Neg Hx    Pancreatic cancer Neg Hx     Social History   Socioeconomic History   Marital status: Married    Spouse name: Not on file   Number of children: Not on file   Years of education: Not on file   Highest education level: Master's degree (e.g., MA, MS, MEng, MEd, MSW,  MBA)  Occupational History   Occupation: Child psychotherapist  Tobacco Use   Smoking status: Former    Current packs/day: 0.00    Average packs/day: 1 pack/day for 25.0 years (25.0 ttl pk-yrs)    Types: Cigarettes    Start date: 08/23/1954    Quit date: 08/23/1979    Years since quitting: 44.1    Passive exposure: Past   Smokeless tobacco: Never  Vaping Use   Vaping status: Some Days   Substances: Nicotine  Substance and Sexual Activity   Alcohol use: Yes    Alcohol/week: 4.0 standard drinks of alcohol    Types: 2 Cans of beer, 2 Shots of liquor per week    Comment: 1-2 drinks on the weekends   Drug use: No   Sexual activity: Yes  Other Topics Concern   Not on file  Social History Narrative   Widower since 6,married for 10 years.Lives alone,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      11/09/2022 Remarried. Wifes name is [name].    Social Drivers of Corporate investment banker Strain: Low Risk  (07/30/2023)   Overall Financial Resource Strain (CARDIA)    Difficulty of Paying Living Expenses: Not hard at all  Food Insecurity: No Food Insecurity (09/11/2023)   Hunger Vital Sign    Worried About Running Out of Food in the Last Year: Never true    Ran Out of Food in the Last Year: Never true  Transportation Needs: No Transportation Needs (09/11/2023)   PRAPARE - Administrator, Civil Service (Medical): No    Lack of Transportation (Non-Medical): No  Physical Activity: Insufficiently Active (07/30/2023)   Exercise Vital Sign    Days of Exercise per Week: 3 days    Minutes of Exercise per Session: 20 min  Stress: No Stress Concern Present (07/30/2023)   [employer] of Occupational Health - Occupational Stress Questionnaire    Feeling of Stress: Not at all  Social Connections: Moderately Integrated (09/11/2023)   Social Connection and Isolation Panel    Frequency of Communication with Friends and Family: Twice a week    Frequency  of Social Gatherings with Friends and Family: Once a week    Attends Religious Services: Never    Database administrator or Organizations: Yes    Attends Banker Meetings: 1 to 4 times per year    Marital Status: Married  Catering manager Violence: Not At Risk (09/11/2023)   Humiliation, Afraid, Rape, and Kick questionnaire    Fear of Current or Ex-Partner: No    Emotionally Abused: No    Physically Abused: No    Sexually Abused: No    Outpatient Medications Prior to Visit  Medication Sig Dispense Refill   ascorbic acid  (VITAMIN C ) 1000 MG tablet Take 1 tablet (1,000 mg  total) by mouth daily after supper.     aspirin  EC 81 MG tablet Take 81 mg by mouth daily.     Cyanocobalamin  (VITAMIN B-12 PO) Take 1 tablet by mouth at bedtime.     ezetimibe  (ZETIA ) 10 MG tablet TAKE ONE TABLET BY MOUTH EVERY DAY 90 tablet 3   methocarbamol  (ROBAXIN ) 750 MG tablet Take 1 tablet (750 mg total) by mouth 4 (four) times daily. 120 tablet 0   metoprolol  succinate (TOPROL -XL) 25 MG 24 hr tablet TAKE 1 TABLET BY MOUTH ONCE DAILY. (Patient taking differently: Take 25 mg by mouth at bedtime.) 90 tablet 3   nitroGLYCERIN  (NITROSTAT ) 0.4 MG SL tablet PLACE 1 TAB UNDER TONGUE EVERY 5 MIN IF NEEDED FOR CHEST PAIN. MAY USE 3 TIMES.NO RELIEF CALL 911. 25 tablet 3   oxyCODONE  (OXY IR/ROXICODONE ) 5 MG immediate release tablet Take 1-2 tablets (5-10 mg total) by mouth 3 (three) times daily as needed for severe pain (pain score 7-10). 40 tablet 0   rosuvastatin  (CRESTOR ) 40 MG tablet Take 1 tablet (40 mg total) by mouth daily. (Patient taking differently: Take 40 mg by mouth at bedtime.) 90 tablet 3   semaglutide -weight management (WEGOVY ) 0.5 MG/0.5ML SOAJ SQ injection Inject 0.5 mg into the skin every 7 (seven) days. (Patient taking differently: Inject 0.5 mg into the skin every Saturday.) 2 mL 0   tamsulosin  (FLOMAX ) 0.4 MG CAPS capsule Take 1 capsule (0.4 mg total) by mouth daily after breakfast. 30 capsule 0    traZODone  (DESYREL ) 50 MG tablet Take 0.5-1 tablets (25-50 mg total) by mouth at bedtime as needed for sleep. 30 tablet 3   lisinopril  (ZESTRIL ) 5 MG tablet Take 1 tablet (5 mg total) by mouth daily. 30 tablet 0   acetaminophen  (TYLENOL ) 325 MG tablet Take 2 tablets (650 mg total) by mouth every 4 (four) hours as needed for mild pain (pain score 1-3).     polyethylene glycol (MIRALAX  / GLYCOLAX ) 17 g packet Take 17 g by mouth daily. 30 each 0   No facility-administered medications prior to visit.    Allergies  Allergen Reactions   Flonase  [Fluticasone  Propionate] Other (See Comments)    Epistaxis     ROS Review of Systems  Constitutional:  Negative for chills and fever.  HENT:  Negative for congestion and sore throat.   Eyes:  Negative for pain and discharge.  Respiratory:  Negative for cough and shortness of breath.   Cardiovascular:  Negative for chest pain and palpitations.  Gastrointestinal:  Negative for diarrhea, nausea and vomiting.  Endocrine: Negative for polydipsia and polyuria.  Genitourinary:  Negative for dysuria and hematuria.  Musculoskeletal:  Negative for neck pain and neck stiffness.  Skin:  Positive for color change. Negative for rash.  Neurological:  Negative for dizziness, weakness, numbness and headaches.  Psychiatric/Behavioral:  Negative for agitation and behavioral problems.       Objective:    Physical Exam Vitals reviewed.  Constitutional:      General: He is not in acute distress.    Appearance: He is not diaphoretic.     Comments: Has a rolling walker  HENT:     Head: Normocephalic and atraumatic.     Nose: Nose normal.     Mouth/Throat:     Mouth: Mucous membranes are moist.  Eyes:     General: No scleral icterus.    Extraocular Movements: Extraocular movements intact.  Cardiovascular:     Rate and Rhythm: Normal rate and regular rhythm.  Heart sounds: Normal heart sounds. No murmur heard. Pulmonary:     Breath sounds: Normal  breath sounds. No wheezing or rales.  Musculoskeletal:     Right shoulder: No tenderness. Normal range of motion.     Right upper arm: Tenderness present.     Cervical back: Neck supple. No tenderness.     Right lower leg: No edema.     Left lower leg: No edema.  Skin:    General: Skin is warm.     Findings: Erythema (About 2 cm in diameter carbuncle with surrounding erythema over medial side of the left knee) present. No rash.     Comments: Incision site - C/D/I  Neurological:     General: No focal deficit present.     Mental Status: He is alert and oriented to person, place, and time.     Sensory: No sensory deficit.     Motor: No weakness.  Psychiatric:        Mood and Affect: Mood normal.        Behavior: Behavior normal.     BP 133/79   Pulse 73   Ht 5' 10 (1.778 m)   Wt 214 lb 3.2 oz (97.2 kg)   SpO2 95%   BMI 30.73 kg/m  Wt Readings from Last 3 Encounters:  10/07/23 214 lb 3.2 oz (97.2 kg)  09/23/23 207 lb 7.3 oz (94.1 kg)  09/11/23 208 lb (94.3 kg)    Lab Results  Component Value Date   TSH 1.140 08/02/2023   Lab Results  Component Value Date   WBC 8.1 09/23/2023   HGB 13.0 09/23/2023   HCT 36.9 (L) 09/23/2023   MCV 91.6 09/23/2023   PLT 182 09/23/2023   Lab Results  Component Value Date   NA 138 09/23/2023   K 4.2 09/23/2023   CO2 26 09/23/2023   GLUCOSE 133 (H) 09/23/2023   BUN 15 09/23/2023   CREATININE 1.15 09/23/2023   BILITOT 0.9 09/23/2023   ALKPHOS 57 09/23/2023   AST 54 (H) 09/23/2023   ALT 90 (H) 09/23/2023   PROT 6.0 (L) 09/23/2023   ALBUMIN 3.1 (L) 09/23/2023   CALCIUM  8.9 09/23/2023   ANIONGAP 13 09/23/2023   EGFR 61 08/02/2023   Lab Results  Component Value Date   CHOL 117 08/02/2023   Lab Results  Component Value Date   HDL 47 08/02/2023   Lab Results  Component Value Date   LDLCALC 52 08/02/2023   Lab Results  Component Value Date   TRIG 95 08/02/2023   Lab Results  Component Value Date   CHOLHDL 2.5  08/02/2023   Lab Results  Component Value Date   HGBA1C 6.2 (H) 08/02/2023      Assessment & Plan:   Problem List Items Addressed This Visit       Cardiovascular and Mediastinum   Coronary artery disease involving native coronary artery of native heart without angina pectoris   S/p stent placement On Aspirin , statin and Zetia  On Metoprolol  and Lisinopril  Had ischemic CM, but LVEF has improved now. Followed by Cardiology  Due to his history of CAD, HTN and obesity, he would benefit from GLP-1 agonist therapy to reduce his cardiovascular risk - had started Wegovy , plan to increase dose as tolerated - he can restart now      Relevant Medications   lisinopril  (ZESTRIL ) 20 MG tablet   Essential hypertension   BP Readings from Last 1 Encounters:  10/07/23 133/79   Well-controlled with Lisinopril  20  mg QD and Metoprolol  25 mg QD Chlorthalidone  has been discontinued due to episode of hypotension during recent hospitalization Counseled for compliance with the medications Advised DASH diet and moderate exercise/walking, at least 150 mins/week      Relevant Medications   lisinopril  (ZESTRIL ) 20 MG tablet   Other Relevant Orders   CBC with Differential/Platelet   CMP14+EGFR     Nervous and Auditory   Cauda equina compression (HCC) - Primary   Had emergent L4-L5 decompression and fusion surgery due to cauda equina Followed by neurosurgery, cauda equina symptoms have resolved now Undergoing PT        Other   Lumbar spinal stenosis   Had L4-L5 decompression and fusion surgery due to cauda equina syndrome Followed by neurosurgery Undergoing PT Tylenol  as needed for pain Has oxycodone  as needed for severe pain, but has not required it      Carbuncle   Likely has a carbuncle near left knee Since it has already started draining, will empirically start Bactrim  Advised to contact if infection does not improve in the next week, may need General Surgery referral for I&D       Relevant Medications   sulfamethoxazole -trimethoprim  (BACTRIM  DS) 800-160 MG tablet   Hospital discharge follow-up   Hospital chart reviewed, including discharge summary Medications reconciled and reviewed with the patient in detail       Meds ordered this encounter  Medications   sulfamethoxazole -trimethoprim  (BACTRIM  DS) 800-160 MG tablet    Sig: Take 1 tablet by mouth 2 (two) times daily.    Dispense:  14 tablet    Refill:  0   DISCONTD: lisinopril  (ZESTRIL ) 40 MG tablet    Sig: Take 1 tablet (40 mg total) by mouth daily.    Dispense:  90 tablet    Refill:  3   lisinopril  (ZESTRIL ) 20 MG tablet    Sig: Take 1 tablet (20 mg total) by mouth daily.    Dispense:  90 tablet    Refill:  3    Please cancel 40 mg prescription. Thank you!    Follow-up: Return in about 4 months (around 02/06/2024).    Suzzane MARLA Blanch, MD

## 2023-10-07 NOTE — Assessment & Plan Note (Signed)
 Had L4-L5 decompression and fusion surgery due to cauda equina syndrome Followed by neurosurgery Undergoing PT Tylenol  as needed for pain Has oxycodone  as needed for severe pain, but has not required it

## 2023-10-07 NOTE — Assessment & Plan Note (Signed)
 S/p stent placement On Aspirin , statin and Zetia  On Metoprolol  and Lisinopril  Had ischemic CM, but LVEF has improved now. Followed by Cardiology  Due to his history of CAD, HTN and obesity, he would benefit from GLP-1 agonist therapy to reduce his cardiovascular risk - had started Wegovy , plan to increase dose as tolerated - he can restart now

## 2023-10-07 NOTE — Assessment & Plan Note (Signed)
 Had emergent L4-L5 decompression and fusion surgery due to cauda equina Followed by neurosurgery, cauda equina symptoms have resolved now Undergoing PT

## 2023-10-08 ENCOUNTER — Encounter (HOSPITAL_COMMUNITY): Payer: Self-pay

## 2023-10-08 ENCOUNTER — Ambulatory Visit (HOSPITAL_COMMUNITY)

## 2023-10-08 DIAGNOSIS — Z7409 Other reduced mobility: Secondary | ICD-10-CM

## 2023-10-08 DIAGNOSIS — M6281 Muscle weakness (generalized): Secondary | ICD-10-CM | POA: Diagnosis not present

## 2023-10-08 DIAGNOSIS — G834 Cauda equina syndrome: Secondary | ICD-10-CM

## 2023-10-08 NOTE — Therapy (Signed)
 OUTPATIENT PHYSICAL THERAPY THORACOLUMBAR TREATMENT   Patient Name: Austin Bright MRN: [id number] DOB:[birth year], 78 y.o., male Today's Date: 10/08/2023  END OF SESSION:  PT End of Session - 10/08/23 1258     Visit Number 3    Number of Visits 16    Date for Recertification  10/29/23    Authorization Type [employer]    Authorization Time Period cohere covered 13 visits from 10/01/23-12/30/2023    Authorization - Visit Number 3    Authorization - Number of Visits 13    Progress Note Due on Visit 10    PT Start Time 1259    PT Stop Time 1338    PT Time Calculation (min) 39 min    Equipment Utilized During Treatment Back brace    Activity Tolerance Patient tolerated treatment well    Behavior During Therapy [facility] for tasks assessed/performed           Past Medical History:  Diagnosis Date   Acute diverticulitis 08/17/2020   Acute MI (HCC) 08/2011   Arthritis    Bilateral pneumonia    Diagnosed after STEMI 08/2011   CAD (coronary artery disease)    a. Diagnosed 08/2011 with anterior STEMI due to thrombotic occlusion of mid LAD s/p thrombectomy, PTCA, DES placement 08/23/11.   CHF (congestive heart failure) (HCC)    Chronic combined systolic and diastolic congestive heart failure (HCC) 09/15/2011   Dyslipidemia    Gout    Hypertension    Ischemic cardiomyopathy    a. Initial EF 35% by cath 08/23/11, improved to 40-45% by echo 08/25/11. 50-55% by echo November 2013.   Rotator cuff tear arthropathy, right    Shortness of breath    Past Surgical History:  Procedure Laterality Date   carpel tunnel Bilateral 2002   COLONOSCOPY WITH PROPOFOL  N/A 04/08/2017   Surgeon: [name], MD;  diverticulosis in sigmoid and descending colon, internal hemorrhoids, otherwise normal exam.  No recommendations to repeat due to age.   COLONOSCOPY WITH PROPOFOL  N/A 03/17/2021   Procedure: COLONOSCOPY WITH PROPOFOL ;  Surgeon: [name], DO;  Location: [facility];  Service:  Endoscopy;  Laterality: N/A;  8:00am   CORONARY STENT PLACEMENT     CYSTECTOMY  1969   pilonidal cyst   LEFT AND RIGHT HEART CATHETERIZATION WITH CORONARY ANGIOGRAM N/A 09/15/2011   Procedure: LEFT AND RIGHT HEART CATHETERIZATION WITH CORONARY ANGIOGRAM;  Surgeon: [name], MD;  Location: [facility] CATH LAB;  Service: Cardiovascular;  Laterality: N/A;   LEFT HEART CATH N/A 08/23/2011   Procedure: LEFT HEART CATH;  Surgeon: [name], MD;  Location: [facility] CATH LAB;  Service: Cardiovascular;  Laterality: N/A;   NO PAST SURGERIES     PERCUTANEOUS CORONARY STENT INTERVENTION (PCI-S)  08/23/2011   Procedure: PERCUTANEOUS CORONARY STENT INTERVENTION (PCI-S);  Surgeon: [name], MD;  Location: [facility] CATH LAB;  Service: Cardiovascular;;   Patient Active Problem List   Diagnosis Date Noted   Carbuncle 10/07/2023   Hospital discharge follow-up 10/07/2023   Abnormal LFTs 09/26/2023   Cauda equina compression (HCC) 09/14/2023   Cauda equina syndrome with neurogenic bladder (HCC) 09/12/2023   Lumbar spinal stenosis 09/11/2023   Tendinopathy of right rotator cuff 02/03/2023   Mixed hyperlipidemia 01/23/2022   Encounter for general adult medical examination with abnormal findings 07/18/2021   Prediabetes 07/18/2021   Vitamin D  deficiency 07/18/2021   History of diverticulitis 02/24/2021   Pancreatic lesion 02/24/2021   Prostate cancer screening 01/30/2021  Primary insomnia 01/27/2021   Hepatomegaly    Essential hypertension 08/18/2020   Idiopathic gout 11/16/2018   Neck mass 12/31/2015   Old anteroseptal myocardial infarction 08/24/2011   Coronary artery disease involving native coronary artery of native heart without angina pectoris 08/24/2011    PCP: [name], MD  REFERRING PROVIDER: Maurice Sharlet RAMAN, PA-C  REFERRING DIAG:  G83.4 (ICD-10-CM) - Cauda equina compression [facility])    Rationale for Evaluation and Treatment: Rehabilitation  THERAPY DIAG:  Muscle  weakness  Impaired functional mobility, balance, gait, and endurance  Cauda equina compression [facility])  ONSET DATE: September 09, 2023  SUBJECTIVE:                                                                                                                                                                                           SUBJECTIVE STATEMENT: Pt states no back pain today but pt states he is tired. He and his wife had to drive down to [place] this morning, already had a full day. Pt reports no falls, balance somewhat improved. Pt states he has trial walking about 6 steps with no AD at home.     EVAL: Pt reports he was pain free until August 21st. Reports he woke up the next day with low back and LE pain. Reports he went to ER but they sent him home. N/T started a few days after that and he went back to ER, they sent him to [facility] and he had surgery the next day. Reports he had inpatient rehab for 1 week post-op. Pt reports he had a follow up this morning. Doctor pleased with progress.   PERTINENT HISTORY:  Lumbar decompression, fusion, L4-5 on 8/23 R shoulder Rotator cuff pain  PAIN:  Are you having pain? No  PRECAUTIONS: Back, Pt wearing back brace, Reports under BLT precautions (bending, lifting, twisting)  - Plans to follow up with MD regarding timeline of precautions   RED FLAGS: Reports cont N/T down both LE, most in buttocks    WEIGHT BEARING RESTRICTIONS: No  FALLS:  Has patient fallen in last 6 months? No  LIVING ENVIRONMENT: Lives with: lives with their spouse Lives in: House/apartment Stairs: Yes: Internal: 13 steps; can reach both and External: 2 steps; none Has following equipment at home: Vannie - 2 wheeled and Shower bench, Back brace  OCCUPATION: N/A  PLOF: Reports bathing at sink, independent with dressing, reports wife doing all cleaning now, can prepare own meals   PATIENT GOALS: For 100% recovery, mobile, active, golf (last played last  year)  NEXT MD VISIT: October 17th  OBJECTIVE:  Note: Objective measures  were completed at Evaluation unless otherwise noted.  DIAGNOSTIC FINDINGS:  IMPRESSION: 1. Bulky disc extrusion at L4-L5 with very Severe spinal stenosis. Lateral recess stenosis may be greater on the left. Query L5 radiculitis. 2. Superimposed multifactorial Severe spinal stenosis also at L3-L4, and moderate spinal stenosis at L2-L3. Up to moderate bilateral L2 neural foraminal stenosis.  IMPRESSION: Intraoperative L4-L5 posterolateral and interbody surgical hardware.    PATIENT SURVEYS:  Modified Oswestry: Modified Oswestry Low Back Pain Disability Questionnaire: 20 / 50 = 40.0 %  Interpretation of scores: Score Category Description  0-20% Minimal Disability The patient can cope with most living activities. Usually no treatment is indicated apart from advice on lifting, sitting and exercise  21-40% Moderate Disability The patient experiences more pain and difficulty with sitting, lifting and standing. Travel and social life are more difficult and they may be disabled from work. Personal care, sexual activity and sleeping are not grossly affected, and the patient can usually be managed by conservative means  41-60% Severe Disability Pain remains the main problem in this group, but activities of daily living are affected. These patients require a detailed investigation  61-80% Crippled Back pain impinges on all aspects of the patient's life. Positive intervention is required  81-100% Bed-bound  These patients are either bed-bound or exaggerating their symptoms  Bluford FORBES Zoe DELENA Karon DELENA, et al. Surgery versus conservative management of stable thoracolumbar fracture: the PRESTO feasibility RCT. [place]): [employer]; 2021 Nov. [facility] Technology Assessment, No. 25.62.) Appendix 3, Oswestry Disability Index category descriptors. Available from:  FindJewel[employer].cz  Minimally Clinically Important Difference (MCID) = 12.8% LEFS  Extreme difficulty/unable (0), Quite a bit of difficulty (1), Moderate difficulty (2), Little difficulty (3), No difficulty (4) Survey date:    Any of your usual work, housework or school activities   2. Usual hobbies, recreational or sporting activities   3. Getting into/out of the bath   4. Walking between rooms   5. Putting on socks/shoes   6. Squatting    7. Lifting an object, like a bag of groceries from the floor   8. Performing light activities around your home   9. Performing heavy activities around your home   10. Getting into/out of a car   11. Walking 2 blocks   12. Walking 1 mile   13. Going up/down 10 stairs (1 flight)   14. Standing for 1 hour   15.  sitting for 1 hour   16. Running on even ground   17. Running on uneven ground   18. Making sharp turns while running fast   19. Hopping    20. Rolling over in bed   Score total:  Lower Extremity Functional Score: 45 / 80 = 56.3 %      COGNITION: Overall cognitive status: Within functional limits for tasks assessed     SENSATION: WFL,   POSTURE: rounded shoulders and forward head  PALPATION:  N/A  LUMBAR ROM:   AROM eval  Flexion   Extension   Right lateral flexion   Left lateral flexion   Right rotation   Left rotation    (Blank rows = not tested)  LOWER EXTREMITY ROM:     Active  Right eval Left eval  Hip flexion    Hip extension    Hip abduction    Hip adduction    Hip internal rotation    Hip external rotation    Knee flexion    Knee extension    Ankle  dorsiflexion    Ankle plantarflexion    Ankle inversion    Ankle eversion     (Blank rows = not tested)  LOWER EXTREMITY MMT:    MMT Right eval Left eval  Hip flexion 4 4  Hip extension 2+ 2+  Hip abduction 3 3-  Hip adduction    Hip internal rotation    Hip external rotation    Knee flexion    Knee extension     Ankle dorsiflexion 5 5  Ankle plantarflexion    Ankle inversion    Ankle eversion     (Blank rows = not tested)  LUMBAR SPECIAL TESTS:    FUNCTIONAL TESTS:  30 seconds chair stand test 2 minute walk test: 321 ft on 10/05/23 30 second chair stand: 6 STS, no UE use  Tandem balance: 26 with RLE leading, 25 with LLE leading SLS: unable with BLE  GAIT: Distance walked: 100 ft in session  Assistive device utilized: Walker - 2 wheeled Level of assistance: Modified independence Comments: Dec speed, ambulates with RW, back brace donned, slight forward posture,   TREATMENT DATE:  10/08/2023  Therapeutic Exercise: -Nustep, level 3 resistance, 5 minutes, pt cued for increased pace 95-97 SPM -Hip marches with dorsiflexion pickup with 5lb kettle bell, 1 set of 7 reps bilaterally -Supine bridges, GTB at knees 2 sets of 7 reps, 3 second holds, symptomatic, pt cued for max hip extension -Standing 3 way hip 1 sets 5 reps, GTB at ankles, bilaterally, pt cued for upright trunk and maintaining of neutral spine -Lateral stepping 2 laps 10 feet per lap, with GTB around ankles, pt cued for upright posture -Modified Crunch, 1 set of 10 reps, 3 seconds, pt cued for proper set up and sequencing  Therapeutic Activity: -Sit to stands with 5lb kettle bell at chest, 2 sets of 8 reps, pt cued for core activation, 2nd set with LLE further underneath COM -Step up and overs, 8 inch step, 1 set of 4 reps, pt cued for one UE support, pt demonstrates one LOB, therapist assist for correction -Lateral step up and overs, 8 inch step, 1 set of 5 reps, pt requires one UE support  10/05/23: Recumbent bike, seat 10, level 2, 5' Review of goals 2 Minute Walk Test Review HEP: LAQ, 10x each LE Sit to stand, 20 inch height, 10x Clamshells, 10x each side Bridge, 10x Tandem Balance, 30  Heel raises, 2x10, in // bars Toe raises on decline, 2x10 Hip Vectors, 3 holds, 5x   10/01/23: PT Eval and HEP                                                                                                                             PATIENT EDUCATION:  Education details: PT evaluation, objective findings, POC, Importance of HEP, Precautions, Clinic policies  Person educated: Patient Education method: Explanation and Demonstration Education comprehension: verbalized understanding and returned demonstration  HOME EXERCISE PROGRAM: Access Code: [email or web address] URL: [email or web address]  Date: 10/08/2023 Prepared by: Lang Ada  Exercises - Clamshell  - 2 x daily - 7 x weekly - 3 sets - 10 reps - Supine Bridge  - 2 x daily - 7 x weekly - 3 sets - 10 reps - Seated Long Arc Quad  - 2 x daily - 7 x weekly - 3 sets - 10 reps - Sit to Stand  - 2 x daily - 7 x weekly - 3 sets - 10 reps - Standing Tandem Balance with Counter Support  - 2 x daily - 7 x weekly - 3 sets - 10 reps - 30 hold - Neutral Curl Up with Straight Leg  - 1 x daily - 7 x weekly - 3 sets - 10 reps - 3 hold   ASSESSMENT:  CLINICAL IMPRESSION: Patient continues to demonstrate decreased LE strength left more so than right, decreased gait quality and balance. Patient also demonstrates good self awareness of needing to maintain breathing pattern during exercises today. Pt continues to demonstrate decreased endurance with aerobic based exercise during today's session. Patient able to progress dynamic balance and core activation exercises today with step up variations and resisted walking, good performance with verbal cueing. Patient would continue to benefit from skilled physical therapy for increased endurance with ambulation, increased LE strength, and improved balance for improved quality of life, improved independence with gait training and continued progress towards therapy goals.      EVAL:  Patient is a 78 y.o. male who was seen today for physical therapy evaluation and treatment for  G83.4 (ICD-10-CM) - Cauda equina compression (HCC)  . On  this date, patient demonstrates decreased self perceived function via Modified oswestry as well as LEFS, decreased LE strength, impaired balance, altered gait, and decreased activity tolerance all which may be contributing to patients impaired overall function. Patient reports he's maintaining his no lifting,bending, twisting precautions and wearing brace when OOB. Will follow up with surgeon regarding timeline of precautions and brace wear. Patient will benefit from continued skilled physical therapy in order to address the above/below to improve current level of function for better quality of life.   OBJECTIVE IMPAIRMENTS: Abnormal gait, decreased activity tolerance, decreased balance, decreased endurance, decreased mobility, difficulty walking, decreased ROM, decreased strength, and postural dysfunction.   ACTIVITY LIMITATIONS: lifting, bending, standing, squatting, sleeping, stairs, and transfers  PARTICIPATION LIMITATIONS: meal prep, cleaning, laundry, driving, community activity, and yard work  PERSONAL FACTORS: N/A are also affecting patient's functional outcome.   REHAB POTENTIAL: Good  CLINICAL DECISION MAKING: Stable/uncomplicated  EVALUATION COMPLEXITY: Low   GOALS: Goals reviewed with patient? Yes  SHORT TERM GOALS: Target date: 10/22/23 Patient will be independent with performance of HEP to demonstrate adequate self management of symptoms.  Baseline:  Goal status: INITIAL  2.   Patient will report at least a 25% improvement with function and/or pain reduction overall since beginning PT. Baseline:  Goal status: INITIAL   LONG TERM GOALS: Target date: 11/11/23 Patient will improve LEFS score by 9 points to demonstrate improved perceived function while meeting MCID.  Baseline: Goal status: INITIAL Patient will improve Modified Oswestry score by 12.8% to demonstrate improved perceived function while meeting MCID.  Baseline: Goal status: INITIAL 3.  Patient will score at  least a  4/5  on hip extension and abduction MMT with BLE to demonstrate increased LE strength and/or power needed to improve ambulation/gait mechanics.  Baseline:  Goal status: INITIAL   4.  Patient will increase 2 minute walk test gait  distance by at least 40 ft with least restrictive assistive device to demonstrate improved endurance and functional mobility needed for ambulating household and community distances.  Baseline: Goal status: INITIAL 5. Patient will be able to maintain SL balance on each LE for at least 5 to demonstrate improved balance needed to reduce risk of falls.  Baseline: Goal status: INITIAL   PLAN:  PT FREQUENCY: 2x/week  PT DURATION: 8 weeks  PLANNED INTERVENTIONS: 97164- PT Re-evaluation, 97110-Therapeutic exercises, 97530- Therapeutic activity, [id number]- Neuromuscular re-education, 97535- Self Care, [id number]- Manual therapy, [id number]- Gait training, [phone]- Electrical stimulation (manual), [id number]- Traction (mechanical), [phone] (1-2 muscles), [id number] (3+ muscles)- Dry Needling, Patient/Family education, Balance training, Stair training, Taping, Joint mobilization, Spinal mobilization, Cryotherapy, and Moist heat.  PLAN FOR NEXT SESSION: progress LE strengthening, balance, reduce AD as appropriate   Lang Ada, PT, DPT [facility] Office: [phone] 2:33 PM, 10/08/23

## 2023-10-12 ENCOUNTER — Ambulatory Visit (HOSPITAL_COMMUNITY)

## 2023-10-12 DIAGNOSIS — G834 Cauda equina syndrome: Secondary | ICD-10-CM | POA: Diagnosis not present

## 2023-10-12 DIAGNOSIS — Z7409 Other reduced mobility: Secondary | ICD-10-CM | POA: Diagnosis not present

## 2023-10-12 DIAGNOSIS — M6281 Muscle weakness (generalized): Secondary | ICD-10-CM

## 2023-10-12 NOTE — Therapy (Signed)
 OUTPATIENT PHYSICAL THERAPY THORACOLUMBAR TREATMENT   Patient Name: Austin Bright MRN: [id number] DOB:08-Oct-1945, 78 y.o., male Today's Date: 10/12/2023  END OF SESSION:  PT End of Session - 10/12/23 1333     Visit Number 4    Number of Visits 16    Date for Recertification  10/29/23    Authorization Type [employer]    Authorization Time Period cohere covered 13 visits from 10/01/23-12/30/2023    Authorization - Visit Number 4    Authorization - Number of Visits 13    Progress Note Due on Visit 10    PT Start Time 1333    PT Stop Time 1413    PT Time Calculation (min) 40 min    Equipment Utilized During Treatment Back brace    Activity Tolerance Patient tolerated treatment well    Behavior During Therapy [facility] for tasks assessed/performed           Past Medical History:  Diagnosis Date   Acute diverticulitis 08/17/2020   Acute MI (HCC) 08/2011   Arthritis    Bilateral pneumonia    Diagnosed after STEMI 08/2011   CAD (coronary artery disease)    a. Diagnosed 08/2011 with anterior STEMI due to thrombotic occlusion of mid LAD s/p thrombectomy, PTCA, DES placement 08/23/11.   CHF (congestive heart failure) (HCC)    Chronic combined systolic and diastolic congestive heart failure (HCC) 09/15/2011   Dyslipidemia    Gout    Hypertension    Ischemic cardiomyopathy    a. Initial EF 35% by cath 08/23/11, improved to 40-45% by echo 08/25/11. 50-55% by echo November 2013.   Rotator cuff tear arthropathy, right    Shortness of breath    Past Surgical History:  Procedure Laterality Date   carpel tunnel Bilateral 2002   COLONOSCOPY WITH PROPOFOL  N/A 04/08/2017   Surgeon: [name], MD;  diverticulosis in sigmoid and descending colon, internal hemorrhoids, otherwise normal exam.  No recommendations to repeat due to age.   COLONOSCOPY WITH PROPOFOL  N/A 03/17/2021   Procedure: COLONOSCOPY WITH PROPOFOL ;  Surgeon: [name], DO;  Location: [facility];  Service:  Endoscopy;  Laterality: N/A;  8:00am   CORONARY STENT PLACEMENT     CYSTECTOMY  1969   pilonidal cyst   LEFT AND RIGHT HEART CATHETERIZATION WITH CORONARY ANGIOGRAM N/A 09/15/2011   Procedure: LEFT AND RIGHT HEART CATHETERIZATION WITH CORONARY ANGIOGRAM;  Surgeon: [name], MD;  Location: [facility] CATH LAB;  Service: Cardiovascular;  Laterality: N/A;   LEFT HEART CATH N/A 08/23/2011   Procedure: LEFT HEART CATH;  Surgeon: [name], MD;  Location: [facility] CATH LAB;  Service: Cardiovascular;  Laterality: N/A;   NO PAST SURGERIES     PERCUTANEOUS CORONARY STENT INTERVENTION (PCI-S)  08/23/2011   Procedure: PERCUTANEOUS CORONARY STENT INTERVENTION (PCI-S);  Surgeon: [name], MD;  Location: [facility] CATH LAB;  Service: Cardiovascular;;   Patient Active Problem List   Diagnosis Date Noted   Carbuncle 10/07/2023   Hospital discharge follow-up 10/07/2023   Abnormal LFTs 09/26/2023   Cauda equina compression (HCC) 09/14/2023   Cauda equina syndrome with neurogenic bladder (HCC) 09/12/2023   Lumbar spinal stenosis 09/11/2023   Tendinopathy of right rotator cuff 02/03/2023   Mixed hyperlipidemia 01/23/2022   Encounter for general adult medical examination with abnormal findings 07/18/2021   Prediabetes 07/18/2021   Vitamin D  deficiency 07/18/2021   History of diverticulitis 02/24/2021   Pancreatic lesion 02/24/2021   Prostate cancer screening 01/30/2021  Primary insomnia 01/27/2021   Hepatomegaly    Essential hypertension 08/18/2020   Idiopathic gout 11/16/2018   Neck mass 12/31/2015   Old anteroseptal myocardial infarction 08/24/2011   Coronary artery disease involving native coronary artery of native heart without angina pectoris 08/24/2011    PCP: [name], MD  REFERRING PROVIDER: Maurice Sharlet RAMAN, PA-C  REFERRING DIAG:  G83.4 (ICD-10-CM) - Cauda equina compression [facility])    Rationale for Evaluation and Treatment: Rehabilitation  THERAPY DIAG:  Muscle  weakness  Impaired functional mobility, balance, gait, and endurance  Cauda equina compression (HCC)  Cauda equina syndrome with neurogenic bladder [facility])  ONSET DATE: September 09, 2023  SUBJECTIVE:                                                                                                                                                                                           SUBJECTIVE STATEMENT: No pain; has been trying to work a little on unassisted walking at home.  Would like to try walking with rollator  EVAL: Pt reports he was pain free until August 21st. Reports he woke up the next day with low back and LE pain. Reports he went to ER but they sent him home. N/T started a few days after that and he went back to ER, they sent him to [facility] and he had surgery the next day. Reports he had inpatient rehab for 1 week post-op. Pt reports he had a follow up this morning. Doctor pleased with progress.   PERTINENT HISTORY:  Lumbar decompression, fusion, L4-5 on 8/23 R shoulder Rotator cuff pain  PAIN:  Are you having pain? No  PRECAUTIONS: Back, Pt wearing back brace, Reports under BLT precautions (bending, lifting, twisting)  - Plans to follow up with MD regarding timeline of precautions   RED FLAGS: Reports cont N/T down both LE, most in buttocks    WEIGHT BEARING RESTRICTIONS: No  FALLS:  Has patient fallen in last 6 months? No  LIVING ENVIRONMENT: Lives with: lives with their spouse Lives in: House/apartment Stairs: Yes: Internal: 13 steps; can reach both and External: 2 steps; none Has following equipment at home: Vannie - 2 wheeled and Shower bench, Back brace  OCCUPATION: N/A  PLOF: Reports bathing at sink, independent with dressing, reports wife doing all cleaning now, can prepare own meals   PATIENT GOALS: For 100% recovery, mobile, active, golf (last played last year)  NEXT MD VISIT: October 17th  OBJECTIVE:  Note: Objective measures were completed at  Evaluation unless otherwise noted.  DIAGNOSTIC FINDINGS:  IMPRESSION: 1. Bulky disc extrusion at L4-L5 with very Severe spinal stenosis. Lateral  recess stenosis may be greater on the left. Query L5 radiculitis. 2. Superimposed multifactorial Severe spinal stenosis also at L3-L4, and moderate spinal stenosis at L2-L3. Up to moderate bilateral L2 neural foraminal stenosis.  IMPRESSION: Intraoperative L4-L5 posterolateral and interbody surgical hardware.    PATIENT SURVEYS:  Modified Oswestry: Modified Oswestry Low Back Pain Disability Questionnaire: 20 / 50 = 40.0 %  Interpretation of scores: Score Category Description  0-20% Minimal Disability The patient can cope with most living activities. Usually no treatment is indicated apart from advice on lifting, sitting and exercise  21-40% Moderate Disability The patient experiences more pain and difficulty with sitting, lifting and standing. Travel and social life are more difficult and they may be disabled from work. Personal care, sexual activity and sleeping are not grossly affected, and the patient can usually be managed by conservative means  41-60% Severe Disability Pain remains the main problem in this group, but activities of daily living are affected. These patients require a detailed investigation  61-80% Crippled Back pain impinges on all aspects of the patient's life. Positive intervention is required  81-100% Bed-bound  These patients are either bed-bound or exaggerating their symptoms  Bluford FORBES Zoe DELENA Karon DELENA, et al. Surgery versus conservative management of stable thoracolumbar fracture: the PRESTO feasibility RCT. [place]): [employer]; 2021 Nov. [facility] Technology Assessment, No. 25.62.) Appendix 3, Oswestry Disability Index category descriptors. Available from: FindJewel[employer]cz  Minimally Clinically Important Difference (MCID) = 12.8% LEFS  Extreme difficulty/unable (0),  Quite a bit of difficulty (1), Moderate difficulty (2), Little difficulty (3), No difficulty (4) Survey date:    Any of your usual work, housework or school activities   2. Usual hobbies, recreational or sporting activities   3. Getting into/out of the bath   4. Walking between rooms   5. Putting on socks/shoes   6. Squatting    7. Lifting an object, like a bag of groceries from the floor   8. Performing light activities around your home   9. Performing heavy activities around your home   10. Getting into/out of a car   11. Walking 2 blocks   12. Walking 1 mile   13. Going up/down 10 stairs (1 flight)   14. Standing for 1 hour   15.  sitting for 1 hour   16. Running on even ground   17. Running on uneven ground   18. Making sharp turns while running fast   19. Hopping    20. Rolling over in bed   Score total:  Lower Extremity Functional Score: 45 / 80 = 56.3 %      COGNITION: Overall cognitive status: Within functional limits for tasks assessed     SENSATION: WFL,   POSTURE: rounded shoulders and forward head  PALPATION:  N/A  LUMBAR ROM:   AROM eval  Flexion   Extension   Right lateral flexion   Left lateral flexion   Right rotation   Left rotation    (Blank rows = not tested)  LOWER EXTREMITY ROM:     Active  Right eval Left eval  Hip flexion    Hip extension    Hip abduction    Hip adduction    Hip internal rotation    Hip external rotation    Knee flexion    Knee extension    Ankle dorsiflexion    Ankle plantarflexion    Ankle inversion    Ankle eversion     (Blank rows = not  tested)  LOWER EXTREMITY MMT:    MMT Right eval Left eval  Hip flexion 4 4  Hip extension 2+ 2+  Hip abduction 3 3-  Hip adduction    Hip internal rotation    Hip external rotation    Knee flexion    Knee extension    Ankle dorsiflexion 5 5  Ankle plantarflexion    Ankle inversion    Ankle eversion     (Blank rows = not tested)  LUMBAR SPECIAL TESTS:     FUNCTIONAL TESTS:  30 seconds chair stand test 2 minute walk test: 321 ft on 10/05/23 30 second chair stand: 6 STS, no UE use  Tandem balance: 26 with RLE leading, 25 with LLE leading SLS: unable with BLE  GAIT: Distance walked: 100 ft in session  Assistive device utilized: Walker - 2 wheeled Level of assistance: Modified independence Comments: Dec speed, ambulates with RW, back brace donned, slight forward posture,   TREATMENT DATE:  10/12/23 Nustep seat 10 x 5' level 3 dynamic warm up // bars walking without UE assist down and back x 3, CGA Heel raises on incline x 10 Toe raises on decline x 10 Standing No UE assist x 30 sec Head turns x 6 Head nods x 6 Eyes closed x 30 Tandem stance x 30  each way Step navigation reciprocal pattern using bilateral hand rails up and down x 3; 7 steps Sidestepping in // bars no UE assist   10/08/2023  Therapeutic Exercise: -Nustep, level 3 resistance, 5 minutes, pt cued for increased pace 95-97 SPM -Hip marches with dorsiflexion pickup with 5lb kettle bell, 1 set of 7 reps bilaterally -Supine bridges, GTB at knees 2 sets of 7 reps, 3 second holds, symptomatic, pt cued for max hip extension -Standing 3 way hip 1 sets 5 reps, GTB at ankles, bilaterally, pt cued for upright trunk and maintaining of neutral spine -Lateral stepping 2 laps 10 feet per lap, with GTB around ankles, pt cued for upright posture -Modified Crunch, 1 set of 10 reps, 3 seconds, pt cued for proper set up and sequencing  Therapeutic Activity: -Sit to stands with 5lb kettle bell at chest, 2 sets of 8 reps, pt cued for core activation, 2nd set with LLE further underneath COM -Step up and overs, 8 inch step, 1 set of 4 reps, pt cued for one UE support, pt demonstrates one LOB, therapist assist for correction -Lateral step up and overs, 8 inch step, 1 set of 5 reps, pt requires one UE support  10/05/23: Recumbent bike, seat 10, level 2, 5' Review of goals 2 Minute  Walk Test Review HEP: LAQ, 10x each LE Sit to stand, 20 inch height, 10x Clamshells, 10x each side Bridge, 10x Tandem Balance, 30  Heel raises, 2x10, in // bars Toe raises on decline, 2x10 Hip Vectors, 3 holds, 5x   10/01/23: PT Eval and HEP  PATIENT EDUCATION:  Education details: PT evaluation, objective findings, POC, Importance of HEP, Precautions, Clinic policies  Person educated: Patient Education method: Explanation and Demonstration Education comprehension: verbalized understanding and returned demonstration  HOME EXERCISE PROGRAM: Access Code: [email or web address] URL: [email or web address] Date: 10/08/2023 Prepared by: Lang Ada  Exercises - Clamshell  - 2 x daily - 7 x weekly - 3 sets - 10 reps - Supine Bridge  - 2 x daily - 7 x weekly - 3 sets - 10 reps - Seated Long Arc Quad  - 2 x daily - 7 x weekly - 3 sets - 10 reps - Sit to Stand  - 2 x daily - 7 x weekly - 3 sets - 10 reps - Standing Tandem Balance with Counter Support  - 2 x daily - 7 x weekly - 3 sets - 10 reps - 30 hold - Neutral Curl Up with Straight Leg  - 1 x daily - 7 x weekly - 3 sets - 10 reps - 3 hold   ASSESSMENT:  CLINICAL IMPRESSION: Patient with noted swelling left lower leg versus right today; he used to take a fluid pill before his surgery so discussed with him weighing daily and discussing resuming meds with his PCP.  Continued to work on strengthening and balance.  Encouraged him to bring in his rollator to try next visit as he has an upcoming event that will require longer distance walking.  Has 12 steps to navigate to reach his bedroom so worked on steps today; patient able to perform safely with bilateral railings and CGA for safety reciprocal pattern.    Patient would continue to benefit from skilled physical therapy for increased endurance with ambulation,  increased LE strength, and improved balance for improved quality of life, improved independence with gait training and continued progress towards therapy goals.      EVAL:  Patient is a 78 y.o. male who was seen today for physical therapy evaluation and treatment for  G83.4 (ICD-10-CM) - Cauda equina compression (HCC)  . On this date, patient demonstrates decreased self perceived function via Modified oswestry as well as LEFS, decreased LE strength, impaired balance, altered gait, and decreased activity tolerance all which may be contributing to patients impaired overall function. Patient reports he's maintaining his no lifting,bending, twisting precautions and wearing brace when OOB. Will follow up with surgeon regarding timeline of precautions and brace wear. Patient will benefit from continued skilled physical therapy in order to address the above/below to improve current level of function for better quality of life.   OBJECTIVE IMPAIRMENTS: Abnormal gait, decreased activity tolerance, decreased balance, decreased endurance, decreased mobility, difficulty walking, decreased ROM, decreased strength, and postural dysfunction.   ACTIVITY LIMITATIONS: lifting, bending, standing, squatting, sleeping, stairs, and transfers  PARTICIPATION LIMITATIONS: meal prep, cleaning, laundry, driving, community activity, and yard work  PERSONAL FACTORS: N/A are also affecting patient's functional outcome.   REHAB POTENTIAL: Good  CLINICAL DECISION MAKING: Stable/uncomplicated  EVALUATION COMPLEXITY: Low   GOALS: Goals reviewed with patient? Yes  SHORT TERM GOALS: Target date: 10/22/23 Patient will be independent with performance of HEP to demonstrate adequate self management of symptoms.  Baseline:  Goal status: INITIAL  2.   Patient will report at least a 25% improvement with function and/or pain reduction overall since beginning PT. Baseline:  Goal status: INITIAL   LONG TERM GOALS: Target  date: 11/11/23 Patient will improve LEFS score by 9 points to demonstrate improved perceived function while meeting MCID.  Baseline: Goal status: INITIAL  Patient will improve Modified Oswestry score by 12.8% to demonstrate improved perceived function while meeting MCID.  Baseline: Goal status: INITIAL 3.  Patient will score at least a  4/5  on hip extension and abduction MMT with BLE to demonstrate increased LE strength and/or power needed to improve ambulation/gait mechanics.  Baseline:  Goal status: INITIAL   4.  Patient will increase 2 minute walk test gait distance by at least 40 ft with least restrictive assistive device to demonstrate improved endurance and functional mobility needed for ambulating household and community distances.  Baseline: Goal status: INITIAL 5. Patient will be able to maintain SL balance on each LE for at least 5 to demonstrate improved balance needed to reduce risk of falls.  Baseline: Goal status: INITIAL   PLAN:  PT FREQUENCY: 2x/week  PT DURATION: 8 weeks  PLANNED INTERVENTIONS: 97164- PT Re-evaluation, 97110-Therapeutic exercises, 97530- Therapeutic activity, [id number]- Neuromuscular re-education, 97535- Self Care, [id number]- Manual therapy, [id number]- Gait training, [phone]- Electrical stimulation (manual), [id number]- Traction (mechanical), [phone] (1-2 muscles), [id number] (3+ muscles)- Dry Needling, Patient/Family education, Balance training, Stair training, Taping, Joint mobilization, Spinal mobilization, Cryotherapy, and Moist heat.  PLAN FOR NEXT SESSION: progress LE strengthening, balance, reduce AD as appropriate  2:27 PM, 10/12/23 Tylie Golonka Small Veatrice Eckstein MPT [place] [facility] [place] [phone]

## 2023-10-13 ENCOUNTER — Encounter (HOSPITAL_COMMUNITY): Payer: Self-pay | Admitting: Neurosurgery

## 2023-10-13 ENCOUNTER — Other Ambulatory Visit: Payer: Self-pay | Admitting: Internal Medicine

## 2023-10-13 ENCOUNTER — Encounter: Payer: Self-pay | Admitting: Internal Medicine

## 2023-10-13 ENCOUNTER — Ambulatory Visit: Admitting: Internal Medicine

## 2023-10-13 DIAGNOSIS — L0293 Carbuncle, unspecified: Secondary | ICD-10-CM

## 2023-10-13 LAB — CMP14+EGFR

## 2023-10-13 MED ORDER — CEPHALEXIN 500 MG PO CAPS: 500.0000 mg | ORAL_CAPSULE | Freq: Three times a day (TID) | ORAL | 0 refills | Status: DC

## 2023-10-14 ENCOUNTER — Ambulatory Visit: Payer: Self-pay | Admitting: Internal Medicine

## 2023-10-14 LAB — CMP14+EGFR

## 2023-10-14 LAB — CBC WITH DIFFERENTIAL/PLATELET
Basophils Absolute: 0 x10E3/uL (ref 0.0–0.2)
Basos: 0 %
EOS (ABSOLUTE): 0.1 x10E3/uL (ref 0.0–0.4)
Eos: 1 %
Hematocrit: 43 % (ref 37.5–51.0)
Hemoglobin: 14 g/dL (ref 13.0–17.7)
Immature Grans (Abs): 0 x10E3/uL (ref 0.0–0.1)
Immature Granulocytes: 0 %
Lymphocytes Absolute: 1.5 x10E3/uL (ref 0.7–3.1)
Lymphs: 18 %
MCH: 31.5 pg (ref 26.6–33.0)
MCHC: 32.6 g/dL (ref 31.5–35.7)
MCV: 97 fL (ref 79–97)
Monocytes Absolute: 0.7 x10E3/uL (ref 0.1–0.9)
Monocytes: 9 %
Neutrophils Absolute: 6 x10E3/uL (ref 1.4–7.0)
Neutrophils: 72 %
Platelets: 195 x10E3/uL (ref 150–450)
RBC: 4.45 x10E6/uL (ref 4.14–5.80)
RDW: 12.9 % (ref 11.6–15.4)
WBC: 8.4 x10E3/uL (ref 3.4–10.8)

## 2023-10-15 ENCOUNTER — Ambulatory Visit (HOSPITAL_COMMUNITY)

## 2023-10-15 ENCOUNTER — Encounter (HOSPITAL_COMMUNITY): Payer: Self-pay

## 2023-10-15 DIAGNOSIS — Z7409 Other reduced mobility: Secondary | ICD-10-CM | POA: Diagnosis not present

## 2023-10-15 DIAGNOSIS — M6281 Muscle weakness (generalized): Secondary | ICD-10-CM

## 2023-10-15 DIAGNOSIS — G834 Cauda equina syndrome: Secondary | ICD-10-CM | POA: Diagnosis not present

## 2023-10-15 NOTE — Therapy (Signed)
 OUTPATIENT PHYSICAL THERAPY THORACOLUMBAR TREATMENT   Patient Name: Austin Bright MRN: [id number] DOB:02/10/1945, 78 y.o., male Today's Date: 10/15/2023  END OF SESSION:  PT End of Session - 10/15/23 1237     Visit Number 5    Number of Visits 16    Date for Recertification  10/29/23    Authorization Type [employer]    Authorization Time Period cohere covered 13 visits from 10/01/23-12/30/2023    Authorization - Visit Number 5    Authorization - Number of Visits 13    Progress Note Due on Visit 10    PT Start Time 1240    PT Stop Time 1326    PT Time Calculation (min) 46 min    Equipment Utilized During Treatment --    Activity Tolerance Patient tolerated treatment well    Behavior During Therapy [facility] for tasks assessed/performed           Past Medical History:  Diagnosis Date   Acute diverticulitis 08/17/2020   Acute MI (HCC) 08/2011   Arthritis    Bilateral pneumonia    Diagnosed after STEMI 08/2011   CAD (coronary artery disease)    a. Diagnosed 08/2011 with anterior STEMI due to thrombotic occlusion of mid LAD s/p thrombectomy, PTCA, DES placement 08/23/11.   CHF (congestive heart failure) (HCC)    Chronic combined systolic and diastolic congestive heart failure (HCC) 09/15/2011   Dyslipidemia    Gout    Hypertension    Ischemic cardiomyopathy    a. Initial EF 35% by cath 08/23/11, improved to 40-45% by echo 08/25/11. 50-55% by echo November 2013.   Rotator cuff tear arthropathy, right    Shortness of breath    Past Surgical History:  Procedure Laterality Date   carpel tunnel Bilateral 2002   COLONOSCOPY WITH PROPOFOL  N/A 04/08/2017   Surgeon: [name], MD;  diverticulosis in sigmoid and descending colon, internal hemorrhoids, otherwise normal exam.  No recommendations to repeat due to age.   COLONOSCOPY WITH PROPOFOL  N/A 03/17/2021   Procedure: COLONOSCOPY WITH PROPOFOL ;  Surgeon: [name], DO;  Location: [facility];  Service: Endoscopy;   Laterality: N/A;  8:00am   CORONARY STENT PLACEMENT     CYSTECTOMY  1969   pilonidal cyst   LEFT AND RIGHT HEART CATHETERIZATION WITH CORONARY ANGIOGRAM N/A 09/15/2011   Procedure: LEFT AND RIGHT HEART CATHETERIZATION WITH CORONARY ANGIOGRAM;  Surgeon: [name], MD;  Location: [facility] CATH LAB;  Service: Cardiovascular;  Laterality: N/A;   LEFT HEART CATH N/A 08/23/2011   Procedure: LEFT HEART CATH;  Surgeon: [name], MD;  Location: [facility] CATH LAB;  Service: Cardiovascular;  Laterality: N/A;   NO PAST SURGERIES     PERCUTANEOUS CORONARY STENT INTERVENTION (PCI-S)  08/23/2011   Procedure: PERCUTANEOUS CORONARY STENT INTERVENTION (PCI-S);  Surgeon: [name], MD;  Location: [facility] CATH LAB;  Service: Cardiovascular;;   Patient Active Problem List   Diagnosis Date Noted   Carbuncle 10/07/2023   Hospital discharge follow-up 10/07/2023   Abnormal LFTs 09/26/2023   Cauda equina compression (HCC) 09/14/2023   Cauda equina syndrome with neurogenic bladder (HCC) 09/12/2023   Lumbar spinal stenosis 09/11/2023   Tendinopathy of right rotator cuff 02/03/2023   Mixed hyperlipidemia 01/23/2022   Encounter for general adult medical examination with abnormal findings 07/18/2021   Prediabetes 07/18/2021   Vitamin D  deficiency 07/18/2021   History of diverticulitis 02/24/2021   Pancreatic lesion 02/24/2021   Prostate cancer screening 01/30/2021  Primary insomnia 01/27/2021   Hepatomegaly    Essential hypertension 08/18/2020   Idiopathic gout 11/16/2018   Neck mass 12/31/2015   Old anteroseptal myocardial infarction 08/24/2011   Coronary artery disease involving native coronary artery of native heart without angina pectoris 08/24/2011    PCP: [name], MD  REFERRING PROVIDER: Maurice Sharlet RAMAN, PA-C  REFERRING DIAG:  G83.4 (ICD-10-CM) - Cauda equina compression [facility])    Rationale for Evaluation and Treatment: Rehabilitation  THERAPY DIAG:  Muscle weakness  Impaired  functional mobility, balance, gait, and endurance  Cauda equina compression [facility])  ONSET DATE: September 09, 2023  SUBJECTIVE:                                                                                                                                                                                           SUBJECTIVE STATEMENT: Pt reports he hasn't practiced with [employer] yet. Plans to practice at local park or [employer]. Reports he's going to race in [place] next Sunday, reports he knows it is about 1400 feet/steps from car to building. Did not bring rollator to session today. Reports overall doing well today.   EVAL: Pt reports he was pain free until August 21st. Reports he woke up the next day with low back and LE pain. Reports he went to ER but they sent him home. N/T started a few days after that and he went back to ER, they sent him to [facility] and he had surgery the next day. Reports he had inpatient rehab for 1 week post-op. Pt reports he had a follow up this morning. Doctor pleased with progress.   PERTINENT HISTORY:  Lumbar decompression, fusion, L4-5 on 8/23 R shoulder Rotator cuff pain  PAIN:  Are you having pain? No  PRECAUTIONS: Back, Pt wearing back brace, Reports under BLT precautions (bending, lifting, twisting)  - Plans to follow up with MD regarding timeline of precautions   RED FLAGS: Reports cont N/T down both LE, most in buttocks    WEIGHT BEARING RESTRICTIONS: No  FALLS:  Has patient fallen in last 6 months? No  LIVING ENVIRONMENT: Lives with: lives with their spouse Lives in: House/apartment Stairs: Yes: Internal: 13 steps; can reach both and External: 2 steps; none Has following equipment at home: Vannie - 2 wheeled and Shower bench, Back brace  OCCUPATION: N/A  PLOF: Reports bathing at sink, independent with dressing, reports wife doing all cleaning now, can prepare own meals   PATIENT GOALS: For 100% recovery, mobile, active, golf (last played last  year)  NEXT MD VISIT: October 17th  OBJECTIVE:  Note: Objective measures were completed at Evaluation  unless otherwise noted.  DIAGNOSTIC FINDINGS:  IMPRESSION: 1. Bulky disc extrusion at L4-L5 with very Severe spinal stenosis. Lateral recess stenosis may be greater on the left. Query L5 radiculitis. 2. Superimposed multifactorial Severe spinal stenosis also at L3-L4, and moderate spinal stenosis at L2-L3. Up to moderate bilateral L2 neural foraminal stenosis.  IMPRESSION: Intraoperative L4-L5 posterolateral and interbody surgical hardware.    PATIENT SURVEYS:  Modified Oswestry: Modified Oswestry Low Back Pain Disability Questionnaire: 20 / 50 = 40.0 %  Interpretation of scores: Score Category Description  0-20% Minimal Disability The patient can cope with most living activities. Usually no treatment is indicated apart from advice on lifting, sitting and exercise  21-40% Moderate Disability The patient experiences more pain and difficulty with sitting, lifting and standing. Travel and social life are more difficult and they may be disabled from work. Personal care, sexual activity and sleeping are not grossly affected, and the patient can usually be managed by conservative means  41-60% Severe Disability Pain remains the main problem in this group, but activities of daily living are affected. These patients require a detailed investigation  61-80% Crippled Back pain impinges on all aspects of the patient's life. Positive intervention is required  81-100% Bed-bound  These patients are either bed-bound or exaggerating their symptoms  Bluford FORBES Zoe DELENA Karon DELENA, et al. Surgery versus conservative management of stable thoracolumbar fracture: the PRESTO feasibility RCT. [place]): [employer]; 2021 Nov. [facility] Technology Assessment, No. 25.62.) Appendix 3, Oswestry Disability Index category descriptors. Available from:  Find[employer]el[employer]cz  Minimally Clinically Important Difference (MCID) = 12.8% LEFS  Extreme difficulty/unable (0), Quite a bit of difficulty (1), Moderate difficulty (2), Little difficulty (3), No difficulty (4) Survey date:    Any of your usual work, housework or school activities   2. Usual hobbies, recreational or sporting activities   3. Getting into/out of the bath   4. Walking between rooms   5. Putting on socks/shoes   6. Squatting    7. Lifting an object, like a bag of groceries from the floor   8. Performing light activities around your home   9. Performing heavy activities around your home   10. Getting into/out of a car   11. Walking 2 blocks   12. Walking 1 mile   13. Going up/down 10 stairs (1 flight)   14. Standing for 1 hour   15.  sitting for 1 hour   16. Running on even ground   17. Running on uneven ground   18. Making sharp turns while running fast   19. Hopping    20. Rolling over in bed   Score total:  Lower Extremity Functional Score: 45 / 80 = 56.3 %      COGNITION: Overall cognitive status: Within functional limits for tasks assessed     SENSATION: WFL,   POSTURE: rounded shoulders and forward head  PALPATION:  N/A  LUMBAR ROM:   AROM eval  Flexion   Extension   Right lateral flexion   Left lateral flexion   Right rotation   Left rotation    (Blank rows = not tested)  LOWER EXTREMITY ROM:     Active  Right eval Left eval  Hip flexion    Hip extension    Hip abduction    Hip adduction    Hip internal rotation    Hip external rotation    Knee flexion    Knee extension    Ankle dorsiflexion  Ankle plantarflexion    Ankle inversion    Ankle eversion     (Blank rows = not tested)  LOWER EXTREMITY MMT:    MMT Right eval Left eval  Hip flexion 4 4  Hip extension 2+ 2+  Hip abduction 3 3-  Hip adduction    Hip internal rotation    Hip external rotation    Knee flexion    Knee extension     Ankle dorsiflexion 5 5  Ankle plantarflexion    Ankle inversion    Ankle eversion     (Blank rows = not tested)  LUMBAR SPECIAL TESTS:    FUNCTIONAL TESTS:  30 seconds chair stand test 2 minute walk test: 321 ft on 10/05/23 30 second chair stand: 6 STS, no UE use  Tandem balance: 26 with RLE leading, 25 with LLE leading SLS: unable with BLE  GAIT: Distance walked: 100 ft in session  Assistive device utilized: Walker - 2 wheeled Level of assistance: Modified independence Comments: Dec speed, ambulates with RW, back brace donned, slight forward posture,   TREATMENT DATE:  10/15/23: STS from chair, no UE support, 2x12 Side steps in // bars, BTB at knees, 5x down and back, last 2 without UE support Ambulating in // bars, RUE support only, 5 x down and back Ambulating with quad cane, 20 ftx4, CGA for safety  Ambulating 133 ft with quad cane, CGA Heel raises, 2x12, two finger support of one UE Tandem ambulation in // bars, 3x down and back, intermittent UE use to catch SLS: 5 on R, 2 on L  Static SLS holds, 30 holds Nustep seat 10 x 5' level 3    10/12/23 Nustep seat 10 x 5' level 3 dynamic warm up // bars walking without UE assist down and back x 3, CGA Heel raises on incline x 10 Toe raises on decline x 10 Standing No UE assist x 30 sec Head turns x 6 Head nods x 6 Eyes closed x 30 Tandem stance x 30  each way Step navigation reciprocal pattern using bilateral hand rails up and down x 3; 7 steps Sidestepping in // bars no UE assist   10/08/2023  Therapeutic Exercise: -Nustep, level 3 resistance, 5 minutes, pt cued for increased pace 95-97 SPM -Hip marches with dorsiflexion pickup with 5lb kettle bell, 1 set of 7 reps bilaterally -Supine bridges, GTB at knees 2 sets of 7 reps, 3 second holds, symptomatic, pt cued for max hip extension -Standing 3 way hip 1 sets 5 reps, GTB at ankles, bilaterally, pt cued for upright trunk and maintaining of neutral  spine -Lateral stepping 2 laps 10 feet per lap, with GTB around ankles, pt cued for upright posture -Modified Crunch, 1 set of 10 reps, 3 seconds, pt cued for proper set up and sequencing  Therapeutic Activity: -Sit to stands with 5lb kettle bell at chest, 2 sets of 8 reps, pt cued for core activation, 2nd set with LLE further underneath COM -Step up and overs, 8 inch step, 1 set of 4 reps, pt cued for one UE support, pt demonstrates one LOB, therapist assist for correction -Lateral step up and overs, 8 inch step, 1 set of 5 reps, pt requires one UE support    PATIENT EDUCATION:  Education details: PT evaluation, objective findings, POC, Importance of HEP, Precautions, Clinic policies  Person educated: Patient Education method: Explanation and Demonstration Education comprehension: verbalized understanding and returned demonstration  HOME EXERCISE PROGRAM: Access Code: [email or web address] URL: [email or web address] Date:  10/15/2023 Prepared by: Rosaria Powell-Butler  Exercises - Clamshell  - 2 x daily - 7 x weekly - 3 sets - 10 reps - Supine Bridge  - 2 x daily - 7 x weekly - 3 sets - 10 reps - Seated Long Arc Quad  - 2 x daily - 7 x weekly - 3 sets - 10 reps - Sit to Stand  - 2 x daily - 7 x weekly - 3 sets - 10 reps - Standing Tandem Balance with Counter Support  - 2 x daily - 7 x weekly - 3 sets - 10 reps - 30 hold - Neutral Curl Up with Straight Leg  - 1 x daily - 7 x weekly - 3 sets - 10 reps - 3 hold - Standing Single Leg Stance with Counter Support  - 2 x daily - 7 x weekly - 3 sets - 30 hold - Tandem Walking with Counter Support  - 2 x daily - 7 x weekly - 2 sets - 10 reps   ASSESSMENT:  CLINICAL IMPRESSION: Pt arrives to session ambulating to RW today. Began session with general LE strengthening, challenged with limiting UE use. Bulk of session spent on gait training. Educated patient on proper use of AD in UE opposite of weaker LE (In pt's RUE). Reports he tried  ambulating with SPC in LUE yesterday was felt very off balance. Slight unsteadiness this day, especially when ambulating/moving quicker. Encouraged to increase use of SPC in home with CGA initially. Ended with balance challenges. Pt requires CGA throughout and intermittent UE use to maintain balance. Added to HEP. Patient would continue to benefit from skilled physical therapy for increased endurance with ambulation, increased LE strength, and improved balance for improved quality of life, improved independence with gait training and continued progress towards therapy goals.       EVAL:  Patient is a 78 y.o. male who was seen today for physical therapy evaluation and treatment for  G83.4 (ICD-10-CM) - Cauda equina compression (HCC)  . On this date, patient demonstrates decreased self perceived function via Modified oswestry as well as LEFS, decreased LE strength, impaired balance, altered gait, and decreased activity tolerance all which may be contributing to patients impaired overall function. Patient reports he's maintaining his no lifting,bending, twisting precautions and wearing brace when OOB. Will follow up with surgeon regarding timeline of precautions and brace wear. Patient will benefit from continued skilled physical therapy in order to address the above/below to improve current level of function for better quality of life.   OBJECTIVE IMPAIRMENTS: Abnormal gait, decreased activity tolerance, decreased balance, decreased endurance, decreased mobility, difficulty walking, decreased ROM, decreased strength, and postural dysfunction.   ACTIVITY LIMITATIONS: lifting, bending, standing, squatting, sleeping, stairs, and transfers  PARTICIPATION LIMITATIONS: meal prep, cleaning, laundry, driving, community activity, and yard work  PERSONAL FACTORS: N/A are also affecting patient's functional outcome.   REHAB POTENTIAL: Good  CLINICAL DECISION MAKING: Stable/uncomplicated  EVALUATION  COMPLEXITY: Low   GOALS: Goals reviewed with patient? Yes  SHORT TERM GOALS: Target date: 10/22/23 Patient will be independent with performance of HEP to demonstrate adequate self management of symptoms.  Baseline:  Goal status: INITIAL  2.   Patient will report at least a 25% improvement with function and/or pain reduction overall since beginning PT. Baseline:  Goal status: INITIAL   LONG TERM GOALS: Target date: 11/11/23 Patient will improve LEFS score by 9 points to demonstrate improved perceived function while meeting MCID.  Baseline: Goal status: INITIAL Patient  will improve Modified Oswestry score by 12.8% to demonstrate improved perceived function while meeting MCID.  Baseline: Goal status: INITIAL 3.  Patient will score at least a  4/5  on hip extension and abduction MMT with BLE to demonstrate increased LE strength and/or power needed to improve ambulation/gait mechanics.  Baseline:  Goal status: INITIAL   4.  Patient will increase 2 minute walk test gait distance by at least 40 ft with least restrictive assistive device to demonstrate improved endurance and functional mobility needed for ambulating household and community distances.  Baseline: Goal status: INITIAL 5. Patient will be able to maintain SL balance on each LE for at least 5 to demonstrate improved balance needed to reduce risk of falls.  Baseline: Goal status: INITIAL   PLAN:  PT FREQUENCY: 2x/week  PT DURATION: 8 weeks  PLANNED INTERVENTIONS: 97164- PT Re-evaluation, 97110-Therapeutic exercises, 97530- Therapeutic activity, [id number]- Neuromuscular re-education, 97535- Self Care, [id number]- Manual therapy, [id number]- Gait training, [id number]- Electrical stimulation (manual), [id number]- Traction (mechanical), [phone] (1-2 muscles), [id number] (3+ muscles)- Dry Needling, Patient/Family education, Balance training, Stair training, Taping, Joint mobilization, Spinal mobilization, Cryotherapy, and Moist heat.  PLAN FOR NEXT SESSION:  progress LE strengthening, balance, reduce AD as appropriate - SPC in session   1:49 PM, 10/15/23 Ahonesty Woodfin Powell-Butler, PT, DPT [facility] [place]

## 2023-10-19 ENCOUNTER — Encounter (HOSPITAL_COMMUNITY): Payer: Self-pay

## 2023-10-19 ENCOUNTER — Ambulatory Visit (HOSPITAL_COMMUNITY)

## 2023-10-19 DIAGNOSIS — G834 Cauda equina syndrome: Secondary | ICD-10-CM | POA: Diagnosis not present

## 2023-10-19 DIAGNOSIS — M6281 Muscle weakness (generalized): Secondary | ICD-10-CM

## 2023-10-19 DIAGNOSIS — Z7409 Other reduced mobility: Secondary | ICD-10-CM

## 2023-10-19 NOTE — Therapy (Signed)
 OUTPATIENT PHYSICAL THERAPY THORACOLUMBAR TREATMENT   Patient Name: Austin Bright MRN: [id number] DOB:[birth year], 78 y.o., male Today's Date: 10/19/2023  END OF SESSION:  PT End of Session - 10/19/23 1244     Visit Number 6    Number of Visits 16    Date for Recertification  10/29/23    Authorization Type [employer]    Authorization Time Period cohere covered 13 visits from 10/01/23-12/30/2023    Authorization - Visit Number 6    Authorization - Number of Visits 13    Progress Note Due on Visit 10    PT Start Time 1246    PT Stop Time 1330    PT Time Calculation (min) 44 min    Equipment Utilized During Treatment Gait belt    Activity Tolerance Patient tolerated treatment well    Behavior During Therapy [facility] for tasks assessed/performed           Past Medical History:  Diagnosis Date   Acute diverticulitis 08/17/2020   Acute MI (HCC) 08/2011   Arthritis    Bilateral pneumonia    Diagnosed after STEMI 08/2011   CAD (coronary artery disease)    a. Diagnosed 08/2011 with anterior STEMI due to thrombotic occlusion of mid LAD s/p thrombectomy, PTCA, DES placement 08/23/11.   CHF (congestive heart failure) (HCC)    Chronic combined systolic and diastolic congestive heart failure (HCC) 09/15/2011   Dyslipidemia    Gout    Hypertension    Ischemic cardiomyopathy    a. Initial EF 35% by cath 08/23/11, improved to 40-45% by echo 08/25/11. 50-55% by echo November 2013.   Rotator cuff tear arthropathy, right    Shortness of breath    Past Surgical History:  Procedure Laterality Date   carpel tunnel Bilateral 2002   COLONOSCOPY WITH PROPOFOL  N/A 04/08/2017   Surgeon: [name], MD;  diverticulosis in sigmoid and descending colon, internal hemorrhoids, otherwise normal exam.  No recommendations to repeat due to age.   COLONOSCOPY WITH PROPOFOL  N/A 03/17/2021   Procedure: COLONOSCOPY WITH PROPOFOL ;  Surgeon: [name], DO;  Location: [facility];  Service:  Endoscopy;  Laterality: N/A;  8:00am   CORONARY STENT PLACEMENT     CYSTECTOMY  1969   pilonidal cyst   LEFT AND RIGHT HEART CATHETERIZATION WITH CORONARY ANGIOGRAM N/A 09/15/2011   Procedure: LEFT AND RIGHT HEART CATHETERIZATION WITH CORONARY ANGIOGRAM;  Surgeon: [name], MD;  Location: [facility] CATH LAB;  Service: Cardiovascular;  Laterality: N/A;   LEFT HEART CATH N/A 08/23/2011   Procedure: LEFT HEART CATH;  Surgeon: [name], MD;  Location: [facility] CATH LAB;  Service: Cardiovascular;  Laterality: N/A;   NO PAST SURGERIES     PERCUTANEOUS CORONARY STENT INTERVENTION (PCI-S)  08/23/2011   Procedure: PERCUTANEOUS CORONARY STENT INTERVENTION (PCI-S);  Surgeon: [name], MD;  Location: [facility] CATH LAB;  Service: Cardiovascular;;   Patient Active Problem List   Diagnosis Date Noted   Carbuncle 10/07/2023   Hospital discharge follow-up 10/07/2023   Abnormal LFTs 09/26/2023   Cauda equina compression (HCC) 09/14/2023   Cauda equina syndrome with neurogenic bladder (HCC) 09/12/2023   Lumbar spinal stenosis 09/11/2023   Tendinopathy of right rotator cuff 02/03/2023   Mixed hyperlipidemia 01/23/2022   Encounter for general adult medical examination with abnormal findings 07/18/2021   Prediabetes 07/18/2021   Vitamin D  deficiency 07/18/2021   History of diverticulitis 02/24/2021   Pancreatic lesion 02/24/2021   Prostate cancer screening 01/30/2021  Primary insomnia 01/27/2021   Hepatomegaly    Essential hypertension 08/18/2020   Idiopathic gout 11/16/2018   Neck mass 12/31/2015   Old anteroseptal myocardial infarction 08/24/2011   Coronary artery disease involving native coronary artery of native heart without angina pectoris 08/24/2011    PCP: [name], MD  REFERRING PROVIDER: Maurice Sharlet RAMAN, PA-C  REFERRING DIAG:  G83.4 (ICD-10-CM) - Cauda equina compression [facility])    Rationale for Evaluation and Treatment: Rehabilitation  THERAPY DIAG:  Muscle  weakness  Impaired functional mobility, balance, gait, and endurance  Cauda equina compression (HCC)  ONSET DATE: September 09, 2023  SUBJECTIVE:                                                                                                                                                                                           SUBJECTIVE STATEMENT: Pt arrived ambulating with walker today, stated he got 3 hours of sleep last night and feels weak.  Has been practicing with cane at home.  NO pain or recent falls.     EVAL: Pt reports he was pain free until August 21st. Reports he woke up the next day with low back and LE pain. Reports he went to ER but they sent him home. N/T started a few days after that and he went back to ER, they sent him to [facility] and he had surgery the next day. Reports he had inpatient rehab for 1 week post-op. Pt reports he had a follow up this morning. Doctor pleased with progress.   PERTINENT HISTORY:  Lumbar decompression, fusion, L4-5 on 8/23 R shoulder Rotator cuff pain  PAIN:  Are you having pain? No  PRECAUTIONS: Back, Pt wearing back brace, Reports under BLT precautions (bending, lifting, twisting)  - Plans to follow up with MD regarding timeline of precautions   RED FLAGS: Reports cont N/T down both LE, most in buttocks    WEIGHT BEARING RESTRICTIONS: No  FALLS:  Has patient fallen in last 6 months? No  LIVING ENVIRONMENT: Lives with: lives with their spouse Lives in: House/apartment Stairs: Yes: Internal: 13 steps; can reach both and External: 2 steps; none Has following equipment at home: Vannie - 2 wheeled and Shower bench, Back brace  OCCUPATION: N/A  PLOF: Reports bathing at sink, independent with dressing, reports wife doing all cleaning now, can prepare own meals   PATIENT GOALS: For 100% recovery, mobile, active, golf (last played last year)  NEXT MD VISIT: October 17th  OBJECTIVE:  Note: Objective measures were completed at  Evaluation unless otherwise noted.  DIAGNOSTIC FINDINGS:  IMPRESSION: 1. Bulky disc extrusion at L4-L5 with  very Severe spinal stenosis. Lateral recess stenosis may be greater on the left. Query L5 radiculitis. 2. Superimposed multifactorial Severe spinal stenosis also at L3-L4, and moderate spinal stenosis at L2-L3. Up to moderate bilateral L2 neural foraminal stenosis.  IMPRESSION: Intraoperative L4-L5 posterolateral and interbody surgical hardware.    PATIENT SURVEYS:  Modified Oswestry: Modified Oswestry Low Back Pain Disability Questionnaire: 20 / 50 = 40.0 %  Interpretation of scores: Score Category Description  0-20% Minimal Disability The patient can cope with most living activities. Usually no treatment is indicated apart from advice on lifting, sitting and exercise  21-40% Moderate Disability The patient experiences more pain and difficulty with sitting, lifting and standing. Travel and social life are more difficult and they may be disabled from work. Personal care, sexual activity and sleeping are not grossly affected, and the patient can usually be managed by conservative means  41-60% Severe Disability Pain remains the main problem in this group, but activities of daily living are affected. These patients require a detailed investigation  61-80% Crippled Back pain impinges on all aspects of the patient's life. Positive intervention is required  81-100% Bed-bound  These patients are either bed-bound or exaggerating their symptoms  Bluford FORBES Zoe DELENA Karon DELENA, et al. Surgery versus conservative management of stable thoracolumbar fracture: the PRESTO feasibility RCT. [place]): [employer]; 2021 Nov. [facility] Technology Assessment, No. 25.62.) Appendix 3, Oswestry Disability Index category descriptors. Available from: [employer]el[employer]cz  Minimally Clinically Important Difference (MCID) = 12.8% LEFS  Extreme difficulty/unable (0),  Quite a bit of difficulty (1), Moderate difficulty (2), Little difficulty (3), No difficulty (4) Survey date:    Any of your usual work, housework or school activities   2. Usual hobbies, recreational or sporting activities   3. Getting into/out of the bath   4. Walking between rooms   5. Putting on socks/shoes   6. Squatting    7. Lifting an object, like a bag of groceries from the floor   8. Performing light activities around your home   9. Performing heavy activities around your home   10. Getting into/out of a car   11. Walking 2 blocks   12. Walking 1 mile   13. Going up/down 10 stairs (1 flight)   14. Standing for 1 hour   15.  sitting for 1 hour   16. Running on even ground   17. Running on uneven ground   18. Making sharp turns while running fast   19. Hopping    20. Rolling over in bed   Score total:  Lower Extremity Functional Score: 45 / 80 = 56.3 %      COGNITION: Overall cognitive status: Within functional limits for tasks assessed     SENSATION: WFL,   POSTURE: rounded shoulders and forward head  PALPATION:  N/A  LUMBAR ROM:   AROM eval  Flexion   Extension   Right lateral flexion   Left lateral flexion   Right rotation   Left rotation    (Blank rows = not tested)  LOWER EXTREMITY ROM:     Active  Right eval Left eval  Hip flexion    Hip extension    Hip abduction    Hip adduction    Hip internal rotation    Hip external rotation    Knee flexion    Knee extension    Ankle dorsiflexion    Ankle plantarflexion    Ankle inversion    Ankle eversion     (  Blank rows = not tested)  LOWER EXTREMITY MMT:    MMT Right eval Left eval  Hip flexion 4 4  Hip extension 2+ 2+  Hip abduction 3 3-  Hip adduction    Hip internal rotation    Hip external rotation    Knee flexion    Knee extension    Ankle dorsiflexion 5 5  Ankle plantarflexion    Ankle inversion    Ankle eversion     (Blank rows = not tested)  LUMBAR SPECIAL TESTS:     FUNCTIONAL TESTS:  30 seconds chair stand test 2 minute walk test: 321 ft on 10/05/23 30 second chair stand: 6 STS, no UE use  Tandem balance: 26 with RLE leading, 25 with LLE leading SLS: unable with BLE  GAIT: Distance walked: 100 ft in session  Assistive device utilized: Walker - 2 wheeled Level of assistance: Modified independence Comments: Dec speed, ambulates with RW, back brace donned, slight forward posture,   TREATMENT DATE:  10/19/23: QC 239ft good sequence - Toe tapping 6in step height - Lateral toe tapping between 6 and 8in step height 2x 10 intermittent HHA - STS no HHA 10x - Squat front of chair  - SLS Rt 10; Lt 6  - Tandem stance 2x 30; Foam 2x 30 - Vector stance with 1 UE support 3x 5 holds - 6in hurdles forward and lateral step over 3RT intermittent HHA required during Lt LE weight bearing  10/15/23: STS from chair, no UE support, 2x12 Side steps in // bars, BTB at knees, 5x down and back, last 2 without UE support Ambulating in // bars, RUE support only, 5 x down and back Ambulating with quad cane, 20 ftx4, CGA for safety  Ambulating 133 ft with quad cane, CGA Heel raises, 2x12, two finger support of one UE Tandem ambulation in // bars, 3x down and back, intermittent UE use to catch SLS: 5 on R, 2 on L  Static SLS holds, 30 holds Nustep seat 10 x 5' level 3    10/12/23 Nustep seat 10 x 5' level 3 dynamic warm up // bars walking without UE assist down and back x 3, CGA Heel raises on incline x 10 Toe raises on decline x 10 Standing No UE assist x 30 sec Head turns x 6 Head nods x 6 Eyes closed x 30 Tandem stance x 30  each way Step navigation reciprocal pattern using bilateral hand rails up and down x 3; 7 steps Sidestepping in // bars no UE assist   10/08/2023  Therapeutic Exercise: -Nustep, level 3 resistance, 5 minutes, pt cued for increased pace 95-97 SPM -Hip marches with dorsiflexion pickup with 5lb kettle bell, 1 set  of 7 reps bilaterally -Supine bridges, GTB at knees 2 sets of 7 reps, 3 second holds, symptomatic, pt cued for max hip extension -Standing 3 way hip 1 sets 5 reps, GTB at ankles, bilaterally, pt cued for upright trunk and maintaining of neutral spine -Lateral stepping 2 laps 10 feet per lap, with GTB around ankles, pt cued for upright posture -Modified Crunch, 1 set of 10 reps, 3 seconds, pt cued for proper set up and sequencing  Therapeutic Activity: -Sit to stands with 5lb kettle bell at chest, 2 sets of 8 reps, pt cued for core activation, 2nd set with LLE further underneath COM -Step up and overs, 8 inch step, 1 set of 4 reps, pt cued for one UE support, pt demonstrates one LOB, therapist assist for  correction -Lateral step up and overs, 8 inch step, 1 set of 5 reps, pt requires one UE support    PATIENT EDUCATION:  Education details: PT evaluation, objective findings, POC, Importance of HEP, Precautions, Clinic policies  Person educated: Patient Education method: Explanation and Demonstration Education comprehension: verbalized understanding and returned demonstration  HOME EXERCISE PROGRAM: Access Code: [email or web address] URL: [email or web address] Date: 10/15/2023 Prepared by: Rosaria Powell-Butler  Exercises - Clamshell  - 2 x daily - 7 x weekly - 3 sets - 10 reps - Supine Bridge  - 2 x daily - 7 x weekly - 3 sets - 10 reps - Seated Long Arc Quad  - 2 x daily - 7 x weekly - 3 sets - 10 reps - Sit to Stand  - 2 x daily - 7 x weekly - 3 sets - 10 reps - Standing Tandem Balance with Counter Support  - 2 x daily - 7 x weekly - 3 sets - 10 reps - 30 hold - Neutral Curl Up with Straight Leg  - 1 x daily - 7 x weekly - 3 sets - 10 reps - 3 hold - Standing Single Leg Stance with Counter Support  - 2 x daily - 7 x weekly - 3 sets - 30 hold - Tandem Walking with Counter Support  - 2 x daily - 7 x weekly - 2 sets - 10 reps   ASSESSMENT:  CLINICAL IMPRESSION: Pt reports increased  fatigue at entrance, stated he had slept 3 hours last night.  Monitored fatigue through session with frequent seated rest breaks required.  Began session with SBQC with good sequence and no LOB, able to ambulate 248ft.  Session focus with LE strengthening and balance activities to improve Lt LE weight bearing.  Added squats for functional strengthening with good form and mechanics noted.  Continued SLS based exercises including vector stance, hurdles and SLS, increased difficulty with Lt LE required intermittent HHA.  Did progress to dynamic surface during tandem stance with intermittent HHA.  No reports of pain through session, was limited by fatigue with activities.    EVAL:  Patient is a 78 y.o. male who was seen today for physical therapy evaluation and treatment for  G83.4 (ICD-10-CM) - Cauda equina compression (HCC)  . On this date, patient demonstrates decreased self perceived function via Modified oswestry as well as LEFS, decreased LE strength, impaired balance, altered gait, and decreased activity tolerance all which may be contributing to patients impaired overall function. Patient reports he's maintaining his no lifting,bending, twisting precautions and wearing brace when OOB. Will follow up with surgeon regarding timeline of precautions and brace wear. Patient will benefit from continued skilled physical therapy in order to address the above/below to improve current level of function for better quality of life.   OBJECTIVE IMPAIRMENTS: Abnormal gait, decreased activity tolerance, decreased balance, decreased endurance, decreased mobility, difficulty walking, decreased ROM, decreased strength, and postural dysfunction.   ACTIVITY LIMITATIONS: lifting, bending, standing, squatting, sleeping, stairs, and transfers  PARTICIPATION LIMITATIONS: meal prep, cleaning, laundry, driving, community activity, and yard work  PERSONAL FACTORS: N/A are also affecting patient's functional outcome.    REHAB POTENTIAL: Good  CLINICAL DECISION MAKING: Stable/uncomplicated  EVALUATION COMPLEXITY: Low   GOALS: Goals reviewed with patient? Yes  SHORT TERM GOALS: Target date: 10/22/23 Patient will be independent with performance of HEP to demonstrate adequate self management of symptoms.  Baseline:  Goal status: INITIAL  2.   Patient will report at least a 25% improvement  with function and/or pain reduction overall since beginning PT. Baseline:  Goal status: INITIAL   LONG TERM GOALS: Target date: 11/11/23 Patient will improve LEFS score by 9 points to demonstrate improved perceived function while meeting MCID.  Baseline: Goal status: INITIAL Patient will improve Modified Oswestry score by 12.8% to demonstrate improved perceived function while meeting MCID.  Baseline: Goal status: INITIAL 3.  Patient will score at least a  4/5  on hip extension and abduction MMT with BLE to demonstrate increased LE strength and/or power needed to improve ambulation/gait mechanics.  Baseline:  Goal status: INITIAL   4.  Patient will increase 2 minute walk test gait distance by at least 40 ft with least restrictive assistive device to demonstrate improved endurance and functional mobility needed for ambulating household and community distances.  Baseline: Goal status: INITIAL 5. Patient will be able to maintain SL balance on each LE for at least 5 to demonstrate improved balance needed to reduce risk of falls.  Baseline: Goal status: INITIAL   PLAN:  PT FREQUENCY: 2x/week  PT DURATION: 8 weeks  PLANNED INTERVENTIONS: 97164- PT Re-evaluation, 97110-Therapeutic exercises, 97530- Therapeutic activity, [id number]- Neuromuscular re-education, 97535- Self Care, [id number]- Manual therapy, [id number]- Gait training, [phone]- Electrical stimulation (manual), [id number]- Traction (mechanical), [phone] (1-2 muscles), 20561 (3+ muscles)- Dry Needling, Patient/Family education, Balance training, Stair training, Taping, Joint  mobilization, Spinal mobilization, Cryotherapy, and Moist heat.  PLAN FOR NEXT SESSION: progress LE strengthening, balance, reduce AD as appropriate - SPC in session  [name] Mclean, LPTA/CLT; CBIS [phone]  1:43 PM, 10/19/23

## 2023-10-20 ENCOUNTER — Other Ambulatory Visit: Payer: Self-pay | Admitting: Internal Medicine

## 2023-10-20 ENCOUNTER — Encounter: Payer: Self-pay | Admitting: Internal Medicine

## 2023-10-20 DIAGNOSIS — S81002D Unspecified open wound, left knee, subsequent encounter: Secondary | ICD-10-CM

## 2023-10-20 DIAGNOSIS — L0293 Carbuncle, unspecified: Secondary | ICD-10-CM

## 2023-10-20 MED ORDER — CEPHALEXIN 500 MG PO CAPS: 500.0000 mg | ORAL_CAPSULE | Freq: Three times a day (TID) | ORAL | 0 refills | Status: DC

## 2023-10-21 ENCOUNTER — Other Ambulatory Visit: Payer: Self-pay

## 2023-10-21 ENCOUNTER — Ambulatory Visit: Admitting: Physician Assistant

## 2023-10-21 ENCOUNTER — Encounter: Payer: Self-pay | Admitting: Physician Assistant

## 2023-10-21 DIAGNOSIS — M25562 Pain in left knee: Secondary | ICD-10-CM

## 2023-10-21 DIAGNOSIS — T148XXA Other injury of unspecified body region, initial encounter: Secondary | ICD-10-CM | POA: Diagnosis not present

## 2023-10-21 MED ORDER — COLCHICINE 0.6 MG PO TABS: 0.6000 mg | ORAL_TABLET | Freq: Every day | ORAL | 3 refills | Status: DC

## 2023-10-21 NOTE — Progress Notes (Addendum)
 Office Visit Note   Patient: Austin Bright           Date of Birth: March 05, 1945           MRN: [id number] Visit Date: 10/21/2023              Requested by: [name], MD [address] [place],  [place] [address] PCP: [name], MD  Chief Complaint  Patient presents with   Left Knee - Wound Check      HPI: 78 y/o male with recent L4-5 decompression and fusion by Dr. Mavis on 09/11/2023.  This was done for cauda equina syndrome.  He reports redness and swelling over medial side of the left knee for the last 1 week.  He noticed it as a pimple initially after pulling possible ingrown hair, and has been increasing in size.  He has noticed serosanguineous discharge at times.  Denies any fever or chills.  He has been on  3 different oral antibiotics last one is Bactrim .  He states the area was cellulitic and now the erythema has subsided. since 10/07/23.  Labs on 09/23/23 show CKD, low albumin/protein, history of gout.  He had a negative DVT study on 09/15/23.    Assessment & Plan: Visit Diagnoses:  1. Open wound   2. Acute pain of left knee     Plan: Uric acid blood work drawn today.  He will do wet to dry Vashe dressings daily and use an ace wrap to prevent skin irritation.  Activity as tolerates.  Work up for gout secondary to history of gout and gout can cause open wounds when tophi, or deposits of uric acid crystals, break through the skin, leading to chronic, non-healing ulcers.  Uric acid was reported as 5.7.  Follow-Up Instructions: Return in about 2 weeks (around 11/04/2023).   Ortho Exam  Patient is alert, oriented, no adenopathy, well-dressed, normal affect, normal respiratory effort. Left medial knee open wound with fat necrosis.  Debridement of the wound with pick ups and scissors was performed to healthy tissue.  Mild skin changes due to skin irritation of the the surrounding tissue with tape use.  Wound is 2 cm with depth of 1 cm.    X ray of the knee shows  minimal medial joint line narrowing, no fracture or foreign body. No  punched-out erosions of the bone near the open wound.  Imaging:   Labs: Lab Results  Component Value Date   HGBA1C 6.2 (H) 08/02/2023   HGBA1C 5.9 (H) 08/21/2022   HGBA1C 6.1 (H) 01/23/2022   LABURIC 5.7 10/21/2023   LABURIC 4.7 01/23/2022   REPTSTATUS 08/23/2020 FINAL 08/17/2020   CULT  08/17/2020    NO GROWTH 5 DAYS Performed at [facility], [address] [place], [place] [address]      Lab Results  Component Value Date   ALBUMIN CANCELED 10/07/2023   ALBUMIN 3.1 (L) 09/23/2023   ALBUMIN 3.1 (L) 09/20/2023    Lab Results  Component Value Date   MG 1.9 08/18/2020   MG 2.1 09/02/2016   MG 2.1 09/15/2011   Lab Results  Component Value Date   VD25OH 32.8 08/02/2023   VD25OH 43.8 08/21/2022   VD25OH 24.7 (L) 07/16/2021    No results found for: PREALBUMIN    Latest Ref Rng & Units 10/07/2023   11:46 AM 09/23/2023    5:23 AM 09/20/2023    5:28 AM  CBC EXTENDED  WBC 3.4 - 10.8  x10E3/uL 8.4  8.1  8.9   RBC 4.14 - 5.80 x10E6/uL 4.45  4.03  4.25   Hemoglobin 13.0 - 17.7 g/dL 85.9  86.9  86.2   HCT 37.5 - 51.0 % 43.0  36.9  38.6   Platelets 150 - 450 x10E3/uL 195  182  204   NEUT# 1.4 - 7.0 x10E3/uL 6.0     Lymph# 0.7 - 3.1 x10E3/uL 1.5        There is no height or weight on file to calculate BMI.  Orders:  Orders Placed This Encounter  Procedures   XR Knee 1-2 Views Left   Uric acid   Meds ordered this encounter  Medications   colchicine 0.6 MG tablet    Sig: Take 1 tablet (0.6 mg total) by mouth daily.    Dispense:  30 tablet    Refill:  3    Supervising Provider:   DUDA, MARCUS V [[id number]]     Procedures: No procedures performed  Clinical Data: No additional findings.  ROS:  All other systems negative, except as noted in the HPI. Review of Systems  Objective: Vital Signs: There were no vitals taken for this visit.  Specialty Comments:  No specialty comments  available.  PMFS History: Patient Active Problem List   Diagnosis Date Noted   Carbuncle 10/07/2023   Hospital discharge follow-up 10/07/2023   Abnormal LFTs 09/26/2023   Cauda equina compression (HCC) 09/14/2023   Cauda equina syndrome with neurogenic bladder (HCC) 09/12/2023   Lumbar spinal stenosis 09/11/2023   Tendinopathy of right rotator cuff 02/03/2023   Mixed hyperlipidemia 01/23/2022   Encounter for general adult medical examination with abnormal findings 07/18/2021   Prediabetes 07/18/2021   Vitamin D  deficiency 07/18/2021   History of diverticulitis 02/24/2021   Pancreatic lesion 02/24/2021   Prostate cancer screening 01/30/2021   Primary insomnia 01/27/2021   Hepatomegaly    Essential hypertension 08/18/2020   Idiopathic gout 11/16/2018   Neck mass 12/31/2015   Old anteroseptal myocardial infarction 08/24/2011   Coronary artery disease involving native coronary artery of native heart without angina pectoris 08/24/2011   Past Medical History:  Diagnosis Date   Acute diverticulitis 08/17/2020   Acute MI (HCC) 08/2011   Arthritis    Bilateral pneumonia    Diagnosed after STEMI 08/2011   CAD (coronary artery disease)    a. Diagnosed 08/2011 with anterior STEMI due to thrombotic occlusion of mid LAD s/p thrombectomy, PTCA, DES placement 08/23/11.   CHF (congestive heart failure) (HCC)    Chronic combined systolic and diastolic congestive heart failure (HCC) 09/15/2011   Dyslipidemia    Gout    Hypertension    Ischemic cardiomyopathy    a. Initial EF 35% by cath 08/23/11, improved to 40-45% by echo 08/25/11. 50-55% by echo November 2013.   Rotator cuff tear arthropathy, right    Shortness of breath     Family History  Problem Relation Age of Onset   Stroke Mother    Hypertension Father    Colon cancer Neg Hx    Pancreatic cancer Neg Hx     Past Surgical History:  Procedure Laterality Date   carpel tunnel Bilateral 2002   COLONOSCOPY WITH PROPOFOL  N/A 04/08/2017    Surgeon: [name], MD;  diverticulosis in sigmoid and descending colon, internal hemorrhoids, otherwise normal exam.  No recommendations to repeat due to age.   COLONOSCOPY WITH PROPOFOL  N/A 03/17/2021   Procedure: COLONOSCOPY WITH PROPOFOL ;  Surgeon: [name], DO;  Location: [facility];  Service: Endoscopy;  Laterality: N/A;  8:00am   CORONARY STENT PLACEMENT     CYSTECTOMY  1969   pilonidal cyst   LEFT AND RIGHT HEART CATHETERIZATION WITH CORONARY ANGIOGRAM N/A 09/15/2011   Procedure: LEFT AND RIGHT HEART CATHETERIZATION WITH CORONARY ANGIOGRAM;  Surgeon: [name], MD;  Location: [facility] CATH LAB;  Service: Cardiovascular;  Laterality: N/A;   LEFT HEART CATH N/A 08/23/2011   Procedure: LEFT HEART CATH;  Surgeon: [name], MD;  Location: [facility] CATH LAB;  Service: Cardiovascular;  Laterality: N/A;   NO PAST SURGERIES     PERCUTANEOUS CORONARY STENT INTERVENTION (PCI-S)  08/23/2011   Procedure: PERCUTANEOUS CORONARY STENT INTERVENTION (PCI-S);  Surgeon: [name], MD;  Location: [facility] CATH LAB;  Service: Cardiovascular;;   Social History   Occupational History   Occupation: Child psychotherapist  Tobacco Use   Smoking status: Former    Current packs/day: 0.00    Average packs/day: 1 pack/day for 25.0 years (25.0 ttl pk-yrs)    Types: Cigarettes    Start date: 08/23/1954    Quit date: 08/23/1979    Years since quitting: 44.2    Passive exposure: Past   Smokeless tobacco: Never  Vaping Use   Vaping status: Some Days   Substances: Nicotine  Substance and Sexual Activity   Alcohol use: Yes    Alcohol/week: 4.0 standard drinks of alcohol    Types: 2 Cans of beer, 2 Shots of liquor per week    Comment: 1-2 drinks on the weekends   Drug use: No   Sexual activity: Yes

## 2023-10-22 ENCOUNTER — Ambulatory Visit (HOSPITAL_COMMUNITY): Attending: Physical Medicine and Rehabilitation

## 2023-10-22 ENCOUNTER — Encounter (HOSPITAL_COMMUNITY): Payer: Self-pay

## 2023-10-22 DIAGNOSIS — Z7409 Other reduced mobility: Secondary | ICD-10-CM | POA: Diagnosis not present

## 2023-10-22 DIAGNOSIS — M6281 Muscle weakness (generalized): Secondary | ICD-10-CM | POA: Diagnosis not present

## 2023-10-22 DIAGNOSIS — G834 Cauda equina syndrome: Secondary | ICD-10-CM | POA: Insufficient documentation

## 2023-10-22 LAB — URIC ACID: Uric Acid, Serum: 5.7 mg/dL (ref 4.0–8.0)

## 2023-10-22 NOTE — Therapy (Signed)
 OUTPATIENT PHYSICAL THERAPY THORACOLUMBAR TREATMENT   Patient Name: Austin Bright MRN: [id number] DOB:January 26, 1945, 78 y.o., male Today's Date: 10/22/2023  END OF SESSION:  PT End of Session - 10/22/23 1238     Visit Number 7    Number of Visits 16    Date for Recertification  10/29/23    Authorization Type [employer]    Authorization Time Period cohere covered 13 visits from 10/01/23-12/30/2023    Authorization - Visit Number 7    Authorization - Number of Visits 13    Progress Note Due on Visit 10    PT Start Time 1246    PT Stop Time 1325    PT Time Calculation (min) 39 min    Equipment Utilized During Treatment --   SBA, // bars, QC/SPC   Activity Tolerance Patient tolerated treatment well    Behavior During Therapy [facility] for tasks assessed/performed           Past Medical History:  Diagnosis Date   Acute diverticulitis 08/17/2020   Acute MI (HCC) 08/2011   Arthritis    Bilateral pneumonia    Diagnosed after STEMI 08/2011   CAD (coronary artery disease)    a. Diagnosed 08/2011 with anterior STEMI due to thrombotic occlusion of mid LAD s/p thrombectomy, PTCA, DES placement 08/23/11.   CHF (congestive heart failure) (HCC)    Chronic combined systolic and diastolic congestive heart failure (HCC) 09/15/2011   Dyslipidemia    Gout    Hypertension    Ischemic cardiomyopathy    a. Initial EF 35% by cath 08/23/11, improved to 40-45% by echo 08/25/11. 50-55% by echo November 2013.   Rotator cuff tear arthropathy, right    Shortness of breath    Past Surgical History:  Procedure Laterality Date   carpel tunnel Bilateral 2002   COLONOSCOPY WITH PROPOFOL  N/A 04/08/2017   Surgeon: [name], MD;  diverticulosis in sigmoid and descending colon, internal hemorrhoids, otherwise normal exam.  No recommendations to repeat due to age.   COLONOSCOPY WITH PROPOFOL  N/A 03/17/2021   Procedure: COLONOSCOPY WITH PROPOFOL ;  Surgeon: [name], DO;  Location[facility];  Service: Endoscopy;  Laterality: N/A;  8:00am   CORONARY STENT PLACEMENT     CYSTECTOMY  1969   pilonidal cyst   LEFT AND RIGHT HEART CATHETERIZATION WITH CORONARY ANGIOGRAM N/A 09/15/2011   Procedure: LEFT AND RIGHT HEART CATHETERIZATION WITH CORONARY ANGIOGRAM;  Surgeon: [name], MD;  Location: [facility] CATH LAB;  Service: Cardiovascular;  Laterality: N/A;   LEFT HEART CATH N/A 08/23/2011   Procedure: LEFT HEART CATH;  Surgeon: [name], MD;  Location: [facility] CATH LAB;  Service: Cardiovascular;  Laterality: N/A;   NO PAST SURGERIES     PERCUTANEOUS CORONARY STENT INTERVENTION (PCI-S)  08/23/2011   Procedure: PERCUTANEOUS CORONARY STENT INTERVENTION (PCI-S);  Surgeon: [name], MD;  Location: [facility] CATH LAB;  Service: Cardiovascular;;   Patient Active Problem List   Diagnosis Date Noted   Carbuncle 10/07/2023   Hospital discharge follow-up 10/07/2023   Abnormal LFTs 09/26/2023   Cauda equina compression (HCC) 09/14/2023   Cauda equina syndrome with neurogenic bladder (HCC) 09/12/2023   Lumbar spinal stenosis 09/11/2023   Tendinopathy of right rotator cuff 02/03/2023   Mixed hyperlipidemia 01/23/2022   Encounter for general adult medical examination with abnormal findings 07/18/2021   Prediabetes 07/18/2021   Vitamin D  deficiency 07/18/2021   History of diverticulitis 02/24/2021   Pancreatic lesion 02/24/2021  Prostate cancer screening 01/30/2021   Primary insomnia 01/27/2021   Hepatomegaly    Essential hypertension 08/18/2020   Idiopathic gout 11/16/2018   Neck mass 12/31/2015   Old anteroseptal myocardial infarction 08/24/2011   Coronary artery disease involving native coronary artery of native heart without angina pectoris 08/24/2011    PCP: [name], MD  REFERRING PROVIDER: Maurice Sharlet RAMAN, PA-C  REFERRING DIAG:  G83.4 (ICD-10-CM) - Cauda equina compression [facility])    Rationale for Evaluation and Treatment: Rehabilitation  THERAPY DIAG:   Muscle weakness  Impaired functional mobility, balance, gait, and endurance  Cauda equina compression [facility])  ONSET DATE: September 09, 2023  SUBJECTIVE:                                                                                                                                                                                           SUBJECTIVE STATEMENT: Pt arrived with [facility] and wife, reports he is feeling good today.  No reports of pain currently.  Would like to work on balance today.   EVAL: Pt reports he was pain free until August 21st. Reports he woke up the next day with low back and LE pain. Reports he went to ER but they sent him home. N/T started a few days after that and he went back to ER, they sent him to [facility] and he had surgery the next day. Reports he had inpatient rehab for 1 week post-op. Pt reports he had a follow up this morning. Doctor pleased with progress.   PERTINENT HISTORY:  Lumbar decompression, fusion, L4-5 on 8/23 R shoulder Rotator cuff pain  PAIN:  Are you having pain? No  PRECAUTIONS: Back, Pt wearing back brace, Reports under BLT precautions (bending, lifting, twisting)  - Plans to follow up with MD regarding timeline of precautions   RED FLAGS: Reports cont N/T down both LE, most in buttocks    WEIGHT BEARING RESTRICTIONS: No  FALLS:  Has patient fallen in last 6 months? No  LIVING ENVIRONMENT: Lives with: lives with their spouse Lives in: House/apartment Stairs: Yes: Internal: 13 steps; can reach both and External: 2 steps; none Has following equipment at home: Vannie - 2 wheeled and Shower bench, Back brace  OCCUPATION: N/A  PLOF: Reports bathing at sink, independent with dressing, reports wife doing all cleaning now, can prepare own meals   PATIENT GOALS: For 100% recovery, mobile, active, golf (last played last year)  NEXT MD VISIT: October 17th  OBJECTIVE:  Note: Objective measures were completed at Evaluation unless otherwise  noted.  DIAGNOSTIC FINDINGS:  IMPRESSION: 1. Bulky disc extrusion at L4-L5 with very Severe  spinal stenosis. Lateral recess stenosis may be greater on the left. Query L5 radiculitis. 2. Superimposed multifactorial Severe spinal stenosis also at L3-L4, and moderate spinal stenosis at L2-L3. Up to moderate bilateral L2 neural foraminal stenosis.  IMPRESSION: Intraoperative L4-L5 posterolateral and interbody surgical hardware.    PATIENT SURVEYS:  Modified Oswestry: Modified Oswestry Low Back Pain Disability Questionnaire: 20 / 50 = 40.0 %  Interpretation of scores: Score Category Description  0-20% Minimal Disability The patient can cope with most living activities. Usually no treatment is indicated apart from advice on lifting, sitting and exercise  21-40% Moderate Disability The patient experiences more pain and difficulty with sitting, lifting and standing. Travel and social life are more difficult and they may be disabled from work. Personal care, sexual activity and sleeping are not grossly affected, and the patient can usually be managed by conservative means  41-60% Severe Disability Pain remains the main problem in this group, but activities of daily living are affected. These patients require a detailed investigation  61-80% Crippled Back pain impinges on all aspects of the patient's life. Positive intervention is required  81-100% Bed-bound  These patients are either bed-bound or exaggerating their symptoms  Bluford FORBES Zoe DELENA Karon DELENA, et al. Surgery versus conservative management of stable thoracolumbar fracture: the PRESTO feasibility RCT. [place] [employer]; 2021 Nov. [place] [employer] Assessment, No. 25.62.) Appendix 3, Oswestry Disability Index category descriptors. Available from: FindJewel[employer]cz  Minimally Clinically Important Difference (MCID) = 12.8% LEFS  Extreme difficulty/unable (0), Quite a bit of difficulty  (1), Moderate difficulty (2), Little difficulty (3), No difficulty (4) Survey date:    Any of your usual work, housework or school activities   2. Usual hobbies, recreational or sporting activities   3. Getting into/out of the bath   4. Walking between rooms   5. Putting on socks/shoes   6. Squatting    7. Lifting an object, like a bag of groceries from the floor   8. Performing light activities around your home   9. Performing heavy activities around your home   10. Getting into/out of a car   11. Walking 2 blocks   12. Walking 1 mile   13. Going up/down 10 stairs (1 flight)   14. Standing for 1 hour   15.  sitting for 1 hour   16. Running on even ground   17. Running on uneven ground   18. Making sharp turns while running fast   19. Hopping    20. Rolling over in bed   Score total:  Lower Extremity Functional Score: 45 / 80 = 56.3 %      COGNITION: Overall cognitive status: Within functional limits for tasks assessed     SENSATION: WFL,   POSTURE: rounded shoulders and forward head  PALPATION:  N/A  LUMBAR ROM:   AROM eval  Flexion   Extension   Right lateral flexion   Left lateral flexion   Right rotation   Left rotation    (Blank rows = not tested)  LOWER EXTREMITY ROM:     Active  Right eval Left eval  Hip flexion    Hip extension    Hip abduction    Hip adduction    Hip internal rotation    Hip external rotation    Knee flexion    Knee extension    Ankle dorsiflexion    Ankle plantarflexion    Ankle inversion    Ankle eversion     (Blank  rows = not tested)  LOWER EXTREMITY MMT:    MMT Right eval Left eval  Hip flexion 4 4  Hip extension 2+ 2+  Hip abduction 3 3-  Hip adduction    Hip internal rotation    Hip external rotation    Knee flexion    Knee extension    Ankle dorsiflexion 5 5  Ankle plantarflexion    Ankle inversion    Ankle eversion     (Blank rows = not tested)  LUMBAR SPECIAL TESTS:    FUNCTIONAL TESTS:  30  seconds chair stand test 2 minute walk test: 321 ft on 10/05/23 30 second chair stand: 6 STS, no UE use  Tandem balance: 26 with RLE leading, 25 with LLE leading SLS: unable with BLE  GAIT: Distance walked: 100 ft in session  Assistive device utilized: Walker - 2 wheeled Level of assistance: Modified independence Comments: Dec speed, ambulates with RW, back brace donned, slight forward posture,   TREATMENT DATE:  10/22/23: Nustep UE/LE [place] L5 average speed 81 5 min Standing inside // bars:    - Foam toe tapping 8 then 12in step 2 x10  - Lateral toe tapping between 6 and 8in step height 2x 10 intermittent HHA  - Vector stance with 1 UE support 5x 5 holds  - Squat front of chair 10x  - Tandem stance on foam 2x 30  - Lunge onto 8in step height 10x each no HHA Gait training with SPC, presents with good sequence and equal stride length x 243ft        10/19/23: QC 268ft good sequence - Toe tapping 6in step height - Lateral toe tapping between 6 and 8in step height 2x 10 intermittent HHA - STS no HHA 10x - Squat front of chair  - SLS Rt 10; Lt 6  - Tandem stance 2x 30; Foam 2x 30 - Vector stance with 1 UE support 3x 5 holds - 6in hurdles forward and lateral step over 3RT intermittent HHA required during Lt LE weight bearing  10/15/23: STS from chair, no UE support, 2x12 Side steps in // bars, BTB at knees, 5x down and back, last 2 without UE support Ambulating in // bars, RUE support only, 5 x down and back Ambulating with quad cane, 20 ftx4, CGA for safety  Ambulating 133 ft with quad cane, CGA Heel raises, 2x12, two finger support of one UE Tandem ambulation in // bars, 3x down and back, intermittent UE use to catch SLS: 5 on R, 2 on L  Static SLS holds, 30 holds Nustep seat 10 x 5' level 3    10/12/23 Nustep seat 10 x 5' level 3 dynamic warm up // bars walking without UE assist down and back x 3, CGA Heel raises on incline x 10 Toe raises on  decline x 10 Standing No UE assist x 30 sec Head turns x 6 Head nods x 6 Eyes closed x 30 Tandem stance x 30  each way Step navigation reciprocal pattern using bilateral hand rails up and down x 3; 7 steps Sidestepping in // bars no UE assist   10/08/2023  Therapeutic Exercise: -Nustep, level 3 resistance, 5 minutes, pt cued for increased pace 95-97 SPM -Hip marches with dorsiflexion pickup with 5lb kettle bell, 1 set of 7 reps bilaterally -Supine bridges, GTB at knees 2 sets of 7 reps, 3 second holds, symptomatic, pt cued for max hip extension -Standing 3 way hip 1 sets 5 reps, GTB at ankles,  bilaterally, pt cued for upright trunk and maintaining of neutral spine -Lateral stepping 2 laps 10 feet per lap, with GTB around ankles, pt cued for upright posture -Modified Crunch, 1 set of 10 reps, 3 seconds, pt cued for proper set up and sequencing  Therapeutic Activity: -Sit to stands with 5lb kettle bell at chest, 2 sets of 8 reps, pt cued for core activation, 2nd set with LLE further underneath COM -Step up and overs, 8 inch step, 1 set of 4 reps, pt cued for one UE support, pt demonstrates one LOB, therapist assist for correction -Lateral step up and overs, 8 inch step, 1 set of 5 reps, pt requires one UE support    PATIENT EDUCATION:  Education details: PT evaluation, objective findings, POC, Importance of HEP, Precautions, Clinic policies  Person educated: Patient Education method: Explanation and Demonstration Education comprehension: verbalized understanding and returned demonstration  HOME EXERCISE PROGRAM: Access Code: [email or web address] URL: [email or web address] Date: 10/15/2023 Prepared by: Rosaria Powell-Butler  Exercises - Clamshell  - 2 x daily - 7 x weekly - 3 sets - 10 reps - Supine Bridge  - 2 x daily - 7 x weekly - 3 sets - 10 reps - Seated Long Arc Quad  - 2 x daily - 7 x weekly - 3 sets - 10 reps - Sit to Stand  - 2 x daily - 7 x weekly - 3 sets - 10  reps - Standing Tandem Balance with Counter Support  - 2 x daily - 7 x weekly - 3 sets - 10 reps - 30 hold - Neutral Curl Up with Straight Leg  - 1 x daily - 7 x weekly - 3 sets - 10 reps - 3 hold - Standing Single Leg Stance with Counter Support  - 2 x daily - 7 x weekly - 3 sets - 30 hold - Tandem Walking with Counter Support  - 2 x daily - 7 x weekly - 2 sets - 10 reps   ASSESSMENT:  CLINICAL IMPRESSION: Session focus on proximal strengthening and balance training.  Began gait training with SPC, pt able to demonstrate good sequence and equal stride length with LRAD, no LOB episodes.  Added lunges for functional strengthening with intermittent HHA required.  Pt continues to have difficulty with SLS and dynamic surface.  Will continue to benefit from skilled intervention to address weakness and balance.  No reports of pain through session, was limited by fatigue.   EVAL:  Patient is a 78 y.o. male who was seen today for physical therapy evaluation and treatment for  G83.4 (ICD-10-CM) - Cauda equina compression (HCC)  . On this date, patient demonstrates decreased self perceived function via Modified oswestry as well as LEFS, decreased LE strength, impaired balance, altered gait, and decreased activity tolerance all which may be contributing to patients impaired overall function. Patient reports he's maintaining his no lifting,bending, twisting precautions and wearing brace when OOB. Will follow up with surgeon regarding timeline of precautions and brace wear. Patient will benefit from continued skilled physical therapy in order to address the above/below to improve current level of function for better quality of life.   OBJECTIVE IMPAIRMENTS: Abnormal gait, decreased activity tolerance, decreased balance, decreased endurance, decreased mobility, difficulty walking, decreased ROM, decreased strength, and postural dysfunction.   ACTIVITY LIMITATIONS: lifting, bending, standing, squatting,  sleeping, stairs, and transfers  PARTICIPATION LIMITATIONS: meal prep, cleaning, laundry, driving, community activity, and yard work  PERSONAL FACTORS: N/A are also affecting patient's functional outcome.  REHAB POTENTIAL: Good  CLINICAL DECISION MAKING: Stable/uncomplicated  EVALUATION COMPLEXITY: Low   GOALS: Goals reviewed with patient? Yes  SHORT TERM GOALS: Target date: 10/22/23 Patient will be independent with performance of HEP to demonstrate adequate self management of symptoms.  Baseline:  Goal status: INITIAL  2.   Patient will report at least a 25% improvement with function and/or pain reduction overall since beginning PT. Baseline:  Goal status: INITIAL   LONG TERM GOALS: Target date: 11/11/23 Patient will improve LEFS score by 9 points to demonstrate improved perceived function while meeting MCID.  Baseline: Goal status: INITIAL Patient will improve Modified Oswestry score by 12.8% to demonstrate improved perceived function while meeting MCID.  Baseline: Goal status: INITIAL 3.  Patient will score at least a  4/5  on hip extension and abduction MMT with BLE to demonstrate increased LE strength and/or power needed to improve ambulation/gait mechanics.  Baseline:  Goal status: INITIAL   4.  Patient will increase 2 minute walk test gait distance by at least 40 ft with least restrictive assistive device to demonstrate improved endurance and functional mobility needed for ambulating household and community distances.  Baseline: Goal status: INITIAL 5. Patient will be able to maintain SL balance on each LE for at least 5 to demonstrate improved balance needed to reduce risk of falls.  Baseline: Goal status: INITIAL   PLAN:  PT FREQUENCY: 2x/week  PT DURATION: 8 weeks  PLANNED INTERVENTIONS: 97164- PT Re-evaluation, 97110-Therapeutic exercises, 97530- Therapeutic activity, [id number]- Neuromuscular re-education, 97535- Self Care, [id number]- Manual therapy, [id number]- Gait  training, [phone]- Electrical stimulation (manual), [id number]- Traction (mechanical), [phone] (1-2 muscles), 20561 (3+ muscles)- Dry Needling, Patient/Family education, Balance training, Stair training, Taping, Joint mobilization, Spinal mobilization, Cryotherapy, and Moist heat.  PLAN FOR NEXT SESSION: progress LE strengthening, balance.    [name] Mclean, LPTA/CLT; CBIS [phone]  1:35 PM, 10/22/23

## 2023-10-26 ENCOUNTER — Encounter (HOSPITAL_COMMUNITY): Payer: Self-pay

## 2023-10-26 ENCOUNTER — Ambulatory Visit (HOSPITAL_COMMUNITY)

## 2023-10-26 DIAGNOSIS — Z7409 Other reduced mobility: Secondary | ICD-10-CM

## 2023-10-26 DIAGNOSIS — G834 Cauda equina syndrome: Secondary | ICD-10-CM

## 2023-10-26 DIAGNOSIS — M6281 Muscle weakness (generalized): Secondary | ICD-10-CM | POA: Diagnosis not present

## 2023-10-26 NOTE — Therapy (Signed)
 OUTPATIENT PHYSICAL THERAPY THORACOLUMBAR TREATMENT   Patient Name: Austin Bright MRN: [id number] DOB:26-Jun-1945, 78 y.o., male Today's Date: 10/26/2023  END OF SESSION:  PT End of Session - 10/26/23 1248     Visit Number 8    Number of Visits 16    Date for Recertification  10/29/23    Authorization Type [employer]    Authorization Time Period cohere covered 13 visits from 10/01/23-12/30/2023    Authorization - Visit Number 8    Authorization - Number of Visits 13    Progress Note Due on Visit 10    PT Start Time 1248    PT Stop Time 1328    PT Time Calculation (min) 40 min    Activity Tolerance Patient tolerated treatment well    Behavior During Therapy [facility] for tasks assessed/performed            Past Medical History:  Diagnosis Date   Acute diverticulitis 08/17/2020   Acute MI (HCC) 08/2011   Arthritis    Bilateral pneumonia    Diagnosed after STEMI 08/2011   CAD (coronary artery disease)    a. Diagnosed 08/2011 with anterior STEMI due to thrombotic occlusion of mid LAD s/p thrombectomy, PTCA, DES placement 08/23/11.   CHF (congestive heart failure) (HCC)    Chronic combined systolic and diastolic congestive heart failure (HCC) 09/15/2011   Dyslipidemia    Gout    Hypertension    Ischemic cardiomyopathy    a. Initial EF 35% by cath 08/23/11, improved to 40-45% by echo 08/25/11. 50-55% by echo November 2013.   Rotator cuff tear arthropathy, right    Shortness of breath    Past Surgical History:  Procedure Laterality Date   carpel tunnel Bilateral 2002   COLONOSCOPY WITH PROPOFOL  N/A 04/08/2017   Surgeon: [name], MD;  diverticulosis in sigmoid and descending colon, internal hemorrhoids, otherwise normal exam.  No recommendations to repeat due to age.   COLONOSCOPY WITH PROPOFOL  N/A 03/17/2021   Procedure: COLONOSCOPY WITH PROPOFOL ;  Surgeon: [name], DO;  Location: [facility];  Service: Endoscopy;  Laterality: N/A;  8:00am   CORONARY  STENT PLACEMENT     CYSTECTOMY  1969   pilonidal cyst   LEFT AND RIGHT HEART CATHETERIZATION WITH CORONARY ANGIOGRAM N/A 09/15/2011   Procedure: LEFT AND RIGHT HEART CATHETERIZATION WITH CORONARY ANGIOGRAM;  Surgeon: [name], MD;  Location: [facility] CATH LAB;  Service: Cardiovascular;  Laterality: N/A;   LEFT HEART CATH N/A 08/23/2011   Procedure: LEFT HEART CATH;  Surgeon: [name], MD;  Location: [facility] CATH LAB;  Service: Cardiovascular;  Laterality: N/A;   NO PAST SURGERIES     PERCUTANEOUS CORONARY STENT INTERVENTION (PCI-S)  08/23/2011   Procedure: PERCUTANEOUS CORONARY STENT INTERVENTION (PCI-S);  Surgeon: [name], MD;  Location: [facility] CATH LAB;  Service: Cardiovascular;;   Patient Active Problem List   Diagnosis Date Noted   Carbuncle 10/07/2023   Hospital discharge follow-up 10/07/2023   Abnormal LFTs 09/26/2023   Cauda equina compression (HCC) 09/14/2023   Cauda equina syndrome with neurogenic bladder (HCC) 09/12/2023   Lumbar spinal stenosis 09/11/2023   Tendinopathy of right rotator cuff 02/03/2023   Mixed hyperlipidemia 01/23/2022   Encounter for general adult medical examination with abnormal findings 07/18/2021   Prediabetes 07/18/2021   Vitamin D  deficiency 07/18/2021   History of diverticulitis 02/24/2021   Pancreatic lesion 02/24/2021   Prostate cancer screening 01/30/2021   Primary insomnia 01/27/2021   Hepatomegaly  Essential hypertension 08/18/2020   Idiopathic gout 11/16/2018   Neck mass 12/31/2015   Old anteroseptal myocardial infarction 08/24/2011   Coronary artery disease involving native coronary artery of native heart without angina pectoris 08/24/2011    PCP: [name], MD  REFERRING PROVIDER: Maurice Sharlet RAMAN, PA-C  REFERRING DIAG:  G83.4 (ICD-10-CM) - Cauda equina compression [facility])    Rationale for Evaluation and Treatment: Rehabilitation  THERAPY DIAG:  Muscle weakness  Impaired functional mobility, balance, gait, and  endurance  Cauda equina compression [facility])  ONSET DATE: September 09, 2023  SUBJECTIVE:                                                                                                                                                                                           SUBJECTIVE STATEMENT: Pt states he had quite a test walking to seat for race in [place]. Pt was able to make it back to car but did run out of energy then. Pt reports no falls since last session.     EVAL: Pt reports he was pain free until August 21st. Reports he woke up the next day with low back and LE pain. Reports he went to ER but they sent him home. N/T started a few days after that and he went back to ER, they sent him to [facility] and he had surgery the next day. Reports he had inpatient rehab for 1 week post-op. Pt reports he had a follow up this morning. Doctor pleased with progress.   PERTINENT HISTORY:  Lumbar decompression, fusion, L4-5 on 8/23 R shoulder Rotator cuff pain  PAIN:  Are you having pain? No  PRECAUTIONS: Back, Pt wearing back brace, Reports under BLT precautions (bending, lifting, twisting)  - Plans to follow up with MD regarding timeline of precautions   RED FLAGS: Reports cont N/T down both LE, most in buttocks    WEIGHT BEARING RESTRICTIONS: No  FALLS:  Has patient fallen in last 6 months? No  LIVING ENVIRONMENT: Lives with: lives with their spouse Lives in: House/apartment Stairs: Yes: Internal: 13 steps; can reach both and External: 2 steps; none Has following equipment at home: Vannie - 2 wheeled and Shower bench, Back brace  OCCUPATION: N/A  PLOF: Reports bathing at sink, independent with dressing, reports wife doing all cleaning now, can prepare own meals   PATIENT GOALS: For 100% recovery, mobile, active, golf (last played last year)  NEXT MD VISIT: October 17th  OBJECTIVE:  Note: Objective measures were completed at Evaluation unless otherwise noted.  DIAGNOSTIC  FINDINGS:  IMPRESSION: 1. Bulky disc extrusion at L4-L5 with very Severe spinal stenosis.  Lateral recess stenosis may be greater on the left. Query L5 radiculitis. 2. Superimposed multifactorial Severe spinal stenosis also at L3-L4, and moderate spinal stenosis at L2-L3. Up to moderate bilateral L2 neural foraminal stenosis.  IMPRESSION: Intraoperative L4-L5 posterolateral and interbody surgical hardware.    PATIENT SURVEYS:  Modified Oswestry: Modified Oswestry Low Back Pain Disability Questionnaire: 20 / 50 = 40.0 %  Interpretation of scores: Score Category Description  0-20% Minimal Disability The patient can cope with most living activities. Usually no treatment is indicated apart from advice on lifting, sitting and exercise  21-40% Moderate Disability The patient experiences more pain and difficulty with sitting, lifting and standing. Travel and social life are more difficult and they may be disabled from work. Personal care, sexual activity and sleeping are not grossly affected, and the patient can usually be managed by conservative means  41-60% Severe Disability Pain remains the main problem in this group, but activities of daily living are affected. These patients require a detailed investigation  61-80% Crippled Back pain impinges on all aspects of the patient's life. Positive intervention is required  81-100% Bed-bound  These patients are either bed-bound or exaggerating their symptoms  Bluford FORBES Zoe DELENA Karon DELENA, et al. Surgery versus conservative management of stable thoracolumbar fracture: the PRESTO feasibility RCT. [place]): [employer]; 2021 Nov. [employer] Technology Assessment, No. 25.62.) Appendix 3, Oswestry Disability Index category descriptors. Available from: FindJewel[employer].cz  Minimally Clinically Important Difference (MCID) = 12.8% LEFS  Extreme difficulty/unable (0), Quite a bit of difficulty (1), Moderate difficulty  (2), Little difficulty (3), No difficulty (4) Survey date:    Any of your usual work, housework or school activities   2. Usual hobbies, recreational or sporting activities   3. Getting into/out of the bath   4. Walking between rooms   5. Putting on socks/shoes   6. Squatting    7. Lifting an object, like a bag of groceries from the floor   8. Performing light activities around your home   9. Performing heavy activities around your home   10. Getting into/out of a car   11. Walking 2 blocks   12. Walking 1 mile   13. Going up/down 10 stairs (1 flight)   14. Standing for 1 hour   15.  sitting for 1 hour   16. Running on even ground   17. Running on uneven ground   18. Making sharp turns while running fast   19. Hopping    20. Rolling over in bed   Score total:  Lower Extremity Functional Score: 45 / 80 = 56.3 %      COGNITION: Overall cognitive status: Within functional limits for tasks assessed     SENSATION: WFL,   POSTURE: rounded shoulders and forward head  PALPATION:  N/A  LUMBAR ROM:   AROM eval  Flexion   Extension   Right lateral flexion   Left lateral flexion   Right rotation   Left rotation    (Blank rows = not tested)  LOWER EXTREMITY ROM:     Active  Right eval Left eval  Hip flexion    Hip extension    Hip abduction    Hip adduction    Hip internal rotation    Hip external rotation    Knee flexion    Knee extension    Ankle dorsiflexion    Ankle plantarflexion    Ankle inversion    Ankle eversion     (Blank rows =  not tested)  LOWER EXTREMITY MMT:    MMT Right eval Left eval  Hip flexion 4 4  Hip extension 2+ 2+  Hip abduction 3 3-  Hip adduction    Hip internal rotation    Hip external rotation    Knee flexion    Knee extension    Ankle dorsiflexion 5 5  Ankle plantarflexion    Ankle inversion    Ankle eversion     (Blank rows = not tested)  LUMBAR SPECIAL TESTS:    FUNCTIONAL TESTS:  30 seconds chair stand  test 2 minute walk test: 321 ft on 10/05/23 30 second chair stand: 6 STS, no UE use  Tandem balance: 26 with RLE leading, 25 with LLE leading SLS: unable with BLE  GAIT: Distance walked: 100 ft in session  Assistive device utilized: Walker - 2 wheeled Level of assistance: Modified independence Comments: Dec speed, ambulates with RW, back brace donned, slight forward posture,   TREATMENT DATE:  10/26/2023  Therapeutic Exercise: -Treadmill, grade 2.0 incline, speed 1.0>1.3>1.5, pt cued for decreased UE support -Leg press, 2 sets of 10 reps, plate 5>6, pt cued for eccentric control, 1 additional set of 10 reps completed with plate 3, single leg RLE only; pt cued for decreased knee locking and eccentric control  Therapeutic Activity: -Walking marches/butt kicks, tandem walking/walking bwds, 4lb ankle weights, pt cued for max pain free ROM of LEs, pt requires CGA and cane, one lap each variation on 20 foot line -Lateral stepping 1 laps 40 feet per lap, with 4lb ankle weights around ankles, pt cued for upright posture, CGA, no cane -Lateral step up and overs to blue foam and 8 inch step, 1 set of 5 reps, pt cued for one UE support -Trampoline toss with yellow weighted ball, 4 STS, 4 throws each staggered stance on blue foam, pt cued for decreased UE support with cane during stepping onto blue foam  10/22/23: Nustep UE/LE [place] L5 average speed 81 5 min Standing inside // bars:    - Foam toe tapping 8 then 12in step 2 x10  - Lateral toe tapping between 6 and 8in step height 2x 10 intermittent HHA  - Vector stance with 1 UE support 5x 5 holds  - Squat front of chair 10x  - Tandem stance on foam 2x 30  - Lunge onto 8in step height 10x each no HHA Gait training with SPC, presents with good sequence and equal stride length x 263ft        10/19/23: QC 266ft good sequence - Toe tapping 6in step height - Lateral toe tapping between 6 and 8in step height 2x 10 intermittent HHA -  STS no HHA 10x - Squat front of chair  - SLS Rt 10; Lt 6  - Tandem stance 2x 30; Foam 2x 30 - Vector stance with 1 UE support 3x 5 holds - 6in hurdles forward and lateral step over 3RT intermittent HHA required during Lt LE weight bearing    PATIENT EDUCATION:  Education details: PT evaluation, objective findings, POC, Importance of HEP, Precautions, Clinic policies  Person educated: Patient Education method: Explanation and Demonstration Education comprehension: verbalized understanding and returned demonstration  HOME EXERCISE PROGRAM: Access Code: [email or web address] URL: [email or web address] Date: 10/15/2023 Prepared by: Rosaria Powell-Butler  Exercises - Clamshell  - 2 x daily - 7 x weekly - 3 sets - 10 reps - Supine Bridge  - 2 x daily - 7 x weekly - 3 sets - 10 reps - Seated  Long Arc Quad  - 2 x daily - 7 x weekly - 3 sets - 10 reps - Sit to Stand  - 2 x daily - 7 x weekly - 3 sets - 10 reps - Standing Tandem Balance with Counter Support  - 2 x daily - 7 x weekly - 3 sets - 10 reps - 30 hold - Neutral Curl Up with Straight Leg  - 1 x daily - 7 x weekly - 3 sets - 10 reps - 3 hold - Standing Single Leg Stance with Counter Support  - 2 x daily - 7 x weekly - 3 sets - 30 hold - Tandem Walking with Counter Support  - 2 x daily - 7 x weekly - 2 sets - 10 reps   ASSESSMENT:  CLINICAL IMPRESSION: Patient continues to demonstrate decreased LE strength, endurance, decreased gait quality and balance. Patient also demonstrates decreased endurance with aerobic based exercise during today's session on treadmill with limited speed and multiple rest breaks required throughout. Patient able to progress dynamic balance and core activation exercises today with leg press and resisted walking, good performance with verbal cueing. Patient would continue to benefit from skilled physical therapy for increased endurance with ambulation, increased RLE strength, and improved balance for  improved quality of life, improved independence with gait training and continued progress towards therapy goals.    EVAL:  Patient is a 78 y.o. male who was seen today for physical therapy evaluation and treatment for  G83.4 (ICD-10-CM) - Cauda equina compression (HCC)  . On this date, patient demonstrates decreased self perceived function via Modified oswestry as well as LEFS, decreased LE strength, impaired balance, altered gait, and decreased activity tolerance all which may be contributing to patients impaired overall function. Patient reports he's maintaining his no lifting,bending, twisting precautions and wearing brace when OOB. Will follow up with surgeon regarding timeline of precautions and brace wear. Patient will benefit from continued skilled physical therapy in order to address the above/below to improve current level of function for better quality of life.   OBJECTIVE IMPAIRMENTS: Abnormal gait, decreased activity tolerance, decreased balance, decreased endurance, decreased mobility, difficulty walking, decreased ROM, decreased strength, and postural dysfunction.   ACTIVITY LIMITATIONS: lifting, bending, standing, squatting, sleeping, stairs, and transfers  PARTICIPATION LIMITATIONS: meal prep, cleaning, laundry, driving, community activity, and yard work  PERSONAL FACTORS: N/A are also affecting patient's functional outcome.   REHAB POTENTIAL: Good  CLINICAL DECISION MAKING: Stable/uncomplicated  EVALUATION COMPLEXITY: Low   GOALS: Goals reviewed with patient? Yes  SHORT TERM GOALS: Target date: 10/22/23 Patient will be independent with performance of HEP to demonstrate adequate self management of symptoms.  Baseline:  Goal status: INITIAL  2.   Patient will report at least a 25% improvement with function and/or pain reduction overall since beginning PT. Baseline:  Goal status: INITIAL   LONG TERM GOALS: Target date: 11/11/23 Patient will improve LEFS score by 9  points to demonstrate improved perceived function while meeting MCID.  Baseline: Goal status: INITIAL Patient will improve Modified Oswestry score by 12.8% to demonstrate improved perceived function while meeting MCID.  Baseline: Goal status: INITIAL 3.  Patient will score at least a  4/5  on hip extension and abduction MMT with BLE to demonstrate increased LE strength and/or power needed to improve ambulation/gait mechanics.  Baseline:  Goal status: INITIAL   4.  Patient will increase 2 minute walk test gait distance by at least 40 ft with least restrictive assistive device to demonstrate  improved endurance and functional mobility needed for ambulating household and community distances.  Baseline: Goal status: INITIAL 5. Patient will be able to maintain SL balance on each LE for at least 5 to demonstrate improved balance needed to reduce risk of falls.  Baseline: Goal status: INITIAL   PLAN:  PT FREQUENCY: 2x/week  PT DURATION: 8 weeks  PLANNED INTERVENTIONS: 97164- PT Re-evaluation, 97110-Therapeutic exercises, 97530- Therapeutic activity, [id number]- Neuromuscular re-education, 97535- Self Care, [id number]- Manual therapy, [id number]- Gait training, [phone]- Electrical stimulation (manual), [id number]- Traction (mechanical), [phone] (1-2 muscles), [id number] (3+ muscles)- Dry Needling, Patient/Family education, Balance training, Stair training, Taping, Joint mobilization, Spinal mobilization, Cryotherapy, and Moist heat.  PLAN FOR NEXT SESSION: progress LE strengthening, balance.    Lang Ada, PT, DPT [facility] Office: [phone] 2:53 PM, 10/26/23

## 2023-10-29 ENCOUNTER — Encounter: Payer: Self-pay | Admitting: Internal Medicine

## 2023-10-29 ENCOUNTER — Encounter (HOSPITAL_COMMUNITY): Payer: Self-pay

## 2023-10-29 ENCOUNTER — Encounter (HOSPITAL_COMMUNITY)

## 2023-11-01 ENCOUNTER — Ambulatory Visit (HOSPITAL_COMMUNITY): Admitting: Physical Therapy

## 2023-11-01 ENCOUNTER — Encounter (HOSPITAL_COMMUNITY)

## 2023-11-01 ENCOUNTER — Other Ambulatory Visit: Payer: Self-pay | Admitting: Internal Medicine

## 2023-11-01 DIAGNOSIS — Z7409 Other reduced mobility: Secondary | ICD-10-CM | POA: Diagnosis not present

## 2023-11-01 DIAGNOSIS — M6281 Muscle weakness (generalized): Secondary | ICD-10-CM | POA: Diagnosis not present

## 2023-11-01 DIAGNOSIS — F5101 Primary insomnia: Secondary | ICD-10-CM

## 2023-11-01 DIAGNOSIS — G834 Cauda equina syndrome: Secondary | ICD-10-CM | POA: Diagnosis not present

## 2023-11-01 MED ORDER — TRAZODONE HCL 100 MG PO TABS: 100.0000 mg | ORAL_TABLET | Freq: Every evening | ORAL | 3 refills | Status: AC | PRN

## 2023-11-01 NOTE — Therapy (Signed)
 OUTPATIENT PHYSICAL THERAPY THORACOLUMBAR TREATMENT   Patient Name: Austin Bright MRN: [id number] DOB:[birth year], 78 y.o., male Today's Date: 11/01/2023  END OF SESSION:  PT End of Session - 11/01/23 1248     Visit Number 9    Number of Visits 16    Date for Recertification  10/29/23    Authorization Type [employer]    Authorization Time Period cohere covered 13 visits from 10/01/23-12/30/2023    Authorization - Visit Number 9    Authorization - Number of Visits 13    Progress Note Due on Visit 10    PT Start Time 1031    PT Stop Time 1115    PT Time Calculation (min) 44 min    Activity Tolerance Patient tolerated treatment well    Behavior During Therapy [facility] for tasks assessed/performed             Past Medical History:  Diagnosis Date   Acute diverticulitis 08/17/2020   Acute MI (HCC) 08/2011   Arthritis    Bilateral pneumonia    Diagnosed after STEMI 08/2011   CAD (coronary artery disease)    a. Diagnosed 08/2011 with anterior STEMI due to thrombotic occlusion of mid LAD s/p thrombectomy, PTCA, DES placement 08/23/11.   CHF (congestive heart failure) (HCC)    Chronic combined systolic and diastolic congestive heart failure (HCC) 09/15/2011   Dyslipidemia    Gout    Hypertension    Ischemic cardiomyopathy    a. Initial EF 35% by cath 08/23/11, improved to 40-45% by echo 08/25/11. 50-55% by echo November 2013.   Rotator cuff tear arthropathy, right    Shortness of breath    Past Surgical History:  Procedure Laterality Date   carpel tunnel Bilateral 2002   COLONOSCOPY WITH PROPOFOL  N/A 04/08/2017   Surgeon: [name], MD;  diverticulosis in sigmoid and descending colon, internal hemorrhoids, otherwise normal exam.  No recommendations to repeat due to age.   COLONOSCOPY WITH PROPOFOL  N/A 03/17/2021   Procedure: COLONOSCOPY WITH PROPOFOL ;  Surgeon: [name], DO;  Location: [facility];  Service: Endoscopy;  Laterality: N/A;  8:00am   CORONARY  STENT PLACEMENT     CYSTECTOMY  1969   pilonidal cyst   LEFT AND RIGHT HEART CATHETERIZATION WITH CORONARY ANGIOGRAM N/A 09/15/2011   Procedure: LEFT AND RIGHT HEART CATHETERIZATION WITH CORONARY ANGIOGRAM;  Surgeon: [name], MD;  Location: [facility] CATH LAB;  Service: Cardiovascular;  Laterality: N/A;   LEFT HEART CATH N/A 08/23/2011   Procedure: LEFT HEART CATH;  Surgeon: [name], MD;  Location: [facility] CATH LAB;  Service: Cardiovascular;  Laterality: N/A;   NO PAST SURGERIES     PERCUTANEOUS CORONARY STENT INTERVENTION (PCI-S)  08/23/2011   Procedure: PERCUTANEOUS CORONARY STENT INTERVENTION (PCI-S);  Surgeon: [name], MD;  Location: [facility] CATH LAB;  Service: Cardiovascular;;   Patient Active Problem List   Diagnosis Date Noted   Carbuncle 10/07/2023   Hospital discharge follow-up 10/07/2023   Abnormal LFTs 09/26/2023   Cauda equina compression (HCC) 09/14/2023   Cauda equina syndrome with neurogenic bladder (HCC) 09/12/2023   Lumbar spinal stenosis 09/11/2023   Tendinopathy of right rotator cuff 02/03/2023   Mixed hyperlipidemia 01/23/2022   Encounter for general adult medical examination with abnormal findings 07/18/2021   Prediabetes 07/18/2021   Vitamin D  deficiency 07/18/2021   History of diverticulitis 02/24/2021   Pancreatic lesion 02/24/2021   Prostate cancer screening 01/30/2021   Primary insomnia 01/27/2021  Hepatomegaly    Essential hypertension 08/18/2020   Idiopathic gout 11/16/2018   Neck mass 12/31/2015   Old anteroseptal myocardial infarction 08/24/2011   Coronary artery disease involving native coronary artery of native heart without angina pectoris 08/24/2011    PCP: [name], MD  REFERRING PROVIDER: Maurice Sharlet RAMAN, PA-C  REFERRING DIAG:  G83.4 (ICD-10-CM) - Cauda equina compression [facility])    Rationale for Evaluation and Treatment: Rehabilitation  THERAPY DIAG:  Muscle weakness  Impaired functional mobility, balance, gait, and  endurance  ONSET DATE: September 09, 2023  SUBJECTIVE:                                                                                                                                                                                           SUBJECTIVE STATEMENT: Pt states he is doing well today.  Working on walking without AD in home.  Compliance with HEP.    EVAL: Pt reports he was pain free until August 21st. Reports he woke up the next day with low back and LE pain. Reports he went to ER but they sent him home. N/T started a few days after that and he went back to ER, they sent him to [facility] and he had surgery the next day. Reports he had inpatient rehab for 1 week post-op. Pt reports he had a follow up this morning. Doctor pleased with progress.   PERTINENT HISTORY:  Lumbar decompression, fusion, L4-5 on 8/23 R shoulder Rotator cuff pain  PAIN:  Are you having pain? No  PRECAUTIONS: Back, Pt wearing back brace, Reports under BLT precautions (bending, lifting, twisting)  - Plans to follow up with MD regarding timeline of precautions   RED FLAGS: Reports cont N/T down both LE, most in buttocks    WEIGHT BEARING RESTRICTIONS: No  FALLS:  Has patient fallen in last 6 months? No  LIVING ENVIRONMENT: Lives with: lives with their spouse Lives in: House/apartment Stairs: Yes: Internal: 13 steps; can reach both and External: 2 steps; none Has following equipment at home: Vannie - 2 wheeled and Shower bench, Back brace  OCCUPATION: N/A  PLOF: Reports bathing at sink, independent with dressing, reports wife doing all cleaning now, can prepare own meals   PATIENT GOALS: For 100% recovery, mobile, active, golf (last played last year)  NEXT MD VISIT: October 17th  OBJECTIVE:  Note: Objective measures were completed at Evaluation unless otherwise noted.  DIAGNOSTIC FINDINGS:  IMPRESSION: 1. Bulky disc extrusion at L4-L5 with very Severe spinal stenosis. Lateral recess stenosis may be  greater on the left. Query L5 radiculitis. 2. Superimposed multifactorial Severe spinal stenosis also at  L3-L4, and moderate spinal stenosis at L2-L3. Up to moderate bilateral L2 neural foraminal stenosis.  IMPRESSION: Intraoperative L4-L5 posterolateral and interbody surgical hardware.    PATIENT SURVEYS:  Modified Oswestry: Modified Oswestry Low Back Pain Disability Questionnaire: 20 / 50 = 40.0 %  Interpretation of scores: Score Category Description  0-20% Minimal Disability The patient can cope with most living activities. Usually no treatment is indicated apart from advice on lifting, sitting and exercise  21-40% Moderate Disability The patient experiences more pain and difficulty with sitting, lifting and standing. Travel and social life are more difficult and they may be disabled from work. Personal care, sexual activity and sleeping are not grossly affected, and the patient can usually be managed by conservative means  41-60% Severe Disability Pain remains the main problem in this group, but activities of daily living are affected. These patients require a detailed investigation  61-80% Crippled Back pain impinges on all aspects of the patient's life. Positive intervention is required  81-100% Bed-bound  These patients are either bed-bound or exaggerating their symptoms  Bluford FORBES Zoe DELENA Karon DELENA, et al. Surgery versus conservative management of stable thoracolumbar fracture: the PRESTO feasibility RCT. [place]): [employer]; 2021 Nov. [facility] Technology Assessment, No. 25.62.) Appendix 3, Oswestry Disability Index category descriptors. Available from: FindJewelers.cz  Minimally Clinically Important Difference (MCID) = 12.8% LEFS  Extreme difficulty/unable (0), Quite a bit of difficulty (1), Moderate difficulty (2), Little difficulty (3), No difficulty (4) Survey date:    Any of your usual work, housework or school activities    2. Usual hobbies, recreational or sporting activities   3. Getting into/out of the bath   4. Walking between rooms   5. Putting on socks/shoes   6. Squatting    7. Lifting an object, like a bag of groceries from the floor   8. Performing light activities around your home   9. Performing heavy activities around your home   10. Getting into/out of a car   11. Walking 2 blocks   12. Walking 1 mile   13. Going up/down 10 stairs (1 flight)   14. Standing for 1 hour   15.  sitting for 1 hour   16. Running on even ground   17. Running on uneven ground   18. Making sharp turns while running fast   19. Hopping    20. Rolling over in bed   Score total:  Lower Extremity Functional Score: 45 / 80 = 56.3 %      COGNITION: Overall cognitive status: Within functional limits for tasks assessed     SENSATION: WFL,   POSTURE: rounded shoulders and forward head  PALPATION:  N/A  LUMBAR ROM:   AROM eval  Flexion   Extension   Right lateral flexion   Left lateral flexion   Right rotation   Left rotation    (Blank rows = not tested)  LOWER EXTREMITY ROM:     Active  Right eval Left eval  Hip flexion    Hip extension    Hip abduction    Hip adduction    Hip internal rotation    Hip external rotation    Knee flexion    Knee extension    Ankle dorsiflexion    Ankle plantarflexion    Ankle inversion    Ankle eversion     (Blank rows = not tested)  LOWER EXTREMITY MMT:    MMT Right eval Left eval  Hip flexion 4 4  Hip extension 2+ 2+  Hip abduction 3 3-  Hip adduction    Hip internal rotation    Hip external rotation    Knee flexion    Knee extension    Ankle dorsiflexion 5 5  Ankle plantarflexion    Ankle inversion    Ankle eversion     (Blank rows = not tested)  LUMBAR SPECIAL TESTS:    FUNCTIONAL TESTS:  30 seconds chair stand test 2 minute walk test: 321 ft on 10/05/23 30 second chair stand: 6 STS, no UE use  Tandem balance: 26 with RLE leading,  25 with LLE leading SLS: unable with BLE  GAIT: Distance walked: 100 ft in session  Assistive device utilized: Walker - 2 wheeled Level of assistance: Modified independence Comments: Dec speed, ambulates with RW, back brace donned, slight forward posture,   TREATMENT DATE:  11/01/23 Nustep UE/LE [place] L5 average speed  5 min, seat 10 Standing inside // bars:    Standing on Foam toe tapping 12in step 2 x10  Lateral step up to 8 down to foam up to 6 intermittent HHA 10RT  Forward lunges onto BOSU dome up no UE assist 2X10  Vector stance with 1 UE support 5x 5 holds X 2 sets [employer] leg press bilateral LE's 6PL 2X10, Rt only 3Pl 2X10, Lt only 3PL 2X10 SLR max 8 Lt, 10 Rt without UE assist   10/26/2023  Therapeutic Exercise: -Treadmill, grade 2.0 incline, speed 1.0>1.3>1.5, pt cued for decreased UE support -Leg press, 2 sets of 10 reps, plate 5>6, pt cued for eccentric control, 1 additional set of 10 reps completed with plate 3, single leg RLE only; pt cued for decreased knee locking and eccentric control  Therapeutic Activity: -Walking marches/butt kicks, tandem walking/walking bwds, 4lb ankle weights, pt cued for max pain free ROM of LEs, pt requires CGA and cane, one lap each variation on 20 foot line -Lateral stepping 1 laps 40 feet per lap, with 4lb ankle weights around ankles, pt cued for upright posture, CGA, no cane -Lateral step up and overs to blue foam and 8 inch step, 1 set of 5 reps, pt cued for one UE support -Trampoline toss with yellow weighted ball, 4 STS, 4 throws each staggered stance on blue foam, pt cued for decreased UE support with cane during stepping onto blue foam  10/22/23: Nustep UE/LE [place] L5 average speed 81 5 min Standing inside // bars:    - Foam toe tapping 8 then 12in step 2 x10  - Lateral toe tapping between 6 and 8in step height 2x 10 intermittent HHA  - Vector stance with 1 UE support 5x 5 holds  - Squat front of chair 10x  - Tandem  stance on foam 2x 30  - Lunge onto 8in step height 10x each no HHA Gait training with SPC, presents with good sequence and equal stride length x 24ft     10/19/23: QC 264ft good sequence - Toe tapping 6in step height - Lateral toe tapping between 6 and 8in step height 2x 10 intermittent HHA - STS no HHA 10x - Squat front of chair  - SLS Rt 10; Lt 6  - Tandem stance 2x 30; Foam 2x 30 - Vector stance with 1 UE support 3x 5 holds - 6in hurdles forward and lateral step over 3RT intermittent HHA required during Lt LE weight bearing    PATIENT EDUCATION:  Education details: PT evaluation, objective findings, POC, Importance of HEP, Precautions, Clinic policies  Person educated: Patient Education method: Medical illustrator Education comprehension: verbalized understanding and returned demonstration  HOME EXERCISE PROGRAM: Access Code: [email or web address] URL: [email or web address] Date: 10/15/2023 Prepared by: Rosaria Powell-Butler  Exercises - Clamshell  - 2 x daily - 7 x weekly - 3 sets - 10 reps - Supine Bridge  - 2 x daily - 7 x weekly - 3 sets - 10 reps - Seated Long Arc Quad  - 2 x daily - 7 x weekly - 3 sets - 10 reps - Sit to Stand  - 2 x daily - 7 x weekly - 3 sets - 10 reps - Standing Tandem Balance with Counter Support  - 2 x daily - 7 x weekly - 3 sets - 10 reps - 30 hold - Neutral Curl Up with Straight Leg  - 1 x daily - 7 x weekly - 3 sets - 10 reps - 3 hold - Standing Single Leg Stance with Counter Support  - 2 x daily - 7 x weekly - 3 sets - 30 hold - Tandem Walking with Counter Support  - 2 x daily - 7 x weekly - 2 sets - 10 reps   ASSESSMENT:  CLINICAL IMPRESSION: Continued with focus on LE strength and stabilization.  Progressed lunges to BOSU this session with ability to complete mostly without UE, intermittent HHA to steady self several times.  Pt verbalized burning in glut with activity.  Continued with obstacles/tasks to challenge  balance.  Pt did have several LOB while completing toe tap to 12 from foam due to toe grabbing.  Pt with noted breathing control and awarness.   Improved ability to maintain static balance on Lt today as compared to last test date.  Overall improving with adequate rest breaks needed.   Patient would continue to benefit from skilled physical therapy for increased endurance with ambulation, increased RLE strength, and improved balance for improved quality of life, improved independence with gait training and continued progress towards therapy goals.   EVAL:  Patient is a 78 y.o. male who was seen today for physical therapy evaluation and treatment for  G83.4 (ICD-10-CM) - Cauda equina compression (HCC)  . On this date, patient demonstrates decreased self perceived function via Modified oswestry as well as LEFS, decreased LE strength, impaired balance, altered gait, and decreased activity tolerance all which may be contributing to patients impaired overall function. Patient reports he's maintaining his no lifting,bending, twisting precautions and wearing brace when OOB. Will follow up with surgeon regarding timeline of precautions and brace wear. Patient will benefit from continued skilled physical therapy in order to address the above/below to improve current level of function for better quality of life.   OBJECTIVE IMPAIRMENTS: Abnormal gait, decreased activity tolerance, decreased balance, decreased endurance, decreased mobility, difficulty walking, decreased ROM, decreased strength, and postural dysfunction.   ACTIVITY LIMITATIONS: lifting, bending, standing, squatting, sleeping, stairs, and transfers  PARTICIPATION LIMITATIONS: meal prep, cleaning, laundry, driving, community activity, and yard work  PERSONAL FACTORS: N/A are also affecting patient's functional outcome.   REHAB POTENTIAL: Good  CLINICAL DECISION MAKING: Stable/uncomplicated  EVALUATION COMPLEXITY: Low   GOALS: Goals  reviewed with patient? Yes  SHORT TERM GOALS: Target date: 10/22/23 Patient will be independent with performance of HEP to demonstrate adequate self management of symptoms.  Baseline:  Goal status: INITIAL  2.   Patient will report at least a 25% improvement with function and/or pain reduction overall since beginning PT. Baseline:  Goal status: INITIAL   LONG TERM GOALS:  Target date: 11/11/23 Patient will improve LEFS score by 9 points to demonstrate improved perceived function while meeting MCID.  Baseline: Goal status: INITIAL Patient will improve Modified Oswestry score by 12.8% to demonstrate improved perceived function while meeting MCID.  Baseline: Goal status: INITIAL 3.  Patient will score at least a  4/5  on hip extension and abduction MMT with BLE to demonstrate increased LE strength and/or power needed to improve ambulation/gait mechanics.  Baseline:  Goal status: INITIAL   4.  Patient will increase 2 minute walk test gait distance by at least 40 ft with least restrictive assistive device to demonstrate improved endurance and functional mobility needed for ambulating household and community distances.  Baseline: Goal status: INITIAL 5. Patient will be able to maintain SL balance on each LE for at least 5 to demonstrate improved balance needed to reduce risk of falls.  Baseline: Goal status: INITIAL   PLAN:  PT FREQUENCY: 2x/week  PT DURATION: 8 weeks  PLANNED INTERVENTIONS: 97164- PT Re-evaluation, 97110-Therapeutic exercises, 97530- Therapeutic activity, [id number]- Neuromuscular re-education, 97535- Self Care, [id number]- Manual therapy, [id number]- Gait training, [phone]- Electrical stimulation (manual), [id number]- Traction (mechanical), [phone] (1-2 muscles), 20561 (3+ muscles)- Dry Needling, Patient/Family education, Balance training, Stair training, Taping, Joint mobilization, Spinal mobilization, Cryotherapy, and Moist heat.  PLAN FOR NEXT SESSION: progress LE strengthening,  balance.    Greig KATHEE Fuse, PTA/CLT [facility] Ph: [phone] 12:49 PM, 11/01/23

## 2023-11-04 ENCOUNTER — Encounter (HOSPITAL_COMMUNITY): Payer: Self-pay

## 2023-11-04 ENCOUNTER — Ambulatory Visit (HOSPITAL_COMMUNITY)

## 2023-11-04 ENCOUNTER — Ambulatory Visit: Admitting: Physician Assistant

## 2023-11-04 DIAGNOSIS — M6281 Muscle weakness (generalized): Secondary | ICD-10-CM | POA: Diagnosis not present

## 2023-11-04 DIAGNOSIS — Z7409 Other reduced mobility: Secondary | ICD-10-CM | POA: Diagnosis not present

## 2023-11-04 DIAGNOSIS — G834 Cauda equina syndrome: Secondary | ICD-10-CM | POA: Diagnosis not present

## 2023-11-04 NOTE — Therapy (Signed)
 OUTPATIENT PHYSICAL THERAPY THORACOLUMBAR TREATMENT/PN/Re-cert   Patient Name: Austin Bright MRN: [id number] DOB:[birth year], 78 y.o., male Today's Date: 11/04/2023    Progress Note Reporting Period 10/01/23 to 11/04/23  See note below for Objective Data and Assessment of Progress/Goals.     END OF SESSION:  PT End of Session - 11/04/23 1335     Visit Number 10    Number of Visits 13    Date for Recertification  12/02/23    Authorization Type [employer]    Authorization Time Period cohere covered 13 visits from 10/01/23-12/30/2023    Authorization - Visit Number 10    Authorization - Number of Visits 13    Progress Note Due on Visit 20    PT Start Time 1336    PT Stop Time 1415    PT Time Calculation (min) 39 min    Activity Tolerance Patient tolerated treatment well    Behavior During Therapy [facility] for tasks assessed/performed             Past Medical History:  Diagnosis Date   Acute diverticulitis 08/17/2020   Acute MI (HCC) 08/2011   Arthritis    Bilateral pneumonia    Diagnosed after STEMI 08/2011   CAD (coronary artery disease)    a. Diagnosed 08/2011 with anterior STEMI due to thrombotic occlusion of mid LAD s/p thrombectomy, PTCA, DES placement 08/23/11.   CHF (congestive heart failure) (HCC)    Chronic combined systolic and diastolic congestive heart failure (HCC) 09/15/2011   Dyslipidemia    Gout    Hypertension    Ischemic cardiomyopathy    a. Initial EF 35% by cath 08/23/11, improved to 40-45% by echo 08/25/11. 50-55% by echo November 2013.   Rotator cuff tear arthropathy, right    Shortness of breath    Past Surgical History:  Procedure Laterality Date   carpel tunnel Bilateral 2002   COLONOSCOPY WITH PROPOFOL  N/A 04/08/2017   Surgeon: [name], MD;  diverticulosis in sigmoid and descending colon, internal hemorrhoids, otherwise normal exam.  No recommendations to repeat due to age.   COLONOSCOPY WITH PROPOFOL  N/A 03/17/2021    Procedure: COLONOSCOPY WITH PROPOFOL ;  Surgeon: [name], DO;  Location: [facility];  Service: Endoscopy;  Laterality: N/A;  8:00am   CORONARY STENT PLACEMENT     CYSTECTOMY  1969   pilonidal cyst   LEFT AND RIGHT HEART CATHETERIZATION WITH CORONARY ANGIOGRAM N/A 09/15/2011   Procedure: LEFT AND RIGHT HEART CATHETERIZATION WITH CORONARY ANGIOGRAM;  Surgeon: [name], MD;  Location: [facility] CATH LAB;  Service: Cardiovascular;  Laterality: N/A;   LEFT HEART CATH N/A 08/23/2011   Procedure: LEFT HEART CATH;  Surgeon: [name], MD;  Location: [facility] CATH LAB;  Service: Cardiovascular;  Laterality: N/A;   NO PAST SURGERIES     PERCUTANEOUS CORONARY STENT INTERVENTION (PCI-S)  08/23/2011   Procedure: PERCUTANEOUS CORONARY STENT INTERVENTION (PCI-S);  Surgeon: [name], MD;  Location: [facility] CATH LAB;  Service: Cardiovascular;;   Patient Active Problem List   Diagnosis Date Noted   Carbuncle 10/07/2023   Hospital discharge follow-up 10/07/2023   Abnormal LFTs 09/26/2023   Cauda equina compression (HCC) 09/14/2023   Cauda equina syndrome with neurogenic bladder (HCC) 09/12/2023   Lumbar spinal stenosis 09/11/2023   Tendinopathy of right rotator cuff 02/03/2023   Mixed hyperlipidemia 01/23/2022   Encounter for general adult medical examination with abnormal findings 07/18/2021   Prediabetes 07/18/2021   Vitamin D  deficiency 07/18/2021  History of diverticulitis 02/24/2021   Pancreatic lesion 02/24/2021   Prostate cancer screening 01/30/2021   Primary insomnia 01/27/2021   Hepatomegaly    Essential hypertension 08/18/2020   Idiopathic gout 11/16/2018   Neck mass 12/31/2015   Old anteroseptal myocardial infarction 08/24/2011   Coronary artery disease involving native coronary artery of native heart without angina pectoris 08/24/2011    PCP: [name], MD  REFERRING PROVIDER: Maurice Sharlet RAMAN, PA-C  REFERRING DIAG:  G83.4 (ICD-10-CM) - Cauda equina  compression [facility])    Rationale for Evaluation and Treatment: Rehabilitation  THERAPY DIAG:  Muscle weakness  Impaired functional mobility, balance, gait, and endurance  Cauda equina compression [facility])  ONSET DATE: September 09, 2023  SUBJECTIVE:                                                                                                                                                                                           SUBJECTIVE STATEMENT: Pt reports no pain or issues this date. Reports he has been walking without cane mostly in home, no falls or near misses.   EVAL: Pt reports he was pain free until August 21st. Reports he woke up the next day with low back and LE pain. Reports he went to ER but they sent him home. N/T started a few days after that and he went back to ER, they sent him to [facility] and he had surgery the next day. Reports he had inpatient rehab for 1 week post-op. Pt reports he had a follow up this morning. Doctor pleased with progress.   PERTINENT HISTORY:  Lumbar decompression, fusion, L4-5 on 8/23 R shoulder Rotator cuff pain  PAIN:  Are you having pain? No  PRECAUTIONS: Back, Pt wearing back brace, Reports under BLT precautions (bending, lifting, twisting)  - Plans to follow up with MD regarding timeline of precautions   RED FLAGS: Reports cont N/T down both LE, most in buttocks    WEIGHT BEARING RESTRICTIONS: No  FALLS:  Has patient fallen in last 6 months? No  LIVING ENVIRONMENT: Lives with: lives with their spouse Lives in: House/apartment Stairs: Yes: Internal: 13 steps; can reach both and External: 2 steps; none Has following equipment at home: Vannie - 2 wheeled and Shower bench, Back brace  OCCUPATION: N/A  PLOF: Reports bathing at sink, independent with dressing, reports wife doing all cleaning now, can prepare own meals   PATIENT GOALS: For 100% recovery, mobile, active, golf (last played last year)  NEXT MD VISIT: October  17th  OBJECTIVE:  Note: Objective measures were completed at Evaluation unless otherwise noted.  DIAGNOSTIC FINDINGS:  IMPRESSION: 1.  Bulky disc extrusion at L4-L5 with very Severe spinal stenosis. Lateral recess stenosis may be greater on the left. Query L5 radiculitis. 2. Superimposed multifactorial Severe spinal stenosis also at L3-L4, and moderate spinal stenosis at L2-L3. Up to moderate bilateral L2 neural foraminal stenosis.  IMPRESSION: Intraoperative L4-L5 posterolateral and interbody surgical hardware.    PATIENT SURVEYS:  Modified Oswestry: Modified Oswestry Low Back Pain Disability Questionnaire: 20 / 50 = 40.0 %  Interpretation of scores: Score Category Description  0-20% Minimal Disability The patient can cope with most living activities. Usually no treatment is indicated apart from advice on lifting, sitting and exercise  21-40% Moderate Disability The patient experiences more pain and difficulty with sitting, lifting and standing. Travel and social life are more difficult and they may be disabled from work. Personal care, sexual activity and sleeping are not grossly affected, and the patient can usually be managed by conservative means  41-60% Severe Disability Pain remains the main problem in this group, but activities of daily living are affected. These patients require a detailed investigation  61-80% Crippled Back pain impinges on all aspects of the patient's life. Positive intervention is required  81-100% Bed-bound  These patients are either bed-bound or exaggerating their symptoms  Bluford FORBES Zoe DELENA Karon DELENA, et al. Surgery versus conservative management of stable thoracolumbar fracture: the PRESTO feasibility RCT. [place]): [employer]; 2021 Nov. [facility] Technology Assessment, No. 25.62.) Appendix 3, Oswestry Disability Index category descriptors. Available from: FindJewelers.cz  Minimally Clinically Important  Difference (MCID) = 12.8%  11/04/23: Modified Oswestry Low Back Pain Disability Questionnaire: 3 / 50 = 6.0 %  LEFS  Extreme difficulty/unable (0), Quite a bit of difficulty (1), Moderate difficulty (2), Little difficulty (3), No difficulty (4) Survey date:    Any of your usual work, housework or school activities   2. Usual hobbies, recreational or sporting activities   3. Getting into/out of the bath   4. Walking between rooms   5. Putting on socks/shoes   6. Squatting    7. Lifting an object, like a bag of groceries from the floor   8. Performing light activities around your home   9. Performing heavy activities around your home   10. Getting into/out of a car   11. Walking 2 blocks   12. Walking 1 mile   13. Going up/down 10 stairs (1 flight)   14. Standing for 1 hour   15.  sitting for 1 hour   16. Running on even ground   17. Running on uneven ground   18. Making sharp turns while running fast   19. Hopping    20. Rolling over in bed   Score total:  Lower Extremity Functional Score: 45 / 80 = 56.3 %     11/04/23: Lower Extremity Functional Score: 74 / 80 = 92.5 %    COGNITION: Overall cognitive status: Within functional limits for tasks assessed     SENSATION: WFL,   POSTURE: rounded shoulders and forward head  PALPATION:  N/A  LUMBAR ROM:   AROM eval  Flexion   Extension   Right lateral flexion   Left lateral flexion   Right rotation   Left rotation    (Blank rows = not tested)  LOWER EXTREMITY ROM:     Active  Right eval Left eval  Hip flexion    Hip extension    Hip abduction    Hip adduction    Hip internal rotation    Hip  external rotation    Knee flexion    Knee extension    Ankle dorsiflexion    Ankle plantarflexion    Ankle inversion    Ankle eversion     (Blank rows = not tested)  LOWER EXTREMITY MMT:    MMT Right eval Left eval R 11/04/23: L 11/04/23:  Hip flexion 4 4 4+ 4+  Hip extension 2+ 2+ 3+ 3+  Hip abduction  3 3- 4- 4-  Hip adduction      Hip internal rotation      Hip external rotation      Knee flexion      Knee extension      Ankle dorsiflexion 5 5 5 5   Ankle plantarflexion      Ankle inversion      Ankle eversion       (Blank rows = not tested)  LUMBAR SPECIAL TESTS:    FUNCTIONAL TESTS:  30 seconds chair stand test 2 minute walk test: 321 ft on 10/05/23 w/ RW 30 second chair stand: 6 STS, no UE use  Tandem balance: 26 with RLE leading, 25 with LLE leading SLS: unable with BLE  11/04/23: 30 sec chair stand: 12 STS, no UE use 2 minute walk test: 300 ft, no AD SLS:  7 on LLE     29 on RLE   GAIT: Distance walked: 100 ft in session  Assistive device utilized: Walker - 2 wheeled Level of assistance: Modified independence Comments: Dec speed, ambulates with RW, back brace donned, slight forward posture,   TREATMENT DATE:  11/04/23: Bike, seat 14, level 3, 8' while completing LEFS and Mod Oswestry Progress Note: LE MMT 30 sec chair stand test 2 minute walk test SL balance Review of goals Discussion of POC   11/01/23 Nustep UE/LE [place] L5 average speed  5 min, seat 10 Standing inside // bars:    Standing on Foam toe tapping 12in step 2 x10  Lateral step up to 8 down to foam up to 6 intermittent HHA 10RT  Forward lunges onto BOSU dome up no UE assist 2X10  Vector stance with 1 UE support 5x 5 holds X 2 sets [employer] leg press bilateral LE's 6PL 2X10, Rt only 3Pl 2X10, Lt only 3PL 2X10 SLR max 8 Lt, 10 Rt without UE assist   10/26/2023  Therapeutic Exercise: -Treadmill, grade 2.0 incline, speed 1.0>1.3>1.5, pt cued for decreased UE support -Leg press, 2 sets of 10 reps, plate 5>6, pt cued for eccentric control, 1 additional set of 10 reps completed with plate 3, single leg RLE only; pt cued for decreased knee locking and eccentric control  Therapeutic Activity: -Walking marches/butt kicks, tandem walking/walking bwds, 4lb ankle weights, pt cued for max pain  free ROM of LEs, pt requires CGA and cane, one lap each variation on 20 foot line -Lateral stepping 1 laps 40 feet per lap, with 4lb ankle weights around ankles, pt cued for upright posture, CGA, no cane -Lateral step up and overs to blue foam and 8 inch step, 1 set of 5 reps, pt cued for one UE support -Trampoline toss with yellow weighted ball, 4 STS, 4 throws each staggered stance on blue foam, pt cued for decreased UE support with cane during stepping onto blue foam   PATIENT EDUCATION:  Education details: PT evaluation, objective findings, POC, Importance of HEP, Precautions, Clinic policies  Person educated: Patient Education method: Explanation and Demonstration Education comprehension: verbalized understanding and returned demonstration  HOME EXERCISE PROGRAM: Access  Code: [email or web address] URL: [email or web address] Date: 10/15/2023 Prepared by: Rosaria Powell-Butler  Exercises - Clamshell  - 2 x daily - 7 x weekly - 3 sets - 10 reps - Supine Bridge  - 2 x daily - 7 x weekly - 3 sets - 10 reps - Seated Long Arc Quad  - 2 x daily - 7 x weekly - 3 sets - 10 reps - Sit to Stand  - 2 x daily - 7 x weekly - 3 sets - 10 reps - Standing Tandem Balance with Counter Support  - 2 x daily - 7 x weekly - 3 sets - 10 reps - 30 hold - Neutral Curl Up with Straight Leg  - 1 x daily - 7 x weekly - 3 sets - 10 reps - 3 hold - Standing Single Leg Stance with Counter Support  - 2 x daily - 7 x weekly - 3 sets - 30 hold - Tandem Walking with Counter Support  - 2 x daily - 7 x weekly - 2 sets - 10 reps   ASSESSMENT:  CLINICAL IMPRESSION: Progress note performed this date. Patient demonstrates progress with all things tested this date, including self perception of function via Modified Oswestry and LEFS, LE strength via MMT, and with all functional testing indicating improved gait mechanics and speed, LE power and endurance, and static balance. Patient has progressed from relying on RW for gait  to household distances without AD. Patient has met 5 of 7 objective goals this date and reports overall 80% improvement since beginning PT. Pt reports feeling most limited with balance. Plan to focus heavily on balance within next few sessions.  Patient would continue to benefit from skilled physical therapy for increased endurance with ambulation, increased RLE strength, and improved balance for improved quality of life, improved independence with gait training and continued progress towards therapy goals.   EVAL:  Patient is a 78 y.o. male who was seen today for physical therapy evaluation and treatment for  G83.4 (ICD-10-CM) - Cauda equina compression (HCC)  . On this date, patient demonstrates decreased self perceived function via Modified oswestry as well as LEFS, decreased LE strength, impaired balance, altered gait, and decreased activity tolerance all which may be contributing to patients impaired overall function. Patient reports he's maintaining his no lifting,bending, twisting precautions and wearing brace when OOB. Will follow up with surgeon regarding timeline of precautions and brace wear. Patient will benefit from continued skilled physical therapy in order to address the above/below to improve current level of function for better quality of life.   OBJECTIVE IMPAIRMENTS: Abnormal gait, decreased activity tolerance, decreased balance, decreased endurance, decreased mobility, difficulty walking, decreased ROM, decreased strength, and postural dysfunction.   ACTIVITY LIMITATIONS: lifting, bending, standing, squatting, sleeping, stairs, and transfers  PARTICIPATION LIMITATIONS: meal prep, cleaning, laundry, driving, community activity, and yard work  PERSONAL FACTORS: N/A are also affecting patient's functional outcome.   REHAB POTENTIAL: Good  CLINICAL DECISION MAKING: Stable/uncomplicated  EVALUATION COMPLEXITY: Low   GOALS: Goals reviewed with patient? Yes  SHORT TERM GOALS:  Target date: 10/22/23 Patient will be independent with performance of HEP to demonstrate adequate self management of symptoms.  Baseline:  Goal status: MET  2.   Patient will report at least a 25% improvement with function and/or pain reduction overall since beginning PT. Baseline: reports 80% improved on 11/04/23 Goal status: MET   LONG TERM GOALS: Target date: 11/26/23 Patient will improve LEFS score by 9 points to demonstrate improved perceived function while  meeting MCID.  Baseline: Goal status: MET Patient will improve Modified Oswestry score by 12.8% to demonstrate improved perceived function while meeting MCID.  Baseline: Goal status: MET 3.  Patient will score at least a  4/5  on hip extension and abduction MMT with BLE to demonstrate increased LE strength and/or power needed to improve ambulation/gait mechanics.  Baseline:  Goal status: IN PROGRESS   4.  Patient will increase 2 minute walk test gait distance by at least 40 ft with least restrictive assistive device to demonstrate improved endurance and functional mobility needed for ambulating household and community distances.  Baseline: Goal status: IN PROGRESS 5. Patient will be able to maintain SL balance on each LE for at least 5 to demonstrate improved balance needed to reduce risk of falls.  Baseline: Goal status: MET   PLAN:  PT FREQUENCY: 2x/week  PT DURATION: 8 weeks  PLANNED INTERVENTIONS: 97164- PT Re-evaluation, 97110-Therapeutic exercises, 97530- Therapeutic activity, [id number]- Neuromuscular re-education, 97535- Self Care, [id number]- Manual therapy, [id number]- Gait training, [phone]- Electrical stimulation (manual), [id number]- Traction (mechanical), [phone] (1-2 muscles), [id number] (3+ muscles)- Dry Needling, Patient/Family education, Balance training, Stair training, Taping, Joint mobilization, Spinal mobilization, Cryotherapy, and Moist heat.  PLAN FOR NEXT SESSION: progress LE strengthening, focus on balance with last few  sessions, schedule one more visit for week of 11/3 - 11/7    3:00 PM, 11/04/23 Rosaria Settler, PT, DPT [facility] [place]

## 2023-11-05 ENCOUNTER — Encounter: Attending: Physical Medicine and Rehabilitation | Admitting: Physical Medicine and Rehabilitation

## 2023-11-05 ENCOUNTER — Encounter: Payer: Self-pay | Admitting: Physical Medicine and Rehabilitation

## 2023-11-05 VITALS — BP 128/79 | HR 72 | Ht 70.0 in | Wt 210.0 lb

## 2023-11-05 DIAGNOSIS — G834 Cauda equina syndrome: Secondary | ICD-10-CM | POA: Insufficient documentation

## 2023-11-05 NOTE — Patient Instructions (Addendum)
 Pt is a 78 yr old male with hx on Cauda equina and lumbar radiculopathy s/p L4/5 decompression and fusion by Dr Mavis 09/11/23. With associated- mild neurogenic bowel and bladder; elevated LFTs, CAD/ICM/Chronic combined CHF, HTN, prediabetes,   Here for hospital f/u on cauda equina syndrome    Got approved to drive- from surgeon- going well  2. I agree pt is doing fantastic- and doesn't need to see me again  3. Can see me prn- as needed-  4. Con't therapy to finish out his time- and ocnitnue to improve endurance.   5. Has gotten back to work. - - no restrictions as of now

## 2023-11-05 NOTE — Progress Notes (Signed)
 Subjective:    Patient ID: Austin Bright, male    DOB: Jun 03, 1945, 78 y.o.   MRN: [id number]  HPI  Pt is a 78 yr old male with hx on Cauda equina and lumbar radiculopathy s/p L4/5 decompression and fusion by Dr Mavis 09/11/23. With associated- mild neurogenic bowel and bladder; elevated LFTs, CAD/ICM/Chronic combined CHF, HTN, prediabetes,   Here for hospital f/u on cauda equina syndrome   Able to do sit to stand- no UB strength required.   Wants s clean bill of health.  Walking unassisted- RW- to cane to nothing in home- uses quad cane outside the house just in case.  Endurance isn't where needs to be.   Off robaxin  and Oxy for some time. Has 1/2 Oxy left and off Flomax  Is taking trazodone  for sleep.  From PCP.   Pain free since woke up in recovery room.  Only discomfort is when pT pushes him to limit- and doing well  3 sessions left out of 13.      Numbness gone from waist to toes.    Pain Inventory Average Pain 0 Pain Right Now 0 My pain is .  LOCATION OF PAIN  .  BOWEL Number of stools per week: 8 Oral laxative use No  Type of laxative . Enema or suppository use No  History of colostomy No  Incontinent No   BLADDER Normal In and out cath, frequency . Able to self cath . 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, theatre/television/film  Neuro/Psych No problems in this area  Prior Studies Hospital f/u  Physicians involved in your care Hospital f/u   Family History  Problem Relation Age of Onset   Stroke Mother    Hypertension Father    Colon cancer Neg Hx    Pancreatic cancer Neg Hx    Social History   Socioeconomic History   Marital status: Married    Spouse name: Not on file   Number of children: Not on file   Years of education: Not on file   Highest education level:  Master's degree (e.g., MA, MS, MEng, MEd, MSW, MBA)  Occupational History   Occupation: Child psychotherapist  Tobacco Use   Smoking status: Former    Current packs/day: 0.00    Average packs/day: 1 pack/day for 25.0 years (25.0 ttl pk-yrs)    Types: Cigarettes    Start date: 08/23/1954    Quit date: 08/23/1979    Years since quitting: 44.2    Passive exposure: Past   Smokeless tobacco: Never  Vaping Use   Vaping status: Some Days   Substances: Nicotine  Substance and Sexual Activity   Alcohol use: Yes    Alcohol/week: 4.0 standard drinks of alcohol    Types: 2 Cans of beer, 2 Shots of liquor per week    Comment: 1-2 drinks on the weekends   Drug use: No   Sexual activity: Yes  Other Topics Concern   Not on file  Social History Narrative   Widower since 43,married for 10 years.Lives alone,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      11/09/2022 Remarried. Wifes name is Austin Bright.    Social Drivers of Health   Financial Resource Strain: Low Risk  (07/30/2023)   Overall Financial Resource Strain (CARDIA)  Difficulty of Paying Living Expenses: Not hard at all  Food Insecurity: No Food Insecurity (09/11/2023)   Hunger Vital Sign    Worried About Running Out of Food in the Last Year: Never true    Ran Out of Food in the Last Year: Never true  Transportation Needs: No Transportation Needs (09/11/2023)   PRAPARE - Administrator, Civil Service (Medical): No    Lack of Transportation (Non-Medical): No  Physical Activity: Insufficiently Active (07/30/2023)   Exercise Vital Sign    Days of Exercise per Week: 3 days    Minutes of Exercise per Session: 20 min  Stress: No Stress Concern Present (07/30/2023)   [employer] of Occupational Health - Occupational Stress Questionnaire    Feeling of Stress: Not at all  Social Connections: Moderately Integrated (09/11/2023)   Social Connection and Isolation Panel    Frequency of Communication with  Friends and Family: Twice a week    Frequency of Social Gatherings with Friends and Family: Once a week    Attends Religious Services: Never    Database administrator or Organizations: Yes    Attends Banker Meetings: 1 to 4 times per year    Marital Status: Married   Past Surgical History:  Procedure Laterality Date   carpel tunnel Bilateral 2002   COLONOSCOPY WITH PROPOFOL  N/A 04/08/2017   Surgeon: [name], MD;  diverticulosis in sigmoid and descending colon, internal hemorrhoids, otherwise normal exam.  No recommendations to repeat due to age.   COLONOSCOPY WITH PROPOFOL  N/A 03/17/2021   Procedure: COLONOSCOPY WITH PROPOFOL ;  Surgeon: [name], DO;  Location: [facility];  Service: Endoscopy;  Laterality: N/A;  8:00am   CORONARY STENT PLACEMENT     CYSTECTOMY  1969   pilonidal cyst   LEFT AND RIGHT HEART CATHETERIZATION WITH CORONARY ANGIOGRAM N/A 09/15/2011   Procedure: LEFT AND RIGHT HEART CATHETERIZATION WITH CORONARY ANGIOGRAM;  Surgeon: [name], MD;  Location: [facility] CATH LAB;  Service: Cardiovascular;  Laterality: N/A;   LEFT HEART CATH N/A 08/23/2011   Procedure: LEFT HEART CATH;  Surgeon: [name], MD;  Location: [facility] CATH LAB;  Service: Cardiovascular;  Laterality: N/A;   NO PAST SURGERIES     PERCUTANEOUS CORONARY STENT INTERVENTION (PCI-S)  08/23/2011   Procedure: PERCUTANEOUS CORONARY STENT INTERVENTION (PCI-S);  Surgeon: [name], MD;  Location: [facility] CATH LAB;  Service: Cardiovascular;;   Past Medical History:  Diagnosis Date   Acute diverticulitis 08/17/2020   Acute MI (HCC) 08/2011   Arthritis    Bilateral pneumonia    Diagnosed after STEMI 08/2011   CAD (coronary artery disease)    a. Diagnosed 08/2011 with anterior STEMI due to thrombotic occlusion of mid LAD s/p thrombectomy, PTCA, DES placement 08/23/11.   CHF (congestive heart failure) (HCC)    Chronic combined systolic and diastolic congestive heart failure (HCC)  09/15/2011   Dyslipidemia    Gout    Hypertension    Ischemic cardiomyopathy    a. Initial EF 35% by cath 08/23/11, improved to 40-45% by echo 08/25/11. 50-55% by echo November 2013.   Rotator cuff tear arthropathy, right    Shortness of breath    BP 128/79   Pulse 72   Ht 5' 10 (1.778 m)   Wt 210 lb (95.3 kg)   SpO2 96%   BMI 30.13 kg/m   Opioid Risk Score:   Fall Risk Score:  `1  Depression screen [facility] 2/9  11/05/2023   11:47 AM 10/07/2023   11:04 AM 08/03/2023    8:11 AM 11/09/2022    8:07 AM 08/03/2022    8:10 AM 01/23/2022    8:07 AM 10/15/2021    8:30 AM  Depression screen PHQ 2/9  Decreased Interest 0 0 0 0 0 0 0  Down, Depressed, Hopeless 0 0 0 0 0 0 0  PHQ - 2 Score 0 0 0 0 0 0 0  Altered sleeping 0 0 0 0  3   Tired, decreased energy 0 0 0 0  0   Change in appetite 0 0 0 0  0   Feeling bad or failure about yourself  0 0 0 0  0   Trouble concentrating 0 0 0 0  0   Moving slowly or fidgety/restless 0 0 0 0  0   Suicidal thoughts 0 0 0 0  0   PHQ-9 Score 0 0 0 0  3   Difficult doing work/chores  Not difficult at all Not difficult at all Not difficult at all        Review of Systems  All other systems reviewed and are negative.      Objective:   Physical Exam  Awake, alert, appropriate, accompanied by wife, NAD Using quad cane for longer balance 5/5 in LE's B/L throughout Intact to light touch in LE's B/L     Assessment & Plan:   Pt is a 78 yr old male with hx on Cauda equina and lumbar radiculopathy s/p L4/5 decompression and fusion by Dr Mavis 09/11/23. With associated- mild neurogenic bowel and bladder; elevated LFTs, CAD/ICM/Chronic combined CHF, HTN, prediabetes,   Here for hospital f/u on cauda equina syndrome    Got approved to drive- from surgeon- going well  2. I agree pt is doing fantastic- and doesn't need to see me again  3. Can see me prn- as needed-  4. Con't therapy to finish out his time- and ocnitnue to improve endurance.   5.  Has gotten back to work. -

## 2023-11-08 ENCOUNTER — Encounter (HOSPITAL_COMMUNITY)

## 2023-11-09 ENCOUNTER — Ambulatory Visit: Admitting: Physician Assistant

## 2023-11-10 ENCOUNTER — Ambulatory Visit: Payer: Medicare PPO

## 2023-11-10 VITALS — BP 120/70 | Ht 70.0 in | Wt 204.0 lb

## 2023-11-10 DIAGNOSIS — Z0000 Encounter for general adult medical examination without abnormal findings: Secondary | ICD-10-CM | POA: Diagnosis not present

## 2023-11-10 NOTE — Patient Instructions (Signed)
 [name],  Thank you for taking the time for your Medicare Wellness Visit. I appreciate your continued commitment to your health goals. Please review the care plan we discussed, and feel free to reach out if I can assist you further.  Medicare recommends these wellness visits once per year to help you and your care team stay ahead of potential health issues. These visits are designed to focus on prevention, allowing your provider to concentrate on managing your acute and chronic conditions during your regular appointments.  Please note that Annual Wellness Visits do not include a physical exam. Some assessments may be limited, especially if the visit was conducted virtually. If needed, we may recommend a separate in-person follow-up with your provider.  Wishing you excellent health and many blessings in the year to come!  -Kashon Kraynak, CMA  Ongoing Care Seeing your primary care provider every 3 to 6 months helps us  monitor your health and provide consistent, personalized care.   Recommended Screenings:  Health Maintenance  Topic Date Due   Zoster (Shingles) Vaccine (1 of 2) 10/17/1996   DTaP/Tdap/Td vaccine (2 - Td or Tdap) 09/22/2022   Flu Shot  08/20/2023   COVID-19 Vaccine (7 - 2025-26 season) 09/20/2023   Medicare Annual Wellness Visit  11/09/2024   Pneumococcal Vaccine for age over 17  Completed   Hepatitis C Screening  Completed   Meningitis B Vaccine  Aged Out   Colon Cancer Screening  Discontinued       11/10/2023    8:15 AM  Advanced Directives  Does Patient Have a Medical Advance Directive? Yes  Type of Estate agent of [place];Living will  Copy of Healthcare Power of Attorney in Chart? No - copy requested   Advance Care Planning is important because it: Ensures you receive medical care that aligns with your values, goals, and preferences. Provides guidance to your family and loved ones, reducing the emotional burden of decision-making during critical  moments.  Vision: Annual vision screenings are recommended for early detection of glaucoma, cataracts, and diabetic retinopathy. These exams can also reveal signs of chronic conditions such as diabetes and high blood pressure.  Dental: Annual dental screenings help detect early signs of oral cancer, gum disease, and other conditions linked to overall health, including heart disease and diabetes.  Please see the attached documents for additional preventive care recommendations.

## 2023-11-10 NOTE — Progress Notes (Signed)
 Subjective:   Austin Bright is a 78 y.o. who presents for a Medicare Wellness preventive visit.  As a reminder, Annual Wellness Visits don't include a physical exam, and some assessments may be limited, especially if this visit is performed virtually. We may recommend an in-person follow-up visit with your provider if needed.  Visit Complete: Virtual I connected with  Austin Bright on 11/10/23 by a audio enabled telemedicine application and verified that I am speaking with the correct person using two identifiers.  Patient Location: Home  Provider Location: Office/Clinic  I discussed the limitations of evaluation and management by telemedicine. The patient expressed understanding and agreed to proceed.  Vital Signs: Because this visit was a virtual/telehealth visit, some criteria may be missing or patient reported. Any vitals not documented were not able to be obtained and vitals that have been documented are patient reported.  VideoDeclined- This patient declined Librarian, academic. Therefore the visit was completed with audio only.  Persons Participating in Visit: Patient.  AWV Questionnaire: Yes: Patient Medicare AWV questionnaire was completed by the patient on 11/06/2023; I have confirmed that all information answered by patient is correct and no changes since this date.  Cardiac Risk Factors include: advanced age (>26men, >45 women);dyslipidemia;hypertension;male gender     Objective:    Today's Vitals   11/06/23 1057 11/10/23 0808  BP:  120/70  Weight:  204 lb (92.5 kg)  Height:  5' 10 (1.778 m)  PainSc: 0-No pain 0-No pain   Body mass index is 29.27 kg/m.     11/10/2023    8:15 AM 10/01/2023    1:04 PM 09/14/2023    5:00 PM 09/11/2023   10:26 AM 09/09/2023    2:05 AM 11/09/2022    8:05 AM 10/15/2021    8:34 AM  Advanced Directives  Does Patient Have a Medical Advance Directive? Yes Yes No No No Yes Yes  Type of Sports coach of [place];Living will Living will    Healthcare Power of [place];Living will Living will  Copy of Healthcare Power of Attorney in Chart? No - copy requested     No - copy requested   Would patient like information on creating a medical advance directive?  No - Patient declined No - Patient declined No - Patient declined       Current Medications (verified) Outpatient Encounter Medications as of 11/10/2023  Medication Sig   ascorbic acid  (VITAMIN C ) 1000 MG tablet Take 1 tablet (1,000 mg total) by mouth daily after supper.   aspirin  EC 81 MG tablet Take 81 mg by mouth daily.   cephALEXin  (KEFLEX ) 500 MG capsule Take 1 capsule (500 mg total) by mouth 3 (three) times daily.   colchicine 0.6 MG tablet Take 1 tablet (0.6 mg total) by mouth daily.   Cyanocobalamin  (VITAMIN B-12 PO) Take 1 tablet by mouth at bedtime.   ezetimibe  (ZETIA ) 10 MG tablet TAKE ONE TABLET BY MOUTH EVERY DAY   lisinopril  (ZESTRIL ) 20 MG tablet Take 1 tablet (20 mg total) by mouth daily.   metoprolol  succinate (TOPROL -XL) 25 MG 24 hr tablet TAKE 1 TABLET BY MOUTH ONCE DAILY. (Patient taking differently: Take 25 mg by mouth at bedtime.)   nitroGLYCERIN  (NITROSTAT ) 0.4 MG SL tablet PLACE 1 TAB UNDER TONGUE EVERY 5 MIN IF NEEDED FOR CHEST PAIN. MAY USE 3 TIMES.NO RELIEF CALL 911.   rosuvastatin  (CRESTOR ) 40 MG tablet Take 1 tablet (40 mg total) by mouth daily. (  Patient taking differently: Take 40 mg by mouth at bedtime.)   semaglutide -weight management (WEGOVY ) 0.5 MG/0.5ML SOAJ SQ injection Inject 0.5 mg into the skin every 7 (seven) days. (Patient taking differently: Inject 0.5 mg into the skin every Saturday.)   traZODone  (DESYREL ) 100 MG tablet Take 1 tablet (100 mg total) by mouth at bedtime as needed for sleep.   No facility-administered encounter medications on file as of 11/10/2023.    Allergies (verified) Flonase  [fluticasone  propionate]   History: Past Medical History:  Diagnosis  Date   Acute diverticulitis 08/17/2020   Acute MI (HCC) 08/2011   Arthritis    Bilateral pneumonia    Diagnosed after STEMI 08/2011   CAD (coronary artery disease)    a. Diagnosed 08/2011 with anterior STEMI due to thrombotic occlusion of mid LAD s/p thrombectomy, PTCA, DES placement 08/23/11.   CHF (congestive heart failure) (HCC)    Chronic combined systolic and diastolic congestive heart failure (HCC) 09/15/2011   Dyslipidemia    Gout    Hypertension    Ischemic cardiomyopathy    a. Initial EF 35% by cath 08/23/11, improved to 40-45% by echo 08/25/11. 50-55% by echo November 2013.   Rotator cuff tear arthropathy, right    Shortness of breath    Past Surgical History:  Procedure Laterality Date   carpel tunnel Bilateral 2002   COLONOSCOPY WITH PROPOFOL  N/A 04/08/2017   Surgeon: [name], MD;  diverticulosis in sigmoid and descending colon, internal hemorrhoids, otherwise normal exam.  No recommendations to repeat due to age.   COLONOSCOPY WITH PROPOFOL  N/A 03/17/2021   Procedure: COLONOSCOPY WITH PROPOFOL ;  Surgeon: [name], DO;  Location: [facility];  Service: Endoscopy;  Laterality: N/A;  8:00am   CORONARY STENT PLACEMENT     CYSTECTOMY  1969   pilonidal cyst   LEFT AND RIGHT HEART CATHETERIZATION WITH CORONARY ANGIOGRAM N/A 09/15/2011   Procedure: LEFT AND RIGHT HEART CATHETERIZATION WITH CORONARY ANGIOGRAM;  Surgeon: [name], MD;  Location: [facility] CATH LAB;  Service: Cardiovascular;  Laterality: N/A;   LEFT HEART CATH N/A 08/23/2011   Procedure: LEFT HEART CATH;  Surgeon: [name], MD;  Location: [facility] CATH LAB;  Service: Cardiovascular;  Laterality: N/A;   NO PAST SURGERIES     PERCUTANEOUS CORONARY STENT INTERVENTION (PCI-S)  08/23/2011   Procedure: PERCUTANEOUS CORONARY STENT INTERVENTION (PCI-S);  Surgeon: [name], MD;  Location: [facility] CATH LAB;  Service: Cardiovascular;;   Family History  Problem Relation Age of Onset   Stroke Mother     Hypertension Father    Colon cancer Neg Hx    Pancreatic cancer Neg Hx    Social History   Socioeconomic History   Marital status: Married    Spouse name: Not on file   Number of children: Not on file   Years of education: Not on file   Highest education level: Master's degree (e.g., MA, MS, MEng, MEd, MSW, MBA)  Occupational History   Occupation: Child psychotherapist  Tobacco Use   Smoking status: Former    Current packs/day: 0.00    Average packs/day: 1 pack/day for 25.0 years (25.0 ttl pk-yrs)    Types: Cigarettes    Start date: 08/23/1954    Quit date: 08/23/1979    Years since quitting: 44.2    Passive exposure: Past   Smokeless tobacco: Never  Vaping Use   Vaping status: Some Days   Substances: Nicotine  Substance and Sexual Activity   Alcohol use: Not  Currently    Alcohol/week: 4.0 standard drinks of alcohol    Types: 2 Cans of beer, 2 Shots of liquor per week    Comment: 1-2 drinks on the weekends   Drug use: No   Sexual activity: Yes    Birth control/protection: Surgical  Other Topics Concern   Not on file  Social History Narrative   Widower since 2017,married for 10 years.Lives alone,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      11/09/2022 Remarried. Wifes name is [name].    Social Drivers of Corporate investment banker Strain: Low Risk  (11/10/2023)   Overall Financial Resource Strain (CARDIA)    Difficulty of Paying Living Expenses: Not hard at all  Food Insecurity: No Food Insecurity (11/10/2023)   Hunger Vital Sign    Worried About Running Out of Food in the Last Year: Never true    Ran Out of Food in the Last Year: Never true  Transportation Needs: No Transportation Needs (11/10/2023)   PRAPARE - Administrator, Civil Service (Medical): No    Lack of Transportation (Non-Medical): No  Physical Activity: Sufficiently Active (11/10/2023)   Exercise Vital Sign    Days of Exercise per Week: 7 days    Minutes of  Exercise per Session: 30 min  Stress: No Stress Concern Present (11/10/2023)   [employer] of Occupational Health - Occupational Stress Questionnaire    Feeling of Stress: Not at all  Social Connections: Moderately Integrated (11/10/2023)   Social Connection and Isolation Panel    Frequency of Communication with Friends and Family: More than three times a week    Frequency of Social Gatherings with Friends and Family: Once a week    Attends Religious Services: Never    Database administrator or Organizations: Yes    Attends Engineer, structural: More than 4 times per year    Marital Status: Married    Tobacco Counseling Counseling given: Yes    Clinical Intake:  Pre-visit preparation completed: Yes  Pain : No/denies pain Pain Score: 0-No pain     BMI - recorded: 29.27 Nutritional Status: BMI 25 -29 Overweight Nutritional Risks: None Diabetes: No  Lab Results  Component Value Date   HGBA1C 6.2 (H) 08/02/2023   HGBA1C 5.9 (H) 08/21/2022   HGBA1C 6.1 (H) 01/23/2022     How often do you need to have someone help you when you read instructions, pamphlets, or other written materials from your doctor or pharmacy?: 1 - Never  Interpreter Needed?: No  Information entered by :: Jalyiah Shelley W CMA (AAMA)   Activities of Daily Living     11/06/2023   10:57 AM 09/21/2023    8:30 PM  In your present state of health, do you have any difficulty performing the following activities:  Hearing? 0   Vision? 0   Difficulty concentrating or making decisions? 0   Walking or climbing stairs? 0   Dressing or bathing? 0   Doing errands, shopping? 0 0  Preparing Food and eating ? N   Using the Toilet? N   In the past six months, have you accidently leaked urine? N   Do you have problems with loss of bowel control? N   Managing your Medications? N   Managing your Finances? N   Housekeeping or managing your Housekeeping? N     Patient Care Team: [name], MD as PCP  - General (Internal Medicine) Alvan Dorn FALCON, MD as  PCP - Cardiology (Cardiology) Edith, Debby BROCKS, MD as Attending Physician (Cardiology) [name], MD as Consulting Physician (Gastroenterology)  I have updated your Care Teams any recent Medical Services you may have received from other providers in the past year.     Assessment:   This is a routine wellness examination for Austin Bright.  Hearing/Vision screen Hearing Screening - Comments:: Patient denies any hearing difficulties.   Vision Screening - Comments:: Wears rx glasses - Sees [facility] in [place]. Not up to date. Patient will call to schedule an appt   Goals Addressed               This Visit's Progress     I want to lose weight and improve my overall physical condition and remain active (pt-stated)          Depression Screen     11/10/2023    8:17 AM 11/05/2023   11:47 AM 10/07/2023   11:04 AM 08/03/2023    8:11 AM 11/09/2022    8:07 AM 08/03/2022    8:10 AM 01/23/2022    8:07 AM  PHQ 2/9 Scores  PHQ - 2 Score 0 0 0 0 0 0 0  PHQ- 9 Score 0 0 0 0 0  3     Fall Risk     11/06/2023   10:57 AM 11/05/2023   11:46 AM 10/07/2023   11:04 AM 08/03/2023    8:10 AM 11/06/2022   11:11 AM  Fall Risk   Falls in the past year? 1 0 0 1 1  Number falls in past yr: 0  0 0 0  Injury with Fall? 0  0 0 1  Risk for fall due to : No Fall Risks  No Fall Risks No Fall Risks History of fall(s)  Follow up Falls evaluation completed;Education provided;Falls prevention discussed  Falls evaluation completed Falls evaluation completed Falls prevention discussed;Education provided    MEDICARE RISK AT HOME:  Medicare Risk at Home Any stairs in or around the home?: (Patient-Rptd) Yes If so, are there any without handrails?: (Patient-Rptd) No Home free of loose throw rugs in walkways, pet beds, electrical cords, etc?: (Patient-Rptd) Yes Adequate lighting in your home to reduce risk of falls?: (Patient-Rptd) Yes Life  alert?: (Patient-Rptd) No Use of a cane, walker or w/c?: (Patient-Rptd) Yes Grab bars in the bathroom?: (Patient-Rptd) Yes Shower chair or bench in shower?: (Patient-Rptd) No Elevated toilet seat or a handicapped toilet?: (Patient-Rptd) Yes  TIMED UP AND GO:  Was the test performed?  No  Cognitive Function: 6CIT completed        11/10/2023    8:17 AM 11/09/2022    8:07 AM 10/15/2021    8:33 AM 10/04/2020    3:40 PM  6CIT Screen  What Year? 0 points 0 points 0 points 0 points  What month? 0 points 0 points 0 points 0 points  What time? 0 points 0 points 0 points 0 points  Count back from 20 0 points 0 points 0 points 0 points  Months in reverse 0 points 0 points 0 points 0 points  Repeat phrase 0 points 0 points 0 points 0 points  Total Score 0 points 0 points 0 points 0 points    Immunizations Immunization History  Administered Date(s) Administered   Fluad Quad(high Dose 65+) 12/11/2019, 09/26/2020, 10/09/2022   INFLUENZA, HIGH DOSE SEASONAL PF 09/22/2018   Influenza-Unspecified 10/19/2017, 10/12/2021   Moderna Covid-19 Fall Seasonal Vaccine 17yrs & older 10/09/2022   Moderna  Sars-Covid-2 Vaccination 03/16/2019, 04/14/2019, 11/16/2019, 08/11/2020, 05/24/2021   Pneumococcal Conjugate-13 10/31/2013   Pneumococcal Polysaccharide-23 10/21/2011, 10/11/2017   Tdap 09/21/2012   Zoster, Live 07/28/2013    Screening Tests Health Maintenance  Topic Date Due   Zoster Vaccines- Shingrix (1 of 2) 10/17/1996   DTaP/Tdap/Td (2 - Td or Tdap) 09/22/2022   Influenza Vaccine  08/20/2023   COVID-19 Vaccine (7 - 2025-26 season) 09/20/2023   Medicare Annual Wellness (AWV)  11/09/2024   Pneumococcal Vaccine: 50+ Years  Completed   Hepatitis C Screening  Completed   Meningococcal B Vaccine  Aged Out   Colonoscopy  Discontinued    Health Maintenance Health Maintenance Due  Topic Date Due   Zoster Vaccines- Shingrix (1 of 2) 10/17/1996   DTaP/Tdap/Td (2 - Td or Tdap) 09/22/2022    Influenza Vaccine  08/20/2023   COVID-19 Vaccine (7 - 2025-26 season) 09/20/2023   Health Maintenance Items Addressed: Patient advised of recommended vaccines and where to obtain those vaccines with verbal understanding  Additional Screening:  Vision Screening: Recommended annual ophthalmology exams for early detection of glaucoma and other disorders of the eye. Would you like a referral to an eye doctor? No    Dental Screening: Recommended annual dental exams for proper oral hygiene  Community Resource Referral / Chronic Care Management: CRR required this visit?  No   CCM required this visit?  No   Plan:    I have personally reviewed and noted the following in the patient's chart:   Medical and social history Use of alcohol, tobacco or illicit drugs  Current medications and supplements including opioid prescriptions. Patient is not currently taking opioid prescriptions. Functional ability and status Nutritional status Physical activity Advanced directives List of other physicians Hospitalizations, surgeries, and ER visits in previous 12 months Vitals Screenings to include cognitive, depression, and falls Referrals and appointments  In addition, I have reviewed and discussed with patient certain preventive protocols, quality metrics, and best practice recommendations. A written personalized care plan for preventive services as well as general preventive health recommendations were provided to patient.   Ariele Vidrio, CMA   11/10/2023   After Visit Summary: (MyChart) Due to this being a telephonic visit, the after visit summary with patients personalized plan was offered to patient via MyChart   Notes: Nothing significant to report at this time.

## 2023-11-11 ENCOUNTER — Encounter (HOSPITAL_COMMUNITY): Payer: Self-pay

## 2023-11-11 ENCOUNTER — Ambulatory Visit (HOSPITAL_COMMUNITY)

## 2023-11-11 ENCOUNTER — Encounter (HOSPITAL_COMMUNITY)

## 2023-11-11 DIAGNOSIS — M6281 Muscle weakness (generalized): Secondary | ICD-10-CM

## 2023-11-11 DIAGNOSIS — G834 Cauda equina syndrome: Secondary | ICD-10-CM

## 2023-11-11 DIAGNOSIS — Z7409 Other reduced mobility: Secondary | ICD-10-CM | POA: Diagnosis not present

## 2023-11-11 NOTE — Therapy (Addendum)
 OUTPATIENT PHYSICAL THERAPY THORACOLUMBAR TREATMENT/DISCHARGE   Patient Name: Austin Bright MRN: [id number] DOB:[birth year], 78 y.o., male Today's Date: 11/11/2023   PHYSICAL THERAPY DISCHARGE SUMMARY  Visits from Start of Care: 11  Current functional level related to goals / functional outcomes: See below   Remaining deficits: See below   Education / Equipment: HEP   Patient agrees to discharge. Patient goals were met. Patient is being discharged due to meeting the stated rehab goals.   END OF SESSION:  PT End of Session - 11/11/23 0952     Visit Number 11    Number of Visits 13    Date for Recertification  --    Authorization Type [employer]    Authorization Time Period cohere covered 13 visits from 10/01/23-12/30/2023    Authorization - Visit Number 11    Authorization - Number of Visits 13    Progress Note Due on Visit --    PT Start Time 0951    PT Stop Time 1029    PT Time Calculation (min) 38 min    Activity Tolerance Patient tolerated treatment well    Behavior During Therapy [facility] for tasks assessed/performed           Past Medical History:  Diagnosis Date   Acute diverticulitis 08/17/2020   Acute MI (HCC) 08/2011   Arthritis    Bilateral pneumonia    Diagnosed after STEMI 08/2011   CAD (coronary artery disease)    a. Diagnosed 08/2011 with anterior STEMI due to thrombotic occlusion of mid LAD s/p thrombectomy, PTCA, DES placement 08/23/11.   CHF (congestive heart failure) (HCC)    Chronic combined systolic and diastolic congestive heart failure (HCC) 09/15/2011   Dyslipidemia    Gout    Hypertension    Ischemic cardiomyopathy    a. Initial EF 35% by cath 08/23/11, improved to 40-45% by echo 08/25/11. 50-55% by echo November 2013.   Rotator cuff tear arthropathy, right    Shortness of breath    Past Surgical History:  Procedure Laterality Date   carpel tunnel Bilateral 2002   COLONOSCOPY WITH PROPOFOL  N/A 04/08/2017   Surgeon: [name], MD;  diverticulosis in sigmoid and descending colon, internal hemorrhoids, otherwise normal exam.  No recommendations to repeat due to age.   COLONOSCOPY WITH PROPOFOL  N/A 03/17/2021   Procedure: COLONOSCOPY WITH PROPOFOL ;  Surgeon: [name], DO;  Location: [facility];  Service: Endoscopy;  Laterality: N/A;  8:00am   CORONARY STENT PLACEMENT     CYSTECTOMY  1969   pilonidal cyst   LEFT AND RIGHT HEART CATHETERIZATION WITH CORONARY ANGIOGRAM N/A 09/15/2011   Procedure: LEFT AND RIGHT HEART CATHETERIZATION WITH CORONARY ANGIOGRAM;  Surgeon: [name], MD;  Location: [facility] CATH LAB;  Service: Cardiovascular;  Laterality: N/A;   LEFT HEART CATH N/A 08/23/2011   Procedure: LEFT HEART CATH;  Surgeon: [name], MD;  Location: [facility] CATH LAB;  Service: Cardiovascular;  Laterality: N/A;   NO PAST SURGERIES     PERCUTANEOUS CORONARY STENT INTERVENTION (PCI-S)  08/23/2011   Procedure: PERCUTANEOUS CORONARY STENT INTERVENTION (PCI-S);  Surgeon: [name], MD;  Location: [facility] CATH LAB;  Service: Cardiovascular;;   Patient Active Problem List   Diagnosis Date Noted   Carbuncle 10/07/2023   Hospital discharge follow-up 10/07/2023   Abnormal LFTs 09/26/2023   Cauda equina compression (HCC) 09/14/2023   Cauda equina syndrome with neurogenic bladder (HCC) 09/12/2023   Lumbar spinal stenosis 09/11/2023  Tendinopathy of right rotator cuff 02/03/2023   Mixed hyperlipidemia 01/23/2022   Encounter for general adult medical examination with abnormal findings 07/18/2021   Prediabetes 07/18/2021   Vitamin D  deficiency 07/18/2021   History of diverticulitis 02/24/2021   Pancreatic lesion 02/24/2021   Prostate cancer screening 01/30/2021   Primary insomnia 01/27/2021   Hepatomegaly    Essential hypertension 08/18/2020   Idiopathic gout 11/16/2018   Neck mass 12/31/2015   Old anteroseptal myocardial infarction 08/24/2011   Coronary artery disease involving native coronary  artery of native heart without angina pectoris 08/24/2011    PCP: [name], MD  REFERRING PROVIDER: Maurice Sharlet RAMAN, PA-C  REFERRING DIAG:  G83.4 (ICD-10-CM) - Cauda equina compression [facility])    Rationale for Evaluation and Treatment: Rehabilitation  THERAPY DIAG:  Muscle weakness  Impaired functional mobility, balance, gait, and endurance  Cauda equina compression (HCC)  ONSET DATE: September 09, 2023  SUBJECTIVE:                                                                                                                                                                                           SUBJECTIVE STATEMENT: Pt reports he's been having some intermittent pain in L leg, just above knee. Feels like a deep ache. Reports if he uses cane he doesn't feel the pain at all, but reports when he doesn't use the cane the pain comes on only sometimes. Reports that the pain goes away pretty quickly with rest.    EVAL: Pt reports he was pain free until August 21st. Reports he woke up the next day with low back and LE pain. Reports he went to ER but they sent him home. N/T started a few days after that and he went back to ER, they sent him to [facility] and he had surgery the next day. Reports he had inpatient rehab for 1 week post-op. Pt reports he had a follow up this morning. Doctor pleased with progress.   PERTINENT HISTORY:  Lumbar decompression, fusion, L4-5 on 8/23 R shoulder Rotator cuff pain  PAIN:  Are you having pain? No  PRECAUTIONS: Back, Pt wearing back brace, Reports under BLT precautions (bending, lifting, twisting)  - Plans to follow up with MD regarding timeline of precautions   RED FLAGS: Reports cont N/T down both LE, most in buttocks    WEIGHT BEARING RESTRICTIONS: No  FALLS:  Has patient fallen in last 6 months? No  LIVING ENVIRONMENT: Lives with: lives with their spouse Lives in: House/apartment Stairs: Yes: Internal: 13 steps; can reach both and  External: 2 steps; none Has following equipment at home: Vannie -  2 wheeled and Shower bench, Back brace  OCCUPATION: N/A  PLOF: Reports bathing at sink, independent with dressing, reports wife doing all cleaning now, can prepare own meals   PATIENT GOALS: For 100% recovery, mobile, active, golf (last played last year)  NEXT MD VISIT: October 17th  OBJECTIVE:  Note: Objective measures were completed at Evaluation unless otherwise noted.  DIAGNOSTIC FINDINGS:  IMPRESSION: 1. Bulky disc extrusion at L4-L5 with very Severe spinal stenosis. Lateral recess stenosis may be greater on the left. Query L5 radiculitis. 2. Superimposed multifactorial Severe spinal stenosis also at L3-L4, and moderate spinal stenosis at L2-L3. Up to moderate bilateral L2 neural foraminal stenosis.  IMPRESSION: Intraoperative L4-L5 posterolateral and interbody surgical hardware.    PATIENT SURVEYS:  Modified Oswestry: Modified Oswestry Low Back Pain Disability Questionnaire: 20 / 50 = 40.0 %  Interpretation of scores: Score Category Description  0-20% Minimal Disability The patient can cope with most living activities. Usually no treatment is indicated apart from advice on lifting, sitting and exercise  21-40% Moderate Disability The patient experiences more pain and difficulty with sitting, lifting and standing. Travel and social life are more difficult and they may be disabled from work. Personal care, sexual activity and sleeping are not grossly affected, and the patient can usually be managed by conservative means  41-60% Severe Disability Pain remains the main problem in this group, but activities of daily living are affected. These patients require a detailed investigation  61-80% Crippled Back pain impinges on all aspects of the patient's life. Positive intervention is required  81-100% Bed-bound  These patients are either bed-bound or exaggerating their symptoms  Bluford FORBES Zoe DELENA Karon DELENA, et  al. Surgery versus conservative management of stable thoracolumbar fracture: the PRESTO feasibility RCT. [place] ([place]): [employer]; 2021 Nov. [facility] Technology Assessment, No. 25.62.) Appendix 3, Oswestry Disability Index category descriptors. Available from: FindJewel[employer]cz  Minimally Clinically Important Difference (MCID) = 12.8%  11/04/23: Modified Oswestry Low Back Pain Disability Questionnaire: 3 / 50 = 6.0 %  LEFS  Extreme difficulty/unable (0), Quite a bit of difficulty (1), Moderate difficulty (2), Little difficulty (3), No difficulty (4) Survey date:    Any of your usual work, housework or school activities   2. Usual hobbies, recreational or sporting activities   3. Getting into/out of the bath   4. Walking between rooms   5. Putting on socks/shoes   6. Squatting    7. Lifting an object, like a bag of groceries from the floor   8. Performing light activities around your home   9. Performing heavy activities around your home   10. Getting into/out of a car   11. Walking 2 blocks   12. Walking 1 mile   13. Going up/down 10 stairs (1 flight)   14. Standing for 1 hour   15.  sitting for 1 hour   16. Running on even ground   17. Running on uneven ground   18. Making sharp turns while running fast   19. Hopping    20. Rolling over in bed   Score total:  Lower Extremity Functional Score: 45 / 80 = 56.3 %     11/04/23: Lower Extremity Functional Score: 74 / 80 = 92.5 %    COGNITION: Overall cognitive status: Within functional limits for tasks assessed     SENSATION: WFL,   POSTURE: rounded shoulders and forward head  PALPATION:  N/A  LUMBAR ROM:   AROM eval  Flexion   Extension  Right lateral flexion   Left lateral flexion   Right rotation   Left rotation    (Blank rows = not tested)  LOWER EXTREMITY ROM:     Active  Right eval Left eval  Hip flexion    Hip extension    Hip abduction    Hip adduction     Hip internal rotation    Hip external rotation    Knee flexion    Knee extension    Ankle dorsiflexion    Ankle plantarflexion    Ankle inversion    Ankle eversion     (Blank rows = not tested)  LOWER EXTREMITY MMT:    MMT Right eval Left eval R 11/04/23: L 11/04/23:  Hip flexion 4 4 4+ 4+  Hip extension 2+ 2+ 3+ 3+  Hip abduction 3 3- 4- 4-  Hip adduction      Hip internal rotation      Hip external rotation      Knee flexion      Knee extension      Ankle dorsiflexion 5 5 5 5   Ankle plantarflexion      Ankle inversion      Ankle eversion       (Blank rows = not tested)  LUMBAR SPECIAL TESTS:    FUNCTIONAL TESTS:  30 seconds chair stand test 2 minute walk test: 321 ft on 10/05/23 w/ RW 30 second chair stand: 6 STS, no UE use  Tandem balance: 26 with RLE leading, 25 with LLE leading SLS: unable with BLE  11/04/23: 30 sec chair stand: 12 STS, no UE use 2 minute walk test: 300 ft, no AD SLS:  7 on LLE     29 on RLE   GAIT: Distance walked: 100 ft in session  Assistive device utilized: Walker - 2 wheeled Level of assistance: Modified independence Comments: Dec speed, ambulates with RW, back brace donned, slight forward posture,   TREATMENT DATE:  11/11/23: Treadmill, 6', starting at 1.1 mph - reaching 1.4 mph, pt self increasing Squats with round tidal tank, 15x Walking mini lunges with round tidal tank, 10 ftx4 CGA Walking mini squats with round tidal tank, 10 ftx4 CGA Semi tandem stance on foam, picking/placing 3 cones from ground level, 1x with each L Leading Forward ambulation with R/L head turns, 20 ftx2 Forward ambulation with vertical head turns, 75ftx4, CGA needed    11/04/23: Bike, seat 14, level 3, 8' while completing LEFS and Mod Oswestry Progress Note: LE MMT 30 sec chair stand test 2 minute walk test SL balance Review of goals Discussion of POC   11/01/23 Nustep UE/LE [place] L5 average speed  5 min, seat 10 Standing inside //  bars:    Standing on Foam toe tapping 12in step 2 x10  Lateral step up to 8 down to foam up to 6 intermittent HHA 10RT  Forward lunges onto BOSU dome up no UE assist 2X10  Vector stance with 1 UE support 5x 5 holds X 2 sets [employer] leg press bilateral LE's 6PL 2X10, Rt only 3Pl 2X10, Lt only 3PL 2X10 SLR max 8 Lt, 10 Rt without UE assist     PATIENT EDUCATION:  Education details: PT evaluation, objective findings, POC, Importance of HEP, Precautions, Clinic policies  Person educated: Patient Education method: Explanation and Demonstration Education comprehension: verbalized understanding and returned demonstration  HOME EXERCISE PROGRAM: Access Code: [email or web address] URL: [email or web address] Date: 10/15/2023 Prepared by: Rosaria Powell-Butler  Exercises - Clamshell  - 2 x daily -  7 x weekly - 3 sets - 10 reps - Supine Bridge  - 2 x daily - 7 x weekly - 3 sets - 10 reps - Seated Long Arc Quad  - 2 x daily - 7 x weekly - 3 sets - 10 reps - Sit to Stand  - 2 x daily - 7 x weekly - 3 sets - 10 reps - Standing Tandem Balance with Counter Support  - 2 x daily - 7 x weekly - 3 sets - 10 reps - 30 hold - Neutral Curl Up with Straight Leg  - 1 x daily - 7 x weekly - 3 sets - 10 reps - 3 hold - Standing Single Leg Stance with Counter Support  - 2 x daily - 7 x weekly - 3 sets - 30 hold - Tandem Walking with Counter Support  - 2 x daily - 7 x weekly - 2 sets - 10 reps   ASSESSMENT:  CLINICAL IMPRESSION: Pt arrives to session with reports of intermittent LLE pain. Reports it as a deep ache that disappears with 2-3 min rest following its onset after ambulation. Educated pt this may be just muscle related. Began with dynamic warm up on treadmill. Pt reaching 1.4 mph. Remainder of session spent on combining LE strengthening with static and dynamic balance. Pt most challenged with dynamic balance during mini lunges with tidal tank and forward ambulation with head turns, requiring CGA  during those activities but no overt LOB noted. Patient reporting after last sessions discussion, he feels ready to discharge to HEP and maintain progress made during PT independently. Reports HEP remains challenging enough. Educated if he should perform any of these new balance activities to have wife next to him and/or at a counter/railing for safety. Pt reports understanding. Pt discharged to HEP this date.      EVAL:  Patient is a 78 y.o. male who was seen today for physical therapy evaluation and treatment for  G83.4 (ICD-10-CM) - Cauda equina compression (HCC)  . On this date, patient demonstrates decreased self perceived function via Modified oswestry as well as LEFS, decreased LE strength, impaired balance, altered gait, and decreased activity tolerance all which may be contributing to patients impaired overall function. Patient reports he's maintaining his no lifting,bending, twisting precautions and wearing brace when OOB. Will follow up with surgeon regarding timeline of precautions and brace wear. Patient will benefit from continued skilled physical therapy in order to address the above/below to improve current level of function for better quality of life.   OBJECTIVE IMPAIRMENTS: Abnormal gait, decreased activity tolerance, decreased balance, decreased endurance, decreased mobility, difficulty walking, decreased ROM, decreased strength, and postural dysfunction.   ACTIVITY LIMITATIONS: lifting, bending, standing, squatting, sleeping, stairs, and transfers  PARTICIPATION LIMITATIONS: meal prep, cleaning, laundry, driving, community activity, and yard work  PERSONAL FACTORS: N/A are also affecting patient's functional outcome.   REHAB POTENTIAL: Good  CLINICAL DECISION MAKING: Stable/uncomplicated  EVALUATION COMPLEXITY: Low   GOALS: Goals reviewed with patient? Yes  SHORT TERM GOALS: Target date: 10/22/23 Patient will be independent with performance of HEP to demonstrate  adequate self management of symptoms.  Baseline:  Goal status: MET  2.   Patient will report at least a 25% improvement with function and/or pain reduction overall since beginning PT. Baseline: reports 80% improved on 11/04/23 Goal status: MET   LONG TERM GOALS: Target date: 11/26/23 Patient will improve LEFS score by 9 points to demonstrate improved perceived function while meeting MCID.  Baseline: Goal  status: MET Patient will improve Modified Oswestry score by 12.8% to demonstrate improved perceived function while meeting MCID.  Baseline: Goal status: MET 3.  Patient will score at least a  4/5  on hip extension and abduction MMT with BLE to demonstrate increased LE strength and/or power needed to improve ambulation/gait mechanics.  Baseline:  Goal status: ADEQUATE FOR DISCHARGE   4.  Patient will increase 2 minute walk test gait distance by at least 40 ft with least restrictive assistive device to demonstrate improved endurance and functional mobility needed for ambulating household and community distances.  Baseline: Goal status: ADEQUATE FOR DISCHARGE 5. Patient will be able to maintain SL balance on each LE for at least 5 to demonstrate improved balance needed to reduce risk of falls.  Baseline: Goal status: MET   PLAN:  PT FREQUENCY: 2x/week  PT DURATION: 8 weeks  PLANNED INTERVENTIONS: 97164- PT Re-evaluation, 97110-Therapeutic exercises, 97530- Therapeutic activity, [id number]- Neuromuscular re-education, 97535- Self Care, [id number]- Manual therapy, [id number]- Gait training, [phone]- Electrical stimulation (manual), [id number]- Traction (mechanical), [phone] (1-2 muscles), 20561 (3+ muscles)- Dry Needling, Patient/Family education, Balance training, Stair training, Taping, Joint mobilization, Spinal mobilization, Cryotherapy, and Moist heat.  PLAN FOR NEXT SESSION: pt discharged   12:33 PM, 11/11/23 Rosaria Settler, PT, DPT [facility]

## 2023-11-17 ENCOUNTER — Ambulatory Visit: Payer: Self-pay | Admitting: Physician Assistant

## 2023-11-17 NOTE — Telephone Encounter (Signed)
 Called pt, no answer lm on vm with details. Per DPR okay. Asked him to call our office and schedule an appt for follow up within next two weeks.

## 2023-11-17 NOTE — Telephone Encounter (Signed)
-----   Message from Maurilio Deland Collet sent at 11/16/2023  8:16 AM EDT ----- Please advise the patient that the blood tests are overall stable compared to prior. Uric acid level is within normal limits. He can stop taking the colchicine.  He does need a wound check up exam in  the next few weeks.   ----- Message ----- From: Rebecka Hose Lab Results In Sent: 10/22/2023   1:36 AM EDT To: Maurilio CHRISTELLA Collet, PA-C

## 2023-11-18 ENCOUNTER — Ambulatory Visit (HOSPITAL_COMMUNITY)

## 2023-11-18 ENCOUNTER — Encounter: Payer: Self-pay | Admitting: Internal Medicine

## 2023-11-22 ENCOUNTER — Encounter: Payer: Self-pay | Admitting: Radiology

## 2023-11-24 ENCOUNTER — Ambulatory Visit (HOSPITAL_COMMUNITY): Admitting: Physical Therapy

## 2023-12-24 ENCOUNTER — Encounter: Payer: Self-pay | Admitting: Internal Medicine

## 2023-12-24 ENCOUNTER — Other Ambulatory Visit: Payer: Self-pay | Admitting: Internal Medicine

## 2023-12-24 DIAGNOSIS — H1033 Unspecified acute conjunctivitis, bilateral: Secondary | ICD-10-CM

## 2023-12-24 MED ORDER — OFLOXACIN 0.3 % OP SOLN: 1.0000 [drp] | Freq: Four times a day (QID) | OPHTHALMIC | 0 refills | Status: AC

## 2023-12-26 ENCOUNTER — Inpatient Hospital Stay: Admission: RE | Admit: 2023-12-26 | Discharge: 2023-12-26 | Payer: Self-pay | Attending: Family Medicine

## 2023-12-26 VITALS — BP 111/71 | HR 81 | Temp 98.9°F | Resp 18

## 2023-12-26 DIAGNOSIS — H109 Unspecified conjunctivitis: Secondary | ICD-10-CM

## 2023-12-26 MED ORDER — ERYTHROMYCIN 5 MG/GM OP OINT: TOPICAL_OINTMENT | OPHTHALMIC | 0 refills | Status: AC

## 2023-12-26 NOTE — ED Triage Notes (Signed)
 Left eye redness and draining x 1 week. Has been using ofloxacin  eye drops with no improvement since Friday.

## 2023-12-26 NOTE — ED Provider Notes (Signed)
 [facility]    CSN: [id number] Arrival date & time: 12/26/23  1240      History   Chief Complaint Chief Complaint  Patient presents with   Eye Problem    Red, discharge - Entered by patient    HPI Austin Bright is a 78 y.o. male.   Patient presenting today with 1 week history of left eye redness, drainage, itching.  Denies fever, chills, headache, nausea, vomiting, injury to the eye, significant visual change apart from mild blurriness due to drainage.  Notes the symptoms are now starting on the right side as well.  Was started 2 days ago on prescription ofloxacin  drops with minimal relief.    Past Medical History:  Diagnosis Date   Acute diverticulitis 08/17/2020   Acute MI (HCC) 08/2011   Arthritis    Bilateral pneumonia    Diagnosed after STEMI 08/2011   CAD (coronary artery disease)    a. Diagnosed 08/2011 with anterior STEMI due to thrombotic occlusion of mid LAD s/p thrombectomy, PTCA, DES placement 08/23/11.   CHF (congestive heart failure) (HCC)    Chronic combined systolic and diastolic congestive heart failure (HCC) 09/15/2011   Dyslipidemia    Gout    Hypertension    Ischemic cardiomyopathy    a. Initial EF 35% by cath 08/23/11, improved to 40-45% by echo 08/25/11. 50-55% by echo November 2013.   Rotator cuff tear arthropathy, right    Shortness of breath     Patient Active Problem List   Diagnosis Date Noted   Carbuncle 10/07/2023   Hospital discharge follow-up 10/07/2023   Abnormal LFTs 09/26/2023   Cauda equina compression (HCC) 09/14/2023   Cauda equina syndrome with neurogenic bladder (HCC) 09/12/2023   Lumbar spinal stenosis 09/11/2023   Tendinopathy of right rotator cuff 02/03/2023   Mixed hyperlipidemia 01/23/2022   Encounter for general adult medical examination with abnormal findings 07/18/2021   Prediabetes 07/18/2021   Vitamin D  deficiency 07/18/2021   History of diverticulitis 02/24/2021   Pancreatic lesion 02/24/2021    Prostate cancer screening 01/30/2021   Primary insomnia 01/27/2021   Hepatomegaly    Essential hypertension 08/18/2020   Idiopathic gout 11/16/2018   Neck mass 12/31/2015   Old anteroseptal myocardial infarction 08/24/2011   Coronary artery disease involving native coronary artery of native heart without angina pectoris 08/24/2011    Past Surgical History:  Procedure Laterality Date   carpel tunnel Bilateral 2002   COLONOSCOPY WITH PROPOFOL  N/A 04/08/2017   Surgeon: [name], MD;  diverticulosis in sigmoid and descending colon, internal hemorrhoids, otherwise normal exam.  No recommendations to repeat due to age.   COLONOSCOPY WITH PROPOFOL  N/A 03/17/2021   Procedure: COLONOSCOPY WITH PROPOFOL ;  Surgeon: [name], DO;  Location: [facility];  Service: Endoscopy;  Laterality: N/A;  8:00am   CORONARY STENT PLACEMENT     CYSTECTOMY  1969   pilonidal cyst   LEFT AND RIGHT HEART CATHETERIZATION WITH CORONARY ANGIOGRAM N/A 09/15/2011   Procedure: LEFT AND RIGHT HEART CATHETERIZATION WITH CORONARY ANGIOGRAM;  Surgeon: [name], MD;  Location: [facility] CATH LAB;  Service: Cardiovascular;  Laterality: N/A;   LEFT HEART CATH N/A 08/23/2011   Procedure: LEFT HEART CATH;  Surgeon: [name], MD;  Location: [facility] CATH LAB;  Service: Cardiovascular;  Laterality: N/A;   NO PAST SURGERIES     PERCUTANEOUS CORONARY STENT INTERVENTION (PCI-S)  08/23/2011   Procedure: PERCUTANEOUS CORONARY STENT INTERVENTION (PCI-S);  Surgeon: [name],  MD;  Location: [facility] CATH LAB;  Service: Cardiovascular;;       Home Medications    Prior to Admission medications   Medication Sig Start Date End Date Taking? Authorizing Provider  erythromycin  ophthalmic ointment Place a 1/2 inch ribbon of ointment into the left lower eyelid BID prn. 12/26/23  Yes Stuart Vernell Norris, PA-C  ofloxacin  (OCUFLOX ) 0.3 % ophthalmic solution Place 1 drop into both eyes 4 (four) times daily. 12/24/23    [name], MD  ascorbic acid  (VITAMIN C ) 1000 MG tablet Take 1 tablet (1,000 mg total) by mouth daily after supper. 09/22/23   Love, Sharlet RAMAN, PA-C  aspirin  EC 81 MG tablet Take 81 mg by mouth daily.    [provider]  colchicine  0.6 MG tablet Take 1 tablet (0.6 mg total) by mouth daily. 10/21/23   Gerome Maurilio HERO, PA-C  ezetimibe  (ZETIA ) 10 MG tablet TAKE ONE TABLET BY MOUTH EVERY DAY 07/19/23   Alvan Dorn FALCON, MD  lisinopril  (ZESTRIL ) 20 MG tablet Take 1 tablet (20 mg total) by mouth daily. 10/07/23   [name], MD  metoprolol  succinate (TOPROL -XL) 25 MG 24 hr tablet TAKE 1 TABLET BY MOUTH ONCE DAILY. Patient taking differently: Take 25 mg by mouth at bedtime. 02/08/23   Alvan Dorn FALCON, MD  nitroGLYCERIN  (NITROSTAT ) 0.4 MG SL tablet PLACE 1 TAB UNDER TONGUE EVERY 5 MIN IF NEEDED FOR CHEST PAIN. MAY USE 3 TIMES.NO RELIEF CALL 911. 09/27/20   Marylu Leita SAUNDERS, NP  rosuvastatin  (CRESTOR ) 40 MG tablet Take 1 tablet (40 mg total) by mouth daily. Patient taking differently: Take 40 mg by mouth at bedtime. 02/03/23   [name], MD  traZODone  (DESYREL ) 100 MG tablet Take 1 tablet (100 mg total) by mouth at bedtime as needed for sleep. 11/01/23   [name], MD    Family History Family History  Problem Relation Age of Onset   Stroke Mother    Hypertension Father    Colon cancer Neg Hx    Pancreatic cancer Neg Hx     Social History Social History   Tobacco Use   Smoking status: Former    Current packs/day: 0.00    Average packs/day: 1 pack/day for 25.0 years (25.0 ttl pk-yrs)    Types: Cigarettes    Start date: 08/23/1954    Quit date: 08/23/1979    Years since quitting: 44.3    Passive exposure: Past   Smokeless tobacco: Never  Vaping Use   Vaping status: Some Days   Substances: Nicotine  Substance Use Topics   Alcohol use: Not Currently    Alcohol/week: 4.0 standard drinks of alcohol    Types: 2 Cans of beer, 2 Shots of liquor per week    Comment: 1-2  drinks on the weekends   Drug use: No     Allergies   Flonase  [fluticasone  propionate]   Review of Systems Review of Systems PER HPI  Physical Exam Triage Vital Signs ED Triage Vitals  Encounter Vitals Group     BP 12/26/23 1306 111/71     Girls Systolic BP Percentile --      Girls Diastolic BP Percentile --      Boys Systolic BP Percentile --      Boys Diastolic BP Percentile --      Pulse Rate 12/26/23 1306 81     Resp 12/26/23 1306 18     Temp 12/26/23 1306 98.9 F (37.2 C)     Temp  Source 12/26/23 1306 Oral     SpO2 12/26/23 1306 96 %     Weight --      Height --      Head Circumference --      Peak Flow --      Pain Score 12/26/23 1307 2     Pain Loc --      Pain Education --      Exclude from Growth Chart --    No data found.  Updated Vital Signs BP 111/71 (BP Location: Right Arm)   Pulse 81   Temp 98.9 F (37.2 C) (Oral)   Resp 18   SpO2 96%   Visual Acuity Right Eye Distance: 20/50 Left Eye Distance: 20/40 Bilateral Distance: 20/40  Right Eye Near:   Left Eye Near:    Bilateral Near:     Physical Exam Vitals and nursing note reviewed.  Constitutional:      Appearance: Normal appearance.  HENT:     Head: Atraumatic.     Mouth/Throat:     Mouth: Mucous membranes are moist.  Eyes:     Extraocular Movements: Extraocular movements intact.     Pupils: Pupils are equal, round, and reactive to light.     Comments: Significant conjunctival erythema, thick drainage to the left eye, minimal conjunctival injection and erythema to the right eye  Cardiovascular:     Rate and Rhythm: Normal rate.  Pulmonary:     Effort: Pulmonary effort is normal.  Musculoskeletal:        General: Normal range of motion.     Cervical back: Normal range of motion and neck supple.  Skin:    General: Skin is warm and dry.  Neurological:     Mental Status: He is oriented to person, place, and time.  Psychiatric:        Mood and Affect: Mood normal.        Thought  Content: Thought content normal.        Judgment: Judgment normal.      UC Treatments / Results  Labs (all labs ordered are listed, but only abnormal results are displayed) Labs Reviewed - No data to display  EKG   Radiology No results found.  Procedures Procedures (including critical care time)  Medications Ordered in UC Medications - No data to display  Initial Impression / Assessment and Plan / UC Course  I have reviewed the triage vital signs and the nursing notes.  Pertinent labs & imaging results that were available during my care of the patient were reviewed by me and considered in my medical decision making (see chart for details).     Vitals and exam reassuring, visual acuity without concerning features.  Will switch from the ofloxacin  drops that he is already on to erythromycin  ointment and discussed warm compresses, good hand hygiene, ophthalmology follow-up if worsening or not resolving.  Final Clinical Impressions(s) / UC Diagnoses   Final diagnoses:  Bacterial conjunctivitis   Discharge Instructions   None    ED Prescriptions     Medication Sig Dispense Auth. Provider   erythromycin  ophthalmic ointment Place a 1/2 inch ribbon of ointment into the left lower eyelid BID prn. 3.5 g Stuart Vernell Norris, PA-C      PDMP not reviewed this encounter.   Stuart Vernell Norris, [place] 12/26/23 1327

## 2023-12-30 DIAGNOSIS — G834 Cauda equina syndrome: Secondary | ICD-10-CM | POA: Diagnosis not present

## 2023-12-30 DIAGNOSIS — Z6829 Body mass index (BMI) 29.0-29.9, adult: Secondary | ICD-10-CM | POA: Diagnosis not present

## 2024-01-03 ENCOUNTER — Encounter: Payer: Self-pay | Admitting: Internal Medicine

## 2024-01-03 ENCOUNTER — Other Ambulatory Visit: Payer: Self-pay | Admitting: Internal Medicine

## 2024-01-03 DIAGNOSIS — H10403 Unspecified chronic conjunctivitis, bilateral: Secondary | ICD-10-CM

## 2024-01-03 DIAGNOSIS — H538 Other visual disturbances: Secondary | ICD-10-CM

## 2024-01-10 ENCOUNTER — Other Ambulatory Visit (HOSPITAL_COMMUNITY): Payer: Self-pay

## 2024-01-14 ENCOUNTER — Other Ambulatory Visit (HOSPITAL_COMMUNITY): Payer: Self-pay

## 2024-01-14 ENCOUNTER — Telehealth: Payer: Self-pay | Admitting: Pharmacy Technician

## 2024-01-14 NOTE — Telephone Encounter (Signed)
 Pharmacy Patient Advocate Encounter  Received notification from [employer] that Prior Authorization for Wegovy  0.25MG /0.5ML auto-injectors has been APPROVED from 10/20/2023 to 01/18/2025.   PA #/Case ID/Reference #: [id number]

## 2024-01-17 ENCOUNTER — Encounter: Payer: Self-pay | Admitting: Internal Medicine

## 2024-01-17 NOTE — Addendum Note (Signed)
 Addended byBETHA TOBIE DOWNS on: 01/17/2024 03:31 PM   Modules accepted: Orders

## 2024-01-21 LAB — OPHTHALMOLOGY REPORT-SCANNED

## 2024-02-07 ENCOUNTER — Ambulatory Visit: Admitting: Internal Medicine

## 2024-02-19 ENCOUNTER — Other Ambulatory Visit: Payer: Self-pay | Admitting: Internal Medicine

## 2024-02-19 DIAGNOSIS — E782 Mixed hyperlipidemia: Secondary | ICD-10-CM

## 2024-02-24 ENCOUNTER — Encounter: Payer: Self-pay | Admitting: Internal Medicine

## 2024-02-24 ENCOUNTER — Ambulatory Visit: Admitting: Internal Medicine

## 2024-02-24 VITALS — BP 108/63 | HR 92 | Resp 16 | Ht 70.0 in | Wt 212.4 lb

## 2024-02-24 DIAGNOSIS — Z0001 Encounter for general adult medical examination with abnormal findings: Secondary | ICD-10-CM

## 2024-02-24 DIAGNOSIS — I2510 Atherosclerotic heart disease of native coronary artery without angina pectoris: Secondary | ICD-10-CM

## 2024-02-24 DIAGNOSIS — E559 Vitamin D deficiency, unspecified: Secondary | ICD-10-CM

## 2024-02-24 DIAGNOSIS — R7303 Prediabetes: Secondary | ICD-10-CM

## 2024-02-24 DIAGNOSIS — Z125 Encounter for screening for malignant neoplasm of prostate: Secondary | ICD-10-CM

## 2024-02-24 DIAGNOSIS — E782 Mixed hyperlipidemia: Secondary | ICD-10-CM

## 2024-02-24 DIAGNOSIS — I10 Essential (primary) hypertension: Secondary | ICD-10-CM

## 2024-02-24 NOTE — Assessment & Plan Note (Addendum)
 S/p stent placement On Aspirin , statin and Zetia  On Metoprolol  and Lisinopril  Had ischemic CM, but LVEF has improved now. Followed by Cardiology  Due to his history of CAD, HTN and obesity, he would benefit from GLP-1 agonist therapy to reduce his cardiovascular risk - had started Wegovy  previously

## 2024-02-24 NOTE — Patient Instructions (Addendum)
 Please continue to take medications as prescribed.  Please continue to follow low carb diet and perform moderate exercise/walking at least 150 mins/week.  Please get fasting blood tests done within a week.  Please consider getting Shingrix and Tdap vaccine at local pharmacy.

## 2024-02-24 NOTE — Assessment & Plan Note (Signed)
 Physical exam as documented. Counseling done  re healthy lifestyle involving commitment to 150 minutes exercise per week, heart healthy diet, and attaining healthy weight.The importance of adequate sleep also discussed. Immunization and cancer screening needs are specifically addressed at this visit.

## 2024-02-24 NOTE — Assessment & Plan Note (Signed)
 Ordered PSA after discussing its limitations for prostate cancer screening, including false positive results leading to additional investigations.

## 2024-02-24 NOTE — Assessment & Plan Note (Signed)
Advised to take Vitamin D 2000 IU QD °

## 2024-02-24 NOTE — Assessment & Plan Note (Addendum)
 BP Readings from Last 1 Encounters:  02/24/24 108/63   Well-controlled with Lisinopril  20 mg QD and Metoprolol  25 mg QD Counseled for compliance with the medications Advised DASH diet and moderate exercise/walking, at least 150 mins/week

## 2024-02-24 NOTE — Assessment & Plan Note (Signed)
 Lab Results  Component Value Date   HGBA1C 6.2 (H) 08/02/2023   Advised to follow DASH diet for now

## 2024-02-24 NOTE — Progress Notes (Signed)
 "  Established Patient Office Visit  Subjective:  Patient ID: Austin Bright, male    DOB: Jan 30, 1945  Age: 79 y.o. MRN: [id number]  CC:  Chief Complaint  Patient presents with   Hypertension    Follow up visit      HPI Austin Bright is a 79 y.o. male with past medical history of CAD s/p stent placement, HTN, HLD, gout and diverticulitis who presents for f/u of his chronic medical conditions.  HTN and CAD: BP is well-controlled. Takes medications regularly. Patient denies headache, dizziness, chest pain, dyspnea or palpitations.  He follows up with cardiology.  He has a history of stent placement, and takes aspirin  and statin.  He also takes metoprolol  and lisinopril .  History of cauda equina syndrome: He was admitted for concern for cauda equina syndrome. Patient underwent an L4-5 decompression and fusion by Dr. Mavis on 09/11/2023. He was later discharged to [facility] for PT. He reports sufficient recovery now.   Past Medical History:  Diagnosis Date   Acute diverticulitis 08/17/2020   Acute MI (HCC) 08/2011   Arthritis    Bilateral pneumonia    Diagnosed after STEMI 08/2011   CAD (coronary artery disease)    a. Diagnosed 08/2011 with anterior STEMI due to thrombotic occlusion of mid LAD s/p thrombectomy, PTCA, DES placement 08/23/11.   CHF (congestive heart failure) (HCC)    Chronic combined systolic and diastolic congestive heart failure (HCC) 09/15/2011   Dyslipidemia    Gout    Hypertension    Ischemic cardiomyopathy    a. Initial EF 35% by cath 08/23/11, improved to 40-45% by echo 08/25/11. 50-55% by echo November 2013.   Rotator cuff tear arthropathy, right    Shortness of breath     Past Surgical History:  Procedure Laterality Date   carpel tunnel Bilateral 2002   COLONOSCOPY WITH PROPOFOL  N/A 04/08/2017   Surgeon: [name], MD;  diverticulosis in sigmoid and descending colon, internal hemorrhoids, otherwise normal exam.  No  recommendations to repeat due to age.   COLONOSCOPY WITH PROPOFOL  N/A 03/17/2021   Procedure: COLONOSCOPY WITH PROPOFOL ;  Surgeon: [name], DO;  Location: [facility];  Service: Endoscopy;  Laterality: N/A;  8:00am   CORONARY STENT PLACEMENT     CYSTECTOMY  1969   pilonidal cyst   LEFT AND RIGHT HEART CATHETERIZATION WITH CORONARY ANGIOGRAM N/A 09/15/2011   Procedure: LEFT AND RIGHT HEART CATHETERIZATION WITH CORONARY ANGIOGRAM;  Surgeon: [name], MD;  Location: [facility] CATH LAB;  Service: Cardiovascular;  Laterality: N/A;   LEFT HEART CATH N/A 08/23/2011   Procedure: LEFT HEART CATH;  Surgeon: [name], MD;  Location: [facility] CATH LAB;  Service: Cardiovascular;  Laterality: N/A;   NO PAST SURGERIES     PERCUTANEOUS CORONARY STENT INTERVENTION (PCI-S)  08/23/2011   Procedure: PERCUTANEOUS CORONARY STENT INTERVENTION (PCI-S);  Surgeon: [name], MD;  Location: [facility] CATH LAB;  Service: Cardiovascular;;    Family History  Problem Relation Age of Onset   Stroke Mother    Hypertension Father    Colon cancer Neg Hx    Pancreatic cancer Neg Hx     Social History   Socioeconomic History   Marital status: Married    Spouse name: Not on file   Number of children: Not on file   Years of education: Not on file   Highest education level: Master's degree (e.g., MA, MS, MEng, MEd, MSW, MBA)  Occupational  History   Occupation: Child Psychotherapist  Tobacco Use   Smoking status: Former    Current packs/day: 0.00    Average packs/day: 1 pack/day for 25.0 years (25.0 ttl pk-yrs)    Types: Cigarettes    Start date: 08/23/1954    Quit date: 08/23/1979    Years since quitting: 44.5    Passive exposure: Past   Smokeless tobacco: Never  Vaping Use   Vaping status: Some Days   Substances: Nicotine  Substance and Sexual Activity   Alcohol use: Not Currently    Alcohol/week: 4.0 standard drinks of alcohol    Types: 2 Cans of beer, 2 Shots of liquor per week    Comment: 1-2  drinks on the weekends   Drug use: No   Sexual activity: Yes    Birth control/protection: Surgical  Other Topics Concern   Not on file  Social History Narrative   Widower since 2017,married for 10 years.Lives alone,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      11/09/2022 Remarried. Wifes name is [name].    Social Drivers of Health   Tobacco Use: Medium Risk (02/24/2024)   Patient History    Smoking Tobacco Use: Former    Smokeless Tobacco Use: Never    Passive Exposure: Past  Physicist, Medical Strain: Low Risk (02/20/2024)   Overall Financial Resource Strain (CARDIA)    Difficulty of Paying Living Expenses: Not hard at all  Food Insecurity: No Food Insecurity (02/20/2024)   Epic    Worried About Programme Researcher, Broadcasting/film/video in the Last Year: Never true    Ran Out of Food in the Last Year: Never true  Transportation Needs: No Transportation Needs (02/20/2024)   Epic    Lack of Transportation (Medical): No    Lack of Transportation (Non-Medical): No  Physical Activity: Insufficiently Active (02/20/2024)   Exercise Vital Sign    Days of Exercise per Week: 5 days    Minutes of Exercise per Session: 20 min  Stress: No Stress Concern Present (02/20/2024)   [employer] of Occupational Health - Occupational Stress Questionnaire    Feeling of Stress: Not at all  Social Connections: Unknown (02/20/2024)   Social Connection and Isolation Panel    Frequency of Communication with Friends and Family: Twice a week    Frequency of Social Gatherings with Friends and Family: Patient declined    Attends Religious Services: Never    Database Administrator or Organizations: Yes    Attends Banker Meetings: 1 to 4 times per year    Marital Status: Married  Catering Manager Violence: Not At Risk (11/10/2023)   Epic    Fear of Current or Ex-Partner: No    Emotionally Abused: No    Physically Abused: No    Sexually Abused: No  Depression (PHQ2-9): Low Risk  (02/24/2024)   Depression (PHQ2-9)    PHQ-2 Score: 0  Alcohol Screen: Low Risk (02/20/2024)   Alcohol Screen    Last Alcohol Screening Score (AUDIT): 4  Housing: Low Risk (02/20/2024)   Epic    Unable to Pay for Housing in the Last Year: No    Number of Times Moved in the Last Year: 0    Homeless in the Last Year: No  Utilities: Not At Risk (11/10/2023)   Epic    Threatened with loss of utilities: No  Health Literacy: Adequate Health Literacy (11/10/2023)   B1300 Health Literacy    Frequency of need for help with medical instructions:  Never    Outpatient Medications Prior to Visit  Medication Sig Dispense Refill   ascorbic acid  (VITAMIN C ) 1000 MG tablet Take 1 tablet (1,000 mg total) by mouth daily after supper.     aspirin  EC 81 MG tablet Take 81 mg by mouth daily.     erythromycin  ophthalmic ointment Place a 1/2 inch ribbon of ointment into the left lower eyelid BID prn. 3.5 g 0   ezetimibe  (ZETIA ) 10 MG tablet TAKE ONE TABLET BY MOUTH EVERY DAY 90 tablet 3   lisinopril  (ZESTRIL ) 20 MG tablet Take 1 tablet (20 mg total) by mouth daily. 90 tablet 3   metoprolol  succinate (TOPROL -XL) 25 MG 24 hr tablet TAKE 1 TABLET BY MOUTH ONCE DAILY. (Patient taking differently: Take 25 mg by mouth at bedtime.) 90 tablet 3   nitroGLYCERIN  (NITROSTAT ) 0.4 MG SL tablet PLACE 1 TAB UNDER TONGUE EVERY 5 MIN IF NEEDED FOR CHEST PAIN. MAY USE 3 TIMES.NO RELIEF CALL 911. 25 tablet 3   ofloxacin  (OCUFLOX ) 0.3 % ophthalmic solution Place 1 drop into both eyes 4 (four) times daily. 5 mL 0   rosuvastatin  (CRESTOR ) 40 MG tablet Take 1 tablet (40 mg total) by mouth daily. 90 tablet 3   traZODone  (DESYREL ) 100 MG tablet Take 1 tablet (100 mg total) by mouth at bedtime as needed for sleep. 30 tablet 3   colchicine  0.6 MG tablet Take 1 tablet (0.6 mg total) by mouth daily. 30 tablet 3   No facility-administered medications prior to visit.    Allergies  Allergen Reactions   Flonase  [Fluticasone  Propionate] Other  (See Comments)    Epistaxis     ROS Review of Systems  Constitutional:  Negative for chills and fever.  HENT:  Negative for congestion and sore throat.   Eyes:  Negative for pain and discharge.  Respiratory:  Negative for cough and shortness of breath.   Cardiovascular:  Negative for chest pain and palpitations.  Gastrointestinal:  Negative for diarrhea, nausea and vomiting.  Endocrine: Negative for polydipsia and polyuria.  Genitourinary:  Negative for dysuria and hematuria.  Musculoskeletal:  Negative for neck pain and neck stiffness.  Skin:  Negative for rash.  Neurological:  Negative for dizziness, weakness, numbness and headaches.  Psychiatric/Behavioral:  Negative for agitation and behavioral problems.       Objective:    Physical Exam Vitals reviewed.  Constitutional:      General: He is not in acute distress.    Appearance: He is not diaphoretic.  HENT:     Head: Normocephalic and atraumatic.     Nose: Nose normal.     Mouth/Throat:     Mouth: Mucous membranes are moist.  Eyes:     General: No scleral icterus.    Extraocular Movements: Extraocular movements intact.  Cardiovascular:     Rate and Rhythm: Normal rate and regular rhythm.     Heart sounds: Normal heart sounds. No murmur heard. Pulmonary:     Breath sounds: Normal breath sounds. No wheezing or rales.  Abdominal:     Palpations: Abdomen is soft.     Tenderness: There is no abdominal tenderness.  Musculoskeletal:     Right shoulder: No tenderness. Normal range of motion.     Cervical back: Neck supple. No tenderness.     Right lower leg: No edema.     Left lower leg: No edema.  Skin:    General: Skin is warm.     Findings: No rash.  Neurological:  General: No focal deficit present.     Mental Status: He is alert and oriented to person, place, and time.     Cranial Nerves: No cranial nerve deficit.     Sensory: No sensory deficit.     Motor: No weakness.  Psychiatric:        Mood and  Affect: Mood normal.        Behavior: Behavior normal.     BP 108/63   Pulse 92   Resp 16   Ht 5' 10 (1.778 m)   Wt 212 lb 6.4 oz (96.3 kg)   SpO2 95%   BMI 30.48 kg/m  Wt Readings from Last 3 Encounters:  02/24/24 212 lb 6.4 oz (96.3 kg)  11/10/23 204 lb (92.5 kg)  11/05/23 210 lb (95.3 kg)    Lab Results  Component Value Date   TSH 1.140 08/02/2023   Lab Results  Component Value Date   WBC 8.4 10/07/2023   HGB 14.0 10/07/2023   HCT 43.0 10/07/2023   MCV 97 10/07/2023   PLT 195 10/07/2023   Lab Results  Component Value Date   NA CANCELED 10/07/2023   K CANCELED 10/07/2023   CO2 CANCELED 10/07/2023   GLUCOSE CANCELED 10/07/2023   BUN CANCELED 10/07/2023   CREATININE CANCELED 10/07/2023   BILITOT CANCELED 10/07/2023   ALKPHOS CANCELED 10/07/2023   AST CANCELED 10/07/2023   ALT CANCELED 10/07/2023   PROT CANCELED 10/07/2023   ALBUMIN CANCELED 10/07/2023   CALCIUM  CANCELED 10/07/2023   ANIONGAP 13 09/23/2023   EGFR 61 08/02/2023   Lab Results  Component Value Date   CHOL 117 08/02/2023   Lab Results  Component Value Date   HDL 47 08/02/2023   Lab Results  Component Value Date   LDLCALC 52 08/02/2023   Lab Results  Component Value Date   TRIG 95 08/02/2023   Lab Results  Component Value Date   CHOLHDL 2.5 08/02/2023   Lab Results  Component Value Date   HGBA1C 6.2 (H) 08/02/2023      Assessment & Plan:   Problem List Items Addressed This Visit       Cardiovascular and Mediastinum   Coronary artery disease involving native coronary artery of native heart without angina pectoris   S/p stent placement On Aspirin , statin and Zetia  On Metoprolol  and Lisinopril  Had ischemic CM, but LVEF has improved now. Followed by Cardiology  Due to his history of CAD, HTN and obesity, he would benefit from GLP-1 agonist therapy to reduce his cardiovascular risk - had started Wegovy  previously      Essential hypertension   BP Readings from Last 1  Encounters:  02/24/24 108/63   Well-controlled with Lisinopril  20 mg QD and Metoprolol  25 mg QD Counseled for compliance with the medications Advised DASH diet and moderate exercise/walking, at least 150 mins/week      Relevant Orders   TSH   CMP14+EGFR   CBC with Differential/Platelet     Other   Prostate cancer screening   Ordered PSA after discussing its limitations for prostate cancer screening, including false positive results leading to additional investigations.      Relevant Orders   PSA   Encounter for general adult medical examination with abnormal findings - Primary   Physical exam as documented. Counseling done  re healthy lifestyle involving commitment to 150 minutes exercise per week, heart healthy diet, and attaining healthy weight.The importance of adequate sleep also discussed. Immunization and cancer screening needs are specifically addressed at this  visit.      Prediabetes   Lab Results  Component Value Date   HGBA1C 6.2 (H) 08/02/2023   Advised to follow DASH diet for now      Relevant Orders   Hemoglobin A1c   CMP14+EGFR   Vitamin D  deficiency   Advised to take Vitamin D  2000 IU QD      Relevant Orders   VITAMIN D  25 Hydroxy (Vit-D Deficiency, Fractures)   Mixed hyperlipidemia   On Crestor  and Zetia       Relevant Orders   Lipid panel      No orders of the defined types were placed in this encounter.   Follow-up: Return in about 6 months (around 08/23/2024).    Suzzane MARLA Blanch, MD "

## 2024-02-24 NOTE — Assessment & Plan Note (Signed)
On Crestor and Zetia

## 2024-02-25 ENCOUNTER — Encounter: Payer: Self-pay | Admitting: Internal Medicine

## 2024-08-23 ENCOUNTER — Ambulatory Visit: Payer: Self-pay | Admitting: Internal Medicine

## 2024-11-14 ENCOUNTER — Ambulatory Visit
